# Patient Record
Sex: Female | Born: 1955
Health system: Northeastern US, Community
[De-identification: ages and names within clinical notes are randomized; demographics above are authoritative.]

## PROBLEM LIST (undated history)

## (undated) VITALS — BP 172/88 | HR 72 | Temp 97.8°F | Resp 13

## (undated) DIAGNOSIS — R922 Inconclusive mammogram: Secondary | ICD-10-CM

## (undated) DIAGNOSIS — J984 Other disorders of lung: Secondary | ICD-10-CM

## (undated) DIAGNOSIS — N912 Amenorrhea, unspecified: Secondary | ICD-10-CM

## (undated) DIAGNOSIS — E559 Vitamin D deficiency, unspecified: Secondary | ICD-10-CM

## (undated) DIAGNOSIS — Z136 Encounter for screening for cardiovascular disorders: Secondary | ICD-10-CM

## (undated) DIAGNOSIS — G4700 Insomnia, unspecified: Secondary | ICD-10-CM

## (undated) DIAGNOSIS — E669 Obesity, unspecified: Secondary | ICD-10-CM

## (undated) DIAGNOSIS — R197 Diarrhea, unspecified: Secondary | ICD-10-CM

## (undated) DIAGNOSIS — R195 Other fecal abnormalities: Secondary | ICD-10-CM

## (undated) DIAGNOSIS — F330 Major depressive disorder, recurrent, mild: Secondary | ICD-10-CM

## (undated) DIAGNOSIS — R05 Cough: Secondary | ICD-10-CM

## (undated) DIAGNOSIS — Z87891 Personal history of nicotine dependence: Secondary | ICD-10-CM

## (undated) DIAGNOSIS — K2970 Gastritis, unspecified, without bleeding: Secondary | ICD-10-CM

## (undated) DIAGNOSIS — Z9119 Patient's noncompliance with other medical treatment and regimen: Secondary | ICD-10-CM

## (undated) DIAGNOSIS — M545 Low back pain: Secondary | ICD-10-CM

## (undated) DIAGNOSIS — F341 Dysthymic disorder: Secondary | ICD-10-CM

## (undated) HISTORY — DX: Personal history of nicotine dependence: Z87.891

## (undated) HISTORY — DX: Major depressive disorder, recurrent, mild: F33.0

## (undated) HISTORY — DX: Low back pain: M54.5

## (undated) HISTORY — PX: TONSILLECTOMY ONE-HALF <AGE 12: ENT170

## (undated) HISTORY — DX: Cough: R05

## (undated) HISTORY — DX: Inconclusive mammogram: R92.2

## (undated) HISTORY — DX: Other fecal abnormalities: R19.5

## (undated) HISTORY — DX: Diarrhea, unspecified: R19.7

## (undated) HISTORY — DX: Dysthymic disorder: F34.1

## (undated) HISTORY — DX: Insomnia, unspecified: G47.00

## (undated) HISTORY — DX: Amenorrhea, unspecified: N91.2

## (undated) HISTORY — DX: Encounter for screening for cardiovascular disorders: Z13.6

## (undated) HISTORY — DX: Gastritis, unspecified, without bleeding: K29.70

## (undated) HISTORY — DX: Other disorders of lung: J98.4

## (undated) HISTORY — DX: Vitamin D deficiency, unspecified: E55.9

## (undated) HISTORY — DX: Patient's noncompliance with other medical treatment and regimen: Z91.19

## (undated) HISTORY — DX: Obesity, unspecified: E66.9

---

## 1999-04-18 HISTORY — PX: SALPINGO-OOPHORECTOMY COMPL/PRTL UNI/BI SPX: REP215

## 1999-04-18 HISTORY — PX: TOTAL ABDOMINAL HYSTERECT W/WO RMVL TUBE OVARY: REP152

## 2001-07-31 ENCOUNTER — Ambulatory Visit: Payer: Self-pay | Admitting: Internal Medicine

## 2001-08-01 LAB — ~~LOC~~-PROGRESS/CLIN NOTE

## 2001-08-06 ENCOUNTER — Other Ambulatory Visit: Payer: Self-pay | Admitting: Internal Medicine

## 2002-02-10 ENCOUNTER — Emergency Department (HOSPITAL_BASED_OUTPATIENT_CLINIC_OR_DEPARTMENT_OTHER): Payer: Self-pay | Admitting: Emergency Medicine

## 2002-02-10 DIAGNOSIS — M545 Low back pain, unspecified: Secondary | ICD-10-CM

## 2002-04-29 ENCOUNTER — Ambulatory Visit: Payer: Self-pay | Admitting: Internal Medicine

## 2002-04-30 ENCOUNTER — Ambulatory Visit: Payer: Self-pay | Admitting: Internal Medicine

## 2002-04-30 ENCOUNTER — Other Ambulatory Visit: Payer: Self-pay | Admitting: Internal Medicine

## 2002-04-30 LAB — ~~LOC~~-PROGRESS/CLIN NOTE

## 2002-05-26 ENCOUNTER — Ambulatory Visit (HOSPITAL_BASED_OUTPATIENT_CLINIC_OR_DEPARTMENT_OTHER): Payer: Self-pay | Admitting: Ophthalmology

## 2002-06-09 ENCOUNTER — Ambulatory Visit: Payer: Self-pay | Admitting: Internal Medicine

## 2002-06-09 LAB — CYTOPATH, C/V, THIN LAYER

## 2002-07-23 ENCOUNTER — Encounter: Payer: Self-pay | Admitting: Internal Medicine

## 2002-09-22 ENCOUNTER — Other Ambulatory Visit: Payer: Self-pay | Admitting: Internal Medicine

## 2002-09-22 DIAGNOSIS — Z1231 Encounter for screening mammogram for malignant neoplasm of breast: Secondary | ICD-10-CM

## 2002-09-22 DIAGNOSIS — R928 Other abnormal and inconclusive findings on diagnostic imaging of breast: Secondary | ICD-10-CM

## 2002-09-24 LAB — MA SCREENING MAMMO BILATERAL WITH CAD

## 2002-10-17 ENCOUNTER — Other Ambulatory Visit: Payer: Self-pay | Admitting: Internal Medicine

## 2002-10-17 DIAGNOSIS — Z09 Encounter for follow-up examination after completed treatment for conditions other than malignant neoplasm: Secondary | ICD-10-CM

## 2002-10-17 DIAGNOSIS — R928 Other abnormal and inconclusive findings on diagnostic imaging of breast: Secondary | ICD-10-CM

## 2002-10-20 LAB — MA DIAGNOSTIC MAMMO UNILATERAL RIGHT WITH CAD

## 2002-12-22 ENCOUNTER — Ambulatory Visit: Payer: Self-pay | Admitting: Internal Medicine

## 2003-01-09 ENCOUNTER — Other Ambulatory Visit: Payer: Self-pay | Admitting: Internal Medicine

## 2003-01-09 LAB — POTASSIUM: POTASSIUM: 3.5 mEQ/L (ref 3.5–5.0)

## 2003-01-09 LAB — CHG CREATININE BLOOD: CREATININE: 0.6 mg/dl — ABNORMAL LOW (ref 0.8–1.2)

## 2003-01-12 ENCOUNTER — Ambulatory Visit: Payer: Self-pay | Admitting: Internal Medicine

## 2003-01-22 ENCOUNTER — Emergency Department (HOSPITAL_BASED_OUTPATIENT_CLINIC_OR_DEPARTMENT_OTHER): Payer: Self-pay | Admitting: Emergency Medicine

## 2003-01-24 LAB — BASIC METABOLIC PANEL FASTING
BUN (UREA NITROGEN): 11 mg/dl (ref 10–20)
CALCIUM: 9.7 mg/dl (ref 8.5–10.5)
CARBON DIOXIDE: 31 mEQ/L (ref 22–32)
CHLORIDE: 106 mEQ/L (ref 98–110)
CREATININE: 0.7 mg/dl — ABNORMAL LOW (ref 0.8–1.2)
GLUCOSE FASTING: 88 mg/dl (ref 65–110)
POTASSIUM: 4.8 mEQ/L (ref 3.5–5.0)
SODIUM: 142 mEQ/L (ref 135–145)

## 2003-01-24 LAB — URINALYSIS
BILIRUBIN, URINE: NEGATIVE
GLUCOSE, URINE: NEGATIVE MG/DL
KETONE, URINE: NEGATIVE MG/DL
LEUKOCYTE ESTERASE: NEGATIVE
NITRITE, URINE: NEGATIVE
OCCULT BLOOD, URINE: NEGATIVE
PH URINE: 6 (ref 5.0–8.0)
PROTEIN, URINE: NEGATIVE MG/DL
SPECIFIC GRAVITY URINE: 1.025 (ref 1.003–1.035)

## 2003-01-24 LAB — COMPLETE BLOOD COUNT
HEMATOCRIT: 36.3 % — ABNORMAL LOW (ref 37.0–47.0)
HEMOGLOBIN: 11.9 g/dL — ABNORMAL LOW (ref 12.0–16.0)
MEAN CORP HGB CONC: 32.9 g/dL (ref 32.0–36.0)
MEAN CORPUSCULAR HGB: 27.2 pg (ref 27.0–31.0)
MEAN CORPUSCULAR VOL: 82.9 fL (ref 81.0–99.0)
RBC DISTRIBUTION WIDTH: 13.5 % (ref 11.5–14.3)
RED BLOOD CELL COUNT: 4.38 MIL/uL (ref 4.20–5.40)
WHITE BLOOD CELL COUNT: 5.1 10*3/uL (ref 4.8–10.8)

## 2003-01-24 LAB — THYROID SCREEN TSH REFLEX FT4: THYROID SCREEN TSH REFLEX FT4: 1.33 u[IU]/mL (ref 0.34–5.60)

## 2003-01-24 LAB — HEMATOCRIT: HEMATOCRIT: 37.5 % (ref 37.0–47.0)

## 2003-01-24 LAB — CHG LIPID PANEL
Cholesterol: 189 mg/dl (ref ?–200)
HIGH DENSITY LIPOPROTEIN: 43 mg/dl (ref 35–95)
LOW DENSITY LIPOPROTEIN DIRECT: 121 mg/dl (ref ?–130)
RISK FACTOR: 4.4 (ref ?–4.4)
TRIGLYCERIDES: 87 mg/dl (ref 30–200)

## 2003-01-24 LAB — CHG ASSAY OF FERRITIN: FERRITIN: 69 ng/ml (ref 11–307)

## 2003-01-27 LAB — CHG CREATININE BLOOD: CREATININE: 0.6 mg/dl — ABNORMAL LOW (ref 0.8–1.2)

## 2003-01-27 LAB — POTASSIUM: POTASSIUM: 3.5 mEQ/L (ref 3.5–5.0)

## 2003-02-09 ENCOUNTER — Ambulatory Visit: Payer: Self-pay | Admitting: Internal Medicine

## 2003-02-09 LAB — CHG CREATININE BLOOD: CREATININE: 0.7 mg/dl — ABNORMAL LOW (ref 0.8–1.2)

## 2003-02-09 LAB — POTASSIUM: POTASSIUM: 4.6 mEQ/L (ref 3.5–5.0)

## 2003-04-15 ENCOUNTER — Ambulatory Visit (HOSPITAL_BASED_OUTPATIENT_CLINIC_OR_DEPARTMENT_OTHER): Payer: Self-pay | Admitting: Ophthalmology

## 2004-02-01 ENCOUNTER — Ambulatory Visit: Payer: Self-pay | Admitting: Internal Medicine

## 2004-02-05 LAB — CHG CREATININE BLOOD: CREATININE: 0.5 mg/dl — ABNORMAL LOW (ref 0.8–1.2)

## 2004-02-05 LAB — POTASSIUM: POTASSIUM: 4.6 mEQ/L (ref 3.5–5.0)

## 2004-02-17 ENCOUNTER — Ambulatory Visit (HOSPITAL_BASED_OUTPATIENT_CLINIC_OR_DEPARTMENT_OTHER): Payer: Self-pay | Admitting: Internal Medicine

## 2004-03-04 ENCOUNTER — Encounter: Payer: Self-pay | Admitting: Ophthalmology

## 2004-08-03 ENCOUNTER — Ambulatory Visit: Payer: Self-pay | Admitting: Internal Medicine

## 2004-08-03 ENCOUNTER — Encounter: Payer: Self-pay | Admitting: Internal Medicine

## 2004-08-05 ENCOUNTER — Encounter (HOSPITAL_BASED_OUTPATIENT_CLINIC_OR_DEPARTMENT_OTHER): Payer: Self-pay | Admitting: Internal Medicine

## 2004-08-05 DIAGNOSIS — F330 Major depressive disorder, recurrent, mild: Secondary | ICD-10-CM | POA: Insufficient documentation

## 2004-08-05 DIAGNOSIS — F17200 Nicotine dependence, unspecified, uncomplicated: Secondary | ICD-10-CM | POA: Insufficient documentation

## 2004-08-05 DIAGNOSIS — Z136 Encounter for screening for cardiovascular disorders: Secondary | ICD-10-CM

## 2004-08-05 DIAGNOSIS — M545 Low back pain, unspecified: Secondary | ICD-10-CM | POA: Insufficient documentation

## 2004-08-05 DIAGNOSIS — Z87891 Personal history of nicotine dependence: Secondary | ICD-10-CM

## 2004-08-05 DIAGNOSIS — E669 Obesity, unspecified: Secondary | ICD-10-CM

## 2004-08-05 DIAGNOSIS — Z91199 Patient's noncompliance with other medical treatment and regimen due to unspecified reason: Secondary | ICD-10-CM

## 2004-08-05 DIAGNOSIS — Z9119 Patient's noncompliance with other medical treatment and regimen: Secondary | ICD-10-CM

## 2004-08-05 DIAGNOSIS — Z9884 Bariatric surgery status: Secondary | ICD-10-CM | POA: Insufficient documentation

## 2004-08-05 DIAGNOSIS — F341 Dysthymic disorder: Secondary | ICD-10-CM

## 2004-08-05 HISTORY — DX: Patient's noncompliance with other medical treatment and regimen due to unspecified reason: Z91.199

## 2004-08-05 HISTORY — DX: Dysthymic disorder: F34.1

## 2004-08-05 HISTORY — DX: Low back pain, unspecified: M54.50

## 2004-08-05 HISTORY — DX: Encounter for screening for cardiovascular disorders: Z13.6

## 2004-08-05 HISTORY — DX: Obesity, unspecified: E66.9

## 2004-08-05 HISTORY — DX: Personal history of nicotine dependence: Z87.891

## 2004-08-05 HISTORY — DX: Major depressive disorder, recurrent, mild: F33.0

## 2004-08-08 ENCOUNTER — Ambulatory Visit: Payer: Self-pay | Admitting: Internal Medicine

## 2004-08-09 ENCOUNTER — Ambulatory Visit (HOSPITAL_BASED_OUTPATIENT_CLINIC_OR_DEPARTMENT_OTHER): Payer: Self-pay | Admitting: Internal Medicine

## 2004-08-09 ENCOUNTER — Other Ambulatory Visit: Payer: Self-pay | Admitting: Internal Medicine

## 2004-08-09 DIAGNOSIS — Z13220 Encounter for screening for lipoid disorders: Secondary | ICD-10-CM

## 2004-08-09 LAB — HEMATOCRIT: HEMATOCRIT: 39.1 % (ref 37.0–47.0)

## 2004-08-09 LAB — GLUCOSE FASTING: GLUCOSE FASTING: 74 mg/dl (ref 74–118)

## 2004-08-09 LAB — CHG LIPID PANEL
Cholesterol: 200 mg/dl (ref 0–200)
HIGH DENSITY LIPOPROTEIN: 50 mg/dl (ref 35–85)
LOW DENSITY LIPOPROTEIN DIRECT: 138 mg/dl — ABNORMAL HIGH (ref 0–100)
RISK FACTOR: 4 (ref ?–4.4)
TRIGLYCERIDES: 70 mg/dl (ref 0–150)

## 2004-09-13 ENCOUNTER — Encounter: Payer: Self-pay | Admitting: Internal Medicine

## 2004-09-30 ENCOUNTER — Encounter (HOSPITAL_BASED_OUTPATIENT_CLINIC_OR_DEPARTMENT_OTHER): Payer: PRIVATE HEALTH INSURANCE | Admitting: Dermatology

## 2004-12-18 HISTORY — PX: PARATHYROIDECTOMY: SHX19

## 2004-12-20 ENCOUNTER — Emergency Department (HOSPITAL_BASED_OUTPATIENT_CLINIC_OR_DEPARTMENT_OTHER): Payer: Self-pay | Admitting: Emergency Medicine

## 2004-12-20 LAB — BLOOD COUNT COMPLETE AUTO&AUTO DIFRNTL WBC
BASOPHIL %: 0.9 % (ref 0–2)
EOSINOPHIL %: 2.3 % (ref 0–7)
HEMATOCRIT: 40.3 % (ref 37.0–47.0)
HEMOGLOBIN: 13.3 g/dL (ref 12.0–16.0)
LYMPHOCYTE %: 38 % (ref 12–39)
MEAN CORP HGB CONC: 33 g/dL (ref 32.0–36.0)
MEAN CORPUSCULAR HGB: 27.2 pg (ref 27.0–31.0)
MEAN CORPUSCULAR VOL: 82.5 fL (ref 81.0–99.0)
MEAN PLATELET VOLUME: 7.6 fL (ref 6.4–10.8)
MONOCYTE %: 7 % (ref 1–12)
NEUTROPHIL %: 51.8 % (ref 46–79)
PLATELET COUNT: 315 10*3/uL (ref 150–400)
RBC DISTRIBUTION WIDTH: 12.7 % (ref 11.5–14.3)
RED BLOOD CELL COUNT: 4.89 MIL/uL (ref 4.20–5.40)
WHITE BLOOD CELL COUNT: 5.6 10*3/uL (ref 4.8–10.8)

## 2004-12-20 LAB — BASIC METABOLIC PANEL
BUN (UREA NITROGEN): 13 mg/dl (ref 6–20)
CALCIUM: 9.8 mg/dl (ref 8.6–10.0)
CARBON DIOXIDE: 27 mmol/L (ref 22–32)
CHLORIDE: 103 mmol/L (ref 101–111)
CREATININE: 0.7 mg/dl (ref 0.4–1.2)
Glucose Random: 93 mg/dl (ref 74–160)
POTASSIUM: 4.2 mmol/L (ref 3.5–5.1)
SODIUM: 138 mmol/L (ref 135–144)

## 2004-12-26 ENCOUNTER — Ambulatory Visit (HOSPITAL_BASED_OUTPATIENT_CLINIC_OR_DEPARTMENT_OTHER): Payer: Self-pay

## 2004-12-26 ENCOUNTER — Encounter (HOSPITAL_BASED_OUTPATIENT_CLINIC_OR_DEPARTMENT_OTHER): Payer: Self-pay | Admitting: Urology

## 2005-01-09 ENCOUNTER — Ambulatory Visit (HOSPITAL_BASED_OUTPATIENT_CLINIC_OR_DEPARTMENT_OTHER): Payer: Self-pay

## 2005-01-10 LAB — XR ABDOMEN (KUB) 1 VIEW

## 2005-01-30 ENCOUNTER — Encounter (HOSPITAL_BASED_OUTPATIENT_CLINIC_OR_DEPARTMENT_OTHER): Payer: Self-pay

## 2005-02-21 ENCOUNTER — Encounter (HOSPITAL_BASED_OUTPATIENT_CLINIC_OR_DEPARTMENT_OTHER): Payer: Self-pay | Admitting: Internal Medicine

## 2005-02-21 DIAGNOSIS — N200 Calculus of kidney: Secondary | ICD-10-CM | POA: Insufficient documentation

## 2005-03-08 ENCOUNTER — Encounter (HOSPITAL_BASED_OUTPATIENT_CLINIC_OR_DEPARTMENT_OTHER): Payer: Self-pay | Admitting: Internal Medicine

## 2005-03-08 ENCOUNTER — Ambulatory Visit (HOSPITAL_BASED_OUTPATIENT_CLINIC_OR_DEPARTMENT_OTHER): Payer: Charity | Admitting: Internal Medicine

## 2005-03-08 VITALS — BP 130/82 | HR 62 | Temp 98.7°F | Ht 63.5 in | Wt 197.0 lb

## 2005-03-08 DIAGNOSIS — F1510 Other stimulant abuse, uncomplicated: Secondary | ICD-10-CM

## 2005-03-08 DIAGNOSIS — F17200 Nicotine dependence, unspecified, uncomplicated: Secondary | ICD-10-CM

## 2005-03-08 DIAGNOSIS — Z0000 Encounter for general adult medical examination without abnormal findings: Secondary | ICD-10-CM

## 2005-03-08 DIAGNOSIS — E669 Obesity, unspecified: Secondary | ICD-10-CM

## 2005-03-08 DIAGNOSIS — G4700 Insomnia, unspecified: Secondary | ICD-10-CM

## 2005-03-08 LAB — SERUM DRUG SCREEN 6 DRUGS
ACETAMINOPHEN: <10 ug/ml — ABNORMAL LOW (ref 10–30)
BARBITURATE: NEGATIVE
BENZODIAZEPINE: POSITIVE — AB
ETHANOL: <10 mg/dl (ref 0–?)
SALICYLATE: <4 mg/dl — ABNORMAL LOW (ref 4–29)
TRICYCLIC ANTIDEPRESSANTS: NEGATIVE

## 2005-03-08 LAB — CHG LIPOPROTEIN DIRECT MEASUREMENT LDL CHOLESTEROL: LOW DENSITY LIPOPROTEIN DIRECT: 115 mg/dl — ABNORMAL HIGH (ref 0–100)

## 2005-03-08 LAB — THYROID SCREEN TSH REFLEX FT4: THYROID SCREEN TSH REFLEX FT4: 1.75 u[IU]/mL (ref 0.34–5.60)

## 2005-03-08 LAB — CHG LIPOPROTEIN DIR MEAS HIGH DENSITY CHOLESTEROL: HIGH DENSITY LIPOPROTEIN: 49 mg/dl (ref 35–85)

## 2005-03-08 LAB — CHOLESTEROL SERUM/WHOLE BLOOD TOTAL: Cholesterol: 176 mg/dl (ref 0–200)

## 2005-03-08 MED ORDER — CHLORHEXIDINE GLUCONATE 0.12 % MT SOLN
OROMUCOSAL | Status: DC
Start: 2005-03-08 — End: 2006-03-12

## 2005-03-08 NOTE — Progress Notes (Signed)
Cc: discuss diet pill from [place] & complete PE    Diet pill from [place]  Began 2 weeks ago taking a combination diet pill b/c she's frustrated w/ her inability to lose wt. Takes one tab qday  She has noted difficutly sleeping since starting the pill  She heard about it from a Br friend & buys the pills ($75/month) from [employer]  She is happy about this b/c she thinking she has already started to lose weight  In the past she has tried to diet, never seen the nutritionist. Has not tried to exercise to lose wt.      Patient Active Problem List:   CALCULUS OF KIDNEY [592.0]   Date Noted: 02/21/2005   Comment: Seen in ED 1/06 & had L ureteral stone   SCREENING FOR HYPERTENSION [V81.1]   Date Noted: 08/05/2004   Comment: occ elevated BP started on anti-HTN in    [place]; chem 7, EKG, HCT u/a wnl 4/03;    d/c'ed HCTZ 8/05 when we learned from    daughter she has not been taking it   ANXIETY DEPRESSION [300.4]   Date Noted: 08/05/2004   Comment: TSH wnl 8/02; trial of fluoxetine 8/05   PERS HX TOBACCO USE [V15.82]   Date Noted: 08/05/2004   Comment: quit 10/02; 1/2 PPD since 49 YO   OBESITY NOS [278.00]   Date Noted: 08/05/2004   Comment: has tried meds from [place]   PERS HX OF PAST NONCOMPLIANCE [V15.81]   Date Noted: 08/05/2004   Comment: poor compliance to med regimen    Meds: combination diet pill from [place], altho' she could not provide hers today these generally incl. Amphetamines, diuretics, benzo's +/- levothyroxine     Review of Patient's Allergies indicates:   Nkda (no known drug*   Past Medical History:   SCREENING FOR HYPERTENSION 08/05/2004   Comment: occ elevated BP started on anti-HTN in [place];    chem 7, EKG, HCT u/a wnl 4/03; d/c'ed HCTZ 8/05   when we learned from daughter she has not been    taking it   ANXIETY DEPRESSION 08/05/2004   Comment: TSH wnl 8/02; trial of fluoxetine 8/05   PERS HX TOBACCO USE 08/05/2004   Comment: quit 10/02; 1/2 PPD since 49 YO   BACK PAIN LOW 08/05/2004   Comment:  LBP musculoskeletal most likely 1/04   OBESITY NOS 08/05/2004   Comment: has tried meds from [place]   PERS HX OF PAST NONCOMPLIANCE 08/05/2004   Comment: poor compliance to med regimen  Past Surgical History:   TOTAL HYSTERECTOMY 5/00    Comment: Hysterectomy, Total Abdominal secondary to    fibroids   REMOVAL OF OVARY/TUBE(S) 5/00    Comment: Salpingo-Oophorectomy, bilateral  Social History   Marital Status: Legally Separated Spouse Name:    Years of Education: Number of children:     Social History Main Topics   Tobacco Use: Yes Packs/Day: .5 Years: 40    Alcohol Use: No    Drug Use: No   Social History Narrative   work: Land; owned Brewing technologist in [place]   Lives w/ daughter 24 YO (Grazielle Scipio) & son-in-law Kern [place])   Divorced; currently not in a relationship      Review of patient's family history indicates:   Cancer - Lung Father    Comment: died from lung CA   Hypertension Mother    Lipids Mother    Comment: hyperlipidemia   Heart Maternal Uncle    Comment: died 19  from MI   Diabetes Maternal Aunt      Review of symptoms:     No fevers or unexplained weight loss. No visual changes. No sore throat or ear ache. No prolonged cough, but has intermittent dry cough which is not present now, not assoc w/ any other sym. No dyspnea or chest pain on exertion. No abdominal pain or change in bowel habits. No vaginal discharge or dysuria. No new or unusual musculoskeletal symptoms. No new rashes or skin changes. No pain or masses in breasts. No paresthesias or unusual headaches. No sadness or anxiety that interferes with day-to-day activities. No hot flashes. No enlarged nodes. No new itching, sneezing or wheezing. +bad breath.     BP 130/82   Pulse 62   Temp 98.7   Ht 5' 3.5" (1.30m)   Wt 197 lbs (89.4kg)    General:Patient appears well, alert and oriented x 3, pleasant, cooperative.   Eyes: anicteric conjunctiva, PERLA bilaterally.   Ears: tm's wnl bilaterally without erythema or exudate.   Oropharynx: no  erethyma or exudate.   Neck supple and free of adenopathy, or masses. No thyromegaly.  Lymph: no enlargement of anterior cervical, posterior cervical or supraclavicular lymph nodes   Breasts are symmetric. No dominant, discrete, fixed or suspicious masses are noted. minimal symmetric fibrocystic densities are noted bilaterally. No skin or nipple changes or axillary nodes.   Cardiovascular: regular rate & rhythm, no murmurs, gallops or rubs appreciated.   Chest: clear to auscultation bilaterally, no crackles, rhonchi or wheezes.   Abdomen is obese, soft, no tenderness, masses or organomegaly. Extremities: no clubbing, cyanosis or edema. Peripheral pulses, DP & TP are normal bilaterally.   Screening neurological exam is normal without focal findings. Skin is normal without suspicious lesions noted.  Mental status exam: Normal thought content, speech, affect, mood and dress are noted.  Musculoskeletal exam: within normal limits without joint swelling or deformities     ASSESSMENT & PLAN:  V70.0 ROUTINE MEDICAL EXAM (primary encounter diagnosis)  Note: check mammo; no need for Pap given s/p hyst; will give new mouthwash for halitosis  Plan: CHLORHEXIDINE GLUCONATE 0.12 % MT SOLN, ASSAY,    BLD/SERUM CHOLESTEROL, HIGH DENSITY    LIPOPROTEIN, LOW DENISITY LIPOPROTEIN, DIRECT       278.00 OBESITY NOS  Note: discussed in detail a dieting approach the focus on keeping a food diary, measuring all foods, looking up the amount of fats and keeping total daily fat low-- gave patient instructions regarding this diet to her in [place]  strongly discouraged her from taking the combination diet pill from [place]   I have also offered the use of orlistat after she sees RD and works to minimize her daily fat intake  Plan: REFERRAL TO NUTRITION (INT)   Maintain a food diary & bring to RD apt    305.1 TOBACCO USE DISORDER  Note: strongly encouraged her to quit  Pt currently precontemplative    305.71 AMPHETAMINE ABUSE-CONTIN  Note:  likely a component of her diet pill  Plan: COMPLETE TOXIC ANAL SERUM (SENDOUT)   I've asked her to bring in extra pills  She seems willing to stop pills today    780.52 INSOMNIA NOS  Note: may be the amphetamine in the pills and/or levothyroxine will check:THYROID SCREEN TSH

## 2005-03-08 NOTE — Progress Notes (Addendum)
Quick Note:    patient called & informed of results by Renold Don : "I received her lab results, there are some abnormalities that are directly related to the pills she was taking. It is very important that she stop these immediately."  ______

## 2005-03-09 NOTE — Progress Notes (Addendum)
Addended by: Gertie Baron on: 03/09/2005 11:44:02 AM     Modules accepted: Orders

## 2005-03-10 ENCOUNTER — Ambulatory Visit (HOSPITAL_BASED_OUTPATIENT_CLINIC_OR_DEPARTMENT_OTHER): Payer: Self-pay | Admitting: Internal Medicine

## 2005-03-27 ENCOUNTER — Ambulatory Visit (HOSPITAL_BASED_OUTPATIENT_CLINIC_OR_DEPARTMENT_OTHER): Payer: Charity | Admitting: Registered"

## 2005-03-27 VITALS — BP 150/100 | Ht 65.35 in | Wt 197.0 lb

## 2005-03-27 DIAGNOSIS — E669 Obesity, unspecified: Secondary | ICD-10-CM

## 2005-03-27 NOTE — Progress Notes (Signed)
INITIAL NUTRITION ASSESSMENT / INTERVENTION     Total Minutes: 45 minutes      Mariah [name] is a 49 year old female who presents with HTN and obesity. Visit via [place], [place]. The patient is from [place]. Patient's language is [place]; has [place] ethnic background. She has been in the [place] for 4 years. 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 Patient lives with adult children.     Recent dietary changes reported by the patient: stopped taking diet pills from [place].    Review of patient's histories:   Nutrition Referral  Problem List  Most Recent Encounter  Medical History      Review of patient's family history indicates:   Cancer - Lung Father    Comment: died from lung CA   Hypertension Mother    Lipids Mother    Comment: hyperlipidemia   Heart Maternal Uncle    Comment: died 1 from MI   Diabetes Maternal Aunt    Non-contributory    Comment: no first deg rel. w/ breast ca        Patient has had HTN for >10 years and has not had Medical Nutrition Therapy.    Current outpatient prescriptions:  CHLORHEXIDINE GLUCONATE 0.12 % MT SOLN, GARGLE BID PRN, Disp: 1 BOTTLE, Rfl: 2      Vitamins/Minerals: no    Herbal Supplements: no     Tobacco Use: Yes Packs/Day: .5 Years: 40    Alcohol Use: No       Review of Patient's Allergies indicates:  Nkda (no known drug*     Labs:  HGB 13.3 12/20/2004  HCT 40.3 12/20/2004  FERRITIN 69 04/30/2002  CHOLESTEROL 176 03/08/2005  LDL 115 03/08/2005  HDL 49 03/08/2005  TG 70 08/09/2004  No results found for this basename: insulin:1  No results found for this basename: TESTOSTOTAL:1  GLUCOSEF 74 08/09/2004  GLUCOSER 93 12/20/2004  No results found for this basename: FINGERSTICKR:1  No results found for this basename: FINGERSTICKf:1  SPEGRAVURINE 1.025 04/30/2002    Vitals:  Most Recent BP Reading(s)   Date: BP:   03/27/2005 150/100     Most Recent Pulse Reading(s)   Date: Pulse:   03/08/2005 62     Most Recent Temp Reading(s)   Date: Temp:  Temp Src:   03/08/2005 98.7       Most Recent Height Reading(s)   Date: Ht:   03/27/2005 5' 5.35"     Most Recent Weight Reading(s)   Date: Wt:   03/27/2005 197 lbs (89.359 kg)    Body mass index is 32.43 kg/(m^2).   BMI Category: 30-34.9 obesity class I    Highest weight: 203 lbs   Lowest weight: 180 lbs   Desired weight: 180 lbs     Physical Activity:  Type: housework     Barriers to physical activity include: physical    Activities of Daily Living: independent     Chewing ability: NL Swallowing ability: NL Appetite: hungry at night    Food Allergies: NKFA  Food Intolerances: none   Food Aversions: none  Religious restrictions / Special diet: none  Food purchased by: pt and family   Food prepared by: pt  Meal sources: home cooked, take out / restaurant, 1 times per week and convenience foods (fruit, gatorade)  Economic factors and food assist: none  Psychosocial factors / Mental Status: good  Stress: a little bit  Readiness to learn: good    Dietary history /  usual intake and Food frequency reveals patient follows no restrictions in diet. She eats 2-3 meals and 0 snacks per day.    Food intake:   deficient foods: deficient intake of dairy, fruits, whole grains, vegetables and monosaturated fats  excessive intake of fruit juice, junk food, starch / carbohydrates and sweetened beverages      Portion size is excessive.  Eating frequency is deficient  Eating habits are culturally based [place]    Estimated intake shows:   deficient nutrients: monosaturated fats, calcium, vitamin C, folate and vitamin B6  excessive nutrients: calories, fat, saturated fat, trans fats, cholesterol, added sugar, fluid calories and low nutrient density foods (junk foods)      Discussed with patient the diet pills and why they were unhealthy. Explained the role of diet and exercise in weight loss and why this is a healthier method than pills or surgery. Currently eating excessive amounts of chips, rice and sweetened beverages as well as  canned soups for lunch. Reviewed importance of limiting salt and canned foods.     Client's goals: 1) breakfast of bread or cereal 2) plate method 3) walk    Material provided: tips for weight loss    Future educational needs: as needed    Referred to: none    Follow-up: 1 months.    Lajean Manes, MS, RD, LDN

## 2005-03-28 ENCOUNTER — Ambulatory Visit (HOSPITAL_BASED_OUTPATIENT_CLINIC_OR_DEPARTMENT_OTHER): Payer: Charity | Admitting: Registered"

## 2005-03-28 VITALS — BP 130/80 | Temp 98.2°F | Wt 148.0 lb

## 2005-03-28 DIAGNOSIS — E669 Obesity, unspecified: Secondary | ICD-10-CM

## 2005-03-30 NOTE — Progress Notes (Signed)
Due to scheduling error, pt needed to come back today for recall to be completed but full note done in initial assessment 03/27/05.    Lajean Manes, MS, RD, LDN

## 2005-04-13 ENCOUNTER — Other Ambulatory Visit: Payer: Self-pay | Admitting: Internal Medicine

## 2005-04-13 DIAGNOSIS — Z1231 Encounter for screening mammogram for malignant neoplasm of breast: Secondary | ICD-10-CM

## 2005-04-21 LAB — MA SCREENING MAMMO BILATERAL WITH CAD

## 2005-04-24 ENCOUNTER — Encounter (HOSPITAL_BASED_OUTPATIENT_CLINIC_OR_DEPARTMENT_OTHER): Payer: Self-pay | Admitting: Registered"

## 2005-05-31 ENCOUNTER — Ambulatory Visit (HOSPITAL_BASED_OUTPATIENT_CLINIC_OR_DEPARTMENT_OTHER): Payer: Charity | Admitting: Internal Medicine

## 2005-05-31 VITALS — Temp 98.4°F | Ht 65.0 in | Wt 200.0 lb

## 2005-05-31 DIAGNOSIS — M19079 Primary osteoarthritis, unspecified ankle and foot: Secondary | ICD-10-CM

## 2005-05-31 DIAGNOSIS — F330 Major depressive disorder, recurrent, mild: Secondary | ICD-10-CM

## 2005-05-31 MED ORDER — CITALOPRAM HYDROBROMIDE 20 MG PO TABS
ORAL_TABLET | ORAL | Status: DC
Start: 2005-05-31 — End: 2006-03-12

## 2005-05-31 NOTE — Progress Notes (Signed)
ZO:XWRU of motivation & ankle pain    LACK OF MOTIVATION  She reports 5 months of loss of interest as well as the following symptoms: persistent change in appetite-->increased, insomnia and diminished concentration and decisiveness  She denies thoughts of death, suicidal thoughts or suicide attempts   She reports the following current stressors: none  She denies the following symptoms lasting throughout at least four days: being unusually happy, decreased need for sleep, pressure to keep talking, racing thoughts, buying sprees or unusual sexual behavior.  She reports 1 previous episodes of anxiety, took fluoxetine x several weeks & does not recall perceptable improvement       ANKLE PAIN  Pain in right ankle especially at the end of a long day  Does not recall any trauma to ankle  No giving out  Has noted a 'bump' on lateral aspect of right ankle    ROS: No fevers or unexplained weight loss. No new headaches. No shortness of breath or chest pain. No abdominal pain.     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 ? Soft-tissue mass on lateral malleolus  MMSE: well groomed, affect normal, no tangential thoughts, no pressured speech, no agitation or psychomotor slowing, normal response-time to questions     ASSESSMENT & PLAN:  716.97 ARTHROPATHY NOS-ANKLE (primary encounter diagnosis)  Note: unclear the etiology of her ankle pain  Plan: REFERRAL TO PODIATRY (INT)   recommended weight loss    296.31 RECURR DEPR PSYCHOS-MILD  Note: Given that she reports more than two weeks of loss of interest as well as three other symptoms (noted above) that are sufficient to cause clinically important psychologic  or physical distress or functional impairment, she does not quite meet diagnostic criteria for major depressive disorder or GAD; however, given that this have been present for almost 2 years she might have dysthymia. Although organic causes of these symptoms such as cancer, stroke, demyelinating disease and epilepsy were considered, these potential diagnoses are so remote given the clinical setting that I will not pursue them further. Given the diagnosis of dysthymia she is a candidate for both medication and therapy as treatment options. These two approaches have been discussed in detail. She has decided to start citalopram.  see EPIC orders for prescription details  counseled on slow onset of action -- likely 2-3 weeks until start of effect and likely 4-6 weeks until full effect  reviewed side profile including both somatic (HA's, abd pains, sexual dysfunction) & psychologic (insomnia, increase in anxiety and/or depression)   additional details of how to take the medicine were given in written patient education handout today  f/u scheduled in 6 weeks, but patient aware she should call if she has any questions prior to this appointment  she also understands to stop medicine if she has suicidal ideation and to call clinic immediately      Plan: CITALOPRAM HYDROBROMIDE 20 MG OR TABS   face-to-face 25 minutes with >13 minutes counseling

## 2005-07-03 ENCOUNTER — Other Ambulatory Visit (HOSPITAL_BASED_OUTPATIENT_CLINIC_OR_DEPARTMENT_OTHER): Payer: Charity

## 2005-07-03 DIAGNOSIS — G5750 Tarsal tunnel syndrome, unspecified lower limb: Secondary | ICD-10-CM

## 2005-07-03 DIAGNOSIS — M79609 Pain in unspecified limb: Secondary | ICD-10-CM

## 2005-07-03 LAB — XR FOOT LEFT MINIMUM 3 VIEWS

## 2005-07-03 LAB — XR ANKLE LEFT MINIMUM 3 VIEWS

## 2005-07-28 ENCOUNTER — Ambulatory Visit (HOSPITAL_BASED_OUTPATIENT_CLINIC_OR_DEPARTMENT_OTHER): Payer: Charity | Admitting: Internal Medicine

## 2005-07-28 VITALS — Temp 98.2°F | Wt 200.0 lb

## 2005-07-28 DIAGNOSIS — F17200 Nicotine dependence, unspecified, uncomplicated: Secondary | ICD-10-CM

## 2005-07-28 DIAGNOSIS — R05 Cough: Principal | ICD-10-CM

## 2005-07-28 DIAGNOSIS — R059 Cough, unspecified: Secondary | ICD-10-CM

## 2005-07-28 MED ORDER — FLONASE 50 MCG/DOSE NA INHA
NASAL | Status: DC
Start: 2005-07-28 — End: 2006-03-12

## 2005-07-28 NOTE — Progress Notes (Signed)
Cc: cough    Pt notes non-productive cough x >34yr  She notes that occasionally paroxsyms make her nausea and rarely to vomit.    She notes that she has an irritating feeling in the back of her throat.  The patient denies chest pain, dyspnea, wheezing or hemoptysis.   No chest burning or sour taste in mouth  She denies constitutional symptoms of fatigue, weakness, weight loss or gain, fevers, night sweats.     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 No significant cerumenosis noted.   Nasal exam; septum midline, no deformities, nares patent, normal mucosa without swelling, no polyps, no bleeding.   Throat: Oral cavity, tongue, pharynx and palate have no inflammation, exudates or ulcers.   Heart: S1 and S2 normal, no murmurs, clicks, gallops or rubs. Regular rate and rhythm.   Lungs: clear; no wheezes, rhonchi or rales.     ASSESSMENT & PLAN:  786.2 COUGH (primary encounter diagnosis)  Note: chronic cough; no suggestion of cancer but will check a CXR to rule out, especially since she's a smoker  More likey post-nasal drip; no evidence of asthma or GERD.    305.1 TOBACCO USE DISORDER  Note: counseled on importance of quitting; she is precontemplative

## 2005-08-07 ENCOUNTER — Ambulatory Visit (HOSPITAL_BASED_OUTPATIENT_CLINIC_OR_DEPARTMENT_OTHER): Payer: Charity

## 2005-08-07 DIAGNOSIS — M7750 Other enthesopathy of unspecified foot: Secondary | ICD-10-CM

## 2005-08-30 ENCOUNTER — Encounter (HOSPITAL_BASED_OUTPATIENT_CLINIC_OR_DEPARTMENT_OTHER): Payer: Self-pay | Admitting: Internal Medicine

## 2005-08-30 DIAGNOSIS — S93609A Unspecified sprain of unspecified foot, initial encounter: Secondary | ICD-10-CM | POA: Insufficient documentation

## 2005-09-29 ENCOUNTER — Ambulatory Visit (HOSPITAL_BASED_OUTPATIENT_CLINIC_OR_DEPARTMENT_OTHER): Payer: Charity | Admitting: Internal Medicine

## 2005-11-17 ENCOUNTER — Emergency Department: Payer: Self-pay

## 2005-11-17 LAB — XR NECK SOFT TISSUE

## 2005-11-17 LAB — XR CHEST 2 VIEWS

## 2005-11-19 LAB — THROAT CULTURE BETA STREP

## 2005-11-28 LAB — SURG SPEC CLINIC NOTE

## 2006-01-12 LAB — CT PELVIS W CONTRAST

## 2006-01-12 LAB — CT ABDOMEN W & WO IV CONTRAST

## 2006-02-06 ENCOUNTER — Emergency Department (HOSPITAL_BASED_OUTPATIENT_CLINIC_OR_DEPARTMENT_OTHER): Payer: Self-pay | Admitting: Emergency Medicine

## 2006-02-07 ENCOUNTER — Telehealth (HOSPITAL_BASED_OUTPATIENT_CLINIC_OR_DEPARTMENT_OTHER): Payer: Self-pay | Admitting: Ambulatory Care

## 2006-02-07 LAB — XR CHEST 2 VIEWS

## 2006-02-07 NOTE — Telephone Encounter (Signed)
Message left on vm to call triage line

## 2006-02-07 NOTE — Telephone Encounter (Signed)
Staff Message copied by Windell Moulding on 02/07/2006 at 12:55 PM  ------   Message from: Merrilee Jansky, RITA   Created: 02/07/2006 at 12:46 PM   Regarding: er fu high blood pressure    Contact: [phone]    Mariah [name] [id number], 50 year old, female, [phone] (home)     CALL BACK NUMBER:   Cell phone:   Other phone:    Available times:    Patient's language of care: [place]    Patient needs a portuguese interpreter.    Patient's PCP: Greer Ee, MD, MD    Person calling on behalf of patient: daughter    Calls today with a sick call.  Went to er and was told to fu with her pcp asap

## 2006-02-07 NOTE — Telephone Encounter (Signed)
Spoke with pt's daughter, seen in ER and told BP elevated but doesn't know reading. Was told to see pcp asap. Scheduled tomorrow with a covering provider.

## 2006-02-08 ENCOUNTER — Ambulatory Visit (HOSPITAL_BASED_OUTPATIENT_CLINIC_OR_DEPARTMENT_OTHER): Payer: Self-pay | Admitting: Internal Medicine

## 2006-02-08 ENCOUNTER — Ambulatory Visit (HOSPITAL_BASED_OUTPATIENT_CLINIC_OR_DEPARTMENT_OTHER): Payer: Charity | Admitting: Internal Medicine

## 2006-02-08 VITALS — BP 150/90 | HR 80 | Temp 97.9°F | Ht 64.0 in | Wt 199.0 lb

## 2006-02-08 DIAGNOSIS — R059 Cough, unspecified: Secondary | ICD-10-CM

## 2006-02-08 DIAGNOSIS — I119 Hypertensive heart disease without heart failure: Secondary | ICD-10-CM

## 2006-02-08 DIAGNOSIS — R05 Cough: Secondary | ICD-10-CM

## 2006-02-08 LAB — COMPREHENSIVE METABOLIC PANEL
ALANINE AMINOTRANSFERASE: 31 IU/L (ref 7–35)
ALBUMIN: 4 g/dl (ref 3.4–4.8)
ALKALINE PHOSPHATASE: 146 IU/L — ABNORMAL HIGH (ref 25–106)
ASPARTATE AMINOTRANSFERASE: 29 IU/L (ref 8–34)
BILIRUBIN TOTAL: 0.4 mg/dl (ref 0.2–1.1)
BUN (UREA NITROGEN): 11 mg/dl (ref 6–20)
CALCIUM: 9.9 mg/dl (ref 8.6–10.0)
CARBON DIOXIDE: 29 mmol/L (ref 22–32)
CHLORIDE: 102 mmol/L (ref 98–107)
CREATININE: 0.8 mg/dl (ref 0.4–1.2)
Glucose Random: 91 mg/dl (ref 74–160)
POTASSIUM: 4.4 mmol/L (ref 3.5–5.1)
SODIUM: 140 mmol/L (ref 135–144)
TOTAL PROTEIN: 7.2 g/dl (ref 5.9–7.5)

## 2006-02-08 LAB — BLOOD COUNT COMPLETE AUTO&AUTO DIFRNTL WBC
BASOPHIL %: 0.6 % (ref 0–2)
EOSINOPHIL %: 5.3 % (ref 0–7)
HEMATOCRIT: 41 % (ref 37.0–47.0)
HEMOGLOBIN: 13.7 g/dL (ref 12.0–16.0)
LYMPHOCYTE %: 48.5 % — ABNORMAL HIGH (ref 12–39)
MEAN CORP HGB CONC: 33.4 g/dL (ref 32.0–36.0)
MEAN CORPUSCULAR HGB: 27.6 pg (ref 27.0–31.0)
MEAN CORPUSCULAR VOL: 82.9 fL (ref 81.0–99.0)
MEAN PLATELET VOLUME: 7.8 fL (ref 6.4–10.8)
MONOCYTE %: 12.2 % — ABNORMAL HIGH (ref 1–12)
NEUTROPHIL %: 33.4 % — ABNORMAL LOW (ref 46–79)
PLATELET COUNT: 252 10*3/uL (ref 150–400)
RBC DISTRIBUTION WIDTH: 13.4 % (ref 11.5–14.3)
RED BLOOD CELL COUNT: 4.95 MIL/uL (ref 4.20–5.40)
WHITE BLOOD CELL COUNT: 3.5 10*3/uL — ABNORMAL LOW (ref 4.8–10.8)

## 2006-02-08 LAB — XR CHEST 2 VIEWS

## 2006-02-08 MED ORDER — HYDROCHLOROTHIAZIDE 25 MG PO TABS
ORAL_TABLET | ORAL | Status: DC
Start: 2006-02-08 — End: 2006-03-19

## 2006-02-08 NOTE — Progress Notes (Signed)
Entire visit with interpreter.      Reason for visit: BP recheck    HPI: Discharged from ER with viral syndrome dx and given cough syrup and tylenol for cough - somewhat improving. Was told to see MD for HTN soon after so appt. scheduled.     No meds in over a year and had HCTZ before.    No chest pains except with coughing. No dyspnea per se.     Had shaking chills 2 days ago and fevers - didn't check temp.    ROS: no diarrhea/nausea/vomiting      Patient Active Problem List:   TOBACCO USE DISORDER [305.1]   Priority: High [1]   Date Noted: 08/05/2004   Comment: 1/2 PPD since 50 YO   LATERAL FOOT PAIN [845.10]   Date Noted: 08/30/2005   Comment: Saw Dr. Forrest Moron, no clear dx 7/06   SCREENING FOR HYPERTENSION [V81.1]   Date Noted: 08/05/2004   Comment: occ elevated BP started on anti-HTN in    [place]; chem 7, EKG, HCT u/a wnl 4/03;    d/c'ed HCTZ 8/05 when we learned from    daughter she has not been taking it   RECURR DEPR PSYCHOS-MILD [296.31]   Date Noted: 08/05/2004   Comment: TSH wnl 8/02; trial of fluoxetine x 6 wks    didn't really help 8/05   Feels return of depression 2/06 to present,    trial of citalopram   OBESITY NOS [278.00]   Date Noted: 08/05/2004   Comment: has tried meds from [place]   PERS HX OF PAST NONCOMPLIANCE [V15.81]   Date Noted: 08/05/2004   Comment: poor compliance to med regimen    Meds: cough syrup and tylenol    Review of Patient's Allergies indicates:   Nkda (no known drug*       SH: accompanied by daughter   Tobacco Use: Yes Packs/Day: .5 Years: 40    Alcohol Use: No     PE: BP 150/90   Pulse 80   Temp (Src) 97.9 (Oral)   Ht 5\' 4"  (1.1m)   Wt 199 lbs (90.3kg)   LMP Postmenopausal  Repeat BP was the same.  Gen - obese but otherwise healthy appearing woman in no apparent distress, alert and oriented - coughing intermittently  CV - RRR no MRG  Pulm - CTAb - no wheeze and no crackles   -- note pt. became very light headed with ventilations done for the exam - spontaneously resolved but  seemed marked on observation of her actions    CXR of ER visit showed ? increase lung markings vs. technique    A/P: 50 y/o smoker who presetns with cough illness to ER and was told likely viral and asked to follow-up with MD due to BP    786.2 COUGH  Note: Likely viral illness. Doubt CHF or bacterial pneumonia.   Plan: COMPLETE CBC W/AUTO DIFF WBC   Chest xray repeated today since the one that was done did show some increased lung markings.  Emphasized need to stop tobacco use.    402.10 BENIGN HYP HRT DIS W/O HRT FAIL  Note: Patient has had high blood pressure both on home BP monitor (per her report) and in office.  Plan: ROUTINE VENIPUNCTURE, COMPREHENSIVE METABOLIC    PANEL, HYDROCHLOROTHIAZIDE 25 MG OR TABS restart    Patient requests follow-up with female provider. We discussed need for continuity with single provider. She will make follow-up appt. with me or Dr. Noel Gerold when she  gets to the receptionist.     check BP in 2 weeks.    Next visit: check urine for albumin, rediscuss tobacco cessaton, discuss weight, discuss bone density, mammogram due 4/07.

## 2006-02-17 ENCOUNTER — Encounter (HOSPITAL_BASED_OUTPATIENT_CLINIC_OR_DEPARTMENT_OTHER): Payer: Self-pay | Admitting: Internal Medicine

## 2006-02-17 DIAGNOSIS — I119 Hypertensive heart disease without heart failure: Secondary | ICD-10-CM | POA: Insufficient documentation

## 2006-02-17 DIAGNOSIS — R945 Abnormal results of liver function studies: Secondary | ICD-10-CM

## 2006-02-17 DIAGNOSIS — D7289 Other specified disorders of white blood cells: Secondary | ICD-10-CM

## 2006-02-27 ENCOUNTER — Ambulatory Visit (HOSPITAL_BASED_OUTPATIENT_CLINIC_OR_DEPARTMENT_OTHER): Payer: Charity | Admitting: Internal Medicine

## 2006-03-12 ENCOUNTER — Ambulatory Visit (HOSPITAL_BASED_OUTPATIENT_CLINIC_OR_DEPARTMENT_OTHER): Payer: Charity | Admitting: Internal Medicine

## 2006-03-12 VITALS — BP 148/98 | Temp 98.3°F | Wt 204.0 lb

## 2006-03-12 DIAGNOSIS — F329 Major depressive disorder, single episode, unspecified: Principal | ICD-10-CM

## 2006-03-12 DIAGNOSIS — E669 Obesity, unspecified: Secondary | ICD-10-CM

## 2006-03-12 DIAGNOSIS — D7289 Other specified disorders of white blood cells: Secondary | ICD-10-CM

## 2006-03-12 DIAGNOSIS — G4700 Insomnia, unspecified: Secondary | ICD-10-CM

## 2006-03-12 DIAGNOSIS — I119 Hypertensive heart disease without heart failure: Secondary | ICD-10-CM

## 2006-03-12 DIAGNOSIS — F3289 Other specified depressive episodes: Secondary | ICD-10-CM

## 2006-03-12 DIAGNOSIS — N200 Calculus of kidney: Secondary | ICD-10-CM

## 2006-03-12 DIAGNOSIS — R945 Abnormal results of liver function studies: Secondary | ICD-10-CM

## 2006-03-12 LAB — BLOOD COUNT COMPLETE AUTO&AUTO DIFRNTL WBC
BASOPHIL %: 0.8 % (ref 0–2)
EOSINOPHIL %: 1.7 % (ref 0–7)
HEMATOCRIT: 37.5 % (ref 37.0–47.0)
HEMOGLOBIN: 12.5 g/dL (ref 12.0–16.0)
LYMPHOCYTE %: 34.1 % (ref 12–39)
MEAN CORP HGB CONC: 33.4 g/dL (ref 32.0–36.0)
MEAN CORPUSCULAR HGB: 27.9 pg (ref 27.0–31.0)
MEAN CORPUSCULAR VOL: 83.4 fL (ref 81.0–99.0)
MEAN PLATELET VOLUME: 8 fL (ref 6.4–10.8)
MONOCYTE %: 7.2 % (ref 1–12)
NEUTROPHIL %: 56.2 % (ref 46–79)
PLATELET COUNT: 312 10*3/uL (ref 150–400)
RBC DISTRIBUTION WIDTH: 13.9 % (ref 11.5–14.3)
RED BLOOD CELL COUNT: 4.49 MIL/uL (ref 4.20–5.40)
WHITE BLOOD CELL COUNT: 7.9 10*3/uL (ref 4.8–10.8)

## 2006-03-12 LAB — URINE DIP (POINT OF CARE)
BILIRUBIN, URINE: NEGATIVE
GLUCOSE, URINE: NEGATIVE (ref 0–0)
KETONE, URINE: NEGATIVE
LEUKOCYTE ESTERASE: NEGATIVE
NITRITE, URINE: NEGATIVE
PH URINE: 5 (ref 5.0–8.0)
SPECIFIC GRAVITY URINE: 1.015 (ref 1.003–1.030)
UROBILINOGEN URINE: 0.2 (ref 0.2–1.0)

## 2006-03-12 LAB — URINALYSIS
BACTERIA: <10 PER HPF — AB (ref 0–?)
BILIRUBIN, URINE: NEGATIVE
CASTS: NONE SEEN PER LPF
GLUCOSE, URINE: NEGATIVE MG/DL
KETONE, URINE: NEGATIVE MG/DL
LEUKOCYTE ESTERASE: NEGATIVE
NITRITE, URINE: NEGATIVE
PH URINE: 5 (ref 5.0–8.0)
PROTEIN, URINE: NEGATIVE MG/DL
RED BLOOD CELLS URINE: NONE SEEN PER HPF (ref 0–2)
SPECIFIC GRAVITY URINE: 1.02 (ref 1.003–1.035)

## 2006-03-12 LAB — MICROALBUMIN RANDOM URINE
ALB/CREAT RATIO URINE RAN: 15 ug/mg (ref 0–30)
ALBUMIN URINE RANDOM: 1.7 mg/dl (ref 0.0–1.9)
CREATININE RANDOM URINE: 112 mg/dl

## 2006-03-12 LAB — BASIC METABOLIC PANEL
BUN (UREA NITROGEN): 11 mg/dl (ref 6–20)
CALCIUM: 10.2 mg/dl — ABNORMAL HIGH (ref 8.6–10.0)
CARBON DIOXIDE: 29 mmol/L (ref 22–32)
CHLORIDE: 104 mmol/L (ref 101–111)
CREATININE: 0.6 mg/dl (ref 0.4–1.2)
Glucose Random: 79 mg/dl (ref 74–160)
POTASSIUM: 4.3 mmol/L (ref 3.5–5.1)
SODIUM: 140 mmol/L (ref 135–144)

## 2006-03-12 LAB — THYROID SCREEN TSH REFLEX FT4: THYROID SCREEN TSH REFLEX FT4: 2.05 u[IU]/mL (ref 0.34–5.60)

## 2006-03-12 LAB — ASSAY OF PHOSPHATASE ALKALINE: ALKALINE PHOSPHATASE: 173 IU/L — ABNORMAL HIGH (ref 25–106)

## 2006-03-12 LAB — CHG ASSAY OF PHOSPHORUS INORGANIC: PHOSPHORUS: 4.2 mg/dl (ref 2.7–4.7)

## 2006-03-12 MED ORDER — CELEXA 20 MG PO TABS
ORAL_TABLET | ORAL | Status: DC
Start: 2006-03-12 — End: 2006-06-04

## 2006-03-12 MED ORDER — AMBIEN 5 MG PO TABS
ORAL_TABLET | ORAL | Status: DC
Start: 2006-03-12 — End: 2006-10-22

## 2006-03-12 NOTE — Progress Notes (Signed)
Entire visit conducted with assistance of [place] medical interpreter,Marcos.    Presents with daughter to clinic today.    Issues:   # HTN   - taking HCTZ  - thinks her BP at home is lower than it was before  (reports that it had been as high as 160/100 on occasion before but now is 130/80 to 150/90 at home)    # "Not motivated"  - doesn't conider herself to be a happy person   - denies feeling that there was something that triggered this  - anhedonia -- more than one year - 2 years  - goes through the motions of working  - about to live in house with daughter  - was prescribed SSRIs last year but never took them at all  - no prior h/o depressio treatment  - house is dirty and she doesn't have energy  - 4 years of feeling sadder  - sisters with depression    PHQ-9 questionnaire:  1. Little interest or pleasure in doing things  all the time    2. Feeling down, depressed, or hopeless  things will get better    3. Trouble falling asleep, staying asleep, or sleeping too much  a lot of insomnia  everyday  takes tylenol PM up to 2 per night  lies down and starts thinking --- preoccupied    4. Feeling tired or having low energy  everyday    5. Poor appetite or overeating  overeating    6. Feelign bad about yourself- or that you are a failure or have let yoursel fo your family down.  feels like she is ugly    7. Trouble conenctrating on things, such as reading the newspaper or watching television.  has trouble concentrating -- terrible memory     8. Moving or speaking so slowly that other people could have noticed. Or the opposite - being so fidgety or restless that you have been moving around a lot more than usual.  feels as if she rushes through things and doesn't do a good job    9. Thoughts that you would be better off dead or of hurting yourself in some way.  never      # Kidney pain  - notes h/o calculus and thinks she is having trouble again  - denies seeing frank blood in urine  - feels okay at this  moment    ROS: no chest pains or dyspnea       Patient Active Problem List:   TOBACCO USE DISORDER [305.1]   Priority: High [1]   Date Noted: 08/05/2004   Comment: 1/2 PPD since 50 YO   BENIGN HYP HRT DIS W/O HRT FAIL [402.10]   Date Noted: 02/17/2006   LATERAL FOOT PAIN [845.10]   Date Noted: 08/30/2005   Comment: Saw Dr. Forrest Moron, no clear dx 7/06   RECURR DEPR PSYCHOS-MILD [296.31]   Date Noted: 08/05/2004   Comment: TSH wnl 8/02; trial of fluoxetine x 6 wks    didn't really help 8/05   Feels return of depression 2/06 to present,    trial of citalopram   (patient now tells me that she never actually took these SSRIs)   OBESITY NOS [278.00]   Date Noted: 08/05/2004   Comment: has tried meds from [place]    Past Surgical History:   TOTAL HYSTERECTOMY 5/00    Comment: Hysterectomy, Total Abdominal secondary to    fibroids   REMOVAL OF OVARY/TUBE(S) 5/00  Comment: Salpingo-Oophorectomy, bilateral    Current outpatient prescriptions prior to 03/12/06:  HYDROCHLOROTHIAZIDE 25 MG OR TABS, 1 TABLET DAILY, Disp: 30, Rfl: 5    Review of Patient's Allergies indicates:   Nkda (no known drug*     SH: still smoking some  about to move in with daughter      PE: BP 148/98   Temp (Src) 98.3 (Oral)   Wt 204 lbs (92.5kg)   LMP Postmenopausal  Gen - overall healthy appearing except for being overweight  no apparent distress alert and oriented  Neck - no thyromegaly or nodule appreciated, no carotid bruit and nl upstrokes, nl lymphadenopathy   CV - RRR no MRG nl S1S2  Psych - overall blunted affect but made good eye contact -- see above PHQ9 results    A/P: 50 y/o woman with HTN and depression     311 DEPRESSIVE DISORDER NEC (primary encounter diagnosis)  Note: pt. exhibits signs/sxs of depression and remarks that sxs have been ongoing for at least the last year  Plan: THYROID SCREEN TSH, CELEXA 20 MG OR TABS   reviewed with her the potential side effects of citalopram and gave her the [place] Lexi-Comp handout on the medication    asked her to call me immediately if she feels agitated and thinks of hurting herself somehow    278.00 OBESITY NOS  Note: patient aware of weight gain   Plan: THYROID SCREEN TSH   will review weight loss techniques more at future meetings    592.0 CALCULUS OF KIDNEY  Note: reports h/o this and recent pains  Plan: MICRO URINE SEDIMENT (POINT OF CARE),    URINALYSIS   will consider imaging prn    780.52 INSOMNIA NOS  Note: reviewed with patient type of insomnia she has of difficulty falling asleep, reviewed that she needs to pay attention to the circumstances of her sleeping habits  Plan: AMBIEN 5 MG OR TABS   suggested meditation for 20 mins QHS and sleep hygiene measures - suggested yoga/stretching exercises too before sleeping   reviewed side effect profile of ambien, including sonambulim, and gave Lexi-Comp handout to pt. in [place] on the med    288.8 WBC DISEASE NEC  Note: low WBC on prior labs  Plan: ROUTINE VENIPUNCTURE, COMPLETE CBC W/AUTO DIFF    WBC   recheck    402.10 BENIGN HYP HRT DIS W/O HRT FAIL  Note: pt's BP not terribly well-controlled today   Plan: ROUTINE VENIPUNCTURE, ALBUMIN URINE RANDOM,    BASIC METABOLIC PANEL   handout on lisinopril given and told her we may have to add this medication   ask her to check at home   gave nutriteach handout regarding salt intake and encouraged her to review this and try to cut back on the salt so that will hopefully avoid extra medications   reviewed importance of BP reduction in reduction of CVA risk    794.8 ABNORMAL LIVER FUNCTION STUDY  Note: noted elevated alk phos  Plan: ROUTINE VENIPUNCTURE, ASSAY ALKALINE    PHOSPHATASE, LAB ADD ON TEST, ALKALINE    PHOSPHATASE ISOENZYME       275.42 HYPERCALCEMIA  Note: pt. with high calcium  Plan: LAB ADD ON TEST, ASSAY OF PHOSPHORUS, ALKALINE    PHOSPHATASE ISOENZYME, PTH INTACT WITH CALCIUM   ? if due to HCTZ      Consider 24 hour renal calcium excretion -- pt. is on HCTZ which lower calciuria actually     RTC IN  ONE MONTH MAX FOR REVIEW OF HER SXS.

## 2006-03-13 DIAGNOSIS — D7289 Other specified disorders of white blood cells: Secondary | ICD-10-CM | POA: Insufficient documentation

## 2006-03-13 DIAGNOSIS — G4700 Insomnia, unspecified: Secondary | ICD-10-CM

## 2006-03-13 DIAGNOSIS — N200 Calculus of kidney: Secondary | ICD-10-CM | POA: Insufficient documentation

## 2006-03-13 DIAGNOSIS — R748 Abnormal levels of other serum enzymes: Secondary | ICD-10-CM | POA: Insufficient documentation

## 2006-03-13 HISTORY — DX: Insomnia, unspecified: G47.00

## 2006-03-15 ENCOUNTER — Telehealth (HOSPITAL_BASED_OUTPATIENT_CLINIC_OR_DEPARTMENT_OTHER): Payer: Self-pay | Admitting: Internal Medicine

## 2006-03-15 DIAGNOSIS — N200 Calculus of kidney: Secondary | ICD-10-CM

## 2006-03-15 LAB — ALKALINE PHOSPHATASE ISOENZYME
ALKALINE PHOS ISOENZYME, BONE: 40 % (ref 11–68)
ALKALINE PHOS ISOENZYME, INTES: 1 % (ref 0–16)
ALKALINE PHOS ISOENZYME, LIVER: 59 % (ref 26–86)
ALKALINE PHOS ISOENZYME, TOTAL: 193 IU/L — AB (ref 25–150)

## 2006-03-15 LAB — PTH INTACT WITH CALCIUM
CALCIUM: 10.4 mg/dL (ref 8.5–10.6)
PTH INTACT: 70 pg/mL — AB (ref 12–65)

## 2006-03-15 NOTE — Telephone Encounter (Signed)
Tc to patient with assistance of [place] interpreter, Okey Dupre. Patient informed of normal CBC and kidney function but abnl calcium and PTH. Informed of need for Endocrinology follow-up.    Denies kidney pain this week but had pain from stones last week. Has had prior nephrolithiasis. Discussed need to see Endocrinologist now and to drink plenty of fluids. Advised to ER if flank pain, fevers, chills.

## 2006-03-19 ENCOUNTER — Telehealth (HOSPITAL_BASED_OUTPATIENT_CLINIC_OR_DEPARTMENT_OTHER): Payer: Self-pay | Admitting: Internal Medicine

## 2006-03-19 ENCOUNTER — Encounter (HOSPITAL_BASED_OUTPATIENT_CLINIC_OR_DEPARTMENT_OTHER): Payer: Self-pay | Admitting: Internal Medicine

## 2006-03-19 DIAGNOSIS — R197 Diarrhea, unspecified: Secondary | ICD-10-CM

## 2006-03-19 NOTE — Telephone Encounter (Signed)
Tc to patient with [employer] interpreter. I was thinking about the use of HCTZ in this patient and, given the question of hyperparathyroidism and high serum calcium, I am not sure it is wise to continue this medication. I advised her to stop the medication now so we can better evaluate her calcium/ PTH.    Interestingly noted her phosphorus was normal. Her alk phos has been elevated, however, since before the HCTZ was restarted this year.     Await findings by Dr. Jeannine Kitten before using/ changing to new anti-HTN but pt. stopping medication now.    Also note - c/o bad "kidney" pain today and over last few days. No fevers or chills. Told pt. I want to order CT now to r/o obstruction and to go to ER if severe pain or fevers or chills.      Also notes - separate concern- that she has diarrhea 4 to 5 times per day and this began actually before she started celexa - began actually 15 days ago. Occurs after each meal. No fevers or chills. Watery diarrhea with a foul smell. Has foamy appearance. No blood in stool. Sometimes has abdominal pain when needing to go to the bathroom. Nausea but no vomiting. +Gas/ Bloating. Suspect that she has giardia.   Patient coming in to get stool testing done.

## 2006-03-20 ENCOUNTER — Other Ambulatory Visit (HOSPITAL_BASED_OUTPATIENT_CLINIC_OR_DEPARTMENT_OTHER): Payer: Charity | Admitting: Lab

## 2006-03-21 ENCOUNTER — Other Ambulatory Visit: Payer: Self-pay | Admitting: Internal Medicine

## 2006-03-21 DIAGNOSIS — R1084 Generalized abdominal pain: Secondary | ICD-10-CM

## 2006-03-21 DIAGNOSIS — N949 Unspecified condition associated with female genital organs and menstrual cycle: Secondary | ICD-10-CM

## 2006-03-21 LAB — CT PELVIS WO CONTRAST

## 2006-03-21 LAB — CT ABDOMEN WO IV CONTRAST

## 2006-03-23 ENCOUNTER — Other Ambulatory Visit (HOSPITAL_BASED_OUTPATIENT_CLINIC_OR_DEPARTMENT_OTHER): Payer: Charity | Admitting: Lab

## 2006-03-23 DIAGNOSIS — R197 Diarrhea, unspecified: Secondary | ICD-10-CM

## 2006-03-23 LAB — FECAL OCCULT (POINT OF CARE) HOME TEST 3 CARDS
LOT #: 45
OCCULT BLOOD: NEGATIVE
OCCULT BLOOD: NEGATIVE
OCCULT BLOOD: POSITIVE

## 2006-03-23 NOTE — Nursing Note (Signed)
>>   Evelene Croon, JASBIR     03/23/2006   2:52 pm  Only stool specimen    jk

## 2006-03-26 ENCOUNTER — Ambulatory Visit (HOSPITAL_BASED_OUTPATIENT_CLINIC_OR_DEPARTMENT_OTHER): Payer: Charity | Admitting: "Endocrinology

## 2006-03-26 DIAGNOSIS — I10 Essential (primary) hypertension: Secondary | ICD-10-CM

## 2006-03-26 DIAGNOSIS — E669 Obesity, unspecified: Secondary | ICD-10-CM

## 2006-03-26 LAB — OCCULT BLOOD: OCCULT BLOOD: NEGATIVE

## 2006-03-26 LAB — MEDICAL SPECIALTIES LETTER

## 2006-03-27 ENCOUNTER — Telehealth (HOSPITAL_BASED_OUTPATIENT_CLINIC_OR_DEPARTMENT_OTHER): Payer: Self-pay | Admitting: Internal Medicine

## 2006-03-27 DIAGNOSIS — R197 Diarrhea, unspecified: Secondary | ICD-10-CM

## 2006-03-27 DIAGNOSIS — J984 Other disorders of lung: Secondary | ICD-10-CM

## 2006-03-27 DIAGNOSIS — R195 Other fecal abnormalities: Secondary | ICD-10-CM

## 2006-03-27 DIAGNOSIS — F17200 Nicotine dependence, unspecified, uncomplicated: Secondary | ICD-10-CM

## 2006-03-27 HISTORY — DX: Diarrhea, unspecified: R19.7

## 2006-03-27 HISTORY — DX: Other fecal abnormalities: R19.5

## 2006-03-27 HISTORY — DX: Other disorders of lung: J98.4

## 2006-03-27 LAB — OVA AND PARASITES: OVA AND PARASITES: NONE SEEN

## 2006-03-27 LAB — GIARDIA ANTIGEN: GIARDIA ANTIGEN: NEGATIVE

## 2006-03-27 NOTE — Telephone Encounter (Signed)
Tc to patient w/ assistance of [place], interpreter.    Reviewed the CT scan results with the patient.     Plan is the following:  1. Chest CT to better characterize if other nodules present in lung fields  2. Pulmonary consultation    Reviewed diarrheal sxs with pt. and heme + stool.  1. Recheck Giardia as this still could be the cause given the characteristics of the stool.  2. Consider if it is related to Celexa but pt. initially said it began pre-Celexa  3. GI referral placed    Other items need to discuss with pt.:  1. BMP to do at follow-up with me in early May and recheck BP now that she is on lisinopril (prescribed by Dr. Jeannine Kitten)  2. 24 hour urine for 5HIAA needs to be done as carcinoid syndrome needs ruling out - could lead to all of her sxs plus nodules (rare disorder but needs to be diagnosed if present)

## 2006-04-04 ENCOUNTER — Other Ambulatory Visit (HOSPITAL_BASED_OUTPATIENT_CLINIC_OR_DEPARTMENT_OTHER): Payer: Charity | Admitting: Lab

## 2006-04-04 ENCOUNTER — Other Ambulatory Visit (HOSPITAL_BASED_OUTPATIENT_CLINIC_OR_DEPARTMENT_OTHER): Payer: Self-pay | Admitting: Internal Medicine

## 2006-04-04 DIAGNOSIS — Z09 Encounter for follow-up examination after completed treatment for conditions other than malignant neoplasm: Secondary | ICD-10-CM

## 2006-04-04 LAB — BASIC METABOLIC PANEL
BUN (UREA NITROGEN): 14 mg/dl (ref 6–20)
CALCIUM: 9.6 mg/dl (ref 8.6–10.0)
CARBON DIOXIDE: 26 mmol/L (ref 22–32)
CHLORIDE: 105 mmol/L (ref 101–111)
CREATININE: 0.5 mg/dl (ref 0.4–1.2)
Glucose Random: 85 mg/dl (ref 74–160)
POTASSIUM: 4.1 mmol/L (ref 3.5–5.1)
SODIUM: 139 mmol/L (ref 135–144)

## 2006-04-04 NOTE — Nursing Note (Signed)
>>   Mariah [name]     04/04/2006   9:26 am  Labs drawn.  By Melvenia Needles

## 2006-04-05 ENCOUNTER — Other Ambulatory Visit: Payer: Self-pay | Admitting: Internal Medicine

## 2006-04-05 ENCOUNTER — Encounter (HOSPITAL_BASED_OUTPATIENT_CLINIC_OR_DEPARTMENT_OTHER): Payer: Charity

## 2006-04-05 DIAGNOSIS — R059 Cough, unspecified: Secondary | ICD-10-CM

## 2006-04-05 DIAGNOSIS — J984 Other disorders of lung: Secondary | ICD-10-CM

## 2006-04-05 DIAGNOSIS — R05 Cough: Principal | ICD-10-CM

## 2006-04-05 LAB — CT CHEST W CONTRAST

## 2006-04-05 NOTE — Progress Notes (Signed)
Labs after anti-HTN

## 2006-04-10 ENCOUNTER — Telehealth (HOSPITAL_BASED_OUTPATIENT_CLINIC_OR_DEPARTMENT_OTHER): Payer: Self-pay | Admitting: Infectious Diseases

## 2006-04-10 NOTE — Telephone Encounter (Addendum)
Call via interp.    C/o dry cough x 2 wks day and night. Does not recall h/o Seasonal allergies. Occ. runny nose. Denies SOB, + night sweats, denies fever. Feels ok except for this. Wants to see PCP only and requests being squeezed in today as can't come Thursday in triage.    Consult PCP  Pcp paged.

## 2006-04-10 NOTE — Telephone Encounter (Signed)
Called pt with [place] interpreter , gave her dr Preston Fleeting message and results . Pt has an appoint with dr Lisette Grinder 04/23/06 that she will keep . Also i told the pt she should be seen sooner or go to e.r if her symptoms exacerbate.she will stop the lisinopril today .

## 2006-04-10 NOTE — Telephone Encounter (Signed)
Please let the patient know I am sorry to hear that she has a cough for the last 2 weeks. I suspect that this may be a side effect of her medication, lisinopril, unfortunately which she was started on by Dr. Jeannine Kitten. (Please advise if she is not taking the lisinopril as prescribed by Dr. Jeannine Kitten.)      Lisinopril is a great medication (please let her know) that saves lives and I was going to start it for her hypertension but he went ahead and did so. Unfortunately, some people have a bad, dry cough with it. She should stop the lisinopril and see if her cough gets better over the next few days. I suspect it will.     Of course, if she has any dyspnea, fevers, hemoptysis or other symptoms that are worrisome she needs to be seen urgently - here or in the ER.     Can she see me on Friday or next week? If she is not better with stopping the ACE-I, then I definitely want to see her by Friday. I apologize but I can't see her today now. I think she will feel better with stopping the lisinopril and, then when I see her, we can review alternative BP meds. Her BP was high enough that it should be treated with something.    Please also let her know that her CT of her chest was without any obvious cancer. She will be relieved. She still must stop smoking. There were a few nodules seen still and these need monitoring to make sure they don't turn into something. I still want her to see a pulmonologist too.     Thank you, Misty Stanley

## 2006-04-10 NOTE — Telephone Encounter (Signed)
LM via interp. To All back to clinic.

## 2006-04-10 NOTE — Telephone Encounter (Signed)
Staff Message copied by Bevelyn Buckles on 04/10/2006 at 10:34 AM  ------   Message from: Tanna Savoy   Created: 04/10/2006 at 10:18 AM   Regarding: SICK    Mariah [name] [id number], 50 year old, female, [phone] (home) [phone] (work)    [name] NUMBER: [phone]  Cell phone:   Other phone:    Available times:    Patient's language of care: [place]    Patient needs a [place] interpreter.    Patient's PCP: Ebbie Ridge. Lisette Grinder, MD, MD    Person calling on behalf of patient: daughter    Calls today with a sick call.SHE HAS BAD COUGH

## 2006-04-16 ENCOUNTER — Other Ambulatory Visit: Payer: Self-pay | Admitting: Internal Medicine

## 2006-04-23 ENCOUNTER — Ambulatory Visit (HOSPITAL_BASED_OUTPATIENT_CLINIC_OR_DEPARTMENT_OTHER): Payer: Charity | Admitting: Internal Medicine

## 2006-04-23 VITALS — BP 140/86 | HR 74 | Temp 98.0°F | Ht 63.75 in | Wt 207.0 lb

## 2006-04-23 DIAGNOSIS — R05 Cough: Secondary | ICD-10-CM

## 2006-04-23 DIAGNOSIS — F329 Major depressive disorder, single episode, unspecified: Secondary | ICD-10-CM

## 2006-04-23 DIAGNOSIS — R197 Diarrhea, unspecified: Secondary | ICD-10-CM

## 2006-04-23 DIAGNOSIS — R059 Cough, unspecified: Secondary | ICD-10-CM

## 2006-04-23 DIAGNOSIS — K2970 Gastritis, unspecified, without bleeding: Secondary | ICD-10-CM | POA: Insufficient documentation

## 2006-04-23 DIAGNOSIS — R12 Heartburn: Secondary | ICD-10-CM

## 2006-04-23 DIAGNOSIS — J3089 Other allergic rhinitis: Secondary | ICD-10-CM | POA: Insufficient documentation

## 2006-04-23 DIAGNOSIS — D7289 Other specified disorders of white blood cells: Secondary | ICD-10-CM

## 2006-04-23 DIAGNOSIS — I119 Hypertensive heart disease without heart failure: Secondary | ICD-10-CM

## 2006-04-23 DIAGNOSIS — F3289 Other specified depressive episodes: Secondary | ICD-10-CM

## 2006-04-23 HISTORY — DX: Cough, unspecified: R05.9

## 2006-04-23 LAB — MA SCREENING MAMMO BILATERAL WITH CAD

## 2006-04-23 MED ORDER — PROTONIX 40 MG PO TBEC
DELAYED_RELEASE_TABLET | ORAL | Status: DC
Start: 2006-04-23 — End: 2006-06-04

## 2006-04-23 MED ORDER — CLARITIN 10 MG PO TABS
ORAL_TABLET | ORAL | Status: DC
Start: 2006-04-23 — End: 2006-06-04

## 2006-04-23 NOTE — Progress Notes (Signed)
Entire visit conducted with assistance of [place] medical interpreter,Wellard.       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 post-nasal drip  - no hemoptysis  - no breathing trouble  - no tobacco for 3 weeks   - wakes up coughing at night      # Heartburn  - a lot of pain in the "pit of hte stomach"   - no weight loss per our records  - takes alka seltzer and helps a little for a short time  - worse after eating  - takes ibuprofen "a lot" - for headaches or for arm pain -not using more recnetly though  - had this pain for years   - reports prior EGD that she was told there were bacteria and that she needed Rx but she didn't get the Rx - came to [place] instead (5 years ago)    # Diarrhea  - watery once a day at least   - no change from before    # BP  - no meds now as stopped lisinopril about 3 weeks ago  due to my question about its association with the cough - however pt. notes that cough began before the lisinopril    # Depressive sxs  - denies today and reports no meds   - daughter who was present sort of indicated that she still thinks this is a problem  - PHQ9 repeated    Patient Active Problem List:   TOBACCO USE DISORDER [305.1]   Priority: High [1]   Date Noted: 08/05/2004   Comment: 1/2 PPD since 50 YO - quit now for a few weeks   HEARTBURN [787.1]   Date Noted: 04/23/2006   DIARRHEA NOS [787.91]   Date Noted: 03/27/2006   ABN FIND-STOOL CONTENTS-OCC BLOOD [792.1]    Date Noted: 03/27/2006   nodules on chest CT 1/06 and 3/07 [518.89]   Date Noted: 03/27/2006   HYPERCALCEMIA [275.42]   Date Noted: 03/13/2006   ABNORMAL LIVER FUNCTION STUDY [794.8]   Date Noted: 03/13/2006   WBC DISEASE NEC [288.8]   Date Noted: 03/13/2006   INSOMNIA NOS [780.52]   Date Noted: 03/13/2006   CALCULUS OF KIDNEY [592.0]   Date Noted: 03/13/2006   DEPRESSIVE DISORDER NEC [311]   Date Noted: 03/13/2006   BENIGN HYP HRT DIS W/O HRT FAIL [402.10]   Date Noted: 02/17/2006   LATERAL FOOT PAIN [845.10]   Date Noted: 08/30/2005   Comment: Saw Dr. Forrest Moron, no clear dx 7/06   RECURR DEPR PSYCHOS-MILD [296.31]   Date Noted: 08/05/2004   Comment: TSH wnl 8/02; trial of fluoxetine x 6 wks    didn't really help 8/05   Feels return of depression 2/06 to present,    trial of citalopram   OBESITY NOS [  278.00]   Date Noted: 08/05/2004   Comment: has tried meds from [place]   PERS HX OF PAST NONCOMPLIANCE [V15.81]   Date Noted: 08/05/2004   Comment: poor compliance to med regimen    Meds: none   Review of Patient's Allergies indicates:   Nkda (no known drug*     SH: stopped smoking once I called her to tell her that her CT showed nodules    PE: BP 140/86   Pulse 74   Temp (Src) 98 (Oral)   Ht 5' 3.75" (1.12m)   Wt 207 lbs (93.9kg)   LMP Postmenopausal   Repeat BP 150/90    Gen - no apparent distress alert and oriented healthy appearing except obese  Oropharynx - pearlized membranes c/w drip  Nares - some mucous and enlarged turbinates  CV - RRR no RG slight systolic murmur heard in RUSB, nl S1S2  Pulm - CTA bialterally - no wheezes    Psych - affect unchanged - overall seems normal to slighlyt flat     PHQ-9 questionnaire:  1. Little interest or pleasure in doing things  more than half the days    2. Feeling down, depressed, or hopeless  never    3. Trouble falling asleep, staying asleep, or sleeping too much  never    4. Feeling tired or having low energy  always    5. Poor appetite or overeating  always  overeating    6. Feelign bad about yourself- or that you are a failure or have let yoursel fo your family down.  never    7. Trouble conenctrating on things, such as reading the newspaper or watching television.  always    8. Moving or speaking so slowly that other people could have noticed. Or the opposite - being so fidgety or restless that you have been moving around a lot more than usual.  always - axious    9. Thoughts that you would be better off dead or of hurting yourself in some way.  never           A/P: 50 y/o woman w/ following current issues    786.2 COUGH (primary encounter diagnosis)  Note: likely due to post-nasal drip and/or GERD - doubt associated with the very tiny nodules in her lower lung fields  Plan: Rx allergies and GERD and reassess in one month  will consider PFTs  will write Pulm MD about her nodules as there are no appointments in Pulmonary Med for one year now.    787.1 HEARTBURN  Note: patient describes prior +EGD and ongoing intermittent pain since then. Never treated.  Plan: PROTONIX 40 MG OR TBEC, HELICOBACTER PYLORI,    IGG   recheck Hpylori status    abx if positive   pt. already scheduled to see GI MD in June and aware    288.8 WBC DISEASE NEC  Note: recheck  Plan: COMPLETE CBC W/AUTO DIFF WBC       477.8 ALLERGIC RHINITIS NEC  Note: probable  Plan: CLARITIN 10 MG OR TABS       402.10 BENIGN HYP HRT DIS W/O HRT FAIL  Note: pt. with HTN   Plan: LISINOPRIL 10 MG OR TABS, COMPREHENSIVE    METABOLIC PANEL   restart ACE-I as unrelated to cough   recheck in 2 weeks time    787.91 DIARRHEA NOS  Note: daily  Plan: VASOACTIVE INTEST POLYPEPTIDE   also urine for 5HIAA as carcinoid could explain nodules and diarrhea -  pt. aware of test need and intends to collect specimen soon    DEPRESSIVE DISORDER  Patient's PQH9 along with her clinical presentation and denial of depression/sadness doesn't support med use to me at this time and she hasn't taken any meds recently - continue to monitor for  now.       RTC IN ONE MONTH TO SEE ME AND 2 WEEKS FOR BMP AND BP RECHECK.

## 2006-04-30 ENCOUNTER — Other Ambulatory Visit (HOSPITAL_BASED_OUTPATIENT_CLINIC_OR_DEPARTMENT_OTHER): Payer: Self-pay | Admitting: "Endocrinology

## 2006-05-02 ENCOUNTER — Telehealth (HOSPITAL_BASED_OUTPATIENT_CLINIC_OR_DEPARTMENT_OTHER): Payer: Self-pay | Admitting: Internal Medicine

## 2006-05-03 ENCOUNTER — Encounter (HOSPITAL_BASED_OUTPATIENT_CLINIC_OR_DEPARTMENT_OTHER): Payer: Charity

## 2006-05-03 LAB — CREATININE 24 HOUR URINE
CREATININE 24HR URINE MG%: 97 mg/dl
CREATININE 24HR URINE: 1.2 g/(24.h) (ref 0.6–1.8)
TOTAL VOLUME 24 HOUR URINE: 1200 ml/24HR (ref 600–1600)

## 2006-05-03 LAB — CALCIUM 24 HOUR URINE
CALCIUM 24HR URINE mg/dl: 25.3 mg/dl
CALCIUM 24HR URINE: 304 mg/24HR — ABNORMAL HIGH (ref 100–300)

## 2006-05-03 NOTE — Telephone Encounter (Signed)
Tc to pt. with [place] interpreter.     Patient doing okay. Heartburn much better with Protonix. Still with a lot of coughing. No hemoptysis. No change in cough.     Plan: check 5-HIAA, serum VIP, CMP (recheck as labs were abnormal last check - did not d/w pt. at this time), CBC, Hpylori IgG.    Recheck CT scan in 3 months per recs of Dr. Pershing Proud and then follow-up with Dr. Pershing Proud.     Will leave PFT request and CT request with letter for pick-up by pt. tomorrow morning.

## 2006-05-04 ENCOUNTER — Other Ambulatory Visit (HOSPITAL_BASED_OUTPATIENT_CLINIC_OR_DEPARTMENT_OTHER): Payer: Charity | Admitting: Lab

## 2006-05-04 DIAGNOSIS — I119 Hypertensive heart disease without heart failure: Secondary | ICD-10-CM

## 2006-05-04 DIAGNOSIS — D7289 Other specified disorders of white blood cells: Secondary | ICD-10-CM

## 2006-05-04 DIAGNOSIS — R197 Diarrhea, unspecified: Secondary | ICD-10-CM

## 2006-05-04 DIAGNOSIS — R12 Heartburn: Secondary | ICD-10-CM

## 2006-05-04 DIAGNOSIS — R945 Abnormal results of liver function studies: Secondary | ICD-10-CM

## 2006-05-04 LAB — COMPREHENSIVE METABOLIC PANEL
ALANINE AMINOTRANSFERASE: 73 IU/L — ABNORMAL HIGH (ref 7–35)
ALBUMIN: 4.2 g/dl (ref 3.4–4.8)
ALKALINE PHOSPHATASE: 147 IU/L — ABNORMAL HIGH (ref 25–106)
ASPARTATE AMINOTRANSFERASE: 58 IU/L — ABNORMAL HIGH (ref 8–34)
BILIRUBIN TOTAL: 0.6 mg/dl (ref 0.2–1.1)
BUN (UREA NITROGEN): 15 mg/dl (ref 6–20)
CALCIUM: 10 mg/dl (ref 8.6–10.0)
CARBON DIOXIDE: 27 mmol/L (ref 22–32)
CHLORIDE: 104 mmol/L (ref 101–111)
CREATININE: 0.5 mg/dl (ref 0.4–1.2)
Glucose Random: 81 mg/dl (ref 74–160)
POTASSIUM: 4 mmol/L (ref 3.5–5.1)
SODIUM: 139 mmol/L (ref 135–144)
TOTAL PROTEIN: 7.3 g/dl (ref 5.9–7.5)

## 2006-05-04 LAB — CHG ASSAY OF MAGNESIUM: MAGNESIUM: 2 mg/dl (ref 1.8–2.6)

## 2006-05-04 LAB — VITAMIN D,25 HYDROXY: VITAMIN D,25 HYDROXY: 13.1 ng/mL — AB (ref 32.0–100.0)

## 2006-05-04 LAB — CHG ASSAY OF PHOSPHORUS INORGANIC: PHOSPHORUS: 4.1 mg/dl (ref 2.7–4.7)

## 2006-05-04 LAB — PTH INTACT WITH CALCIUM
CALCIUM: 10.1 mg/dL (ref 8.5–10.6)
PTH INTACT: 54 pg/mL (ref 12–65)

## 2006-05-04 LAB — VITAMIN D 1,25-DIHYDROXY: VITAMIN D 1,25-DIHYDROXY: 69.8 pg/mL — AB (ref 15.9–55.6)

## 2006-05-04 NOTE — Nursing Note (Signed)
>>   Mariah [name]     05/04/2006   10:24 am  Labs drawn.  By Melvenia Needles

## 2006-05-05 LAB — BLOOD COUNT COMPLETE AUTO&AUTO DIFRNTL WBC
BASOPHIL %: 0.8 % (ref 0–2)
EOSINOPHIL %: 1.2 % (ref 0–7)
HEMATOCRIT: 38.4 % (ref 37.0–47.0)
HEMOGLOBIN: 12.9 g/dL (ref 12.0–16.0)
LYMPHOCYTE %: 37.1 % (ref 12–39)
MEAN CORP HGB CONC: 33.5 g/dL (ref 32.0–36.0)
MEAN CORPUSCULAR HGB: 27.8 pg (ref 27.0–31.0)
MEAN CORPUSCULAR VOL: 82.8 fL (ref 81.0–99.0)
MEAN PLATELET VOLUME: 7.8 fL (ref 6.4–10.8)
MONOCYTE %: 8.4 % (ref 1–12)
NEUTROPHIL %: 52.5 % (ref 46–79)
PLATELET COUNT: 326 10*3/uL (ref 150–400)
RBC DISTRIBUTION WIDTH: 13.9 % (ref 11.5–14.3)
RED BLOOD CELL COUNT: 4.64 MIL/uL (ref 4.20–5.40)
WHITE BLOOD CELL COUNT: 5.2 10*3/uL (ref 4.8–10.8)

## 2006-05-05 NOTE — Progress Notes (Addendum)
Addended by: Blane Ohara on: 05/05/2006 2:29:24 PM     Modules accepted: Orders

## 2006-05-07 ENCOUNTER — Other Ambulatory Visit (HOSPITAL_BASED_OUTPATIENT_CLINIC_OR_DEPARTMENT_OTHER): Payer: Self-pay | Admitting: Internal Medicine

## 2006-05-07 ENCOUNTER — Encounter (HOSPITAL_BASED_OUTPATIENT_CLINIC_OR_DEPARTMENT_OTHER): Payer: Charity | Admitting: "Endocrinology

## 2006-05-07 ENCOUNTER — Encounter (HOSPITAL_BASED_OUTPATIENT_CLINIC_OR_DEPARTMENT_OTHER): Payer: Charity | Admitting: Ambulatory Care

## 2006-05-07 LAB — LACTATE DEHYDROGENASE: LACTATE DEHYDROGENASE: 171 IU/L (ref 100–242)

## 2006-05-07 NOTE — Progress Notes (Addendum)
Addended by: Gertie Baron on: 05/07/2006 8:54:29 AM     Modules accepted: Orders

## 2006-05-08 LAB — HEPATITIS C ANTIBODY: HEPATITIS C ANTIBODY: NEGATIVE

## 2006-05-08 LAB — COMPREHENSIVE METABOLIC PANEL
ALANINE AMINOTRANSFERASE: 57 IU/L — ABNORMAL HIGH (ref 7–35)
ALBUMIN: 4.1 g/dl (ref 3.4–4.8)
ALKALINE PHOSPHATASE: 144 IU/L — ABNORMAL HIGH (ref 25–106)
ASPARTATE AMINOTRANSFERASE: 40 IU/L — ABNORMAL HIGH (ref 8–34)
BILIRUBIN TOTAL: 0.7 mg/dl (ref 0.2–1.1)
BUN (UREA NITROGEN): 11 mg/dl (ref 6–20)
CALCIUM: 9.8 mg/dl (ref 8.6–10.0)
CARBON DIOXIDE: 29 mmol/L (ref 22–32)
CHLORIDE: 102 mmol/L (ref 101–111)
CREATININE: 0.6 mg/dl (ref 0.4–1.2)
Glucose Random: 81 mg/dl (ref 74–160)
POTASSIUM: 4.4 mmol/L (ref 3.5–5.1)
SODIUM: 138 mmol/L (ref 135–144)
TOTAL PROTEIN: 7.2 g/dl (ref 5.9–7.5)

## 2006-05-08 LAB — ALPHA-FETOPROTEIN TUMOR MARKER: ALPHA-FETOPROTEIN TUMOR MARKER: 10.5 ng/mL — ABNORMAL HIGH (ref 0.0–9.0)

## 2006-05-08 LAB — MEDICAL SPECIALTIES LETTER

## 2006-05-08 LAB — HEPATITIS B SURFACE ANTIBODY: HEPATITIS B SURFACE ANTIBODY: NEGATIVE

## 2006-05-08 LAB — HELICOBACTER PYLORI IGG: HELICOBACTER PYLORI IGG: POSITIVE — AB

## 2006-05-12 ENCOUNTER — Telehealth (HOSPITAL_BASED_OUTPATIENT_CLINIC_OR_DEPARTMENT_OTHER): Payer: Self-pay | Admitting: Internal Medicine

## 2006-05-12 NOTE — Telephone Encounter (Addendum)
Need to schedule appointment with her.    Left message on voice mail.

## 2006-05-15 LAB — VASOACTIVE INTEST POLYPEPTIDE: VASOACTIVE INTEST POLYPEPTIDE: <10 pg/mL — AB (ref 23.0–63.0)

## 2006-05-15 NOTE — Telephone Encounter (Signed)
Tc to patient with [employer] interpreter to ask her to come in tomorrow at 4pm to see me. She will need the PPD placed then.   She has no h/o TB, +TB skin test, or TB exposure to her knowledge.

## 2006-05-16 ENCOUNTER — Encounter (HOSPITAL_BASED_OUTPATIENT_CLINIC_OR_DEPARTMENT_OTHER): Payer: Self-pay | Admitting: Internal Medicine

## 2006-05-16 ENCOUNTER — Ambulatory Visit (HOSPITAL_BASED_OUTPATIENT_CLINIC_OR_DEPARTMENT_OTHER): Payer: Charity | Admitting: Internal Medicine

## 2006-05-16 VITALS — BP 140/80 | Temp 98.3°F | Wt 207.0 lb

## 2006-05-16 DIAGNOSIS — R059 Cough, unspecified: Secondary | ICD-10-CM

## 2006-05-16 DIAGNOSIS — I119 Hypertensive heart disease without heart failure: Secondary | ICD-10-CM

## 2006-05-16 DIAGNOSIS — R05 Cough: Principal | ICD-10-CM

## 2006-05-16 DIAGNOSIS — R945 Abnormal results of liver function studies: Secondary | ICD-10-CM

## 2006-05-16 DIAGNOSIS — R233 Spontaneous ecchymoses: Secondary | ICD-10-CM

## 2006-05-16 DIAGNOSIS — R197 Diarrhea, unspecified: Secondary | ICD-10-CM

## 2006-05-16 LAB — BLOOD COUNT COMPLETE AUTO&AUTO DIFRNTL WBC
BASOPHIL %: 0.8 % (ref 0–2)
EOSINOPHIL %: 1.3 % (ref 0–7)
HEMATOCRIT: 36.8 % — ABNORMAL LOW (ref 37.0–47.0)
HEMOGLOBIN: 12.5 g/dL (ref 12.0–16.0)
LYMPHOCYTE %: 31.9 % (ref 12–39)
MEAN CORP HGB CONC: 33.9 g/dL (ref 32.0–36.0)
MEAN CORPUSCULAR HGB: 28 pg (ref 27.0–31.0)
MEAN CORPUSCULAR VOL: 82.6 fL (ref 81.0–99.0)
MEAN PLATELET VOLUME: 7.9 fL (ref 6.4–10.8)
MONOCYTE %: 7.6 % (ref 1–12)
NEUTROPHIL %: 58.4 % (ref 46–79)
PLATELET COUNT: 313 10*3/uL (ref 150–400)
RBC DISTRIBUTION WIDTH: 13.9 % (ref 11.5–14.3)
RED BLOOD CELL COUNT: 4.46 MIL/uL (ref 4.20–5.40)
WHITE BLOOD CELL COUNT: 7.9 10*3/uL (ref 4.8–10.8)

## 2006-05-16 MED ORDER — IRBESARTAN 150 MG PO TABS
ORAL_TABLET | ORAL | Status: DC
Start: 2006-05-16 — End: 2006-06-04

## 2006-05-16 NOTE — Nursing Note (Signed)
>>   [name], Mariah     05/18/2006   2:18 pm  Ppd planted 05/16/06 .placed ppd left forearm . Will return Friday for read . 05/18/06.

## 2006-05-16 NOTE — Progress Notes (Signed)
Entire visit conducted with assistance of [place] medical interpreter,Marcos Pienasola.    Reason for visit: Recheck     # Cough is almost gone now - no longer having cough to point of nausea though at times still a little   - now seems to think that cough had improved some when I had asked her to hold lisinopril temporarily too  - no breathing problems      # Diarrhea is gone now too - stools seem thin but no diarrhea now      # Depressive sxs - improved - sleeping better - patient restarted the Celexa medication at 20mg  daily now  sleeping well     # Tobacco use - stopped smoking still     Patient Active Problem List:   TOBACCO USE DISORDER [305.1]   Priority: High [1]   Date Noted: 08/05/2004   Comment: 1/2 PPD since 50 YO - quit 3/07   HEARTBURN [787.1]   Date Noted: 04/23/2006   ALLERGIC RHINITIS NEC [477.8]   Date Noted: 04/23/2006   COUGH [786.2]   Date Noted: 04/23/2006   DIARRHEA NOS [787.91]   Date Noted: 03/27/2006   ABN FIND-STOOL CONTENTS-OCC BLOOD [792.1]   Date Noted: 03/27/2006   nodules on chest CT 1/06 and 3/07 [518.89]   Date Noted: 03/27/2006   HYPERCALCEMIA [275.42]   Date Noted: 03/13/2006   ABNORMAL LIVER FUNCTION STUDY [794.8]   Date Noted: 03/13/2006   WBC DISEASE NEC [288.8]   Date Noted: 03/13/2006   INSOMNIA NOS [780.52]   Date Noted: 03/13/2006   CALCULUS OF KIDNEY [592.0]   Date Noted: 03/13/2006   DEPRESSIVE DISORDER NEC [311]   Date Noted: 03/13/2006   BENIGN HYP HRT DIS W/O HRT FAIL [402.10]   Date Noted: 02/17/2006   LATERAL FOOT PAIN [845.10]   Date Noted: 08/30/2005   Comment: Saw Dr. Forrest Moron, no clear dx 7/06   RECURR DEPR PSYCHOS-MILD [296.31]   Date Noted: 08/05/2004   Comment: TSH wnl 8/02; trial of fluoxetine x 6 wks    didn't really help 8/05   Feels return of depression 2/06 to present,    trial of citalopram   OBESITY NOS [278.00]   Date Noted: 08/05/2004   Comment: has tried meds from [place]     Current outpatient prescriptions: IRBESARTAN 75 MG OR TABS, 1 TABLET  DAILY, Disp: 60, Rfl: 0 --- dose needs clarification ; PROTONIX 40 MG OR TBEC, 1 TABLET BY MOUTH DAILY FOR 8 WEEKS, Disp: 56, Rfl: 3; CLARITIN 10 MG OR TABS, 1 TABLET BY MOUTH DAILY AS NEEDED;NOT MORE THAN ONE TABLET DAILY, Disp: 30, Rfl: 5; AMBIEN 5 MG OR TABS, 1 TABLET AT BEDTIME AS NEEDED, Disp: 15, Rfl: 1; CELEXA 20 MG OR TABS, Begin with half a tablet daily for one week, then increase to 2 tablets every morning if no adverse effects., Disp: 30, Rfl: 3     Review of Patient's Allergies indicates:   Lisinopril Cough     PE: BP 140/80   Temp 98.3   Wt 207 lbs (93.9kg)   LMP Postmenopausal   Gen - overall healthy appearing except for overweight, no apparent distress alert and oriented  HEENT - sclerae anicteric, no lymphadenopathy no thyromegaly/nodules, no carotid bruits/normal carotids  CV - RRR no MRG nl S1S2  Pulm - CTA bilaterally  Abd - obese, soft and non-tender throughout, no hepatosplenomegaly appreciated and no palpable masses  Exts - no edema, c,c- normal d.p. bilaterally  Nodes - checked  for axillary, cervical, supraclavicular, inguinal lymph nodes and none palpable    A/P: 50 y/o woman who has had recently a cough, pulmonary nodules, high calcium, high vitamin D, and diarrhea with one hemoccult positive stool - feeling overall improved today    786.2 COUGH (primary encounter diagnosis)  Note: Cough is much better now. Difficult to say which if any intervention made the difference, loratadine, prilosec, stopping lisinopril. No signs of active pulmonary infection.   Pulmonary nodules under evaluation. Corresponded with Dr. Dorette Grate who had reviewed the CT scan results from this year and the last year and the nodules are small and without change, too small to biopsy. Dr. Dorette Grate will see her after a 3 month f/u CT scan. Of note, quit tobacco now.   Plan: As long as cough goes away, no need for further w/u for this alone(i.e, no PFTs to be done at this time). CT of chest in July and f/u w/ Dr. Dorette Grate thereafter.  Will call Dr. Zachery Dakins office since even though I had requested an appointment be scheduled through our receptionists w/ authorization of Dr. Dorette Grate it appears that none has been made.     402.10 BENIGN HYP HRT DIS W/O HRT FAIL  Note: meds changed recently and pt. unsure of dose of new med  Plan: IRBESARTAN 75 MG OR TABS, ROUTINE VENIPUNCTURE   will need to confirm dose and increase it as patient with hypertension still    794.8 ABNORMAL LIVER FUNCTION STUDY  Note: Unclear etiology but concerning. No new sxs and in fact feeling better.   Plan: COMPREHENSIVE METABOLIC PANEL, ROUTINE    VENIPUNCTURE, ALPHA-FETOPROTEIN repeat, SERUM, HEP B    SURFACE ANTIGEN, HEP B CORE ANTIBODY   will try to get appt. with GI moved up   [place] requested of liver/ RUQ - would prefer not to use another CT scan right away if possible due to radiation dose    will review with GI MD    275.42 HYPERCALCEMIA  Note: patient with high PTH and high vitamin D 1,25OH - no h/o TB and seems unlikely - most worrisome ddx would be lymphoma   Plan: ROUTINE VENIPUNCTURE, TB INTRADERMAL TEST,    VITAMIN D 1,25-DIHYDROXY   recheck and will correspond with GI re: ? earlier appt.     787.91 DIARRHEA NOS  Note: improved spontaneously per patient report  Plan: still needs to see GI MD      RTC AFTER THE [place] AND FRIDAY FOR PPD READING.

## 2006-05-18 ENCOUNTER — Telehealth (HOSPITAL_BASED_OUTPATIENT_CLINIC_OR_DEPARTMENT_OTHER): Payer: Self-pay | Admitting: Internal Medicine

## 2006-05-18 ENCOUNTER — Ambulatory Visit (HOSPITAL_BASED_OUTPATIENT_CLINIC_OR_DEPARTMENT_OTHER): Payer: Charity | Admitting: Ambulatory Care

## 2006-05-18 VITALS — BP 150/96 | HR 84

## 2006-05-18 DIAGNOSIS — I119 Hypertensive heart disease without heart failure: Secondary | ICD-10-CM

## 2006-05-18 LAB — SKIN TEST TUBERCULOSIS INTRADERMAL

## 2006-05-18 NOTE — Telephone Encounter (Signed)
Dr Lisette Grinder has requisition faxed to radiology and front desk to call pt to get xray asap ,  Induced sputum . Will have to call resp on Monday .

## 2006-05-18 NOTE — Telephone Encounter (Addendum)
Tc to TB clinic. They are going to arrange appt. earlier. (They declined assisting with arranging the studies, however, at this time.) Patient has had cough recently. No fever or chills and cough has been improving. Cough possibly due to ACE-I but cannot rule out TB without studies.     Needs eval now though with repeat CXR and TB sputum smears. Will need induced sputums through RT.       PLEASE HELP ARRANGE. NEEDS CXR WITHIN NEXT COUPLE OF DAYS. WILL BRING BY CXR REQUEST TO RN STATION.

## 2006-05-18 NOTE — Nursing Note (Signed)
>>   [name], Mariah     05/18/2006   2:54 pm  Pt here for ppd read and bp check . Bp 150/96 pulse 84 . Pt saw dr Lisette Grinder Wednesday 5/30 irbesartan increased to 150 mg bid . Started yesterday . Paged dr hert-miller . Pt to stay on same regime and f/u in 2 weeks with rn. Wil check k then as well . K done 5/30 3.4 . Encouraged to eat high k foods . Bananas strawberries etc to increase k . Lab for k placed .     Ppd positive , 25 mm . Made an appoint with t.b clinic 06/12/06 at 4:30 pm .  Dph notification faxed . Referral faxed to t b clinic .   Will forward to pcp for review .

## 2006-05-18 NOTE — Telephone Encounter (Signed)
I spoke with the Respiratory Therapists at [facility] and the procedure for obtaining induced sputums as an outpatient is to just have the patient register through admitting for 3 days in a row of appointments with Respiratory Therapy. The RTs need to know when the patient is coming to the hospital and they do the induced sputums. I have arranged everything, including obtaining the hypertonic saline from the pharmacy.     The patient is going to the [facility] for induced sputums and the designated finance numbers for the visits are listed:    1. Saturday June 2 at 9am    (Acct. # [id number])  2. Sunday June 3 at 9am   (Acct. # [id number])  3. Tuesday June 5 at 4pm   (Acct. # [id number])    I am faxing this information to Respiratory Therapy (FAX # [phone] and phone # [phone])    The pharmacy FAX # is [phone] and I am faxing a handwritten order for the 3 salines needed for obtaining the sputum.     I have also faxed a chest xray requisition to the [facility] and confirmed it is there via phone [phone] and that the patient can go anytime to get a new chest xray done.     I spoke again with the patient tonight too about her sxs. Her cough is definitely much, much better after stopping the ACE-I about a week ago. She has no fevers, no chills, no night sweats, and no weight loss. She feels well.     She will get these tests done and will attend the TB clinic at 5:30 pm on Tuesday June 5.

## 2006-05-19 ENCOUNTER — Other Ambulatory Visit: Payer: Self-pay | Admitting: Internal Medicine

## 2006-05-20 ENCOUNTER — Other Ambulatory Visit: Payer: Self-pay | Admitting: Internal Medicine

## 2006-05-21 LAB — VITAMIN D 1,25-DIHYDROXY: VITAMIN D 1,25-DIHYDROXY: 50.9 pg/mL (ref 15.9–55.6)

## 2006-05-22 ENCOUNTER — Other Ambulatory Visit: Payer: Self-pay | Admitting: Internal Medicine

## 2006-05-22 ENCOUNTER — Encounter (HOSPITAL_BASED_OUTPATIENT_CLINIC_OR_DEPARTMENT_OTHER): Payer: Self-pay | Admitting: Internal Medicine

## 2006-05-22 ENCOUNTER — Ambulatory Visit (HOSPITAL_BASED_OUTPATIENT_CLINIC_OR_DEPARTMENT_OTHER): Payer: Self-pay | Admitting: Internal Medicine

## 2006-05-22 LAB — XR CHEST 1 VIEW

## 2006-05-23 LAB — COMPREHENSIVE METABOLIC PANEL
ALANINE AMINOTRANSFERASE: 54 IU/L — ABNORMAL HIGH (ref 7–35)
ALBUMIN: 4 g/dl (ref 3.4–4.8)
ALKALINE PHOSPHATASE: 138 IU/L — ABNORMAL HIGH (ref 25–106)
ASPARTATE AMINOTRANSFERASE: 49 IU/L — ABNORMAL HIGH (ref 8–34)
BILIRUBIN TOTAL: 0.6 mg/dl (ref 0.2–1.1)
BUN (UREA NITROGEN): 14 mg/dl (ref 6–20)
CALCIUM: 9.6 mg/dl (ref 8.6–10.0)
CARBON DIOXIDE: 27 mmol/L (ref 22–32)
CHLORIDE: 99 mmol/L — ABNORMAL LOW (ref 101–111)
CREATININE: 0.8 mg/dl (ref 0.4–1.2)
Glucose Random: 97 mg/dl (ref 74–160)
POTASSIUM: 3.4 mmol/L — ABNORMAL LOW (ref 3.5–5.1)
SODIUM: 137 mmol/L (ref 135–144)
TOTAL PROTEIN: 7.1 g/dl (ref 5.9–7.5)

## 2006-05-23 LAB — HEPATITIS B SURFACE ANTIGEN: HEPATITIS B SURFACE ANTIGEN: NONREACTIVE

## 2006-05-23 LAB — ALPHA-FETOPROTEIN TUMOR MARKER: ALPHA-FETOPROTEIN TUMOR MARKER: 7.8 ng/mL (ref 0.0–9.0)

## 2006-05-23 LAB — HEPATITIS B CORE ANTIBODY: HEPATITIS B CORE ANTIBODY: NEGATIVE

## 2006-05-28 ENCOUNTER — Encounter (HOSPITAL_BASED_OUTPATIENT_CLINIC_OR_DEPARTMENT_OTHER): Payer: Self-pay | Admitting: Internal Medicine

## 2006-05-28 ENCOUNTER — Other Ambulatory Visit (HOSPITAL_BASED_OUTPATIENT_CLINIC_OR_DEPARTMENT_OTHER): Payer: Self-pay | Admitting: Rheumatology

## 2006-05-28 ENCOUNTER — Other Ambulatory Visit: Payer: Self-pay | Admitting: Internal Medicine

## 2006-05-29 ENCOUNTER — Ambulatory Visit (HOSPITAL_BASED_OUTPATIENT_CLINIC_OR_DEPARTMENT_OTHER): Payer: Self-pay | Admitting: Infectious Disease

## 2006-05-29 LAB — CHG HEPATIC FUNCTION PANEL
ALANINE AMINOTRANSFERASE: 32 IU/L (ref 7–35)
ALBUMIN: 4.1 g/dl (ref 3.4–4.8)
ALKALINE PHOSPHATASE: 127 IU/L — ABNORMAL HIGH (ref 25–106)
ASPARTATE AMINOTRANSFERASE: 26 IU/L (ref 8–34)
BILIRUBIN DIRECT: 0.1 mg/dl (ref 0.0–0.2)
BILIRUBIN TOTAL: 0.5 mg/dl (ref 0.2–1.1)
TOTAL PROTEIN: 7 g/dl (ref 5.9–7.5)

## 2006-05-30 ENCOUNTER — Ambulatory Visit (HOSPITAL_BASED_OUTPATIENT_CLINIC_OR_DEPARTMENT_OTHER): Payer: Self-pay | Admitting: Infectious Disease

## 2006-05-30 DIAGNOSIS — Z111 Encounter for screening for respiratory tuberculosis: Secondary | ICD-10-CM

## 2006-05-30 LAB — BLOOD COUNT COMPLETE AUTO&AUTO DIFRNTL WBC
BASOPHIL %: 0.6 % (ref 0–2)
EOSINOPHIL %: 1.7 % (ref 0–7)
HEMATOCRIT: 37 % (ref 37.0–47.0)
HEMOGLOBIN: 12.4 g/dL (ref 12.0–16.0)
LYMPHOCYTE %: 38.2 % (ref 12–39)
MEAN CORP HGB CONC: 33.5 g/dL (ref 32.0–36.0)
MEAN CORPUSCULAR HGB: 27.7 pg (ref 27.0–31.0)
MEAN CORPUSCULAR VOL: 82.8 fL (ref 81.0–99.0)
MEAN PLATELET VOLUME: 8.1 fL (ref 6.4–10.8)
MONOCYTE %: 5.9 % (ref 1–12)
NEUTROPHIL %: 53.6 % (ref 46–79)
PLATELET COUNT: 332 10*3/uL (ref 150–400)
RBC DISTRIBUTION WIDTH: 13.8 % (ref 11.5–14.3)
RED BLOOD CELL COUNT: 4.47 MIL/uL (ref 4.20–5.40)
WHITE BLOOD CELL COUNT: 5.6 10*3/uL (ref 4.8–10.8)

## 2006-05-30 LAB — CHG SEDIMENTATION RATE RBC NON-AUTOMATED: RBC SEDIMENTATION RATE: 39 MM/HR — ABNORMAL HIGH (ref 0–15)

## 2006-06-01 ENCOUNTER — Encounter (HOSPITAL_BASED_OUTPATIENT_CLINIC_OR_DEPARTMENT_OTHER): Payer: Charity | Admitting: Ambulatory Care

## 2006-06-04 ENCOUNTER — Ambulatory Visit (HOSPITAL_BASED_OUTPATIENT_CLINIC_OR_DEPARTMENT_OTHER): Payer: Charity | Admitting: Internal Medicine

## 2006-06-04 ENCOUNTER — Encounter (HOSPITAL_BASED_OUTPATIENT_CLINIC_OR_DEPARTMENT_OTHER): Payer: Self-pay | Admitting: Internal Medicine

## 2006-06-04 VITALS — BP 140/100 | Temp 98.3°F | Wt 208.0 lb

## 2006-06-04 DIAGNOSIS — R12 Heartburn: Secondary | ICD-10-CM

## 2006-06-04 DIAGNOSIS — R229 Localized swelling, mass and lump, unspecified: Secondary | ICD-10-CM

## 2006-06-04 DIAGNOSIS — N912 Amenorrhea, unspecified: Secondary | ICD-10-CM

## 2006-06-04 DIAGNOSIS — R1013 Epigastric pain: Secondary | ICD-10-CM

## 2006-06-04 DIAGNOSIS — Z09 Encounter for follow-up examination after completed treatment for conditions other than malignant neoplasm: Secondary | ICD-10-CM

## 2006-06-04 DIAGNOSIS — J3089 Other allergic rhinitis: Secondary | ICD-10-CM

## 2006-06-04 DIAGNOSIS — R7611 Nonspecific reaction to tuberculin skin test without active tuberculosis: Secondary | ICD-10-CM | POA: Insufficient documentation

## 2006-06-04 DIAGNOSIS — I119 Hypertensive heart disease without heart failure: Secondary | ICD-10-CM

## 2006-06-04 HISTORY — DX: Amenorrhea, unspecified: N91.2

## 2006-06-04 LAB — CHG HEPATIC FUNCTION PANEL
ALANINE AMINOTRANSFERASE: 34 IU/L (ref 7–35)
ALBUMIN: 4 g/dl (ref 3.4–4.8)
ALKALINE PHOSPHATASE: 115 IU/L — ABNORMAL HIGH (ref 25–106)
ASPARTATE AMINOTRANSFERASE: 36 IU/L — ABNORMAL HIGH (ref 8–34)
BILIRUBIN DIRECT: 0.1 mg/dl (ref 0.0–0.2)
BILIRUBIN TOTAL: 0.5 mg/dl (ref 0.2–1.1)
TOTAL PROTEIN: 7 g/dl (ref 5.9–7.5)

## 2006-06-04 LAB — US ABDOMEN PELVIS SCROTUM & OR RETROPERITONEUM DOPPLER LIMITED

## 2006-06-04 LAB — BASIC METABOLIC PANEL
BUN (UREA NITROGEN): 15 mg/dl (ref 6–20)
CALCIUM: 10.1 mg/dl — ABNORMAL HIGH (ref 8.6–10.0)
CARBON DIOXIDE: 26 mmol/L (ref 22–32)
CHLORIDE: 104 mmol/L (ref 101–111)
CREATININE: 0.6 mg/dl (ref 0.4–1.2)
Glucose Random: 124 mg/dl (ref 74–160)
POTASSIUM: 4 mmol/L (ref 3.5–5.1)
SODIUM: 138 mmol/L (ref 135–144)

## 2006-06-04 LAB — LIPASE: LIPASE: 36 U/L (ref 10–50)

## 2006-06-04 LAB — US ABDOMEN COMPLETE

## 2006-06-04 LAB — AMYLASE: AMYLASE: 108 U/L (ref 15–133)

## 2006-06-04 MED ORDER — PROTONIX 40 MG PO TBEC
DELAYED_RELEASE_TABLET | ORAL | Status: DC
Start: 2006-06-04 — End: 2007-05-20

## 2006-06-04 MED ORDER — CLARITIN 10 MG PO TABS
ORAL_TABLET | ORAL | Status: DC
Start: 2006-06-04 — End: 2007-12-24

## 2006-06-04 MED ORDER — FLONASE 50 MCG/DOSE NA INHA
NASAL | Status: DC
Start: 2006-06-04 — End: 2007-06-24

## 2006-06-04 MED ORDER — IRBESARTAN 150 MG PO TABS
ORAL_TABLET | ORAL | Status: DC
Start: 2006-06-04 — End: 2006-10-22

## 2006-06-04 MED ORDER — HCA VITAMIN B6 100 MG OR TABS
ORAL_TABLET | ORAL | Status: DC
Start: 2006-06-04 — End: 2006-10-23

## 2006-06-04 NOTE — Progress Notes (Signed)
Entire visit conducted with assistance of [place] medical interpreter.    HPI: Woke up with severe pain in epigastric region on Saturday night. Not burning just "bad pain". Had to get up to bend over to help pain. Slept on stomach subsequently and felt some better but tenderness. Notes she often has a lot of heartburn but this was different. The heartburn is better than it was however, so she has been taking the protonix only prn. No nausea or vomiting. Never had anything like this pain before. Denies any change in bowel habits, hematochezia, or melena.    Other issues:   Cough - returned 4 days agothough this is different - congestion now - did stop completely in between   rhinorrhea and pt. notes sxs increase around animal dander  post-nasal drip. No breathing problems. No chest pains.    Other issues ran out of BP med several days ago and pharmacy refused to refill.    Patient Active Problem List:   TOBACCO USE DISORDER [305.1]   Priority: High [1]   Date Noted: 08/05/2004   Comment: 1/2 PPD since 50 YO - quit 3/07   ABSENCE OF MENSTRUATION - s/p TAH BSO [626.0]   Date Noted: 06/04/2006   HEARTBURN [787.1]   Date Noted: 04/23/2006   ALLERGIC RHINITIS NEC [477.8]   Date Noted: 04/23/2006   COUGH [786.2]   Date Noted: 04/23/2006   DIARRHEA NOS [787.91]   Date Noted: 03/27/2006   ABN FIND-STOOL CONTENTS-OCC BLOOD [792.1]   Date Noted: 03/27/2006   nodules on chest CT 1/06 and 3/07 [518.89]   Date Noted: 03/27/2006   HYPERCALCEMIA [275.42]   Date Noted: 03/13/2006   ABNORMAL LIVER FUNCTION STUDY [794.8]   Date Noted: 03/13/2006   INSOMNIA NOS [780.52]   Date Noted: 03/13/2006   CALCULUS OF KIDNEY [592.0]   Date Noted: 03/13/2006   DEPRESSIVE DISORDER NEC [311]   Date Noted: 03/13/2006   BENIGN HYP HRT DIS W/O HRT FAIL [402.10]   Date Noted: 02/17/2006   LATERAL FOOT PAIN [845.10]   Date Noted: 08/30/2005   Comment: Saw Dr. Forrest Moron, no clear dx 7/06   RECURR DEPR PSYCHOS-MILD [296.31]   Date Noted: 08/05/2004    Comment: TSH wnl 8/02; trial of fluoxetine x 6 wks    didn't really help 8/05   Feels return of depression 2/06 to present,    trial of citalopram   OBESITY NOS [278.00]   Date Noted: 08/05/2004   Comment: has tried meds from [place]     Current outpatient prescriptions prior to 06/04/06:   IRBESARTAN 150 MG OR TABS, 1 tablet twice a day, Disp: 60, Rfl: 5   PROTONIX 40 MG OR TBEC, 1 TABLET BY MOUTH DAILY FOR 8 WEEKS, Disp: 56, Rfl: 3  CLARITIN 10 MG OR TABS, 1 TABLET BY MOUTH DAILY AS NEEDED;NOT MORE THAN ONE TABLET DAILY, Disp: 30, Rfl: 5  AMBIEN 5 MG OR TABS, 1 TABLET AT BEDTIME AS NEEDED, Disp: 15, Rfl: 1    Review of Patient's Allergies indicates:   Lisinopril Cough     Social History   Marital Status: Legally Separated Spouse Name:    Years of Education: Number of children:     Social History Main Topics   Tobacco Use: Quit - 20 pack year hx overall Packs/Day: .5 Years: 40    Alcohol Use: No    Drug Use: No   Social History Narrative   work: Land; owned Brewing technologist in [place]   Lives w/ daughter  24 YO (Grazielle [place]) & son-in-law Kern [place])   Divorced; currently not in a relationship      PE: BP 140/100   Temp 98.3   Wt 208 lbs (94.3kg)   LMP Postmenopausal   Gen - overall healthy appearing except for overweight, no apparent distress alert and oriented  Nodes - no cervical, supraclavicular, epitrochlear, axillary, or inguinal lymphadenopathy - small submandibular nodes vs. glands  Nares - swollen turbinates on right  Oropharynx - nl  CV - RRR no MRG nlS1S2  Pulm - CTA b  Abd - soft and nontender except in epigastric area - there marked tenderness present - no guarding, negative Murphy's sign, no hepatosplenomegaly appreciated   Exts - no c,c,e     Reviewed [place] of liver done today showing fatty liver but nl gallbladder. [place] of liver was done to recheck liver re: question of intermittently abnl LFTs of uncertain etiology    A/P: 50 y/o woman who has had small pulmonary nodules (stable since 2006), long tob hx,  mildly abnl LFTs, hypercalcemia with high PTH and high 1,25OH2 vit D levels plus heme + stool and +PPD - overall doing relatively well but now has had epigastric pain and tenderness for 2-3 days    789.06 ABDOMINAL PAIN EPIGASTRIC (primary encounter diagnosis)  Note: Unclear etiology as pain was main sx - pt. is on rifamate as a new med but also has h/o heartburn sxs - seems to be better now - monitor and check labs - suggested restart daily protonix for now  Plan: HEPATIC FUNCTION PANEL, ROUTINE VENIPUNCTURE,    AMYLASE, LIPASE     - reviewed fatty liver findign on [place] - likely related to obesity and the cause of the patient's abnl LFTs in the past    782.2 LOCAL SUPRFICIAL SWELLNG  Note: right medial calf since 2005 not growing   Plan: monitor only - small scar tissue on exam today    402.10 BENIGN HYP HRT DIS W/O HRT FAIL  Note: pt. has HTN that is currently untreated as she ran out of meds 3 days ago  Plan: ROUTINE VENIPUNCTURE, BASIC METABOLIC PANEL,    IRBESARTAN 478 MG OR TABS   recheck BMP and BP in 2 weeks    Note: reviewed the advice previously given by RN to increase K intake. Advised the opposite to the pt. For this medication it is not advised to eat extra potassium. I explained this to the pt. and she agreed not to purposefully eat extra potassium.    V67.51 EXAM AFTR OTHR HI-RISK COMPL RX NEC  Note: Rifamate  Plan: HEPATIC FUNCTION PANEL       795.5 TUBERCULIN TEST REACTION NO TBC  Note: + PPD  Plan: RIFAMATE 150-300 MG OR CAPS, HCA VITAMIN B6 100   MG OR TABS   Historical entry only     477.8 ALLERGIC RHINITIS NEC  Note: pt. has cough and sxs of allergy to animal dander  Plan: FLONASE 50 MCG/DOSE NA INHA, CLARITIN 10 MG OR    TABS   cough today likely due to post-nasal drip/ allergies    787.1 HEARTBURN  Note: restart daily protonix for now   Plan: PROTONIX 40 MG OR TBEC       RTC IN OCTOBER - REVIEWED UPCOMING APPTS. W/ PATIENT - SHE IS GOING TO HAVE A COLONOSCOPY AND POSSIBLE EGD BEFORE AND A CHEST  CT REPEAT AND F/U WITH DR. Karleen Dolphin.

## 2006-06-11 LAB — CT CHEST WO CONTRAST

## 2006-06-12 ENCOUNTER — Encounter (HOSPITAL_BASED_OUTPATIENT_CLINIC_OR_DEPARTMENT_OTHER): Payer: Self-pay | Admitting: Infectious Disease

## 2006-06-14 ENCOUNTER — Ambulatory Visit (HOSPITAL_BASED_OUTPATIENT_CLINIC_OR_DEPARTMENT_OTHER): Payer: Charity | Admitting: Gastroenterology

## 2006-06-14 DIAGNOSIS — R197 Diarrhea, unspecified: Secondary | ICD-10-CM

## 2006-06-14 DIAGNOSIS — K219 Gastro-esophageal reflux disease without esophagitis: Secondary | ICD-10-CM

## 2006-06-18 ENCOUNTER — Other Ambulatory Visit (HOSPITAL_BASED_OUTPATIENT_CLINIC_OR_DEPARTMENT_OTHER): Payer: Charity | Admitting: Lab

## 2006-06-18 DIAGNOSIS — I119 Hypertensive heart disease without heart failure: Secondary | ICD-10-CM

## 2006-06-18 LAB — BASIC METABOLIC PANEL
ANION GAP: 4 mmol/L (ref 2–25)
BUN (UREA NITROGEN): 17 mg/dl (ref 6–20)
CALCIUM: 9.4 mg/dl (ref 8.6–10.0)
CARBON DIOXIDE: 28 mmol/L (ref 22–32)
CHLORIDE: 108 mmol/L (ref 101–111)
CREATININE: 0.7 mg/dl (ref 0.4–1.2)
Glucose Random: 119 mg/dl (ref 74–160)
POTASSIUM: 3.7 mmol/L (ref 3.5–5.1)
SODIUM: 140 mmol/L (ref 135–144)

## 2006-06-18 LAB — GI CENTER CLINIC NOTE

## 2006-06-18 NOTE — Nursing Note (Signed)
>>   Mariah [name], Mariah [name]     06/18/2006   4:57 pm  Labs drawn.  Dl

## 2006-06-26 ENCOUNTER — Ambulatory Visit (HOSPITAL_BASED_OUTPATIENT_CLINIC_OR_DEPARTMENT_OTHER): Payer: Self-pay | Admitting: Infectious Disease

## 2006-06-26 ENCOUNTER — Encounter (HOSPITAL_BASED_OUTPATIENT_CLINIC_OR_DEPARTMENT_OTHER): Payer: Self-pay | Admitting: Infectious Disease

## 2006-06-28 ENCOUNTER — Encounter (HOSPITAL_BASED_OUTPATIENT_CLINIC_OR_DEPARTMENT_OTHER): Payer: Self-pay | Admitting: Gastroenterology

## 2006-06-28 LAB — OPERATIVE REPORT

## 2006-07-02 LAB — SURGICAL PATH SPECIMEN

## 2006-07-10 ENCOUNTER — Encounter (HOSPITAL_BASED_OUTPATIENT_CLINIC_OR_DEPARTMENT_OTHER): Payer: Charity | Admitting: Pulmonary Disease

## 2006-07-16 ENCOUNTER — Other Ambulatory Visit: Payer: Self-pay | Admitting: Internal Medicine

## 2006-07-24 ENCOUNTER — Encounter (HOSPITAL_BASED_OUTPATIENT_CLINIC_OR_DEPARTMENT_OTHER): Payer: Charity | Admitting: Pulmonary Disease

## 2006-07-25 LAB — TB CULT AND SMEAR (STATE LAB)

## 2006-07-25 LAB — AFB SMEAR
PROCEDURE PROMPT: NEGATIVE
SMEAR, FLUORESCENT/ACID STAI: NEGATIVE

## 2006-07-30 ENCOUNTER — Ambulatory Visit (HOSPITAL_BASED_OUTPATIENT_CLINIC_OR_DEPARTMENT_OTHER): Payer: Charity | Admitting: Gastroenterology

## 2006-07-30 ENCOUNTER — Other Ambulatory Visit (HOSPITAL_BASED_OUTPATIENT_CLINIC_OR_DEPARTMENT_OTHER): Payer: Self-pay | Admitting: Pulmonary Disease

## 2006-07-30 DIAGNOSIS — K2990 Gastroduodenitis, unspecified, without bleeding: Secondary | ICD-10-CM

## 2006-07-30 DIAGNOSIS — K2100 Gastro-esophageal reflux disease with esophagitis, without bleeding: Secondary | ICD-10-CM

## 2006-07-30 DIAGNOSIS — K2970 Gastritis, unspecified, without bleeding: Secondary | ICD-10-CM

## 2006-07-30 DIAGNOSIS — K2980 Duodenitis without bleeding: Secondary | ICD-10-CM

## 2006-07-30 LAB — COMPREHENSIVE METABOLIC PANEL
ALANINE AMINOTRANSFERASE: 22 IU/L (ref 7–35)
ALBUMIN: 4.2 g/dl (ref 3.4–4.8)
ALKALINE PHOSPHATASE: 97 IU/L (ref 25–106)
ANION GAP: 7 mmol/L (ref 2–25)
ASPARTATE AMINOTRANSFERASE: 25 IU/L (ref 8–34)
BILIRUBIN TOTAL: 0.6 mg/dl (ref 0.2–1.1)
BUN (UREA NITROGEN): 12 mg/dl (ref 6–20)
CALCIUM: 9.2 mg/dl (ref 8.6–10.0)
CARBON DIOXIDE: 25 mmol/L (ref 22–32)
CHLORIDE: 103 mmol/L (ref 101–111)
CREATININE: 0.7 mg/dl (ref 0.4–1.2)
Glucose Random: 93 mg/dl (ref 74–160)
POTASSIUM: 3.7 mmol/L (ref 3.5–5.1)
SODIUM: 135 mmol/L (ref 135–144)
TOTAL PROTEIN: 7 g/dl (ref 5.9–7.5)

## 2006-07-30 LAB — TB CULT AND SMEAR (STATE LAB): TB CULTURE STATE LAB: NEGATIVE

## 2006-07-30 LAB — CT CHEST WO CONTRAST

## 2006-07-30 LAB — AFB SMEAR: SMEAR, FLUORESCENT/ACID STAI: NEGATIVE

## 2006-07-30 LAB — CHG ASSAY OF PHOSPHORUS INORGANIC: PHOSPHORUS: 3.3 mg/dl (ref 2.7–4.7)

## 2006-07-31 ENCOUNTER — Encounter (HOSPITAL_BASED_OUTPATIENT_CLINIC_OR_DEPARTMENT_OTHER): Payer: Self-pay | Admitting: Infectious Disease

## 2006-08-02 LAB — PTH INTACT WITH CALCIUM
CALCIUM: 9.5 mg/dL (ref 8.5–10.6)
PTH INTACT: 73 pg/mL — AB (ref 12–65)

## 2006-08-02 LAB — VITAMIN D 1,25-DIHYDROXY: VITAMIN D 1,25-DIHYDROXY: 81.8 pg/mL — AB (ref 15.9–55.6)

## 2006-08-02 LAB — VITAMIN D,25 HYDROXY: VITAMIN D,25 HYDROXY: 45.8 ng/mL (ref 32.0–100.0)

## 2006-08-04 ENCOUNTER — Emergency Department (HOSPITAL_BASED_OUTPATIENT_CLINIC_OR_DEPARTMENT_OTHER): Payer: Self-pay | Admitting: Emergency Medicine

## 2006-08-05 LAB — XR CHEST 2 VIEWS

## 2006-08-06 ENCOUNTER — Emergency Department (HOSPITAL_BASED_OUTPATIENT_CLINIC_OR_DEPARTMENT_OTHER): Payer: Self-pay | Admitting: Emergency Medicine

## 2006-08-06 ENCOUNTER — Encounter (HOSPITAL_BASED_OUTPATIENT_CLINIC_OR_DEPARTMENT_OTHER): Payer: Charity | Admitting: "Endocrinology

## 2006-08-08 LAB — EMERGENCY ROOM NOTE

## 2006-08-09 LAB — EMERGENCY ROOM NOTE

## 2006-08-14 ENCOUNTER — Ambulatory Visit (HOSPITAL_BASED_OUTPATIENT_CLINIC_OR_DEPARTMENT_OTHER): Payer: Self-pay | Admitting: Infectious Disease

## 2006-08-16 LAB — MED SPEC CLINIC NOTE

## 2006-08-21 LAB — GI CENTER CLINIC NOTE

## 2006-08-23 ENCOUNTER — Other Ambulatory Visit (HOSPITAL_BASED_OUTPATIENT_CLINIC_OR_DEPARTMENT_OTHER): Payer: Self-pay | Admitting: Pulmonary Disease

## 2006-08-23 DIAGNOSIS — R05 Cough: Principal | ICD-10-CM

## 2006-08-23 DIAGNOSIS — R059 Cough, unspecified: Secondary | ICD-10-CM

## 2006-08-27 ENCOUNTER — Ambulatory Visit (HOSPITAL_BASED_OUTPATIENT_CLINIC_OR_DEPARTMENT_OTHER): Payer: Self-pay | Admitting: Gastroenterology

## 2006-09-14 LAB — PULMONARY FUNCTION TEST

## 2006-09-18 ENCOUNTER — Ambulatory Visit (HOSPITAL_BASED_OUTPATIENT_CLINIC_OR_DEPARTMENT_OTHER): Payer: Self-pay | Admitting: Infectious Disease

## 2006-09-18 DIAGNOSIS — Z111 Encounter for screening for respiratory tuberculosis: Secondary | ICD-10-CM

## 2006-09-18 LAB — TRANSFERASE ALANINE AMINO ALT SGPT: ALANINE AMINOTRANSFERASE: 40 IU/L — ABNORMAL HIGH (ref 7–35)

## 2006-10-22 ENCOUNTER — Encounter (HOSPITAL_BASED_OUTPATIENT_CLINIC_OR_DEPARTMENT_OTHER): Payer: Self-pay | Admitting: Internal Medicine

## 2006-10-22 ENCOUNTER — Ambulatory Visit (HOSPITAL_BASED_OUTPATIENT_CLINIC_OR_DEPARTMENT_OTHER): Payer: Charity | Admitting: Internal Medicine

## 2006-10-22 VITALS — BP 146/98 | HR 71 | Temp 97.7°F | Ht 63.5 in | Wt 211.0 lb

## 2006-10-22 DIAGNOSIS — R197 Diarrhea, unspecified: Secondary | ICD-10-CM

## 2006-10-22 DIAGNOSIS — F329 Major depressive disorder, single episode, unspecified: Secondary | ICD-10-CM

## 2006-10-22 DIAGNOSIS — I119 Hypertensive heart disease without heart failure: Secondary | ICD-10-CM

## 2006-10-22 DIAGNOSIS — F3289 Other specified depressive episodes: Secondary | ICD-10-CM

## 2006-10-22 DIAGNOSIS — R12 Heartburn: Secondary | ICD-10-CM

## 2006-10-22 DIAGNOSIS — Z23 Encounter for immunization: Secondary | ICD-10-CM

## 2006-10-22 LAB — COMPREHENSIVE METABOLIC PANEL
ALANINE AMINOTRANSFERASE: 46 IU/L — ABNORMAL HIGH (ref 7–35)
ALBUMIN: 4.5 g/dl (ref 3.4–4.8)
ALKALINE PHOSPHATASE: 155 IU/L — ABNORMAL HIGH (ref 25–106)
ANION GAP: 9 mmol/L (ref 2–25)
ASPARTATE AMINOTRANSFERASE: 40 IU/L — ABNORMAL HIGH (ref 8–34)
BILIRUBIN TOTAL: 0.6 mg/dl (ref 0.2–1.1)
BUN (UREA NITROGEN): 13 mg/dl (ref 6–20)
CALCIUM: 9.8 mg/dl (ref 8.6–10.0)
CARBON DIOXIDE: 28 mmol/L (ref 22–32)
CHLORIDE: 104 mmol/L (ref 101–111)
CREATININE: 0.7 mg/dl (ref 0.4–1.2)
Glucose Random: 84 mg/dl (ref 74–160)
POTASSIUM: 3.7 mmol/L (ref 3.5–5.1)
SODIUM: 141 mmol/L (ref 135–144)
TOTAL PROTEIN: 7.6 g/dl — ABNORMAL HIGH (ref 5.9–7.5)

## 2006-10-22 LAB — THYROID SCREEN TSH REFLEX FT4: THYROID SCREEN TSH REFLEX FT4: 1.45 u[IU]/mL (ref 0.34–5.60)

## 2006-10-22 MED ORDER — METAMUCIL 1.7 G PO WAFR
WAFER | ORAL | Status: AC
Start: 2006-10-22 — End: 2007-04-22

## 2006-10-22 MED ORDER — IRBESARTAN 150 MG PO TABS
ORAL_TABLET | ORAL | Status: DC
Start: 2006-10-22 — End: 2006-10-23

## 2006-10-22 MED ORDER — FLUOXETINE HCL 20 MG PO TABS
ORAL_TABLET | ORAL | Status: DC
Start: 2006-10-22 — End: 2007-02-15

## 2006-10-22 NOTE — Progress Notes (Signed)
Given flu and tdap vaccines as ordered after vis sheet reviewed via [place] interpreter.dt,rn

## 2006-10-22 NOTE — Progress Notes (Signed)
Entire visit conducted with assistance of [place] medical interpreter,Marcos.    Issues:    # Diarrhea - 6 times per day - regular stools except for loose. no cramps. gets better when eats cereal and milk b/c adds more bulk.    no weight loss but problems with weight gain.   denies acid/ heartburn now. had not understood that the protonix was to be BID for 6 months and then once daily.    # Lack of energy/motivation - ? if related to depression  pt reported she has trouble with energy/ motivation. once I asked about depression, she began crying in clinic and reports that she knows this is a problem. she reviewed that one thing that preoccupies her is thinking about how she needs to go back to [place] to see her mom who is 66 years old but that this means potentially not seeing her daughters and grandchild again b/c they are in [place]   - reviewed that she is not suicidal and still considers life "beautiful" and would not consider suicide - also is religious  - pt does not want to talk with anyone about her feelings - feels embarassed and expressed that she thinks she must deal with things by herself  - she notes that she is not able to enjoy things often due to this   - trouble sleeping and overeating also    # High calcium - pt missed appt with Dr. Jeannine Kitten b/c of feeling poorly that day and reports she tried to call the office to reschedule but did not    - I see that her vit D1,25 level was higher in August - but vit D 25 OH level.    # Cough - pt reports that her cough has totally resolved     # BP - pt reports that her BP is high at home - she has home monitor - 140/100 often - she notes that it goes up when she is stressed    ROS: weight gain - eating a lot  no fevers, no chills, no night sweats   no cough  no chest pains  no breathign problems  diarrhea but no abd pain   no blood in stools        Patient Active Problem List:   TOBACCO USE DISORDER [305.1]   Priority: High [1]   Date Noted: 08/05/2004   Comment:  1/2 PPD since 50 YO - quit 3/07   HYPERPARATHYROIDISM [252.0]   Date Noted: 10/22/2006   ABSENCE OF MENSTRUATION - s/p TAH BSO [626.0]   Date Noted: 06/04/2006   TUBERCULIN TEST REACTION NO TBC [795.5]   Date Noted: 06/04/2006   Comment: + PPD - S/P 4 MONTHS OF RIFAMATE IN 2007   HEARTBURN [787.1]   Date Noted: 04/23/2006   Comment: The upper endoscopy 7/07 showed evidence    of a hiatus    hernia, esophagitis, gastritis, and    duodenitis   A biopsy of the antrum to rule out H.    pylori was also negative.      ALLERGIC RHINITIS NEC [477.8]   Date Noted: 04/23/2006   COUGH [786.2]   Date Noted: 04/23/2006   Comment: s/p PFTs September 2007 -- some    obstruction at mid to low lung volumes not    correctable by bronchodilator   CT scan showed stable pulmonary nodules    over several years   11/07 - cough gone  DIARRHEA NOS [787.91]   Date Noted: 03/27/2006   Comment: 7/07 The colonoscopy was visually normal    and biopsies taken to rule out microscopic    colitis were negative. A biopsy of the    descending    duodenum to rule out celiac sprue because    of the diarrhea was negative.    ABN FIND-STOOL CONTENTS-OCC BLOOD [792.1]   Date Noted: 03/27/2006   Comment: s/p colonoscopy and EGD 7/07   nodules on chest CT 1/06 and 3/07 [518.89]   Date Noted: 03/27/2006   HYPERCALCEMIA [275.42]   Date Noted: 03/13/2006   ABNORMAL LIVER FUNCTION STUDY [794.8]   Date Noted: 03/13/2006   Comment: LIKELY DUE TO FATTY LIVER - SEE [place] OF    06/04/06. - HEP B AND C   INSOMNIA NOS [780.52]   Date Noted: 03/13/2006   CALCULUS OF KIDNEY [592.0]   Date Noted: 03/13/2006   DEPRESSIVE DISORDER NEC [311]   Date Noted: 03/13/2006   BENIGN HYP HRT DIS W/O HRT FAIL [402.10]   Date Noted: 02/17/2006   LATERAL FOOT PAIN [845.10]   Date Noted: 08/30/2005   Comment: Saw Dr. 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 Little interest or pleasure in doing things  everyday    2. Feeling down, depressed, or hopeless  some days    3. Trouble falling asleep, staying asleep, or sleeping too much  none    4. Feeling tired or having low energy  every day    5. Poor appetite or overeating  every day (overeating)    6. Feelign bad about yourself- or that you are a failure or have let yoursel fo your family down.  never    7. Trouble conenctrating on things, such as reading the newspaper or watching television.  everyday    8. Moving or speaking so slowly that other people could have noticed. Or the opposite - being so fidgety or restless that you have been moving around a lot more than usual.  everyday    9. Thoughts that you would be better off dead or of hurting yourself in some way.  never     TOTAL = 16    patient reports Psych sxs not interfering with work but other aspects of life.     HEENT -  no thyroid nodules/ neck masses appreciated   CV - RRR no mRG nlS1S2  Pulm - CTAb   Exts - no edema  Abd - soft and non-tender    A/P: 50 y/o woman overall doing well except for depression  and  diarrhea    787.91 DIARRHEA NOS (primary encounter diagnosis)  Note: intermittent/ chronic. pt reports improves with cereal so not lactose intolerance. no weigh tloss so seems unlikely to be related to malabsorption. asked pt if taking diet pills and not. note normal biopsies on endoscopy and colonoscopy. noted normal VIP level. could be anxiety related.  Plan: ROUTINE VENIPUNCTURE, COMPREHENSIVE METABOLIC    PANEL, OVA AND PARASITES, THYROID SCREEN TSH,    SPECIMEN FAT STAIN, METAMUCIL 1.7 GM OR WAFR   advised pt use of fiber bulking agent for now.    787.1 HEARTBURN  Note: reviewed need for her to increase dose due to hiatal hernia and esophagitis.  Plan: PROTONIX 40 MG OR TBEC   bid until approx March    252.0 HYPERPARATHYROIDISM  Note: reviewed labs - suspect pt's high vit D 1,25OH related to this and that pt needs follow-up with Endocrinologist - reviewed with pt need for follow-up - noted to her that depressive sxs can be related to hyperparathyroidism.  Plan: ROUTINE VENIPUNCTURE, PTH INTACT WITH CALCIUM,    REFERRAL TO ENDOCRINOLOGY (INT)       311 DEPRESSIVE DISORDER NEC  Note: reviewed sxs and reviewed need for urgent/ emergent f/u if any thoughts of suicide occur - esp after starting prozac. reviewed option of psychotherapy and offered to help pt obtain this if she changes her mind. encouraged her to exercise (has home treadmill)  Plan: ROUTINE VENIPUNCTURE, THYROID SCREEN TSH,    FLUOXETINE HCL 20 MG OR TABS   recheck in 4 to 6 weeks.    402.10 BENIGN HYP HRT DIS W/O HRT FAIL  Note: reviewed w/ pt that her home BP is also high - reviewed need to reduce dietary salt - gave her nutrition handout in [place] - will try this and exercise first.  Plan: IRBESARTAN 150 MG OR TABS   continue at max dose of this med for now - in future consider adding norvasc.    V04.81 VACCINE FOR INFLUENZA  Note: reviewed w/ pt  Plan: FLU VACCINE, 3 YRS & >, IM, IMMUNIZATION ADMIN    SINGLE, RN       V06.1 VACCINE FOR  DIPTH/TET/PERTUSS  Note: reviewed w/ pt - once in lifetime then Td every 10 years  Plan: TDAP VACCINE IM, IMMUNIZATION ADMIN EACH ADD,    RN     RTC IN 4 TO 6 WEEKS OR PRN EARLIER.

## 2006-10-23 ENCOUNTER — Other Ambulatory Visit (HOSPITAL_BASED_OUTPATIENT_CLINIC_OR_DEPARTMENT_OTHER): Payer: Self-pay | Admitting: Ambulatory Care

## 2006-10-23 DIAGNOSIS — I119 Hypertensive heart disease without heart failure: Secondary | ICD-10-CM

## 2006-10-23 NOTE — Telephone Encounter (Signed)
patient had cough with lisinopril. requires an ARB now. On [facility] Formulary listing I think there is no preference regarding ARBs. If so, the PA for [facility] is pretty straightforward.

## 2006-10-23 NOTE — Telephone Encounter (Signed)
Fax that [facility] requires PA. Will forward to pcp to review and advise.

## 2006-10-23 NOTE — Telephone Encounter (Addendum)
Comment: PA done    PA done - will give to [facility] today for faxing.

## 2006-10-23 NOTE — Telephone Encounter (Signed)
Forward to sec. To assist w/initiating PA and leave for PCP to complete clinical portions.

## 2006-10-23 NOTE — Telephone Encounter (Addendum)
Addended by: Blane Ohara on: 10/23/2006 2:11:02 PM     Modules accepted: Orders, Medications

## 2006-10-23 NOTE — Telephone Encounter (Signed)
In [employer] mailbox waiting to be signed then can fax [facility]  ----- Message -----      Per sec. PA initiated and fowarded to PCP to complete clinical sections, sign and leave for faxing.

## 2006-10-24 ENCOUNTER — Telehealth (HOSPITAL_BASED_OUTPATIENT_CLINIC_OR_DEPARTMENT_OTHER): Payer: Self-pay | Admitting: Ambulatory Care

## 2006-10-24 ENCOUNTER — Encounter (HOSPITAL_BASED_OUTPATIENT_CLINIC_OR_DEPARTMENT_OTHER): Payer: Self-pay | Admitting: Internal Medicine

## 2006-10-24 LAB — PTH INTACT WITH CALCIUM
CALCIUM: 10.1 mg/dL (ref 8.5–10.6)
PTH INTACT: 75 pg/mL — AB (ref 12–65)

## 2006-10-24 NOTE — Telephone Encounter (Addendum)
Comment: PA approved,will scan to record

## 2006-10-29 ENCOUNTER — Encounter (HOSPITAL_BASED_OUTPATIENT_CLINIC_OR_DEPARTMENT_OTHER): Payer: Self-pay | Admitting: "Endocrinology

## 2006-10-29 DIAGNOSIS — M810 Age-related osteoporosis without current pathological fracture: Secondary | ICD-10-CM

## 2006-10-29 DIAGNOSIS — E213 Hyperparathyroidism, unspecified: Secondary | ICD-10-CM

## 2006-10-30 LAB — MEDICAL SPECIALTIES LETTER

## 2006-11-12 ENCOUNTER — Other Ambulatory Visit (HOSPITAL_BASED_OUTPATIENT_CLINIC_OR_DEPARTMENT_OTHER): Payer: Self-pay | Admitting: "Endocrinology

## 2006-11-12 LAB — NM PARATHYROID IMAGING

## 2006-11-19 ENCOUNTER — Encounter (HOSPITAL_BASED_OUTPATIENT_CLINIC_OR_DEPARTMENT_OTHER): Payer: PRIVATE HEALTH INSURANCE | Admitting: "Endocrinology

## 2006-11-19 ENCOUNTER — Encounter (HOSPITAL_BASED_OUTPATIENT_CLINIC_OR_DEPARTMENT_OTHER): Payer: Self-pay | Admitting: Internal Medicine

## 2006-11-19 LAB — MEDICAL SPECIALTIES LETTER

## 2006-11-26 ENCOUNTER — Ambulatory Visit (HOSPITAL_BASED_OUTPATIENT_CLINIC_OR_DEPARTMENT_OTHER): Payer: PRIVATE HEALTH INSURANCE | Admitting: Internal Medicine

## 2006-11-26 VITALS — BP 144/100 | HR 90 | Temp 98.3°F | Wt 209.0 lb

## 2006-11-26 DIAGNOSIS — F329 Major depressive disorder, single episode, unspecified: Principal | ICD-10-CM

## 2006-11-26 DIAGNOSIS — I119 Hypertensive heart disease without heart failure: Secondary | ICD-10-CM

## 2006-11-26 DIAGNOSIS — S93609A Unspecified sprain of unspecified foot, initial encounter: Secondary | ICD-10-CM

## 2006-11-26 DIAGNOSIS — F3289 Other specified depressive episodes: Secondary | ICD-10-CM

## 2006-11-26 MED ORDER — IRBESARTAN 150 MG PO TABS
ORAL_TABLET | ORAL | Status: DC
Start: 2006-11-26 — End: 2007-11-13

## 2006-11-26 NOTE — Progress Notes (Signed)
Entire visit conducted with assistance of [place] medical interpreter,Mariah [name].    Issues reviewed:    # BP - pt ran out of meds 6 days ago - pharmacy told her she would have to pay for it   - she and I reviewed that there must have been some confusion b/c the prior auth was completed - handed pt copy of the approval letter to give to the pharmacy  - denies chest pains/ pressure, dyspnea, sudden vision changes, headaches    # Depression - pt reports feeling much much better now - no trouble sleeping and no sadness - the medication has really helped     # Left ankle pain - intermittent - we reviewed - she was seen by Dr. Forrest Moron in the past and was advised to wear orthotics - she has not done this consistently      Patient Active Problem List:   TOBACCO USE DISORDER [305.1]   Priority: High [1]   Date Noted: 08/05/2004   Comment: 1/2 PPD since 50 YO - quit 3/07   HYPERPARATHYROIDISM [252.0]   Date Noted: 10/22/2006   Comment: osteoporosis found - pt referred to surgery    for the hyperparathyroidism   ABSENCE OF MENSTRUATION - s/p TAH BSO [626.0]   Date Noted: 06/04/2006   TUBERCULIN TEST REACTION NO TBC [795.5]   Date Noted: 06/04/2006   Comment: + PPD - S/P 4 MONTHS OF RIFAMATE IN 2007   HEARTBURN [787.1]   Date Noted: 04/23/2006   Comment: The upper endoscopy 7/07 showed evidence    of a hiatus    hernia, esophagitis, gastritis, and    duodenitis   A biopsy of the antrum to rule out H.    pylori was also negative.      ALLERGIC RHINITIS NEC [477.8]   Date Noted: 04/23/2006   COUGH [786.2]   Date Noted: 04/23/2006   Comment: s/p PFTs September 2007 -- some    obstruction at mid to low lung volumes not    correctable by bronchodilator   CT scan showed stable pulmonary nodules    over several years   11/07 - cough gone         DIARRHEA NOS [787.91]   Date Noted: 03/27/2006   Comment: 7/07 The colonoscopy was visually normal    and biopsies taken to rule out microscopic    colitis were negative. A biopsy of  the    descending    duodenum to rule out celiac sprue because    of the diarrhea was negative.    ABN FIND-STOOL CONTENTS-OCC BLOOD [792.1]   Date Noted: 03/27/2006   Comment: s/p colonoscopy and EGD 7/07   nodules on chest CT 1/06 and 3/07 [518.89]   Date Noted: 03/27/2006   HYPERCALCEMIA [275.42]   Date Noted: 03/13/2006   ABNORMAL LIVER FUNCTION STUDY [794.8]   Date Noted: 03/13/2006   Comment: LIKELY DUE TO FATTY LIVER - SEE [place] OF    06/04/06. - HEP B AND C   INSOMNIA NOS [780.52]   Date Noted: 03/13/2006   CALCULUS OF KIDNEY [592.0]   Date Noted: 03/13/2006   DEPRESSIVE DISORDER NEC [311]   Date Noted: 03/13/2006   BENIGN HYP HRT DIS W/O HRT FAIL [402.10]   Date Noted: 02/17/2006   LATERAL FOOT PAIN [845.10]   Date Noted: 08/30/2005   Comment: Saw Dr. Forrest Moron, no clear dx 7/06   RECURR DEPR PSYCHOS-MILD [296.31]   Date Noted: 08/05/2004   Comment: TSH wnl  8/02; trial of fluoxetine x 6 wks    didn't really help 8/05   Feels return of depression 2/06 to present,    trial of citalopram   OBESITY NOS [278.00]   Date Noted: 08/05/2004   Comment: has tried meds from [place]    Current outpatient prescriptions: IRBESARTAN 150 MG OR TABS, 1 tablet twice a day - THIS WAS APPROVED BY [facility] Disp: 60, Rfl: 5; PROTONIX 40 MG OR TBEC, 1 TABLET BY MOUTH TWICE A DAY UNTIL FEBRUARY 2008 AND THEN EVERY DAY ONCE A DAY, Disp: 56, Rfl: 0; METAMUCIL 1.7 GM OR WAFR, 1 to 2 wafers daily with 12 ounces of fluids, Disp: 60, Rfl: 5; FLUOXETINE HCL 20 MG OR TABS, 1 TABLET DAILY, Disp: 30, Rfl: 5  FLONASE 50 MCG/DOSE NA INHA, 1 spray to each nostril daily, Disp: 1, Rfl: 3; CLARITIN 10 MG OR TABS, 1 TABLET BY MOUTH DAILY AS NEEDED;NOT MORE THAN ONE TABLET DAILY, Disp: 30, Rfl: 5    Review of Patient's Allergies indicates:   Lisinopril Cough   Hydrochlorothiazide    Comment: high calcium    Social History Narrative   work: Land; owned Brewing technologist in [place]   Lives w/ daughter 24 YO (Grazielle Greenfield) & son-in-law Kern [place])    Divorced; currently not in a relationship        PE: BP 144/100   Pulse 90   Temp 98.3   Wt 209 lbs (94.8kg)   LMP Postmenopausal   BP high but pt on no meds for 6 days  Gen - overall healthy appearing except weight, no apparent distress, alert and oriented  HEENT - normal carotids bilaterally without bruits  CV - RRR no MRG [id number]  Psych - doing well by PHQ9 with score of 0 now - cheerful appearing   Exts - no abnl left ankle - normal ROM, non-tender, no erythema    A/P: 50 y/o woman with   311 DEPRESSIVE DISORDER NEC (primary encounter diagnosis)  Note: pt doing much better - advised her not to stop meds at this point as she is doing well and we usually would continue them for at least 6 months during the first depressive episode  Plan: cont Prozac and recheck in 4 to 5 months.    402.10 BENIGN HYP HRT DIS W/O HRT FAIL  Note: pt was with better BP control on irbesartan but ran out- advised pt not to let it run out in future - to call us for assistance if needed  Plan: IRBESARTAN 150 MG OR TABS   to continue     Lateral Foot Pain:  Note: LEFT ANKLE - no abnls today on exam  Plan: advised she use orthotics as recommended by Dr. Forrest Moron    RTC in 4 months

## 2006-12-22 ENCOUNTER — Emergency Department: Payer: Self-pay

## 2006-12-22 LAB — CHG CREATININE BLOOD: CREATININE: 0.7 mg/dl (ref 0.4–1.2)

## 2006-12-22 LAB — ASSAY OF UREA NITROGEN QUANTITATIVE: BUN (UREA NITROGEN): 17 mg/dl (ref 6–20)

## 2006-12-23 LAB — CT PELVIS W & WO IV CONTRAST

## 2006-12-23 LAB — CT ABDOMEN W & WO IV CONTRAST

## 2006-12-24 ENCOUNTER — Telehealth (HOSPITAL_BASED_OUTPATIENT_CLINIC_OR_DEPARTMENT_OTHER): Payer: Self-pay | Admitting: Infectious Diseases

## 2006-12-24 NOTE — Telephone Encounter (Signed)
tc via interpreter    Went to ED 12/22/06 Had CT scan. continues w/constant pain on L side of low back.rates Pain 4/10  Motrin prescribed but can't take due to stomach prob. Taking Tylenol w/minimal effect.    Call cut off as pt entered a tunnel. Await return call.    Appt. Offered for today but pt declines due to work sched.  Appt given for tomorrow aft. But advised to go back to ED should sx worsen before then.

## 2006-12-24 NOTE — Telephone Encounter (Signed)
Staff Message copied by Bevelyn Buckles on 12/24/2006 at 2:01 PM  ------   Message from: Merrilee Jansky, RITA   Created: 12/24/2006 at 1:56 PM   Regarding: triage er fu kidney pain   Contact: [phone]    DESEREA BORDLEY [id number], 51 year old, female, Telephone Information:  Home Phone [phone]  Work Phone [phone]  Mobile [phone]      CALL BACK NUMBER:   Cell phone:   Other phone:    Available times:    Patient's language of care: [place]    Patient does not need an interpreter.    Patient's PCP: Ebbie Ridge. Lisette Grinder, MD    Person calling on behalf of patient: patient (self)    Calls today   Pt went to er and needs to be seen,pain is strong ,doesn't go away    Patient's Preferred Pharmacy:   No Pharmacies Listed

## 2006-12-25 ENCOUNTER — Ambulatory Visit (HOSPITAL_BASED_OUTPATIENT_CLINIC_OR_DEPARTMENT_OTHER): Payer: PRIVATE HEALTH INSURANCE | Admitting: Internal Medicine

## 2006-12-25 VITALS — BP 150/100 | Temp 98.0°F

## 2006-12-25 DIAGNOSIS — M549 Dorsalgia, unspecified: Secondary | ICD-10-CM

## 2006-12-25 DIAGNOSIS — N200 Calculus of kidney: Secondary | ICD-10-CM

## 2006-12-25 LAB — URINALYSIS
BACTERIA: <10 PER HPF — AB (ref 0–?)
BILIRUBIN, URINE: NEGATIVE
CASTS: NONE SEEN PER LPF
CRYSTALS: NONE SEEN
GLUCOSE, URINE: NEGATIVE MG/DL
KETONE, URINE: NEGATIVE MG/DL
LEUKOCYTE ESTERASE: NEGATIVE
NITRITE, URINE: NEGATIVE
PH URINE: 5.5 (ref 5.0–8.0)
PROTEIN, URINE: NEGATIVE MG/DL
SPECIFIC GRAVITY URINE: 1.025 (ref 1.003–1.035)

## 2006-12-25 LAB — URINE DIP (POINT OF CARE)
BILIRUBIN, URINE: NEGATIVE mg/dl (ref 0–0)
GLUCOSE, URINE: NORMAL mg/dl (ref 0–0)
KETONE, URINE: NEGATIVE mg/dl (ref 0–0)
LEUKOCYTE ESTERASE: NEGATIVE Leu/mcl (ref 0–0)
NITRITE, URINE: NEGATIVE
OCCULT BLOOD, URINE: 0.06 mg/dl — AB (ref 0–0)
PH URINE: 5.5 (ref 5.0–8.0)
PROTEIN, URINE: NEGATIVE mg/dl (ref 0–15)
SPECIFIC GRAVITY URINE: 0.015 — AB (ref 1.003–1.030)
UROBILINOGEN URINE: NORMAL mg/dl (ref 0.2–1.0)

## 2006-12-25 MED ORDER — FLEXERIL 5 MG PO TABS
ORAL_TABLET | ORAL | Status: AC
Start: 2006-12-25 — End: 2007-01-04

## 2006-12-25 MED ORDER — OXYCODONE HCL 5 MG PO TABS
ORAL_TABLET | ORAL | Status: AC
Start: 2006-12-25 — End: 2007-01-08

## 2006-12-26 LAB — URINE CULTURE/COLONY COUNT

## 2006-12-26 NOTE — Progress Notes (Signed)
Entire visit conducted with assistance of [place] medical interpreter,Marcos Pienasola.    Reason for visit: pain present after being seen in ER over the last weekend    HPI: Patient presents to clinic with c/o persistent left sided back pain that is crampy and very painful/ severe. No improvement since going to ER in which she was told it was likely kidney stones. Seems to hurt when she feels urge to urinate and she is urinating more frequently. Also notes that there is a lot of pain when she twists her low back. Yesterday she had some pain seem to extend down her leg but there is none now and there are no paresthesias, leg weakness, or other sxs suggestive of sciatica. Denies fevers or chills.    No change in bowel habits.     ROS: no chest pains    Patient Active Problem List:   TOBACCO USE DISORDER [305.1]   Priority: High [1]   Date Noted: 08/05/2004   Comment: 1/2 PPD since 51 YO - quit 3/07   HYPERPARATHYROIDISM [252.0]   Date Noted: 10/22/2006   Comment: osteoporosis found - pt referred to surgery    for the hyperparathyroidism   ABSENCE OF MENSTRUATION - s/p TAH BSO [626.0]   Date Noted: 06/04/2006   TUBERCULIN TEST REACTION NO TBC [795.5]   Date Noted: 06/04/2006   Comment: + PPD - S/P 4 MONTHS OF RIFAMATE IN 2007   HEARTBURN [787.1]   Date Noted: 04/23/2006   Comment: The upper endoscopy 7/07 showed evidence    of a hiatus    hernia, esophagitis, gastritis, and    duodenitis   A biopsy of the antrum to rule out H.    pylori was also negative.      ALLERGIC RHINITIS NEC [477.8]   Date Noted: 04/23/2006   COUGH [786.2]   Date Noted: 04/23/2006   Comment: s/p PFTs September 2007 -- some    obstruction at mid to low lung volumes not    correctable by bronchodilator   CT scan showed stable pulmonary nodules    over several years   11/07 - cough gone         DIARRHEA NOS [787.91]   Date Noted: 03/27/2006   Comment: 7/07 The colonoscopy was visually normal    and biopsies taken to rule out microscopic     colitis were negative. A biopsy of the    descending    duodenum to rule out celiac sprue because    of the diarrhea was negative.    ABN FIND-STOOL CONTENTS-OCC BLOOD [792.1]   Date Noted: 03/27/2006   Comment: s/p colonoscopy and EGD 7/07   nodules on chest CT 1/06 and 3/07 [518.89]   Date Noted: 03/27/2006   HYPERCALCEMIA [275.42]   Date Noted: 03/13/2006   ABNORMAL LIVER FUNCTION STUDY [794.8]   Date Noted: 03/13/2006   Comment: LIKELY DUE TO FATTY LIVER - SEE [place] OF    06/04/06. - HEP B AND C   INSOMNIA NOS [780.52]   Date Noted: 03/13/2006   CALCULUS OF KIDNEY [592.0]   Date Noted: 03/13/2006   DEPRESSIVE DISORDER NEC [311]   Date Noted: 03/13/2006   BENIGN HYP HRT DIS W/O HRT FAIL [402.10]   Date Noted: 02/17/2006   LATERAL FOOT PAIN [845.10]   Date Noted: 08/30/2005   Comment: Saw Dr. Forrest Moron, no clear dx 7/06   RECURR DEPR PSYCHOS-MILD [296.31]   Date Noted: 08/05/2004   Comment: TSH wnl 8/02; trial of fluoxetine  x 6 wks    didn't really help 8/05   Feels return of depression 2/06 to present,    trial of citalopram   OBESITY NOS [278.00]   Date Noted: 08/05/2004   Comment: has tried meds from [place]    Current outpatient prescriptions prior to 12/25/06:  IRBESARTAN 150 MG OR TABS, 1 tablet twice a day - THIS WAS APPROVED BY [facility] Disp: 60, Rfl: 5  PROTONIX 40 MG OR TBEC, 1 TABLET BY MOUTH TWICE A DAY UNTIL FEBRUARY 2008 AND THEN EVERY DAY ONCE A DAY, Disp: 56, Rfl: 0  METAMUCIL 1.7 GM OR WAFR, 1 to 2 wafers daily with 12 ounces of fluids, Disp: 60, Rfl: 5  FLUOXETINE HCL 20 MG OR TABS, 1 TABLET DAILY, Disp: 30, Rfl: 5  FLONASE 50 MCG/DOSE NA INHA, 1 spray to each nostril daily, Disp: 1, Rfl: 3  CLARITIN 10 MG OR TABS, 1 TABLET BY MOUTH DAILY AS NEEDED;NOT MORE THAN ONE TABLET DAILY, Disp: 30, Rfl: 5    Review of Patient's Allergies indicates:   Lisinopril Cough   Hydrochlorothiazide    Comment: high calcium      PE:BP 150/100   Temp 98   LMP Postmenopausal   (BP high but patient in pain at present  time)  Gen - overall healthy appearing, no apparent distress except when tried to move up and down on table and then seemed to have pain exacerbation left lower back  CV - RRR no MRG   Pulm - CTAb  Abd - soft and nontender  CVA - slightly tender over left CVA area   Back - no vertebral tenderness      Urine dip + blood    Reviewed CT of Saturday (while ER) and the official interpretation was actually with no kidney stones    A/P: 51 y/o woman with   592.0 CALCULUS OF KIDNEY (primary encounter diagnosis)  Note: Despite the normal CT scan I think she likely had a kidney stone which passed and is still suffering the pain that follows from inflammation of the ureter. She definitely has risk factors for stones and she has a known definite h/o stones. no overt signs or sxs of infection which is reassuring. advised plenty of fluids and will try to ensure no UTI present  Plan: URINE DIP (POINT OF CARE), URINALYSIS, URINE    CULTURE/COLONY COUNT   monitor for now otherwise and treat pain    724.5 BACKACHE NOS  Note: likely renal colic though the pain with movement of the low back (twisting especially) seemed a little characteristic - there really was no tenderness though over the paraspinal muscles and the patient gave an otherwise characteristic description of the pain for renal colic  Plan: FLEXERIL 5 MG OR TABS, OXYCODONE HCL 5 MG OR    TABS   advised re: caution not to drive under influence of meds and not to overuse or take together due to sedation/ addictive properties (of note patient never filled percocet scrip from ER b/c she did not realize she was given a scrip -she will go home and throw it away now)

## 2006-12-28 ENCOUNTER — Telehealth (HOSPITAL_BASED_OUTPATIENT_CLINIC_OR_DEPARTMENT_OTHER): Payer: Self-pay | Admitting: Internal Medicine

## 2006-12-28 NOTE — Telephone Encounter (Signed)
Called pt first with [employer] interpreter who could not use the phones correctly so we spent almost 4 minutes w/o reaching the pt. So, called pt back w/o interpreter and spoke via son-in-law with pt present. Patient doing much better. Reviewed meds and use of meds. Pt not needing either. Rev'd that she could use Tylenol if pain almost gone. Seems that she likely passed stone before going to ER and then the pain later was residual irritation of the ureter that is now gone. Labs were not revealing especially this time. No further w/u needed.

## 2006-12-29 LAB — EMERGENCY ROOM NOTE

## 2007-01-16 ENCOUNTER — Other Ambulatory Visit (HOSPITAL_BASED_OUTPATIENT_CLINIC_OR_DEPARTMENT_OTHER): Payer: PRIVATE HEALTH INSURANCE | Admitting: Surgery

## 2007-01-18 LAB — BASIC METABOLIC PANEL
ANION GAP: 8 mmol/L (ref 2–25)
BUN (UREA NITROGEN): 18 mg/dl (ref 6–20)
CALCIUM: 9.6 mg/dl (ref 8.6–10.0)
CARBON DIOXIDE: 26 mmol/L (ref 22–32)
CHLORIDE: 107 mmol/L (ref 101–111)
CREATININE: 0.7 mg/dl (ref 0.4–1.2)
Glucose Random: 80 mg/dl (ref 74–160)
POTASSIUM: 4.3 mmol/L (ref 3.5–5.1)
SODIUM: 141 mmol/L (ref 135–144)

## 2007-01-18 LAB — BLOOD COUNT COMPLETE AUTOMATED
HEMATOCRIT: 39.8 % (ref 36.0–48.0)
HEMOGLOBIN: 13.1 g/dl (ref 12.0–16.0)
MEAN CORP HGB CONC: 32.9 g/dl (ref 32.0–36.0)
MEAN CORPUSCULAR HGB: 26.9 pg — ABNORMAL LOW (ref 27.0–33.0)
MEAN CORPUSCULAR VOL: 81.6 fl (ref 80.0–100.0)
MEAN PLATELET VOLUME: 7.6 fl (ref 6.4–10.8)
PLATELET COUNT: 296 10*3/uL (ref 150–400)
RBC DISTRIBUTION WIDTH: 13.8 % (ref 11.5–14.3)
RED BLOOD CELL COUNT: 4.88 M/uL (ref 4.50–5.10)
WHITE BLOOD CELL COUNT: 4.7 10*3/uL (ref 4.0–10.8)

## 2007-01-18 LAB — APTT: APTT: 32 SECONDS (ref 23.7–33.3)

## 2007-01-18 LAB — TYPE AND SCREEN

## 2007-01-18 LAB — PROTHROMBIN TIME
INR: 1 — ABNORMAL LOW (ref 2.0–3.5)
PROTHROMBIN TIME: 10.1 SECONDS (ref 9.5–11.5)

## 2007-01-18 LAB — PATIENT ABO/RH CONFIRM

## 2007-01-18 LAB — EKG

## 2007-01-21 ENCOUNTER — Inpatient Hospital Stay (HOSPITAL_BASED_OUTPATIENT_CLINIC_OR_DEPARTMENT_OTHER): Payer: Self-pay | Admitting: Surgery

## 2007-01-21 LAB — INTRAOPERATIVE INTACT PTH
INTRAOPERATIVE INTACT PTH: 33.647 pg/mL (ref 10–65)
INTRAOPERATIVE INTACT PTH: 27.066 pg/mL (ref 10–65)
INTRAOPERATIVE INTACT PTH: 34.465 pg/mL (ref 10–65)
INTRAOPERATIVE INTACT PTH: 10.986 pg/mL (ref 10–65)

## 2007-01-21 LAB — BLOOD SUGAR FINGERSTICK (POINT OF CARE): FINGERSTICK GLUCOSE: 90 mg/dl (ref 74–160)

## 2007-01-21 LAB — CALCIUM TOTAL: CALCIUM: 8.7 mg/dl (ref 8.6–10.0)

## 2007-01-22 LAB — CALCIUM TOTAL
CALCIUM: 8.4 mg/dl — ABNORMAL LOW (ref 8.6–10.0)
CALCIUM: 8.7 mg/dl (ref 8.6–10.0)

## 2007-01-23 LAB — SURGICAL PATH SPECIMEN

## 2007-01-23 LAB — CALCIUM TOTAL: CALCIUM: 8.8 mg/dl (ref 8.6–10.0)

## 2007-01-24 ENCOUNTER — Encounter (HOSPITAL_BASED_OUTPATIENT_CLINIC_OR_DEPARTMENT_OTHER): Payer: Self-pay | Admitting: Internal Medicine

## 2007-01-24 DIAGNOSIS — E209 Hypoparathyroidism, unspecified: Secondary | ICD-10-CM

## 2007-01-24 NOTE — Progress Notes (Signed)
Note from [facility] hospitalization: Calcium carbonate 500 qid and Rocaltrol 0.20mcg daily, along w/ oxycodone prn pain - s/p parathyroidectomy. f/u in place with Drs. Schwaitzberg and Jeannine Kitten.

## 2007-01-28 LAB — SURGICAL SPECIALTIES LETTER

## 2007-01-28 LAB — OPERATIVE REPORT

## 2007-01-30 ENCOUNTER — Ambulatory Visit (HOSPITAL_BASED_OUTPATIENT_CLINIC_OR_DEPARTMENT_OTHER): Payer: PRIVATE HEALTH INSURANCE | Admitting: Surgery

## 2007-01-31 LAB — LS&HIP BONE DENSITOMETRY SCAN

## 2007-02-01 LAB — CALCIUM TOTAL: CALCIUM: 9.6 mg/dl (ref 8.6–10.0)

## 2007-02-01 LAB — CHG ASSAY OF PHOSPHORUS INORGANIC: PHOSPHORUS: 4.2 mg/dl (ref 2.7–4.7)

## 2007-02-01 LAB — PTH INTACT WITH CALCIUM
CALCIUM: 9.6 mg/dl (ref 8.6–10.0)
PTH INTACT: 28 pg/mL (ref 12–88)

## 2007-02-06 ENCOUNTER — Emergency Department: Payer: Self-pay | Admitting: Emergency Medicine

## 2007-02-08 LAB — EMERGENCY ROOM NOTE

## 2007-02-11 ENCOUNTER — Telehealth (HOSPITAL_BASED_OUTPATIENT_CLINIC_OR_DEPARTMENT_OTHER): Payer: Self-pay | Admitting: Infectious Diseases

## 2007-02-11 NOTE — Telephone Encounter (Signed)
Will be happy to see her Friday or earlier prn. If she has muscular pain related to strain or spasm, then warm compresses may help along with gentle stretching of the muscle a couple of times per day. Icy Hot balm is another potential alternative. (Would not use that under a hot compress though.)

## 2007-02-11 NOTE — Telephone Encounter (Signed)
tc via interpreter    Pt c/o one wk of low back pain. Seen in ED on 2/20 for same.   Taking med w/o effect but does not know the name of it. States told pain is "muscular".     Only wants to see PCP and can only come on day off which is Friday. Does not want to see other provider in evening triage slot as offered. Feels can wait.  Also wants to discuss recent surgery issues.

## 2007-02-11 NOTE — Telephone Encounter (Signed)
Staff Message copied by Bevelyn Buckles on 02/11/2007 at 11:56 AM  ------   Message from: Angus Palms   Created: 02/11/2007 at 11:46 AM   Regarding: TRIAGE SICK CALL/ PAIN   Contact: [phone]    Mariah [name] [id number], 51 year old, female, Telephone Information:  Home Phone [phone]  Work Phone [phone]  Mobile [phone]      [name] NUMBER: [phone]  Cell phone:   Other phone:    Available times:    Patient's language of care: [place]    Patient needs a [place] interpreter.    Patient's PCP: Ebbie Ridge. Lisette Grinder, MD    Person calling on behalf of patient: daughter[GRAZIELE]    Calls today with a sick call.DAUGHTER CALLING STATING THAT HER MOTHER HAS BEEN HAVING A LOT OF LOWER BACK PAIN X 1WEEK.  NEEDS TO SEE THE DOCTOR.    Patient's Preferred Pharmacy:   No Pharmacies Listed

## 2007-02-12 NOTE — Telephone Encounter (Signed)
Called pt . Reminded her she has an appoint with l carlson on Friday . Also gave her lisa's recommendations for pain relief . Will buy icy hot today .

## 2007-02-13 ENCOUNTER — Encounter (HOSPITAL_BASED_OUTPATIENT_CLINIC_OR_DEPARTMENT_OTHER): Payer: PRIVATE HEALTH INSURANCE | Admitting: "Endocrinology

## 2007-02-13 DIAGNOSIS — E213 Hyperparathyroidism, unspecified: Secondary | ICD-10-CM

## 2007-02-13 DIAGNOSIS — M810 Age-related osteoporosis without current pathological fracture: Secondary | ICD-10-CM

## 2007-02-14 LAB — MEDICAL SPECIALTIES LETTER

## 2007-02-15 ENCOUNTER — Ambulatory Visit (HOSPITAL_BASED_OUTPATIENT_CLINIC_OR_DEPARTMENT_OTHER): Payer: PRIVATE HEALTH INSURANCE | Admitting: Internal Medicine

## 2007-02-15 VITALS — BP 132/94 | HR 70 | Temp 98.1°F | Wt 209.0 lb

## 2007-02-15 DIAGNOSIS — M549 Dorsalgia, unspecified: Secondary | ICD-10-CM

## 2007-02-15 DIAGNOSIS — M5430 Sciatica, unspecified side: Secondary | ICD-10-CM

## 2007-02-15 MED ORDER — PAMELOR 10 MG PO CAPS
ORAL_CAPSULE | ORAL | Status: AC
Start: 2007-02-15 — End: 2007-05-16

## 2007-02-15 NOTE — Progress Notes (Signed)
Entire visit/phone conversation conducted with assistance of medical interpreter,Mariah [name].    Issues discussed: Low back pain radiating down left leg    HPI: Patient reports that she was gently leaning over to clean a coffee table about 10 days ago when she experienced sudden onset of left low back pain. It radiates to the left lateral leg down to the knee. Worsened by movement. Burning and severe at first. Now has improved but still 4/10. Used Tylenol and Oxycodone for relief but nothing took the pain away when it was severe. Last use of any oxycodone was 2 days ago. She tries not to use that unless absolutely required. No paresthesias or weakness. no bowel/bladder problems. No fevers.    Pt thinks this has happened intermittently for about 5 years and usually lasts a few days to 2 weeks. It may be the same as what she had in early January though she had not described it quite the same then. She does think she has to urinate more frequently like she did then but does not think they are related now.    Overall she does report an improved sense of well-being since having her parathyroidectomy.    Patient Active Problem List:   TOBACCO USE DISORDER [305.1]   Priority: High [1]   Date Noted: 08/05/2004   Comment: 1/2 PPD since 51 YO - quit 3/07   HYPERPARATHYROIDISM [252.0]   Date Noted: 02/16/2007   Comment: pateint had parathyroid adenoma removed    Februrary 2008 and PTH normalized so cured    in all likelihood.   ABSENCE OF MENSTRUATION - s/p TAH BSO [626.0]   Date Noted: 06/04/2006   TUBERCULIN TEST REACTION NO TBC [795.5]   Date Noted: 06/04/2006   Comment: + PPD - S/P 4 MONTHS OF RIFAMATE IN 2007   HEARTBURN [787.1]   Date Noted: 04/23/2006   Comment: The upper endoscopy 7/07 showed evidence    of a hiatus    hernia, esophagitis, gastritis, and    duodenitis   A biopsy of the antrum to rule out H.    pylori was also negative.      ALLERGIC RHINITIS NEC [477.8]   Date Noted: 04/23/2006   COUGH [786.2]    Date Noted: 04/23/2006   Comment: s/p PFTs September 2007 -- some    obstruction at mid to low lung volumes not    correctable by bronchodilator   CT scan showed stable pulmonary nodules    over several years   11/07 - cough gone         DIARRHEA NOS [787.91]   Date Noted: 03/27/2006   Comment: 7/07 The colonoscopy was visually normal    and biopsies taken to rule out microscopic    colitis were negative. A biopsy of the    descending    duodenum to rule out celiac sprue because    of the diarrhea was negative.    ABN FIND-STOOL CONTENTS-OCC BLOOD [792.1]   Date Noted: 03/27/2006   Comment: s/p colonoscopy and EGD 7/07   nodules on chest CT 1/06 and 3/07 [518.89]   Date Noted: 03/27/2006   ABNORMAL LIVER FUNCTION STUDY [794.8]   Date Noted: 03/13/2006   Comment: LIKELY DUE TO FATTY LIVER - SEE [place] OF    06/04/06. - HEP B AND C   INSOMNIA NOS [780.52]   Date Noted: 03/13/2006   CALCULUS OF KIDNEY [592.0]   Date Noted: 03/13/2006   Comment: related to hyperparathyroidism   DEPRESSIVE DISORDER  NEC [311]   Date Noted: 03/13/2006   BENIGN HYP HRT DIS W/O HRT FAIL [402.10]   Date Noted: 02/17/2006   LATERAL FOOT PAIN [845.10]   Date Noted: 08/30/2005   Comment: Saw Dr. Forrest Moron, no clear dx 7/06   RECURR DEPR PSYCHOS-MILD [296.31]   Date Noted: 08/05/2004   Comment: TSH wnl 8/02; trial of fluoxetine x 6 wks    didn't really help 8/05   Feels return of depression 2/06 to present,    trial of citalopram   OBESITY NOS [278.00]   Date Noted: 08/05/2004   Comment: has tried meds from [place]    Current outpatient prescriptions: IRBESARTAN 150 MG OR TABS, 1 tablet twice a day - THIS WAS APPROVED BY [facility] Disp: 60, Rfl: 5; METAMUCIL 1.7 GM OR WAFR, 1 to 2 wafers daily with 12 ounces of fluids, Disp: 60, Rfl: 5; CALCIUM 500 MG OR TABS, 500mg  every 6 hours, Disp: 0, Rfl: 0; FLONASE 50 MCG/DOSE NA INHA, 1 spray to each nostril daily, Disp: 1, Rfl: 3; CLARITIN 10 MG OR TABS, 1 TABLET BY MOUTH DAILY AS NEEDED;NOT MORE THAN ONE  TABLET DAILY, Disp: 30, Rfl: 5    Review of Patient's Allergies indicates:   Lisinopril Cough   Hydrochlorothiazide    Comment: high calcium     Heparin    Comment: local rash seen during hospitalization Feb 2008      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 also no CVA tenderness.  Hip on left - no tenderness over greater trochanter. no pain or limitation of internal or external rotation  Legs - negative straight leg raise contralateral + ipsilateral but only at about 75 degrees  Neuro - 5/5 strength hip flexors/ extensors, knee flexors/ extensors, foot dorsi/plantar flexors bilaterally   DTRs normal 2+ and symmetric patellar, ankle bilaterally, toes downgoing, no ankle clonus bilaterally    A/P: 51 y/o woman with   724.3 SCIATICA (primary encounter diagnosis)  Note: left side - seems most likely diagnosis given her description of the pain and the exam. rev'd with her the cause by showing diagrams of LS spine on computer and advised weight loss and back/abdominal muscle strengthening may help her prevent it. advised not to lift heavy objects while leaning forward/ to use knees instead. d/w her ok to use meds as she has been. did not recommend NSAIDS due to recent neck surgery at beginning of month. no imaging ordered as pt already spontaneously improving. reviewed warning sxs that would indicate need for urgent re-evaluation such as numbness, weakness in legs.    Plan: PAMELOR 10 MG OR CAPS to start at night due to potential causing of drowsiness- pt can stop if pain gone, cont prn Tylenol and oxycodone for now, REFERRAL TO PHYSICAL THERAPY ([facility])  - re-eval if pain returns/exacerbates.    724.5 BACKACHE NOS  Note: doubt renal etiology now but had h/o stones  Plan:  URINALYSIS   if pt can give sample

## 2007-02-16 DIAGNOSIS — E213 Hyperparathyroidism, unspecified: Secondary | ICD-10-CM | POA: Insufficient documentation

## 2007-02-18 NOTE — Progress Notes (Addendum)
Addended by: Blane Ohara on: 02/18/2007 6:31:20 PM     Modules accepted: Level of Service

## 2007-02-19 ENCOUNTER — Encounter (HOSPITAL_BASED_OUTPATIENT_CLINIC_OR_DEPARTMENT_OTHER): Payer: Self-pay | Admitting: Internal Medicine

## 2007-02-20 ENCOUNTER — Ambulatory Visit (HOSPITAL_BASED_OUTPATIENT_CLINIC_OR_DEPARTMENT_OTHER): Payer: PRIVATE HEALTH INSURANCE | Admitting: Surgery

## 2007-02-20 LAB — SURGICAL SPECIALTIES LETTER

## 2007-04-01 ENCOUNTER — Ambulatory Visit (HOSPITAL_BASED_OUTPATIENT_CLINIC_OR_DEPARTMENT_OTHER): Payer: PRIVATE HEALTH INSURANCE | Admitting: Ophthalmology

## 2007-04-01 DIAGNOSIS — H524 Presbyopia: Secondary | ICD-10-CM

## 2007-04-01 DIAGNOSIS — H04129 Dry eye syndrome of unspecified lacrimal gland: Secondary | ICD-10-CM

## 2007-04-22 ENCOUNTER — Other Ambulatory Visit (HOSPITAL_BASED_OUTPATIENT_CLINIC_OR_DEPARTMENT_OTHER): Payer: Self-pay | Admitting: Ambulatory Care

## 2007-04-22 DIAGNOSIS — I119 Hypertensive heart disease without heart failure: Secondary | ICD-10-CM

## 2007-04-22 NOTE — Telephone Encounter (Addendum)
Left msg for patient on cell phone.

## 2007-04-22 NOTE — Telephone Encounter (Signed)
Staff Message copied by Windell Moulding on Mon Apr 22, 2007 2:53 PM  ------   Message from: Mariah [name]   Created: Mon Apr 22, 2007 2:42 PM   Regarding: Mariah [name]   Contact: [phone]    IKEA DEMICCO [id number], 51 year old, female, Telephone Information:  Home Phone [phone]  Work Phone [phone]  Mobile [phone]      [name] NUMBER: [phone]  Cell phone:   Other phone:    Available times:    Patient's language of care: [place]    Patient needs a [place] interpreter.    Patient's PCP: Ebbie Ridge. Lisette Grinder, MD    Person calling on behalf of patient: patient (self)    Calls today for med refill(s). AVAPRO 150 MG    Patient's Preferred Pharmacy:   [facility]  Phone: [phone] Fax: [phone]

## 2007-04-22 NOTE — Telephone Encounter (Signed)
Verified with pharmacy pt has refills for next 6 mos. They will fill.Please call pt and notify. Thanks-

## 2007-05-01 ENCOUNTER — Ambulatory Visit (HOSPITAL_BASED_OUTPATIENT_CLINIC_OR_DEPARTMENT_OTHER): Payer: Self-pay | Admitting: "Endocrinology

## 2007-05-01 LAB — PTH INTACT WITH CALCIUM
CALCIUM: 9.7 mg/dl (ref 8.6–10.0)
PTH INTACT: 36 pg/mL (ref 12–88)

## 2007-05-06 ENCOUNTER — Ambulatory Visit (HOSPITAL_BASED_OUTPATIENT_CLINIC_OR_DEPARTMENT_OTHER): Payer: PRIVATE HEALTH INSURANCE | Admitting: "Endocrinology

## 2007-05-06 DIAGNOSIS — E213 Hyperparathyroidism, unspecified: Secondary | ICD-10-CM

## 2007-05-06 LAB — MEDICAL SPECIALTIES LETTER

## 2007-05-12 ENCOUNTER — Encounter (HOSPITAL_BASED_OUTPATIENT_CLINIC_OR_DEPARTMENT_OTHER): Payer: Self-pay | Admitting: Internal Medicine

## 2007-05-12 DIAGNOSIS — M810 Age-related osteoporosis without current pathological fracture: Secondary | ICD-10-CM

## 2007-05-12 NOTE — Progress Notes (Signed)
updated med/problem list per Dr. Ronnie Doss note 5/08

## 2007-05-20 ENCOUNTER — Other Ambulatory Visit (HOSPITAL_BASED_OUTPATIENT_CLINIC_OR_DEPARTMENT_OTHER): Payer: Self-pay

## 2007-05-20 DIAGNOSIS — R12 Heartburn: Secondary | ICD-10-CM

## 2007-05-20 MED ORDER — PROTONIX 40 MG PO TBEC
DELAYED_RELEASE_TABLET | ORAL | Status: DC
Start: 2007-05-20 — End: 2007-11-13

## 2007-05-20 NOTE — Telephone Encounter (Signed)
Will ask front end to notify that script sent to [facility].

## 2007-05-20 NOTE — Telephone Encounter (Signed)
Staff Message copied by Sharlyne Pacas on Mon May 20, 2007 2:40 PM  ------   Message from: Mariah [name]   Created: Mon May 20, 2007 2:29 PM   Contact: [phone]    Mariah [name] [id number], 51 year old, female, Telephone Information:  Home Phone [phone]  Work Phone [phone]  Mobile [phone]      [name] NUMBER: [phone]  Cell phone:   Other phone:    Available times:    Patient's language of care: [place]    Patient needs a [place] interpreter.    Patient's PCP: Ebbie Ridge. Lisette Grinder, MD    Person calling on behalf of patient: patient (self)    Calls today for med refill(s). PROTONIX 40 MG     Patient's Preferred Pharmacy:   [facility] ([facility]  Phone: [phone] Fax: [phone]

## 2007-05-21 NOTE — Telephone Encounter (Signed)
Patient has been notify

## 2007-06-24 ENCOUNTER — Encounter (HOSPITAL_BASED_OUTPATIENT_CLINIC_OR_DEPARTMENT_OTHER): Payer: Self-pay | Admitting: Internal Medicine

## 2007-06-24 ENCOUNTER — Ambulatory Visit (HOSPITAL_BASED_OUTPATIENT_CLINIC_OR_DEPARTMENT_OTHER): Payer: PRIVATE HEALTH INSURANCE | Admitting: Internal Medicine

## 2007-06-24 VITALS — BP 130/82 | HR 80 | Temp 98.5°F | Wt 208.0 lb

## 2007-06-24 DIAGNOSIS — Z131 Encounter for screening for diabetes mellitus: Secondary | ICD-10-CM

## 2007-06-24 DIAGNOSIS — E669 Obesity, unspecified: Secondary | ICD-10-CM

## 2007-06-24 DIAGNOSIS — I119 Hypertensive heart disease without heart failure: Secondary | ICD-10-CM

## 2007-06-24 DIAGNOSIS — R011 Cardiac murmur, unspecified: Secondary | ICD-10-CM | POA: Insufficient documentation

## 2007-06-24 NOTE — Progress Notes (Signed)
Entire visit/phone conversation conducted with assistance of medical interpreter,Anita.      Reason for visit:  #HTN - recheck - takign irbesartan - no problems     # GERD - notes that if she misses a single day of the protonix the sxs return. No blood in the stool. Did have EGD showing gastritis.  no early satiety, n/v. and no weight loss.       Other:  No chest pressure or chest pain.   Due for mammogram - pt has no concerns  Sciatica pain resolved    Patient Active Problem List   Osteoporosis [733.00C]   Date Noted: 05/12/2007   2007 BMD: FINDINGS: The bone mineral density of the    lumbar spine measures 0.753    g/cm2, -2.7 SD below peak bone mass (T-score) and    -2.0 SD below age-matched    controls (Z-score). Bone mineral density of the    left forearm (1/3 radius)    measures 0.597 g/cm2, T-score -1.6, at the femoral    neck measures 0.735    g/cm2, T-score -1.0, and at the total left hip    measures 0.870 g/cm2, T-score   -0.6.   IMPRESSION: Osteoporosis is present   HYPERPARATHYROIDISM [252.0]   Date Noted: 02/16/2007   pateint had parathyroid adenoma removed Februrary    2008 and PTH normalized so cured in all likelihood.   ABSENCE OF MENSTRUATION - s/p TAH BSO [626.0]   Date Noted: 06/04/2006   TUBERCULIN TEST REACTION NO TBC [795.5]   Date Noted: 06/04/2006   + PPD - S/P 4 MONTHS OF RIFAMATE IN 2007   HEARTBURN [787.1]   Date Noted: 04/23/2006   The upper endoscopy 7/07 showed evidence of a    hiatus    hernia, esophagitis, gastritis, and duodenitis   A biopsy of the antrum to rule out H. pylori was    also negative.      ALLERGIC RHINITIS NEC [477.8]   Date Noted: 04/23/2006   COUGH [786.2]   Date Noted: 04/23/2006   s/p PFTs September 2007 -- some obstruction at mid    to low lung volumes not correctable by bronchodilator   CT scan showed stable pulmonary nodules over several    years   11/07 - cough gone         DIARRHEA NOS [787.91]   Date Noted: 03/27/2006   7/07 The colonoscopy was visually normal  and    biopsies taken to rule out microscopic    colitis were negative. A biopsy of the descending    duodenum to rule out celiac sprue because of the    diarrhea was negative.    ABN FIND-STOOL CONTENTS-OCC BLOOD [792.1]   Date Noted: 03/27/2006   s/p colonoscopy and EGD 7/07   nodules on chest CT 1/06 and 3/07 [518.89]   Date Noted: 03/27/2006   ABNORMAL LIVER FUNCTION STUDY [794.8]   Date Noted: 03/13/2006   LIKELY DUE TO FATTY LIVER - SEE [place] OF 06/04/06. -    HEP B AND C   INSOMNIA NOS [780.52]   Date Noted: 03/13/2006   CALCULUS OF KIDNEY [592.0]   Date Noted: 03/13/2006   related to hyperparathyroidism   DEPRESSIVE DISORDER NEC [311]   Date Noted: 03/13/2006   BENIGN HYP HRT DIS W/O HRT FAIL [402.10]   Date Noted: 02/17/2006   LATERAL FOOT PAIN [845.10]   Date Noted: 08/30/2005   Saw Dr. Forrest Moron, no clear dx 7/06   RECURR DEPR  PSYCHOS-MILD [296.31]   Date Noted: 08/05/2004   TSH wnl 8/02; trial of fluoxetine x 6 wks didn't    really help 8/05   trial of citalopram   TOBACCO USE DISORDER [305.1]   Date Noted: 08/05/2004   1/2 PPD since 51 YO - quit 3/07   OBESITY NOS [278.00]   Date Noted: 08/05/2004   has tried meds from [place]     Current outpatient prescriptions : PROTONIX 40 MG OR TBEC, 1 tablet by mouth daily, Disp: 30, Rfl: 5; VITAMIN D 400 UNIT OR CAPS, 2 capsules daily, Disp: 30, Rfl: 0; IRBESARTAN 150 MG OR TABS, 1 tablet twice a day - THIS WAS APPROVED BY [facility] Disp: 60, Rfl: 5; CLARITIN 10 MG OR TABS, 1 TABLET BY MOUTH DAILY AS NEEDED;NOT MORE THAN ONE TABLET DAILY, Disp: 30, Rfl: 5; CALCIUM 500 MG OR TABS, 500mg  every 6 hours, Disp: 0, Rfl: 0     All current meds were reviewed at time of pt encounter in detail with patient and record updated as in med list and noted here.     Review of Patient's Allergies indicates:   Lisinopril Cough   Hydrochlorothiazide    Comment: high calcium     Heparin    Comment: local rash seen during hospitalization Feb 2008     Social History Narrative   work:  Land; owned bakery in [place]   Lives w/ daughter 24 YO (Grazielle Lusk) & son-in-law Kern Alberta)   Divorced; currently not in a relationship       PE:BP 130/82   Pulse 80   Temp 98.5 F (36.9 C)   Wt 208 lb (94.348 kg)   LMP Postmenopausal   Gen - overall healthy appearing except for weight, no apparent distress, alert and oriented   Psych - pt became tearful during visit but denied depression - reported that sometimes people do thing sthat make her sad and she becomes tearful. she did not want to discuss thing smore.  Neck - no carotid bruits/ nl carotids  CV- RRR 1-2/6 soft sytolic murmur heard in LUSB adn RUSB, no RG, nlS1S2   Pulm - CTAb  Breast - breast symmetric, normal breast tissue, no axillary lymphadenopathy, reviewed self-exam with patient   Exts - no c,c,e     A/P: 51 y/o woman with   402.10 BENIGN HYP HRT DIS W/O HRT FAIL (primary encounter diagnosis)  Comment: overall doing well on current med  Plan: BASIC METABOLIC PANEL FASTING   when pt able to return fasting    recheck BP in December in lcinic    278.00J Obesity  Comment: patient needs repeat fasting lipids as last ones done in 2006  Plan: LIPID PANEL       V77.1 Screening for Diabetes Mellitus  Comment:   Plan: BASIC METABOLIC PANEL FASTING   when able to return     785.2G Murmur Heart  Comment: likely benign flow murmur - nl S1S2 - follow clinically as no sxs  Plan: monitor clinically for now as no complaints and murmur very soft in character and not obscurring S1S2    mammogram request given to pt to do walk in mammogram at [facility]     also gave pt # for Dr. Zachery Dakins office to recheck with her re: prior PFTs and CT scan. pt will call Dr. Dorette Grate.

## 2007-08-20 ENCOUNTER — Telehealth (HOSPITAL_BASED_OUTPATIENT_CLINIC_OR_DEPARTMENT_OTHER): Payer: Self-pay | Admitting: Ambulatory Care

## 2007-08-20 NOTE — Telephone Encounter (Signed)
Via port interpreter .   Tc lm to call [phone] .

## 2007-08-20 NOTE — Telephone Encounter (Signed)
Staff Message copied by Audrie Gallus on Tue Aug 20, 2007 4:40 PM  ------   Message from: Joretta Bachelor   Created: Tue Aug 20, 2007 4:08 PM   Regarding: Tamsen Roers PAIN   Contact: [phone]    SHAWONDA KERCE is a 51 year old female  Patient's PCP: Ebbie Ridge. Lisette Grinder, MD    Telephone Information:  Home Phone [phone]  Work Phone [phone]  Mobile [phone]      Patient's language of care: Portuguese  Patient needs a [place] interpreter.  Person calling on behalf of patient: daughter    Calls today with a   PT WAS GETTING SOMETHING YESTERDAY AND PULLED HER BACK.  CALL BACK NUMBER:   Will patient remain at this number for the next 60 minutes: Yes  If no, please leave a message, or if you need to speak to a nurse, please call back when you can remain at your phone for 60 minutes.      Is this the patient's Preferred Pharmacy: Yes    [facility] ([facility])  Phone: [phone] Fax: [phone]

## 2007-09-04 NOTE — Telephone Encounter (Signed)
Via port interpreter .   Tc lm to call [phone] and speak to nurses .

## 2007-09-06 NOTE — Telephone Encounter (Signed)
Several messages left for pt but never returned our call.FYI to pcp.

## 2007-11-04 ENCOUNTER — Ambulatory Visit (HOSPITAL_BASED_OUTPATIENT_CLINIC_OR_DEPARTMENT_OTHER): Payer: PRIVATE HEALTH INSURANCE | Admitting: "Endocrinology

## 2007-11-04 DIAGNOSIS — I10 Essential (primary) hypertension: Secondary | ICD-10-CM

## 2007-11-04 DIAGNOSIS — M810 Age-related osteoporosis without current pathological fracture: Secondary | ICD-10-CM

## 2007-11-04 LAB — MEDICAL SPECIALTIES LETTER

## 2007-11-13 ENCOUNTER — Other Ambulatory Visit (HOSPITAL_BASED_OUTPATIENT_CLINIC_OR_DEPARTMENT_OTHER): Payer: Self-pay | Admitting: Ambulatory Care

## 2007-11-13 DIAGNOSIS — E209 Hypoparathyroidism, unspecified: Secondary | ICD-10-CM

## 2007-11-13 DIAGNOSIS — R12 Heartburn: Secondary | ICD-10-CM

## 2007-11-13 DIAGNOSIS — I119 Hypertensive heart disease without heart failure: Secondary | ICD-10-CM

## 2007-11-13 MED ORDER — CALCIUM 500 MG PO TABS
ORAL_TABLET | ORAL | Status: AC
Start: 2007-11-13 — End: 2008-11-12

## 2007-11-13 MED ORDER — PROTONIX 40 MG PO TBEC
DELAYED_RELEASE_TABLET | ORAL | Status: DC
Start: 2007-11-13 — End: 2008-08-03

## 2007-11-13 MED ORDER — IRBESARTAN 150 MG PO TABS
ORAL_TABLET | ORAL | Status: DC
Start: 2007-11-13 — End: 2008-12-02

## 2007-11-13 NOTE — Telephone Encounter (Signed)
Mariah [name] is a 51 year old female calling to request a refill.    HTN Med:    Most Recent BP Reading(s)     Date:        BP:     06/24/2007   130/82     02/15/2007   132/94     12/25/2006   150/100  Statin:  Lipids   Cholesterol (mg/dl)   Date  Value    1/61/09  176    ----------    LDL (mg/dl)   Date  Value    05/21/53  115*   ----------    HDL (mg/dl)   Date  Value    0/98/11  49    ----------    TRIGLYCERIDES (mg/dl)   Date  Value    08/31/77  70    ----------  LFTs     ALANINE AMINOTRANSFERASE (IU/L)   Date  Value    10/22/06  46*   ----------      ASPARTATE AMINOTRANSFERAS (IU/L)   Date  Value    10/22/06  40*   ----------      ALBUMIN (g/dl)   Date  Value    29/5/62  4.5    ----------      TOTAL PROTEIN (g/dl)   Date  Value    13/0/86  7.6*   ----------      BILIRUBIN DIRECT (mg/dl)   Date  Value    5/78/46  0.1    ----------      BILIRUBIN TOTAL (mg/dl)   Date  Value    96/2/95  0.6    ----------      ALKALINE PHOSPHATASE (IU/L)   Date  Value    10/22/06  155*   ----------        Documented patient preferred pharmacies:  [facility] ([facility])  Phone: [phone] Fax: [phone]

## 2007-11-13 NOTE — Telephone Encounter (Signed)
Staff Message copied by Windell Moulding on Wed Nov 13, 2007 3:04 PM  ------   Message from: Tanna Savoy   Created: Wed Nov 13, 2007 3:00 PM   Regarding: [name]    Mariah [name] [id number], 51 year old, female, Telephone Information:  Home Phone [phone]  Work Phone [phone]  Mobile [phone]      [name] NUMBER: [phone]  Cell phone:   Other phone:    Available times:    Patient's language of care: [place]    Patient needs a [place] interpreter.    Patient's PCP: Ebbie Ridge. Lisette Grinder, MD    Person calling on behalf of patient: patient (self)    Calls today for med refill(s).SHE NEEDS MEDECATION PROTONIX 40 MG CALCIUM 500MG  AVAPRO 150 MG    Patient's Preferred Pharmacy:   [facility] ([facility])  Phone: [phone] Fax: [phone]

## 2007-11-13 NOTE — Telephone Encounter (Signed)
Refill authorized at [facility] pharmacy.  Please let her know and close encounter.

## 2007-12-24 ENCOUNTER — Encounter (HOSPITAL_BASED_OUTPATIENT_CLINIC_OR_DEPARTMENT_OTHER): Payer: Self-pay | Admitting: Internal Medicine

## 2007-12-24 ENCOUNTER — Ambulatory Visit (HOSPITAL_BASED_OUTPATIENT_CLINIC_OR_DEPARTMENT_OTHER): Payer: PRIVATE HEALTH INSURANCE | Admitting: Internal Medicine

## 2007-12-24 VITALS — BP 160/80 | HR 77 | Temp 98.5°F | Wt 213.0 lb

## 2007-12-24 DIAGNOSIS — R059 Cough, unspecified: Secondary | ICD-10-CM

## 2007-12-24 DIAGNOSIS — R070 Pain in throat: Secondary | ICD-10-CM

## 2007-12-24 DIAGNOSIS — I119 Hypertensive heart disease without heart failure: Secondary | ICD-10-CM

## 2007-12-24 DIAGNOSIS — R05 Cough: Principal | ICD-10-CM

## 2007-12-24 DIAGNOSIS — J3089 Other allergic rhinitis: Secondary | ICD-10-CM

## 2007-12-24 DIAGNOSIS — Z800 Family history of malignant neoplasm of digestive organs: Secondary | ICD-10-CM

## 2007-12-24 MED ORDER — CLARITIN 10 MG PO TABS
ORAL_TABLET | ORAL | Status: AC
Start: 2007-12-24 — End: 2008-06-22

## 2007-12-24 MED ORDER — HYDROCHLOROTHIAZIDE 25 MG PO TABS
ORAL_TABLET | ORAL | Status: DC
Start: 2007-12-24 — End: 2008-08-03

## 2007-12-24 MED ORDER — PROAIR HFA 108 (90 BASE) MCG/ACT IN AERS
INHALATION_SPRAY | RESPIRATORY_TRACT | Status: AC
Start: 2007-12-24 — End: 2008-06-22

## 2007-12-24 MED ORDER — FLUTICASONE PROPIONATE HFA 220 MCG/ACT IN AERO
INHALATION_SPRAY | RESPIRATORY_TRACT | Status: AC
Start: 2007-12-24 — End: 2008-06-22

## 2007-12-24 MED ORDER — FLONASE 50 MCG/DOSE NA INHA
NASAL | Status: AC
Start: 2007-12-24 — End: 2008-06-22

## 2007-12-24 NOTE — Progress Notes (Signed)
Entire visit/phone conversation conducted with assistance of medical interpreter,[employer] on the phone.     Issues reviewed:    # Constant pain right throat  - for one month  - worse at night  - patient with fam h/o throat cancer and h/o tobacco use  - does not think it is burning pain  - taking pantoprazole QHS  - sometimes very painful with swallowing even saliva - can wake her up at night  - denies that she is exposed to a lot of hot air from heater at home  - daughter, Albin Felling, is present today and she thinks it is related to her coughing    # Cough  - for 2 months now   - dry cough only  - feels winded when goes up stairs  - no chest pains  - no f,c,ns    # BP   - taking med BID as prescribed  - at home has her own BP cufff and it routinely is 140's/90 to 100  - today she knows she is anxious in clinic    Patient Active Problem List    Murmur Heart [785.2G]         Date Noted: 06/24/2007         likely benign flow murmur - nl S1S2 - follow          clinically as no sxs    Osteoporosis [733.00C]         Date Noted: 05/12/2007         2007 BMD: FINDINGS:  The bone mineral density of the          lumbar spine measures 0.753          g/cm2, -2.7 SD below peak bone mass (T-score) and          -2.0 SD below age-matched          controls (Z-score).  Bone mineral density of the          left forearm (1/3 radius)          measures 0.597 g/cm2, T-score -1.6, at the femoral          neck measures 0.735          g/cm2, T-score -1.0, and at the total left hip          measures 0.870 g/cm2, T-score         -0.6.         IMPRESSION:  Osteoporosis is present    HYPERPARATHYROIDISM [252.0]         Date Noted: 02/16/2007         pateint had parathyroid adenoma removed Februrary          2008 and PTH normalized so cured in all likelihood.    ABSENCE OF MENSTRUATION - s/p TAH BSO [626.0]         Date Noted: 06/04/2006    TUBERCULIN TEST REACTION NO TBC [795.5]         Date Noted: 06/04/2006         + PPD - S/P 4 MONTHS  OF RIFAMATE IN 2007    HEARTBURN [787.1]         Date Noted: 04/23/2006          The upper endoscopy 7/07 showed evidence of a          hiatus          hernia, esophagitis, gastritis, and duodenitis  A biopsy of the antrum to rule out H. pylori was          also negative.             ALLERGIC RHINITIS NEC [477.8]         Date Noted: 04/23/2006    COUGH [786.2]         Date Noted: 04/23/2006         s/p PFTs September 2007 --  some obstruction at mid          to low lung volumes not correctable by bronchodilator         CT scan showed stable pulmonary nodules over several          years         11/07 - cough gone                      DIARRHEA NOS [787.91]         Date Noted: 03/27/2006         7/07 The colonoscopy was visually normal and          biopsies taken to rule out microscopic          colitis were negative.  A biopsy of the descending          duodenum to rule out celiac sprue because of the          diarrhea was negative.     ABN FIND-STOOL CONTENTS-OCC BLOOD [792.1]         Date Noted: 03/27/2006         s/p colonoscopy and EGD 7/07    nodules on chest CT 1/06 and 3/07 [518.89]         Date Noted: 03/27/2006    ABNORMAL LIVER FUNCTION STUDY [794.8]         Date Noted: 03/13/2006         LIKELY DUE TO FATTY LIVER - SEE [place] OF 06/04/06.  -          HEP B AND C    INSOMNIA NOS [780.52]         Date Noted: 03/13/2006    CALCULUS OF KIDNEY [592.0]         Date Noted: 03/13/2006         related to hyperparathyroidism      BENIGN HYP HRT DIS W/O HRT FAIL [402.10]         Date Noted: 02/17/2006    LATERAL FOOT PAIN [845.10]         Date Noted: 08/30/2005         Saw Dr. Forrest Moron, no clear dx 7/06    RECURR DEPR PSYCHOS-MILD [296.31]         Date Noted: 08/05/2004         TSH wnl 8/02; trial of fluoxetine x 6 wks didn't          really help 8/05          trial of citalopram    TOBACCO USE DISORDER [305.1]         Date Noted: 08/05/2004          1/2 PPD since 52 YO - quit 3/07    OBESITY NOS [278.00]          Date Noted: 08/05/2004         has tried meds from [place]    Meds: Protonix daily  Irbesartan 150mg  BID  Calcium TID  Vitamin D    All current meds were reviewed at time of pt encounter in detail with patient and record updated as in med list and noted here.     Review of Patient's Allergies indicates:   Lisinopril              Cough   Heparin                     Comment: local rash seen during hospitalization Feb 2008     FH:  father with throat cancer per patient    PE:BP 160/80   Pulse 77   Temp (Src) 98.5 F (36.9 C) (Oral)   Wt 213 lb (96.616 kg)   SpO2 99%   LMP Postmenopausal   BP recheck and 140/102 range  Gen- overweight, no apparent distress, alert and oriented   occasional coughing during interview  Mouth - noted broken tooth right mandible/molar - pt denies this is origin of the pain. otherwise tongue normal and no visible abnormality.   Ears - slightly red TM right - pt has no pain in ears.  Neck - non-tender. no lymphadenopathy. trachea midline.  thyroid normal.  CV - RRR no MRG nlS1S2  Pulm - bilaterally with wheeze on cough, no crackles, good air movement thorughout.  coughing with deep breathing.   Exts - no edema in lower exts    A/P:  52 y/o woman here for  786.2 Cough  (primary encounter diagnosis)  Comment: seems possibly related to allergies/ asthma given the time of 2 months of coughing and her exam today.  will also consider neoplasm/ infectious cause (given prior granulomas)  Plan: CLARITIN 10 MG OR TABS, PROAIR HFA 108 (90         BASE) MCG/ACT IN AERS, REFERRAL TO         CARDIO-PULMONARY LAB ([facility]) for PFTs, FLUTICASONE         PROPIONATE  HFA 220 MCG/ACT IN AERO        CXR requested now.    784.1 Throat Pain  Comment: unclear etiology.  pt noted during interview actually that it was worse with coughing.  so, perhpas coughing or post-nasal drip is cause.    Plan: REFERRAL TO ENT ([facility])        for likely endoscopic viewing of throat    V16.0AR Family History of Throat Cancer  Comment: as  above to ENT for review  Plan: REFERRAL TO ENT ([facility])            477.8 ALLERGIC RHINITIS NEC  Comment:   Plan: CLARITIN 10 MG OR TABS, FLONASE 50 MCG/DOSE NA         INHA            402.10 BENIGN HYP HRT DIS W/O HRT FAIL  Comment: needs additional bp control  Plan: HYDROCHLOROTHIAZIDE 25 MG OR TABS             mammogram need to order at next visit in 2 weeks.  rtc in 2 weeks for BP recheck.  to come for fasting labs the day before returning.

## 2008-01-07 ENCOUNTER — Ambulatory Visit (HOSPITAL_BASED_OUTPATIENT_CLINIC_OR_DEPARTMENT_OTHER): Payer: PRIVATE HEALTH INSURANCE | Admitting: Internal Medicine

## 2008-01-07 ENCOUNTER — Ambulatory Visit: Payer: Self-pay | Admitting: Internal Medicine

## 2008-01-07 VITALS — BP 130/84 | HR 90 | Temp 98.1°F | Ht 63.5 in | Wt 214.0 lb

## 2008-01-07 DIAGNOSIS — E669 Obesity, unspecified: Secondary | ICD-10-CM

## 2008-01-07 DIAGNOSIS — R945 Abnormal results of liver function studies: Secondary | ICD-10-CM

## 2008-01-07 DIAGNOSIS — I119 Hypertensive heart disease without heart failure: Secondary | ICD-10-CM

## 2008-01-07 DIAGNOSIS — M810 Age-related osteoporosis without current pathological fracture: Secondary | ICD-10-CM

## 2008-01-07 LAB — BASIC METABOLIC PANEL
ANION GAP: 9 mmol/L (ref 2–25)
BUN (UREA NITROGEN): 20 mg/dl (ref 6–20)
CALCIUM: 9.4 mg/dl (ref 8.6–10.0)
CARBON DIOXIDE: 26 mmol/L (ref 22–32)
CHLORIDE: 101 mmol/L (ref 101–111)
CREATININE: 0.7 mg/dl (ref 0.4–1.2)
ESTIMATED GLOMERULAR FILT RATE: >60 mL/min (ref 60–116)
Glucose Random: 101 mg/dl (ref 74–160)
POTASSIUM: 4.3 mmol/L (ref 3.5–5.1)
SODIUM: 136 mmol/L (ref 135–144)

## 2008-01-07 LAB — XR CHEST 2 VIEWS

## 2008-01-08 ENCOUNTER — Ambulatory Visit: Payer: Self-pay | Admitting: Internal Medicine

## 2008-01-08 ENCOUNTER — Ambulatory Visit (HOSPITAL_BASED_OUTPATIENT_CLINIC_OR_DEPARTMENT_OTHER): Payer: Self-pay | Admitting: Rheumatology

## 2008-01-08 NOTE — Progress Notes (Addendum)
mammogram ordered today.  °

## 2008-01-08 NOTE — Progress Notes (Signed)
Friend served as interpreter per pt wish.      Issues discussed:   # BP - lower now - no problems taking both meds - no side effects  #  Cough - gone now with the inhalers/ meds - feels much better. has upcoming PFTs to check for asthma. rev'd the normal CXR today.  # Throat pain - thinks this has also resolved completely since using meds for cough.  rev'd that she can cancel the appt with Dr. Leander Rams if she thinks she is completely better for a week/ consistently.   # Mammogram overdue - pt is aware - she checks her breasts regularly - has no lumps about which she is concerned but knows she needs the mammogram.     Patient Active Problem List    Family History of Throat Cancer [V16.0AR]         Date Noted: 12/24/2007    Murmur Heart [785.2G]         Date Noted: 06/24/2007         likely benign flow murmur - nl S1S2 - follow          clinically as no sxs    Osteoporosis [733.00C]         Date Noted: 05/12/2007         2007 BMD: FINDINGS:  The bone mineral density of the          lumbar spine measures 0.753          g/cm2, -2.7 SD below peak bone mass (T-score) and          -2.0 SD below age-matched          controls (Z-score).  Bone mineral density of the          left forearm (1/3 radius)          measures 0.597 g/cm2, T-score -1.6, at the femoral          neck measures 0.735          g/cm2, T-score -1.0, and at the total left hip          measures 0.870 g/cm2, T-score         -0.6.         IMPRESSION:  Osteoporosis is present    HYPERPARATHYROIDISM [252.0]         Date Noted: 02/16/2007         pateint had parathyroid adenoma removed Februrary          2008 and PTH normalized so cured in all likelihood.    ABSENCE OF MENSTRUATION - s/p TAH BSO [626.0]         Date Noted: 06/04/2006    TUBERCULIN TEST REACTION NO TBC [795.5]         Date Noted: 06/04/2006         + PPD - S/P 4 MONTHS OF RIFAMATE IN 2007    HEARTBURN [787.1]         Date Noted: 04/23/2006          The upper endoscopy 7/07 showed evidence of a           hiatus          hernia, esophagitis, gastritis, and duodenitis          A biopsy of the antrum to rule out H. pylori was          also negative.  ALLERGIC RHINITIS NEC [477.8]         Date Noted: 04/23/2006    COUGH [786.2]         Date Noted: 04/23/2006         s/p PFTs September 2007 --  some obstruction at mid          to low lung volumes not correctable by bronchodilator         CT scan showed stable pulmonary nodules over several          years         11/07 - cough gone                      DIARRHEA NOS [787.91]         Date Noted: 03/27/2006         7/07 The colonoscopy was visually normal and          biopsies taken to rule out microscopic          colitis were negative.  A biopsy of the descending          duodenum to rule out celiac sprue because of the          diarrhea was negative.     ABN FIND-STOOL CONTENTS-OCC BLOOD [792.1]         Date Noted: 03/27/2006         s/p colonoscopy and EGD 7/07    nodules on chest CT 1/06 and 3/07 [518.89]         Date Noted: 03/27/2006    ABNORMAL LIVER FUNCTION STUDY [794.8]         Date Noted: 03/13/2006         LIKELY DUE TO FATTY LIVER - SEE [place] OF 06/04/06.  -          HEP B AND C    INSOMNIA NOS [780.52]         Date Noted: 03/13/2006    CALCULUS OF KIDNEY [592.0]         Date Noted: 03/13/2006         related to hyperparathyroidism    BENIGN HYP HRT DIS W/O HRT FAIL [402.10]         Date Noted: 02/17/2006    LATERAL FOOT PAIN [845.10]         Date Noted: 08/30/2005         Saw Dr. Forrest Moron, no clear dx 7/06    depressive disorder [296.31]         Date Noted: 08/05/2004         TSH wnl 8/02; trial of fluoxetine x 6 wks didn't          really help 8/05          trial of citalopram per Dr. Noel Gerold. later resolved.    TOBACCO USE DISORDER [305.1]         Date Noted: 08/05/2004          1/2 PPD since 52 YO - quit 3/07    OBESITY NOS [278.00]         Date Noted: 08/05/2004         has tried meds from [place]       Current outpatient prescriptions prior to  encounter:  CLARITIN 10 MG OR TABS 1 TABLET BY MOUTH DAILY. Disp: 30 Rfl: 5   FLONASE 50 MCG/DOSE NA INHA 2 sprays to each nostril daily Disp: 1 Rfl: 5  PROAIR HFA 108 (90 BASE) MCG/ACT IN AERS 1 to 2 puffs every 4 to 6 hours as needed for cough. Disp: 1 Rfl: 5   FLUTICASONE PROPIONATE  HFA 220 MCG/ACT IN AERO 1 puff every 12 hours Disp: 1 Rfl: 5   HYDROCHLOROTHIAZIDE 25 MG OR TABS 1 TABLET DAILY Disp: 30 Rfl: 11   PROTONIX 40 MG OR TBEC 1 tablet by mouth daily Disp: 30 Rfl: 5   CALCIUM 500 MG OR TABS 500mg  every 6 hours Disp: 120 Rfl: 5   IRBESARTAN 150 MG OR TABS 1 tablet twice a day -  THIS WAS APPROVED BY [facility]. Disp: 60 Rfl: 5   VITAMIN D 400 UNIT OR CAPS 2 capsules daily Disp: 30 Rfl: 0          All current meds were reviewed at time of pt encounter in detail with patient and record updated as in med list and noted here.     Review of Patient's Allergies indicates:   Lisinopril              Cough   Heparin                     Comment: local rash seen during hospitalization Feb 2008       SH: no tobacco    PE:BP 130/84   Pulse 90   Temp (Src) 98.1 F (36.7 C) (Oral)   Ht 5' 3.5" (1.613 m)   Wt 214 lb (97.07 kg)   LMP Postmenopausal   Gen - overall healthy appearing except for weight, no apparent distress, alert and oriented   Breast - breast symmetric, normal breast tissue, no axillary lymphadenopathy, reviewed self-exam with patient     A/P:  52 y/o woman with   402.10 BENIGN HYP HRT DIS W/O HRT FAIL  (primary encounter diagnosis)  Comment: doing better with combination of HCTZ and ARB.  will check BMP today.  Plan: BASIC METABOLIC PANEL, ROUTINE VENIPUNCTURE,         GLUCOSE FASTING, LIPID PANEL        future orders for fasting    rtc in 6 months or prn earlier    733.00C Osteoporosis  Comment: needs recheck vit D with next labs.  BMD testing tomorrow.  Plan: VITAMIN D,25 HYDROXY            794.8 ABNORMAL LIVER FUNCTION STUDY  Comment: needs recheck  Plan: HEPATIC FUNCTION PANEL            278.00  OBESITY NOS  Comment:   Plan: GLUCOSE FASTING, LIPID PANEL        future orders    RTC in 6 months or prn earlier.

## 2008-01-09 LAB — MA SCREENING MAMMO BILATERAL WITH CAD

## 2008-01-13 ENCOUNTER — Ambulatory Visit (HOSPITAL_BASED_OUTPATIENT_CLINIC_OR_DEPARTMENT_OTHER): Payer: Self-pay | Admitting: "Endocrinology

## 2008-01-14 LAB — LS&HIP BONE DENSITOMETRY SCAN

## 2008-01-16 ENCOUNTER — Ambulatory Visit (HOSPITAL_BASED_OUTPATIENT_CLINIC_OR_DEPARTMENT_OTHER): Payer: PRIVATE HEALTH INSURANCE | Admitting: Ent-Otolaryngology

## 2008-02-10 ENCOUNTER — Ambulatory Visit (HOSPITAL_BASED_OUTPATIENT_CLINIC_OR_DEPARTMENT_OTHER): Payer: Self-pay | Admitting: Internal Medicine

## 2008-03-04 ENCOUNTER — Ambulatory Visit (HOSPITAL_BASED_OUTPATIENT_CLINIC_OR_DEPARTMENT_OTHER): Payer: PRIVATE HEALTH INSURANCE | Admitting: "Endocrinology

## 2008-03-04 ENCOUNTER — Ambulatory Visit (HOSPITAL_BASED_OUTPATIENT_CLINIC_OR_DEPARTMENT_OTHER): Payer: Self-pay | Admitting: Internal Medicine

## 2008-03-04 LAB — MEDICAL SPECIALTIES LETTER

## 2008-03-06 LAB — VITAMIN D,25 HYDROXY: VITAMIN D,25 HYDROXY: 16.7 ng/mL — AB (ref 32.0–100.0)

## 2008-03-10 ENCOUNTER — Other Ambulatory Visit (HOSPITAL_BASED_OUTPATIENT_CLINIC_OR_DEPARTMENT_OTHER): Payer: Self-pay | Admitting: Internal Medicine

## 2008-03-10 DIAGNOSIS — M810 Age-related osteoporosis without current pathological fracture: Secondary | ICD-10-CM

## 2008-03-11 NOTE — Progress Notes (Signed)
order updated per specialty note.

## 2008-05-18 ENCOUNTER — Ambulatory Visit (HOSPITAL_BASED_OUTPATIENT_CLINIC_OR_DEPARTMENT_OTHER): Payer: PRIVATE HEALTH INSURANCE | Admitting: "Endocrinology

## 2008-05-18 DIAGNOSIS — E559 Vitamin D deficiency, unspecified: Secondary | ICD-10-CM

## 2008-05-18 DIAGNOSIS — M810 Age-related osteoporosis without current pathological fracture: Secondary | ICD-10-CM

## 2008-05-19 ENCOUNTER — Encounter (HOSPITAL_BASED_OUTPATIENT_CLINIC_OR_DEPARTMENT_OTHER): Payer: Self-pay | Admitting: Nurse Practitioner

## 2008-05-19 DIAGNOSIS — E559 Vitamin D deficiency, unspecified: Secondary | ICD-10-CM

## 2008-05-19 HISTORY — DX: Vitamin D deficiency, unspecified: E55.9

## 2008-05-19 LAB — MEDICAL SPECIALTIES LETTER

## 2008-08-03 ENCOUNTER — Other Ambulatory Visit (HOSPITAL_BASED_OUTPATIENT_CLINIC_OR_DEPARTMENT_OTHER): Payer: Self-pay

## 2008-08-03 DIAGNOSIS — I119 Hypertensive heart disease without heart failure: Secondary | ICD-10-CM

## 2008-08-03 DIAGNOSIS — R12 Heartburn: Secondary | ICD-10-CM

## 2008-08-03 MED ORDER — HYDROCHLOROTHIAZIDE 25 MG PO TABS
ORAL_TABLET | ORAL | Status: AC
Start: 2008-08-03 — End: 2009-08-03

## 2008-08-03 MED ORDER — PROTONIX 40 MG PO TBEC
DELAYED_RELEASE_TABLET | ORAL | Status: DC
Start: 2008-08-03 — End: 2008-12-02

## 2008-08-03 NOTE — Telephone Encounter (Signed)
Mariah [name] is a 52 year old female calling to request a refill of  Protonix and hctz,last bmp 1/09 all values wnl, forward to provider  HTN Med:    Most Recent BP Reading(s)     Date:        BP:     01/07/2008   130/84     12/24/2007   160/80     06/24/2007   130/82    Documented patient preferred pharmacies:  [facility] ([facility]  Phone: [phone] Fax: [phone]

## 2008-08-03 NOTE — Telephone Encounter (Signed)
Staff Message copied by Lillia Dallas on Mon Aug 03, 2008 12:00 PM  ------   Message from: Riccardo Dubin   Created: Mon Aug 03, 2008 11:46 AM   Regarding: Rx Refill   Contact: [phone]    Mariah [name] is a 52 year old old female.    Patient's PCP: Ebbie Ridge. Lisette Grinder, MD    In case we get disconnected what is the best number to reach you at today:  Home Phone [phone] (home)    Person calling:  Patient (self)    How can I help you today:   Medication Refill(s):   [facility] ([facility]  Phone: [phone] Fax: [phone]    Is this your pharmacy? Yes  What medications do you want to refill?  PROTONIX  Medication for blood pressure. - She does not know the name of the medication      Patient's language of care: [place]    Would you like an interpreter when the nurse calls you back?  YES [place]

## 2008-09-21 ENCOUNTER — Ambulatory Visit (HOSPITAL_BASED_OUTPATIENT_CLINIC_OR_DEPARTMENT_OTHER): Payer: MEDICAID | Admitting: "Endocrinology

## 2008-09-21 VITALS — BP 137/88 | HR 91 | Resp 18 | Wt 216.0 lb

## 2008-09-21 DIAGNOSIS — M810 Age-related osteoporosis without current pathological fracture: Secondary | ICD-10-CM

## 2008-09-21 LAB — MEDICAL SPECIALTIES LETTER

## 2008-09-21 MED ORDER — ALENDRONATE SODIUM 70 MG PO TABS
ORAL_TABLET | ORAL | Status: AC
Start: 2008-09-21 — End: 2009-09-21

## 2008-09-21 NOTE — Progress Notes (Signed)
Dictated [phone]

## 2008-09-21 NOTE — Progress Notes (Signed)
Patient here for follow up visit Had thyroid surgery in the past .Has voiced no complaints since last visit.  Due to the language barrier, the phone call was conducted in [place] with an interpreter. The interpreter's name is Art. The interpreter was on the phone during the entire phone call.  Pt states she is safe at home.

## 2008-09-29 ENCOUNTER — Other Ambulatory Visit (HOSPITAL_BASED_OUTPATIENT_CLINIC_OR_DEPARTMENT_OTHER): Payer: Self-pay | Admitting: Ambulatory Care

## 2008-09-29 NOTE — Telephone Encounter (Signed)
Has 6 refills- daughter notified.

## 2008-09-29 NOTE — Telephone Encounter (Signed)
Staff Message copied by Windell Moulding on Tue Sep 29, 2008 10:30 AM  ------   Message from: Riccardo Dubin   Created: Tue Sep 29, 2008 10:24 AM   Regarding: Rx Refill   Contact: [phone]    Mariah [name] is a 52 year old old female.    Patient's PCP: Ebbie Ridge. Lisette Grinder, MD    In case we get disconnected what is the best number to reach you at today:  Home Phone [phone] (home)    Person calling:  Daughter: Mable Paris    How can I help you today:   Medication Refill(s):   [facility]  Phone: [phone] Fax: [phone]    Is this your pharmacy? Yes  What medications do you want to refill?  PROTONIX - Patient's daughter said that pt does not have anymore, needs it today      Patient's language of care: [place]    Would you like an interpreter when the nurse calls you back?  NO

## 2008-12-02 ENCOUNTER — Other Ambulatory Visit (HOSPITAL_BASED_OUTPATIENT_CLINIC_OR_DEPARTMENT_OTHER): Payer: Self-pay | Admitting: Ambulatory Care

## 2008-12-02 DIAGNOSIS — R12 Heartburn: Secondary | ICD-10-CM

## 2008-12-02 DIAGNOSIS — I119 Hypertensive heart disease without heart failure: Secondary | ICD-10-CM

## 2008-12-02 MED ORDER — IRBESARTAN 150 MG PO TABS
ORAL_TABLET | ORAL | Status: DC
Start: 2008-12-02 — End: 2012-03-21

## 2008-12-02 MED ORDER — PROTONIX 40 MG PO TBEC
DELAYED_RELEASE_TABLET | ORAL | Status: AC
Start: 2008-12-02 — End: 2009-03-02

## 2008-12-02 NOTE — Telephone Encounter (Signed)
Staff Message copied by Windell Moulding on Wed Dec 02, 2008 10:38 AM  ------   Message from: Riccardo Dubin   Created: Wed Dec 02, 2008 10:31 AM   Regarding: Rx Refill   Contact: [phone]    Mariah [name] is a 52 year old old female.    Patient's PCP: Ebbie Ridge. Lisette Grinder, MD    In case we get disconnected what is the best number to reach you at today:  Home Phone [phone] (home)    Person calling:  Patient (self)    How can I help you today:   Medication Refill(s):   [facility]  Phone: [phone] Fax: [phone]    Is this your pharmacy? Yes  What medications do you want to refill?  AVAPRO 150 MG  PROTONIX 40 MG      Patient's language of care: [place]    Would you like an interpreter when the nurse calls you back?  YES [place]

## 2008-12-02 NOTE — Telephone Encounter (Signed)
Patient last seen 1/09 by Dr.Carlson. Will need f/u with a new provider  Will ask front end to call pt and place on pending appt

## 2008-12-08 ENCOUNTER — Telehealth: Payer: Self-pay

## 2008-12-08 NOTE — Telephone Encounter (Signed)
Received p/a from Dr. Chilton Si, It was faxed to [facility].

## 2008-12-08 NOTE — Telephone Encounter (Signed)
Call to pharmacy - Avapro not covered under [facility]. If needed will require PA.  They cover ace inhibitors.  Please review and advise.

## 2008-12-08 NOTE — Telephone Encounter (Signed)
Staff Message copied by Ledell Peoples on Tue Dec 08, 2008 2:54 PM  ------   Message from: Lorenza Chick   Created: Tue Dec 08, 2008 2:49 PM   Regarding: Rx refill   Contact: [phone]    Mariah [name] is a 52 year old old female.    Patient's PCP: Ebbie Ridge. Lisette Grinder, MD    In case we get disconnected what is the best number to reach you at today:  Home Phone [phone] (home)    Person calling:  Daughter    How can I help you today:   Medication Refill(s):   [facility]  Phone: [phone] Fax: [phone]    Is this your pharmacy? Yes  What medications do you want to refill?  AVAPRO.  She is at the pharmacy now but she said they told her they can not fill it because of "insurance issues". She said her mother has no more BP medication and she has high BP.      Patient's language of care: [place]    Would you like an interpreter when the nurse calls you back?  Yes portuguese

## 2008-12-08 NOTE — Telephone Encounter (Signed)
Will try PA today

## 2008-12-09 NOTE — Telephone Encounter (Addendum)
Called pt with [facility] interpreter, notified her of her refill for Avapro that was approved, [facility] [id number], PA # [phone] will expire on 12/09/2010.

## 2008-12-15 ENCOUNTER — Encounter (HOSPITAL_BASED_OUTPATIENT_CLINIC_OR_DEPARTMENT_OTHER): Payer: Self-pay | Admitting: Internal Medicine

## 2009-01-07 ENCOUNTER — Ambulatory Visit (HOSPITAL_BASED_OUTPATIENT_CLINIC_OR_DEPARTMENT_OTHER): Payer: MEDICAID | Admitting: Internal Medicine

## 2010-11-28 ENCOUNTER — Encounter (HOSPITAL_BASED_OUTPATIENT_CLINIC_OR_DEPARTMENT_OTHER): Payer: Self-pay | Admitting: Registered Nurse

## 2010-11-28 ENCOUNTER — Emergency Department (HOSPITAL_BASED_OUTPATIENT_CLINIC_OR_DEPARTMENT_OTHER)
Admission: RE | Admit: 2010-11-28 | Disposition: A | Discharge status: Home / Self Care | Payer: Self-pay | Source: Emergency Department | Attending: Emergency Medicine | Admitting: Emergency Medicine

## 2010-11-28 LAB — CBC WITH PLATELET
HEMATOCRIT: 38.1 % (ref 36.0–48.0)
HEMOGLOBIN: 12.8 g/dl (ref 12.0–16.0)
MEAN CORP HGB CONC: 33.6 g/dl (ref 32.0–36.0)
MEAN CORPUSCULAR HGB: 27.7 pg (ref 27.0–33.0)
MEAN CORPUSCULAR VOL: 82.5 fl (ref 80.0–100.0)
MEAN PLATELET VOLUME: 7.6 fl (ref 6.4–10.8)
PLATELET COUNT: 193 10*3/uL (ref 150–400)
RBC DISTRIBUTION WIDTH: 13 % (ref 11.5–14.3)
RED BLOOD CELL COUNT: 4.62 M/uL (ref 4.50–5.10)
WHITE BLOOD CELL COUNT: 3.8 10*3/uL — ABNORMAL LOW (ref 4.0–10.8)

## 2010-11-28 LAB — URINE DIP (POINT OF CARE)
BILIRUBIN, URINE: NEGATIVE
GLUCOSE, URINE: NEGATIVE mg/dl
LEUKOCYTE ESTERASE: NEGATIVE
NITRITE, URINE: POSITIVE
PH URINE: 6 (ref 5.0–8.0)
SPECIFIC GRAVITY URINE: 1.015 (ref 1.003–1.030)
UROBILINOGEN URINE: 0.2 mg/dl (ref 0.2–1.0)

## 2010-11-28 LAB — HOLD GREEN TOP TUBE

## 2010-11-28 LAB — MANUAL WBC DIFFERENTIAL
ATYPICAL LYMPHOCYTES %: 6 % (ref 0–12)
BAND NEUTROPHILS %: 21 % (ref 0–8)
LYMPHOCYTES %: 8 % — ABNORMAL LOW (ref 13.0–39.0)
METAMYELOCYTES %: 2 % — ABNORMAL HIGH (ref 0–0)
MONOCYTES %: 13 % — ABNORMAL HIGH (ref 1–12)
PLATELET ESTIMATE: NORMAL
POLYMORPHONUCLEAR (SEGS) %: 50 % (ref 46.0–79.0)
RED BLOOD CELL MORPHOLOGY: NORMAL

## 2010-11-28 LAB — BASIC METABOLIC PANEL
ANION GAP: 10 mmol/L (ref 2–25)
BUN (UREA NITROGEN): 9 mg/dl (ref 6–20)
CALCIUM: 8.9 mg/dl (ref 8.6–10.3)
CARBON DIOXIDE: 26 mmol/L (ref 22–32)
CHLORIDE: 97 mmol/L — ABNORMAL LOW (ref 101–111)
CREATININE: 0.6 mg/dl (ref 0.4–1.2)
ESTIMATED GLOMERULAR FILT RATE: >60 mL/min (ref >60)
Glucose Random: 87 mg/dl (ref 74–160)
POTASSIUM: 3.5 mmol/L (ref 3.5–5.1)
SODIUM: 133 mmol/L — ABNORMAL LOW (ref 135–144)

## 2010-11-28 LAB — WBC DIFFERENTIAL SCAN

## 2010-11-28 MED ORDER — IBUPROFEN 800 MG PO TABS
ORAL_TABLET | ORAL | Status: AC
Start: 2010-11-28 — End: 2010-11-28
  Administered 2010-11-28: 800 mg via ORAL
  Filled 2010-11-28: qty 1

## 2010-11-28 MED ORDER — SODIUM CHLORIDE 0.9 % IV BOLUS
1000.00 mL | Freq: Once | INTRAVENOUS | Status: AC
Start: 2010-11-28 — End: 2010-11-28
  Administered 2010-11-28: 1000 mL via INTRAVENOUS

## 2010-11-28 MED ORDER — CIPROFLOXACIN IN D5W 400 MG/200ML IV SOLN
400.00 mg | Freq: Once | INTRAVENOUS | Status: AC
Start: 2010-11-28 — End: 2010-11-28
  Administered 2010-11-28: 400 mg via INTRAVENOUS
  Filled 2010-11-28: qty 200

## 2010-11-28 MED ORDER — KETOROLAC TROMETHAMINE 30 MG/ML IJ SOLN
30.00 mg | Freq: Once | INTRAMUSCULAR | Status: AC
Start: 2010-11-28 — End: 2010-11-28
  Administered 2010-11-28: 30 mg via INTRAVENOUS
  Filled 2010-11-28: qty 1

## 2010-11-28 MED ORDER — IBUPROFEN 800 MG PO TABS
800.0000 mg | ORAL_TABLET | Freq: Three times a day (TID) | ORAL | Status: AC | PRN
Start: 2010-11-28 — End: 2010-12-28

## 2010-11-28 MED ORDER — ONDANSETRON 4 MG PO TBDP
ORAL_TABLET | ORAL | Status: AC
Start: 2010-11-28 — End: 2010-11-28
  Administered 2010-11-28: 4 mg via ORAL
  Filled 2010-11-28: qty 1

## 2010-11-28 MED ORDER — ONDANSETRON 4 MG PO TBDP
4.0000 mg | ORAL_TABLET | Freq: Once | ORAL | Status: DC
Start: 2010-11-28 — End: 2010-11-28

## 2010-11-28 MED ORDER — CIPROFLOXACIN HCL 500 MG PO TABS
500.00 mg | ORAL_TABLET | Freq: Two times a day (BID) | ORAL | Status: AC
Start: 2010-11-28 — End: 2010-12-05

## 2010-11-28 MED ORDER — IBUPROFEN 800 MG PO TABS
800.0000 mg | ORAL_TABLET | Freq: Once | ORAL | Status: DC
Start: 2010-11-28 — End: 2010-11-28

## 2010-11-28 NOTE — Discharge Instructions (Signed)
Take antibiotics until done. Take motrin for fever. Drink plenty of fluids, follow up or return as needed

## 2010-11-28 NOTE — ED Notes (Signed)
Since Sat  Headache and fever

## 2010-11-30 LAB — URINE CULTURE/COLONY COUNT

## 2010-12-01 LAB — EMERGENCY ROOM NOTE

## 2012-02-12 ENCOUNTER — Ambulatory Visit (HOSPITAL_BASED_OUTPATIENT_CLINIC_OR_DEPARTMENT_OTHER): Payer: Self-pay | Admitting: Allergy

## 2012-03-20 ENCOUNTER — Encounter (HOSPITAL_BASED_OUTPATIENT_CLINIC_OR_DEPARTMENT_OTHER): Payer: Self-pay | Admitting: Registered Nurse

## 2012-03-20 LAB — URINALYSIS
BACTERIA: >50 PER HPF — AB (ref 0–5)
BILIRUBIN, URINE: NEGATIVE
CASTS: NONE SEEN PER LPF
CRYSTALS: NONE SEEN
GLUCOSE, URINE: NEGATIVE MG/DL
KETONE, URINE: NEGATIVE MG/DL
LEUKOCYTE ESTERASE: NEGATIVE
NITRITE, URINE: POSITIVE — AB
PH URINE: 5.5 (ref 5.0–8.0)
PROTEIN, URINE: NEGATIVE MG/DL
SPECIFIC GRAVITY URINE: 1.028 (ref 1.003–1.035)
SQUAMOUS EPITHELIAL CELLS: >10 PER LPF — AB (ref 0–4)

## 2012-03-20 LAB — CBC, PLATELET & DIFFERENTIAL
ABSOLUTE BASO COUNT: 0.1 10*3/uL (ref 0.0–0.1)
ABSOLUTE EOSINOPHIL COUNT: 0.1 10*3/uL (ref 0.0–0.8)
ABSOLUTE IMM GRAN COUNT: 0.01 10*3/uL (ref 0.00–0.03)
ABSOLUTE LYMPH COUNT: 2.3 10*3/uL (ref 0.6–5.9)
ABSOLUTE MONO COUNT: 0.5 10*3/uL (ref 0.2–1.4)
ABSOLUTE NEUTROPHIL COUNT: 3.4 10*3/uL (ref 1.6–8.3)
BASOPHIL %: 0.9 % (ref 0.0–1.2)
EOSINOPHIL %: 1.9 % (ref 0.0–7.0)
HEMATOCRIT: 38.6 % (ref 34.1–44.9)
HEMOGLOBIN: 12.2 g/dL (ref 11.2–15.7)
IMMATURE GRANULOCYTE %: 0.2 % (ref 0.0–0.4)
LYMPHOCYTE %: 36.5 % (ref 15.0–54.0)
MEAN CORP HGB CONC: 31.6 g/dL (ref 31.0–37.0)
MEAN CORPUSCULAR HGB: 26.6 pg (ref 26.0–34.0)
MEAN CORPUSCULAR VOL: 84.3 fL (ref 80.0–100.0)
MEAN PLATELET VOLUME: 9.5 fL (ref 8.7–12.5)
MONOCYTE %: 7.1 % (ref 4.0–13.0)
NEUTROPHIL %: 53.4 % (ref 40.0–75.0)
PLATELET COUNT: 317 10*3/uL (ref 150–400)
RBC DISTRIBUTION WIDTH STD DEV: 43.8 fL (ref 35.1–46.3)
RBC DISTRIBUTION WIDTH: 14.3 % (ref 11.5–14.3)
RED BLOOD CELL COUNT: 4.58 M/uL (ref 3.90–5.20)
WHITE BLOOD CELL COUNT: 6.4 10*3/uL (ref 4.0–11.0)

## 2012-03-20 LAB — D-DIMER PE/DVT, QUANTITATIVE: D-DIMER PE/DVT, QUANTITATIVE: 0.21 mg/L FEU (ref 0.00–0.49)

## 2012-03-20 LAB — POC URINALYSIS
BILIRUBIN, URINE: NEGATIVE
GLUCOSE,URINE: NEGATIVE
KETONE, URINE: NEGATIVE
LEUKOCYTE ESTERASE: NEGATIVE
NITRITE, URINE: POSITIVE — AB
PH URINE: 5.5 (ref 5.0–8.0)
SPECIFIC GRAVITY, URINE: >=1.03 (ref 1.003–1.030)
UROBILINOGEN URINE: 0.2 (ref 0.2–1.0)

## 2012-03-20 LAB — US ABDOMEN COMPLETE

## 2012-03-20 LAB — TROPONIN I: TROPONIN I: 0.02 ng/mL (ref 0.00–0.04)

## 2012-03-20 MED ORDER — ASPIRIN 81 MG PO CHEW
324.00 mg | CHEWABLE_TABLET | Freq: Once | ORAL | Status: AC
Start: 2012-03-20 — End: 2012-03-20
  Administered 2012-03-20: 324 mg via ORAL
  Filled 2012-03-20: qty 4

## 2012-03-20 MED ORDER — HYDROMORPHONE HCL PF 1 MG/ML IJ SOLN
0.25 mg | Freq: Once | INTRAMUSCULAR | Status: AC
Start: 2012-03-20 — End: 2012-03-20
  Administered 2012-03-20: 0.25 mg via INTRAVENOUS
  Filled 2012-03-20: qty 1

## 2012-03-20 MED ORDER — SODIUM CHLORIDE 0.9 % IV BOLUS
1000.00 mL | Freq: Once | INTRAVENOUS | Status: AC
Start: 2012-03-20 — End: 2012-03-20
  Administered 2012-03-20: 1000 mL via INTRAVENOUS

## 2012-03-20 MED ORDER — MORPHINE SULFATE (PF) 4 MG/ML IV SOLN
4.00 mg | Freq: Once | INTRAVENOUS | Status: AC
Start: 2012-03-20 — End: 2012-03-20
  Administered 2012-03-20: 4 mg via INTRAVENOUS

## 2012-03-20 MED ORDER — MORPHINE SULFATE (PF) 4 MG/ML IV SOLN
4.00 mg | Freq: Once | INTRAVENOUS | Status: AC
Start: 2012-03-20 — End: 2012-03-20
  Administered 2012-03-20: 4 mg via INTRAVENOUS
  Filled 2012-03-20: qty 1

## 2012-03-20 MED ORDER — LIDOCAINE VISCOUS 2 % MT SOLN
5.00 mL | Freq: Once | OROMUCOSAL | Status: AC
Start: 2012-03-20 — End: 2012-03-20
  Administered 2012-03-20: 5 mL via OROMUCOSAL
  Filled 2012-03-20: qty 15

## 2012-03-20 MED ORDER — ALUMINUM-MAGNESIUM-SIMETHICONE 200-200-20 MG/5ML PO SUSP
30.00 mL | Freq: Once | ORAL | Status: AC
Start: 2012-03-20 — End: 2012-03-20
  Administered 2012-03-20: 30 mL via ORAL
  Filled 2012-03-20: qty 30

## 2012-03-20 MED ORDER — LORAZEPAM 2 MG/ML IJ SOLN
1.00 mg | Freq: Once | INTRAMUSCULAR | Status: AC
Start: 2012-03-20 — End: 2012-03-20
  Administered 2012-03-20: 1 mg via INTRAVENOUS
  Filled 2012-03-20: qty 1

## 2012-03-20 MED ORDER — FAMOTIDINE 10 MG/ML IV SOLN
20.00 mg | Freq: Once | INTRAVENOUS | Status: AC
Start: 2012-03-20 — End: 2012-03-20
  Administered 2012-03-20: 20 mg via INTRAVENOUS
  Filled 2012-03-20: qty 2

## 2012-03-20 MED ORDER — MORPHINE SULFATE (PF) 4 MG/ML IV SOLN
INTRAVENOUS | Status: AC
Start: 2012-03-20 — End: 2012-03-20
  Administered 2012-03-20: 4 mg via INTRAVENOUS
  Filled 2012-03-20: qty 1

## 2012-03-20 NOTE — ED Notes (Signed)
Pt returned from CT, c/o 10/10 pain returned, RUQ.  Provider notified.

## 2012-03-20 NOTE — ED Triage Note (Signed)
Via [place] interpreter and daughter, pt report 4 day history of 9/10 non-radiating  RUQ pain.  Denies trauma.  Normal BM yesterday, had been feeling somewhat constipated prior to that.  Afebrile.  Denies N/V.    Aleve at midnight with +- effect. "nothing makes pain better or worse".

## 2012-03-20 NOTE — ED Notes (Addendum)
Pt to CT via w/c

## 2012-03-20 NOTE — ED Notes (Signed)
Pt c/o chest pain - increases with inspiration and with lying down.  12-lead EKG completed with Dr. Bethena Roys evaluating it.  Morphine and Ativan given - pt states that pain is decreasing once medication was given.  Interpreter used with all communication

## 2012-03-20 NOTE — ED Provider Notes (Signed)
Attestation    This 56 year old female patient was evaluated in the ED for chest pain.  Initial history and physical exam information was obtained by PA Rennis Chris, who also made a detailed record of this visit. I performed a history and physical examination of the patient and discussed management with PA.     I reviewed the patient's past medical history/problem list, past surgical history, medication list, social history and allergies.    The patient had come in with rt sided chest pain and shortness of breath.  She states that it has been getting worse over the last 4 days, especially worsening over the last two days.  She states it is associated with shortness of breath; it hurst especially with breathing.  No fever, cough, nausea, vomiting, dysuria, radiation of the pain.  The pain doesn't change with position.She has a history of smoking, htn, high cholesterol.  She had an uncle that had an MI in his 79's.      On my direct physical exam:The pt is obese, tearful, holding her right chest wall.  Heart regular. No murmurs, gallops, rubs, JVD.  No rales or rhonchi or labored breathing.  There is intermittently reproducible rt chest wall pain just below her rt breast.  Abd is soft and nontender.        Key lab findings: Urine with nitrite and bacteria, but also with numerous squamous epithelial cells. Culture pending.  CBC and CMP WNL except alk phos mild elevated at 142.  EKG normal sinus rhythm at 63 bpm, normal intervals, normal axis.  Chest xray viewed in conjunction with hospitalist, Dr. Selinda Orion, shows cardiomegaly and cephalization.  Chest CT showed no PE, but multiple incidental findings of cardiomegaly, trace rt pleural effusion, pulmonary nodules, small vessel dz vs pulm vasc congestion vs small airways.     Treatment:  Pt received aspirin and nitro without relief.  He experienced some relief after  Morphine and ativan.  Plan to admit for further evaluation of her chest pain and multiple incidental  findings worrisome for cardiac disease that is unknown to this pt who has no PCP.      The patient agrees with this plan and disposition.      Admission Impression: Chest pain, Cardiomegaly  Mory Herrman E. Fines Kimberlin,MD

## 2012-03-20 NOTE — ED Notes (Signed)
Pt much more calm, resting quietly.

## 2012-03-20 NOTE — ED Notes (Signed)
Pt had u/s without issue.  States no significant change in pain.  Resting quietly with daughter at bedside.

## 2012-03-20 NOTE — ED Notes (Signed)
Eval by Provider.  Labs with Line.  Medicated for pain.  Daughter at bedside.

## 2012-03-20 NOTE — ED Notes (Signed)
Return from [place].  Report to J. Bufford Buttner, RN

## 2012-03-20 NOTE — ED Notes (Signed)
Pt reports severe pain in RUQ, states worse when breathing, sitting or lying down.  MD back at bedside.  Pt tearful, slightly hyperventilating.  Repeat EKG done.  ST on monitor, O2 100% on r/a.

## 2012-03-20 NOTE — ED Notes (Signed)
Urine obtained, tested. Sent to lab for further analysis.  Eval by PA-C.

## 2012-03-20 NOTE — ED Provider Notes (Addendum)
eMERGENCY dEPARTMENT Physician Assistant NOTE    The ED nursing record was reviewed.   The prior medical records as available electronically through Epic were reviewed.  The mode of arrival was Relative on 03/20/2012  3:13 PM.    A [place] interpreter was used.    This patient was seen with Emergency Department attending physician Dr. Bethena Roys    CHIEF COMPLAINT    Patient presents with:    Abdominal Pain - RT RIB PAIN      HPI    Mariah [name] is a 56 year old female who presents with 4 days of right upper quadrant pain.  Pain is described as dull, constant without radiation.  States it increases with eating and coughing.  Took Aleve with minimal relief.  Reports a history kidney stones but this pain is not the same.  Reports a history of heartburn that she states feels the same but worse.  Last bowel movement was yesterday.  Had a total hysterectomy.  Denies headache, diaphoresis, fever, SOB, CP, vomiting, diarrhea, changes in urination, dysuria, increased urinary frequency with small amounts.      PAST MEDICAL HISTORY      Past Medical History    Screening for hypertension 08/05/2004    Comment: occ elevated BP started on anti-HTN in [place]; chem 7, EKG, HCT u/a wnl 4/03; d/c'ed HCTZ 8/05 when we learned from daughter she has not been taking it    ANXIETY DEPRESSION 08/05/2004    Comment: TSH wnl 8/02; trial of fluoxetine 8/05    PERS HX TOBACCO USE 08/05/2004    Comment: quit 10/02; 1/2 PPD since 56 YO    BACK PAIN LOW 08/05/2004    Comment: LBP musculoskeletal most likely 1/04    OBESITY NOS 08/05/2004    Comment: has tried meds from [place]    PERS HX OF PAST NONCOMPLIANCE 08/05/2004    Comment: poor compliance to med regimen    Hyperparathyroidism 10/22/2006    Comment: osteoporosis found - pt referred to surgery for the hyperparathyroidism- s/p parathyroidectomy feb 2008    Vitamin d deficiency 05/19/2008    Comment: Supplement per dr Jeannine Kitten.         PROBLEM LIST  Patient Active Problem List:     depressive  disorder [296.31]     TOBACCO USE DISORDER [305.1]     OBESITY NOS [278.00]     LATERAL FOOT PAIN [845.10]     BENIGN HYP HRT DIS W/O HRT FAIL [402.10]     ABNORMAL LIVER FUNCTION STUDY [794.8]     INSOMNIA NOS [780.52]     CALCULUS OF KIDNEY [592.0]     DIARRHEA NOS [787.91]     ABN FIND-STOOL CONTENTS-OCC BLOOD [792.1]     nodules on chest CT 1/06 and 3/07 [518.89]     HEARTBURN [787.1]     ALLERGIC RHINITIS NEC [477.8]     COUGH [786.2]     ABSENCE OF MENSTRUATION - s/p TAH BSO [626.0]     TUBERCULIN TEST REACTION NO TBC [795.5]     HYPERPARATHYROIDISM [252.0]     Osteoporosis [733.00C]     Murmur Heart [785.2G]     Family History of Throat Cancer [V16.0AR]     Vitamin D Deficiency [268.9G]      SURGICAL HISTORY      Past Surgical History    TOTAL ABDOMINAL HYSTERECT W/WO RMVL TUBE OVARY 5/00    Comment Hysterectomy, Total Abdominal secondary to fibroids    SALPINGO-OOPHORECTOMY COMPL/PRTL UNI/BI SPX  5/00    Comment Salpingo-Oophorectomy, bilateral         CURRENT MEDICATIONS    1. LOSARTAN POTASSIUM 50 MG PO TABS  Route: Oral GNF:AOZH 50 mg by mouth daily.  Dispense:  Refill:   2. IBUPROFEN 800 MG PO TABS  Route: Oral YQM:VHQI 1 tablet by mouth every 8 (eight) hours as needed for Pain. with food or milk  Dispense: 20 tablet Refill: 0  3. IRBESARTAN 150 MG PO TABS  Route: Oral Sig:1 tablet twice a day -  THIS WAS APPROVED BY [facility].  Dispense: 60 Refill: 2  Comment:THIS WAS APPROVED BY [facility] - SEE PA APPROVAL ...  4. PROTONIX 40 MG PO TBEC  Route: Oral Sig:1 tablet by mouth daily  Dispense: 30 Refill: 2  5. HYDROCHLOROTHIAZIDE 25 MG PO TABS  Route: Oral Sig:1 TABLET DAILY  Dispense: 30 Refill: 11  6. CLARITIN 10 MG PO TABS  Route: Oral Sig:1 TABLET BY MOUTH DAILY.  Dispense: 30 Refill: 5  7. PROAIR HFA 108 (90 BASE) MCG/ACT IN AERS  Route: Inhalation Sig:1 to 2 puffs every 4 to 6 hours as needed for cough.  Dispense: 1 Refill: 5  8. FLUTICASONE PROPIONATE  HFA 220 MCG/ACT IN AERO  Route: Inhalation  Sig:1 puff every 12 hours  Dispense: 1 Refill: 5  9. VITAMIN D 400 UNIT PO CAPS  Route: Oral Sig:2 capsules daily  Dispense: 30 Refill: 0    ALLERGIES    Review of Patient's Allergies indicates:   Lisinopril              Cough   Heparin                     Comment:local rash seen during hospitalization Feb 2008    FAMILY HISTORY      Family History    Cancer - Lung Father     Comment: died from lung CA- throat cancer per pt    Hypertension Mother     Lipids Mother     Comment: hyperlipidemia    Heart Maternal Uncle     Comment: died 63 from MI    Diabetes Maternal Aunt     Non-contributory      Comment: no first deg rel. w/ breast ca         SOCIAL HISTORY    Social History    Marital Status: Legally Separated   Spouse Name:                       Years of Education:                 Number of children:               Social History Main Topics    Smoking Status: Current Everyday Smoker         Packs/Day: .5    Years: 40        Types: Cigarettes    Comment: 2007    Alcohol Use: No              Drug Use: No            Social History Narrative    work: Land; owned Brewing technologist in [place]    Lives w/ daughter 24 YO (Grazielle Pena) & son-in-law Kern Alberta)    Divorced; currently not in a relationship        REVIEW OF SYSTEMS    The pertinent  positives are reviewed in the HPI above. All other systems were reviewed and are negative.    PHYSICAL EXAM      Vital Signs: BP 155/80   Pulse 79   Temp 98.1 F   Resp 20   Wt 92.987 kg   SpO2 100%     Constitutional: Well-developed, Well-nourished, No acute distress, Non-toxic appearance. Speaking full sentences.  Eyes: PERRL, EOMI, conjunctiva pink and moist, no scleral injection or discharge; No hyphema, no hypopyon; No scleral icterus.   HENT: Atraumatic; no battle signs, no racoon eyes. No drainage from nares or ears.  Neck:  Normal range of motion, non-tender, Supple with no meningismus. , no stridor.   Lymphatic: No lymphadenopathy noted.   Cardio.: RRR, No MRG's. Radial  pulses 2+ B/L  Lungs: Normal BS's bilat., No tachypnea, wheezes, rales or rhonchi;  Non-labored w/o retractions or accessory muscle use, or tripoding  Abd. Intermittent right sided abdominal pain in both RUQ and RLQ;  + BS throughout. Soft, ND, No masses, rebound or guarding. No Murphy's, McBurney's, or Rovsing's.  No striae, hernia, caput medusa or Turners;  GU: No CVA tenderness.  Musculoskeletal:  Moving all 4 extremities, No major deformities noted. Ambulatory with steady gait. No LE edema or Tenderness.  Skin: Warm &dry, no rashes, diaphoresis or cellulitic changes.  Neurological: CNII-XII grossly intact, AOx3, No focal deficits noted. No abnormal or stereotyped movements.   Psychiatric: Appropriate for age and situation.      RESULTS  Results for orders placed during the hospital encounter of 03/20/12 (from the past 24 hour(s))   POC URINALYSIS    Collection Time    03/20/12  3:49 PM   Component Value Comment    COLOR YELLOW      CLARITY CLEAR      GLUCOSE,URINE NEGATIVE      BILIRUBIN, URINE NEGATIVE      KETONE, URINE NEGATIVE      SPECIFIC GRAVITY, URINE >=1.030      UROBILINOGEN URINE 0.2      OCCULT BLOOD, URINE MODERATE (*)     PH URINE 5.5      PROTEIN, URINE TRACE      NITRITE, URINE POSITIVE (*)     LEUKOCYTE ESTERASE NEGATIVE     URINALYSIS    Collection Time    03/20/12  3:53 PM   Component Value Comment    COLOR YELLOW      CLARITY CLEAR      GLUCOSE, URINE NEGATIVE      BILIRUBIN, URINE NEGATIVE      KETONE, URINE NEGATIVE      SPECIFIC GRAVITY URINE 1.028      OCCULT BLOOD, URINE TRACE (*)     PH URINE 5.5      PROTEIN, URINE NEGATIVE      NITRITE, URINE POSITIVE (*)     LEUKOCYTE ESTERASE NEGATIVE      MICROSCOPIC SEE RESULTS (*)     WHITE BLOOD CELLS URINE RARE 0-2      RED BLOOD CELLS URINE RARE 0-2      BACTERIA >50 (*)     CRYSTALS NONE SEEN      CASTS NONE SEEN      SQUAMOUS EPITHELIAL CELLS >10 (*)    URINE CULTURE/COLONY COUNT    Collection Time    03/20/12  3:53  PM    Narrative:     Source Details->urine   CBC + PLT + AUTO DIFF    Collection Time  03/20/12  4:24 PM   Component Value Comment    WHITE BLOOD CELL 6.4      RED BLOOD CELL COUNT 4.58      HEMOGLOBIN 12.2      HEMATOCRIT 38.6      MEAN CORPUSCULAR VOL 84.3      MEAN CORPUSCULAR HGB 26.6      MEAN CORP HGB CONC 31.6      RBC DISTRIBUTION WIDTH STD 43.8      RBC DISTRIBUTION WIDTH 14.3      PLATELET COUNT 317      MEAN PLATELET VOLUME 9.5      NEUTROPHIL % 53.4      IMMATURE GRANULOCYTE % 0.2      LYMPHOCYTE % 36.5      MONOCYTE % 7.1      EOSINOPHIL % 1.9      BASOPHIL % 0.9      ABSOLUTE NEUT COUNT AUTO 3.4      ABSOLUTE IMM GRAN COUNT 0.01      ABSOLUTE LYMPH COUNT AUTO 2.3      ABSOLUTE MONO COUNT AUTO 0.5      ABSOLUTE EO COUNT AUTO 0.1      ABSOLUTE BASO COUNT AUTO 0.1     COMPREHENSIVE METABOLIC PANEL    Collection Time    03/20/12  4:24 PM   Component Value Comment    SODIUM 139      POTASSIUM 3.5      CHLORIDE 103      CARBON DIOXIDE 28      ANION GAP 8      CALCIUM 9.4      Glucose Random 84      BLOOD UREA NITROGEN 20      TOTAL PROTEIN 7.5      ALBUMIN 4.4      BILIRUBIN TOTAL 0.4      ALKALINE PHOSPHATASE 142 (*)     ASPARTATE AMINOTRANSFERAS 30      CREATININE 0.7      ESTIMATED GLOMERULAR FILT RATE > 60      ALANINE AMINOTRANSFERASE 30     LIPASE    Collection Time    03/20/12  4:24 PM   Component Value Comment    LIPASE 39     TROPONIN I    Collection Time    03/20/12  8:38 PM   Component Value Comment    TROPONIN I 0.02     D-DIMER PE/DVT, QUANTITATIVE    Collection Time    03/20/12  8:38 PM   Component Value Comment    D-DIMER PE/DVT, QUANTITATIVE 0.21      Narrative:     Current Anticoagulant (57)->None          RADIOLOGY  US abdominal: IMPRESSION:  Unremarkable abdominal ultrasound. The appendix is not visualized.    CXR: NAD - Dr. Bethena Roys    CT PE: A limited evaluation of the distal pulmonary arteries due to respiratory motion artifact and adjacent lung pathology-no  evidence of central pulmonary embolism.  2.  Mosaic attenuation bilaterally possibly representing small airways versus small vessel disease and/or pulmonary vascular congestion.  3.  Nonspecific subcentimeter pulmonary nodules.  Continue attenuation followup is recommended.  4.  Trace right pleural effusion.  5.  Cardiomegaly.  6.  Atherosclerotic aortic disease.  7.  Hepatic steatosis.      EKG:  NSR, Axis: Left axis deviation - minimal left ventricular hypertrophy; No evidence of ST-T wave changes, ischemia, QT prolongation or  heart block.         MEDICATIONS ADMINISTERED ON THIS VISIT    Medication Orders Placed This Encounter  sodium chloride 0.9 % IV bolus 1,000 mL   Sig:   famotidine (PEPCID) injection 20 mg   Sig:   lidocaine (XYLOCAINE) 2 % viscous solution 5 mL   Sig:   aluminum-magnesium hydroxide-simethicone (MYLANTA II) 200-200-20 MG/5ML suspension 30 mL   Sig:   HYDROmorphone (DILAUDID) injection 0.25 mg   Sig:   aspirin chewable tablet 324 mg   Sig:   morphine (PF) injection 4 mg   Sig:   lorazepam (ATIVAN) injection 1 mg   Sig:   morphine (PF) injection 4 mg   Sig:   morphine (PF) 4 MG/ML injection   Sig:    O'NEILL, JENNIFER: Cabinet Override  nitroGLYCERIN (NITROSTAT) SL tablet 0.4 mg   Sig:   nitroGLYCERIN (NITROSTAT) SL tablet 0.4 mg   Sig:     ED COURSE & MEDICAL DECISION MAKING      I reviewed the patient's past medical history/problem list, past surgical history, medication list, social history and allergies.    Arrival: Pt arrived in stable condition and required no immediate interventions.    ED Decision Making & Course: Patient is a 56 year old female who presents with 4 days of right upper quadrant pain.  On my exam she was intermittently tender in the right upper and right lower quandrants.  Raising concerns for biliary tract disease, hepatitis and appendicitis although pt stated it felt just like her heartburn only worse. CBC, CMP and lipase were remarkable only for Alk Phos of 142.  Reducing concern for acute hepatitis.  GI cocktail did not provide any relief. [place] of abdomen was did not show any gallbladder issues. Re-examination did not demonstrate any RLQ pain at this time and given lack of fever or WBC appendicitis was reclassed to bottom of differential. Urine demonstrated nitrites and large amounts of bacteria but pt denied any urinary symptoms making this possibly asymptomatic bacteria - without fever, CVAT or flank pain pyelo was thought to be unlikely. During Dr. Dionne Milo examination pt reported that it was more right sided chest pain than abdominal pain. ASA and SL NTG had minimal effect.  EKG and troponin's were normal reducing concern for cardiac ischemia or infarction though these could not be ruled out. CXR did not demonstrate PNA, effusion, PTX, or widened mediastinum. PE could not be ruled out by [facility] score due to age though d-dimer was negative. Given issues controlling pain (requiring morphine and ativan with ativan giving the best effect) and now continued complaints of right sided CP with breathing despite lack of respiratory distress or desaturation - a CT PE was obtained. This did not demonstrate any large central PE but did have a trace right pleural effusion, cardiomegaly and hepatic steatosis. Given inability to localize a cause of her pain, issues controlling her pain, and inability to rule out NSTEMI the pt was admitted for cardiac monitoring and for continued pain control.  Pt remained hemodynamically stable during their stay in the emergency department.     CONDITION AT ADMIT  Stable and Improved    ADMIT DIAGNOSIS  786.43F Chest pain  429.3 Cardiomegaly      Electronically signed by:   Vickey Sages, PAC, 03/20/2012 4:05 PM  Dept.of Emergency Medicine  [facility]

## 2012-03-20 NOTE — ED Notes (Signed)
Pt to US.

## 2012-03-20 NOTE — ED Notes (Signed)
Labs drawn, EKG done.  Pt states of returning pain 7/10.  Provider notified.

## 2012-03-20 NOTE — ED Notes (Signed)
Resting. pai 3-4/10. Awaiting [place].

## 2012-03-21 ENCOUNTER — Inpatient Hospital Stay (HOSPITAL_BASED_OUTPATIENT_CLINIC_OR_DEPARTMENT_OTHER)
Admission: RE | Admit: 2012-03-21 | Disposition: A | Discharge status: Home / Self Care | Payer: Self-pay | Source: Emergency Department | Attending: Internal Medicine | Admitting: Internal Medicine

## 2012-03-21 DIAGNOSIS — J90 Pleural effusion, not elsewhere classified: Secondary | ICD-10-CM | POA: Insufficient documentation

## 2012-03-21 LAB — COMPREHENSIVE METABOLIC PANEL
ALANINE AMINOTRANSFERASE: 30 IU/L (ref 7–35)
ALBUMIN: 4.4 g/dl (ref 3.4–4.8)
ALKALINE PHOSPHATASE: 142 IU/L — ABNORMAL HIGH (ref 25–106)
ANION GAP: 8 mmol/L (ref 3–11)
ASPARTATE AMINOTRANSFERASE: 30 IU/L (ref 8–34)
BILIRUBIN TOTAL: 0.4 mg/dl (ref 0.2–1.1)
BUN (UREA NITROGEN): 20 mg/dl (ref 6–20)
CALCIUM: 9.4 mg/dl (ref 8.6–10.3)
CARBON DIOXIDE: 28 mmol/L (ref 22–32)
CHLORIDE: 103 mmol/L (ref 101–111)
CREATININE: 0.7 mg/dl (ref 0.4–1.2)
ESTIMATED GLOMERULAR FILT RATE: >60 mL/min (ref >60)
Glucose Random: 84 mg/dl (ref 74–160)
POTASSIUM: 3.5 mmol/L (ref 3.5–5.1)
SODIUM: 139 mmol/L (ref 135–144)
TOTAL PROTEIN: 7.5 g/dl (ref 5.9–7.5)

## 2012-03-21 LAB — CBC WITH PLATELET
HEMATOCRIT: 33.9 % — ABNORMAL LOW (ref 34.1–44.9)
HEMOGLOBIN: 11 g/dL — ABNORMAL LOW (ref 11.2–15.7)
MEAN CORP HGB CONC: 32.4 g/dL (ref 31.0–37.0)
MEAN CORPUSCULAR HGB: 27.6 pg (ref 26.0–34.0)
MEAN CORPUSCULAR VOL: 85.2 fL (ref 80.0–100.0)
MEAN PLATELET VOLUME: 9.8 fL (ref 8.7–12.5)
PLATELET COUNT: 270 10*3/uL (ref 150–400)
RBC DISTRIBUTION WIDTH STD DEV: 45.3 fL (ref 35.1–46.3)
RBC DISTRIBUTION WIDTH: 14.4 % — ABNORMAL HIGH (ref 11.5–14.3)
RED BLOOD CELL COUNT: 3.98 M/uL (ref 3.90–5.20)
WHITE BLOOD CELL COUNT: 5.6 10*3/uL (ref 4.0–11.0)

## 2012-03-21 LAB — LIPASE: LIPASE: 39 U/L (ref 10–50)

## 2012-03-21 LAB — GAMMA GLUTAMYL TRANSFERASE: GAMMA GLUTAMYL TRANSFERASE: 190 IU/L — ABNORMAL HIGH (ref 5–71)

## 2012-03-21 LAB — TROPONIN I
TROPONIN I: 0.02 ng/mL (ref 0.00–0.04)
TROPONIN I: 0.01 ng/mL (ref 0.00–0.04)

## 2012-03-21 LAB — EKG

## 2012-03-21 LAB — CTA CHEST W CONTRAST PE PROTOCOL

## 2012-03-21 LAB — B-TYPE NATRIURETIC PEPTIDE: B-TYPE NATRIURETIC PEPTIDE: 22 pg/mL (ref 5–100)

## 2012-03-21 LAB — XR CHEST PORTABLE

## 2012-03-21 MED ORDER — IBUPROFEN 400 MG PO TABS
400.00 mg | ORAL_TABLET | Freq: Once | ORAL | Status: AC
Start: 2012-03-21 — End: 2012-03-21
  Administered 2012-03-21: 400 mg via ORAL
  Filled 2012-03-21: qty 1

## 2012-03-21 MED ORDER — MORPHINE SULFATE (PF) 4 MG/ML IV SOLN
4.0000 mg | INTRAVENOUS | Status: DC | PRN
Start: 2012-03-21 — End: 2012-03-21

## 2012-03-21 MED ORDER — BENZONATATE 100 MG PO CAPS
100.00 mg | ORAL_CAPSULE | Freq: Three times a day (TID) | ORAL | Status: AC | PRN
Start: 2012-03-21 — End: 2012-04-20

## 2012-03-21 MED ORDER — SENNOSIDES 8.6 MG PO TABS
1.0000 | ORAL_TABLET | Freq: Every evening | ORAL | Status: DC
Start: 2012-03-21 — End: 2012-03-21

## 2012-03-21 MED ORDER — IRBESARTAN 150 MG PO TABS
75.0000 mg | ORAL_TABLET | Freq: Every day | ORAL | Status: DC
Start: 2012-03-21 — End: 2012-03-21
  Administered 2012-03-21: 75 mg via ORAL
  Filled 2012-03-21: qty 1

## 2012-03-21 MED ORDER — NITROGLYCERIN 0.4 MG SL SUBL
0.40 mg | SUBLINGUAL_TABLET | Freq: Once | SUBLINGUAL | Status: AC
Start: 2012-03-21 — End: 2012-03-21
  Administered 2012-03-21: 0.4 mg via SUBLINGUAL
  Filled 2012-03-21: qty 1

## 2012-03-21 MED ORDER — NICOTINE 7 MG/24HR TD PT24
1.0000 | MEDICATED_PATCH | Freq: Every day | TRANSDERMAL | Status: DC
Start: 2012-03-21 — End: 2012-03-21
  Administered 2012-03-21: 1 via TRANSDERMAL
  Filled 2012-03-21: qty 1

## 2012-03-21 MED ORDER — DOCUSATE SODIUM 100 MG PO CAPS
100.0000 mg | ORAL_CAPSULE | Freq: Two times a day (BID) | ORAL | Status: DC
Start: 2012-03-21 — End: 2012-03-21
  Administered 2012-03-21: 100 mg via ORAL
  Filled 2012-03-21: qty 1

## 2012-03-21 MED ORDER — NICOTINE 14 MG/24HR TD PT24
1.00 | MEDICATED_PATCH | TRANSDERMAL | Status: AC
Start: 2012-03-21 — End: 2012-04-20

## 2012-03-21 MED ORDER — FAMOTIDINE 20 MG PO TABS
20.0000 mg | ORAL_TABLET | Freq: Two times a day (BID) | ORAL | Status: DC
Start: 2012-03-21 — End: 2012-03-21
  Administered 2012-03-21: 20 mg via ORAL
  Filled 2012-03-21: qty 1

## 2012-03-21 MED ORDER — HEPARIN SODIUM (PORCINE) 5000 UNIT/ML IJ SOLN
5000.0000 [IU] | Freq: Three times a day (TID) | INTRAMUSCULAR | Status: DC
Start: 2012-03-21 — End: 2012-03-21

## 2012-03-21 MED ORDER — IBUPROFEN 800 MG PO TABS
800.0000 mg | ORAL_TABLET | Freq: Three times a day (TID) | ORAL | Status: AC | PRN
Start: 2012-03-21 — End: 2012-04-20

## 2012-03-21 NOTE — H&P (Signed)
INTERN H&P    Patient: Mariah [name], 56 year old female  HPI: Mariah [name] 55 year old portuguese-speaking woman with PMH of hyperparathyroid, lung nodules, smoker, NAFLD, htn, who p/w with RUQ/R lower thoracic pain x4 days.  Gradual onset and progressive pain.  Now 7/10, woke her from sleep last night and she knew she had to come in.  Worse with inspiration and movement, she has taken tylenol without relief.  Denies leg swelling, hemoptysis, SOB.  Not associated with eating, no n/v/d, no pale colored stools.  Some constipation.  Denies L sided CP associated, although once in a while (last time was 2 weeks ago), she has L sided CP at night, accompanied by sour taste, always in bed at night, and resolves with tylenol.  As far as family hx, father had MI in 68s, maternal uncle died of MI in 12s, and sister died of stroke at 42.  No personal or fam hx of blood clots.  No orthopnea, PND, good exercise tolerance.  No hx of lung disease, does not have a problem with wheezing.  Does have a chronic cough, which she attributes to allergies - it is non-productive, only happens at night, and has been present for many years.  Denies joint pain, rash, dry eyes/mouth.  Denies fever, wt loss, has not been ill recently.  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  Had positive PPD in past.    ED Course: in ED, vitals were: 98.1 F (36.7 C) 79 20 155/80 mmHg 100 % . RUQ [place] was negative and CT-PE did not show obvious PE but showed ground glass and ?congestion with possible mild cardiomegaly. She received ASA, morphine, ativan, nitro, dilaudid.  ROS: Per HPI.    Problem List:  Patient Active Problem List:     depressive disorder [296.31]     TOBACCO USE DISORDER [305.1]     OBESITY NOS [278.00]     LATERAL FOOT PAIN [845.10]     BENIGN HYP HRT DIS W/O HRT FAIL [402.10]     ABNORMAL LIVER FUNCTION STUDY [794.8]     INSOMNIA NOS [780.52]     CALCULUS OF KIDNEY [592.0]      DIARRHEA NOS [787.91]     ABN FIND-STOOL CONTENTS-OCC BLOOD [792.1]     nodules on chest CT 1/06 and 3/07 [518.89]     HEARTBURN [787.1]     ALLERGIC RHINITIS NEC [477.8]     COUGH [786.2]     ABSENCE OF MENSTRUATION - s/p TAH BSO [626.0]     TUBERCULIN TEST REACTION NO TBC [795.5]     HYPERPARATHYROIDISM [252.0]     Osteoporosis [733.00C]     Murmur Heart [785.2G]     Family History of Throat Cancer [V16.0AR]     Vitamin D Deficiency [268.9G]        Past Surgical History    TOTAL ABDOMINAL HYSTERECT W/WO RMVL TUBE OVARY 5/00    Comment Hysterectomy, Total Abdominal secondary to fibroids    SALPINGO-OOPHORECTOMY COMPL/PRTL UNI/BI SPX 5/00    Comment Salpingo-Oophorectomy, bilateral         Medications Prior to Admission: Unclear - seems that her only current meds are losartan and tylenol    Prior to Admission Medications   Medication Last Dose Informant Patient Reported? Taking?   losartan (COZAAR) 50 MG tablet   Yes No   Take 50 mg by mouth daily.   ibuprofen (MOTRIN) 800 MG tablet  No No   Take 1 tablet by mouth every 8 (eight) hours as needed for Pain. with food or milk   IRBESARTAN 150 MG OR TABS   No No   1 tablet twice a day -  THIS WAS APPROVED BY [facility].   PROTONIX 40 MG OR TBEC   No No   1 tablet by mouth daily   HYDROCHLOROTHIAZIDE 25 MG OR TABS   No No   1 TABLET DAILY   CLARITIN 10 MG OR TABS   No No   1 TABLET BY MOUTH DAILY.   PROAIR HFA 108 (90 BASE) MCG/ACT IN AERS   No No   1 to 2 puffs every 4 to 6 hours as needed for cough.   FLUTICASONE PROPIONATE  HFA 220 MCG/ACT IN AERO   No No   1 puff every 12 hours   VITAMIN D 400 UNIT OR CAPS   Yes No   2 capsules daily          Review of Patient's Allergies indicates:   Lisinopril              Cough   Heparin                     Comment:local rash seen during hospitalization Feb 2008      Family History    Cancer - Lung Father     Comment: died from lung CA- throat cancer per pt    Hypertension Mother     Lipids Mother     Comment: hyperlipidemia     Heart Maternal Uncle and Father (father had MI in 70s)     Comment: died 41 from MI    Diabetes Maternal Aunt     Non-contributory      Comment: no first deg rel. w/ breast ca   Sister died of stroke in her early 44s        Social History   Marital Status: Legally Separated  Spouse Name: N/A    Years of Education: N/A  Number of Children: N/A     Occupational History  None on file     Social History Main Topics   Smoking status: Current Everyday Smoker  0.5 Packs/Day  For 40.00 Years     Types: Cigarettes    Smokeless tobacco:     Comment:     Alcohol Use: No    Drug Use: No    Sexually Active: Not on file     Other Topics Concern   None on file     Social History Narrative    work: Land; owned bakery in [place]    Lives w/ daughter 24 YO (Graz[place]) & son-in-law Kern [place])    Divorced; currently not in a relationship         PHYSICAL EXAM:   Vital Signs - Last 24 Hours:   BP 134/81   Pulse 56   Temp(Src) 98 F (36.7 C) (Oral)   Resp 18   Wt 201 lb 3.2 oz (91.264 kg)   SpO2 100%  Temp:  [98 F (36.7 C)-98.1 F (36.7 C)] 98 F (36.7 C)  Pulse:  [56-87] 56   Resp:  [12-20] 18   BP: (114-171)/(62-86) 134/81 mmHg    Exam:  General appearance: WD, WN in NAD  HEENT: NC/AT, PERRLA, EOMI, sclerae anicteric  Neck: Supple, no JVD, ?slight hepatojugular reflex  Lungs: CTAB, no w/r/r, normal WOB  Cardiac: RRR, normal S1,S2, no m/r/g  Abdomen: NABS, soft, mildly tender to deep palpation, negative Murphy's, no rebound tenderness/guarding, no HSM.  Extremities: WWP, no edema, 2+ pulses  LN: no cervical/supraclavicular/axillary/inguinal LAD  Skin: no rashes, lesions, scaling      T/L/D:   Peripheral IV 03/20/12 (Active)   Site Assessment Clean;Dry;Intact 03/21/12 03:00 AM   Line Status Saline locked 03/21/12 03:00 AM   Dressing Status Clean;Dry;Intact 03/21/12 03:00 AM   Dressing Intervention New dressing 03/21/12 03:00 AM   Number of days:0       LABS/MICRO/IMAGING/DIAG:   Recent Labs:     [facility] 03/20/12 1624   NA 139    K 3.5   CL 103   CO2 28   BUN 20   CREAT 0.7   GLUCOSER 84   CA 9.4   WBC 6.4   HGB 12.2   HCT 38.6   PLTA 317   AST 30   ALT 30   TBILI 0.4   ALKPHOS 142*   Lipase 22  Trop: 0.02  BNP: 22  Results for CHYLA, SCHLENDER (MRN [id number]) as of 03/21/2012 06:29   Ref. Range 03/20/2012 15:53   COLOR Latest Range: Low: YELLOW  YELLOW   CLARITY Latest Range: Low: CLEAR  CLEAR   GLUCOSE, URINE Latest Range: Low: NEGATIVE  NEGATIVE   BILIRUBIN, URINE Latest Range: Low: NEGATIVE  NEGATIVE   KETONE, URINE Latest Range: Low: NEGATIVE MG/DL NEGATIVE   OCCULT BLOOD, URINE Latest Range: Low: NEGATIVE  TRACE (A)   PH URINE Latest Range: 5.0-8.0  5.5   PROTEIN, URINE Latest Range: Low: NEGATIVE MG/DL NEGATIVE   NITRITE, URINE Latest Range: Low: NEGATIVE  POSITIVE (A)   LEUKOCYTE ESTERASE Latest Range: Low: NEGATIVE  NEGATIVE   MICROSCOPIC Latest Range: Low: NOT INDICAT  SEE RESULTS (A)   WHITE BLOOD CELLS URINE Latest Range: 0-4 PER HPF RARE 0-2   RED BLOOD CELLS URINE Latest Range: 0-2 PER HPF RARE 0-2   BACTERIA Latest Range: 0-5 PER HPF >50 (A)   CRYSTALS Latest Range: Low: NONE SEEN  NONE SEEN   CASTS Latest Range: Low: NONE SEEN PER LPF NONE SEEN   SQUAMOUS EPITHELIAL CELLS Latest Range: 0-4 PER LPF >10 (A)     EKG:  NSR, no STE, TWI    Imaging:   CT-PE: per verbal signout from ED, radiology cannot rule out small PE due to motion artifact.  Has ground glass and small R pleural effusion.  Also cardiomegaly    RUQ ultrasound: negative  2007 CT: Multiple small pulm nodules likely c/w granuloma  Additional Diagnostic Testing:  PFTs September 2007 -- some obstruction at mid to low lung volumes not correctable by bronchodilator    Prior Echo:   none    ASSESSMENT/PLAN:   Mariah [name] 56 year old woman with PMH of hyperparathyroid, lung nodules, smoker, NAFLD, htn, who p/w with RUQ/R lower thoracic pain x4 days, found to have CT with ground glass and small R pleural effusion  # Ground glass markings and cephalization on CT.  Pt had  pulmonary nodules in the past that were read as possible granulomas.  DDx includes pulm edema 2/2 to [facility], ACS precipitating pulm edema sarcoidosis, ILD such as rheum causes, interstitial pneumonias.  Also considering hypersensitivity pneumonitis, ABPA, bronchoalveolar carcinoma.  Does not appear to be CHF with no signs of overload on exam and very low BNP at 22, however she had engorged pulmonary vessels on CT scan.  The story is not typical for ACS (the duration,  absence of SOB, location of pain), but she should be ruled out, as ACS could be atypical.  Doesn't have known exposures to chemical irritant. Interstitial pna is possible given chronic cough and ground glass on CT.  The fact that she has a chronic cough at night could be suggestive of a hypersensitivity pneumonitis (but more common causes such as GERD, asthma, PN drip are more likely to be causing it).  ABPA is less likely without hx of asthma, she has allergies, and her chronic cough could be a cough variant asthma.   As far as TB, she has hx of positive PPD, s/p: 4 mo rifampin in 2007, and no evidence of active disease on imaging.  - cycle trops  - consider pulm consult  - consider echo given apparent pulm congestion on CT and possible cardiomegaly, perhaps can be done as outpatient  - HIV test (can cause interstitial pna)  # RUQ/lower right thoracic pain.  DDx includes pleural based pain possibly secondary to effusion, small PE, referred cardiac pain, radicular pain, pre-zoster, could be a bony lesion (elevated alk phos, but nothing seen on CT), also considered hepatitis (but LFTs are normal except alk phos), pancreatitis (but normal lipase), gastritis.  CT-PE could not rule out a small PE due to motion.    - morphine 4 Q3 PRN  - pepcid 20 BID  - consider rib film  - f/u GGT to determine if alk phos is due to bone or liver  - for effusion, see below.  # R pleural effusion.  Appears too small to be tapped.  - f/u official read of CT regarding size of  effusion - does not appear to be loculated, but elevated concern giving pain in that area  # Htn  - continue cozaar  # Meds  - clarify med list with pt and dtr in the AM (takes meds she brought from [place])   PPX:   - DVT prophylaxis: heparin  Code: full  Dispo: home with family when stable    Discussed with attending physician Dr. Wilson Singer PGY1  Pager: [phone]

## 2012-03-21 NOTE — ED Notes (Signed)
Report to [facility].  Ambulance called for transport.

## 2012-03-21 NOTE — Consults (Signed)
- PULMONOLOGY CONSULT NOTE -    Reason for Consult: Ground glass opacity and pleural effusion on CXR      Consult Requested by: Dr. Vernie Shanks, MD    HPI:  [phone] with hx of hyperparathyroid, lung nodules, tobacco abuse, Fatty liver, HTN, and positive PPD s/p 4 months of Rifamate in 2007, who p/w with pleuritic lower right sided chest pain which started acutely 4 days ago and has felt progressive. The pain is sharp, the patient makes a jabbing motion with an outstretched finger to illustrate the sensation she feels.  It is clearly worse with inspiration and movement, but other precipitants are hard to identify.  She has taken tylenol without relief. She's had no leg swelling, hemoptysis, night sweats, weight loss, and clearly states that she is not SOB but finds breathing difficult because inspiration exacerbates the pain.  She has a history of latent TB infection but has completed a 4 month course of Rifamate. She has had no worsening of her pain related to meals and does not feel that the pain is in her belly. Her pain has been entirely right sided.  Her pain does not change with exertion.  Perhaps notable is that she has had left sided chest pain on and off in the past including a very severe episode several months ago for which she did not see a doctor. She has no personal history of MI or CAD but has a strong family hx: father had MI in 58s, maternal uncle died of MI in 77s, and sister died of stroke at 71.  She has no personal history of DVT or PE. Denies orthopnea, PND, does state she gets short of breath going up stairs but says this has been the case for many years. No hx of lung disease that she knows of but has smoked for a long time. She states she's had a dry cough for many months.  She endorses heartburn, seasonal allergy symptoms, joint point somewhat diffusely but especially in the fingers, does not feel that she gets rashes in the sun all the time but believes she currently has some skin on her RUE  and chest that has a rash. She works as Financial trader.      When she was seen in the ED she was noted to have vitals: 98.1 F (36.7 C) 79 20 155/80 mmHg 100 % . RUQ [place] was negative and CT-PE did not show obvious PE but showed ground glass and ?congestion with possible mild cardiomegaly. She received ASA, morphine, ativan, nitro, dilaudid, and was admitted.  Since admission her symptoms have improved a little bit by her report, but she still is having paroxysms of the pain with unclear trigger.  Overall she feels well enough to go home but is nervous to do so as she does not know what is causing the pain and feels her pain could be better controlled.       Inpatient Problem List:   Patient Active Problem List:     depressive disorder [296.31]     TOBACCO USE DISORDER [305.1]     OBESITY NOS [278.00]     LATERAL FOOT PAIN [845.10]     BENIGN HYP HRT DIS W/O HRT FAIL [402.10]     ABNORMAL LIVER FUNCTION STUDY [794.8]     INSOMNIA NOS [780.52]     CALCULUS OF KIDNEY [592.0]     DIARRHEA NOS [787.91]     ABN FIND-STOOL CONTENTS-OCC BLOOD [792.1]     nodules  on chest CT 1/06 and 3/07 [518.89]     HEARTBURN [787.1]     ALLERGIC RHINITIS NEC [477.8]     COUGH [786.2]     ABSENCE OF MENSTRUATION - s/p TAH BSO [626.0]     TUBERCULIN TEST REACTION NO TBC [795.5]     HYPERPARATHYROIDISM [252.0]     Osteoporosis [733.00C]     Murmur Heart [785.2G]     Family History of Throat Cancer [V16.0AR]     Vitamin D Deficiency [268.9G]     Pleurisy with effusion [511.9L]      Past Medical History:     Past Medical History    Screening for hypertension 08/05/2004    Comment: occ elevated BP started on anti-HTN in [place]; chem 7, EKG, HCT u/a wnl 4/03; d/c'ed HCTZ 8/05 when we learned from daughter she has not been taking it    ANXIETY DEPRESSION 08/05/2004    Comment: TSH wnl 8/02; trial of fluoxetine 8/05    PERS HX TOBACCO USE 08/05/2004    Comment: quit 10/02; 1/2 PPD since 56 YO    BACK PAIN LOW 08/05/2004    Comment: LBP  musculoskeletal most likely 1/04    OBESITY NOS 08/05/2004    Comment: has tried meds from [place]    PERS HX OF PAST NONCOMPLIANCE 08/05/2004    Comment: poor compliance to med regimen    Hyperparathyroidism 10/22/2006    Comment: osteoporosis found - pt referred to surgery for the hyperparathyroidism- s/p parathyroidectomy feb 2008    Vitamin d deficiency 05/19/2008    Comment: Supplement per dr Jeannine Kitten.         Past Surgical History:     Past Surgical History    TOTAL ABDOMINAL HYSTERECT W/WO RMVL TUBE OVARY 5/00    Comment Hysterectomy, Total Abdominal secondary to fibroids    SALPINGO-OOPHORECTOMY COMPL/PRTL UNI/BI SPX 5/00    Comment Salpingo-Oophorectomy, bilateral         Medications prior to Admission:     No current facility-administered medications on file prior to encounter.  Current outpatient prescriptions ordered prior to encounter:  losartan (COZAAR) 50 MG tablet Take 50 mg by mouth daily. Disp:  Rfl:    ibuprofen (MOTRIN) 800 MG tablet Take 1 tablet by mouth every 8 (eight) hours as needed for Pain. with food or milk Disp: 20 tablet Rfl: 0   IRBESARTAN 150 MG OR TABS 1 tablet twice a day -  THIS WAS APPROVED BY [facility]. Disp: 60 Rfl: 2   PROTONIX 40 MG OR TBEC 1 tablet by mouth daily Disp: 30 Rfl: 2   HYDROCHLOROTHIAZIDE 25 MG OR TABS 1 TABLET DAILY Disp: 30 Rfl: 11   CLARITIN 10 MG OR TABS 1 TABLET BY MOUTH DAILY. Disp: 30 Rfl: 5   PROAIR HFA 108 (90 BASE) MCG/ACT IN AERS 1 to 2 puffs every 4 to 6 hours as needed for cough. Disp: 1 Rfl: 5   FLUTICASONE PROPIONATE  HFA 220 MCG/ACT IN AERO 1 puff every 12 hours Disp: 1 Rfl: 5   VITAMIN D 400 UNIT OR CAPS 2 capsules daily Disp: 30 Rfl: 0         Current Inpatient Medications:         nitroGLYCERIN  0.4 mg Sublingual Once    nitroGLYCERIN  0.4 mg Sublingual Once    irbesartan  75 mg Oral Daily    famotidine  20 mg Oral BID    nicotine  1 patch Transdermal Daily    docusate  sodium  100 mg Oral BID    sennosides  1 tablet Oral Nightly     ibuprofen  400 mg Oral Once    DISCONTD: heparin (porcine)  5,000 Units Subcutaneous Q8H [facility]    aspirin  324 mg Oral Once    morphine  4 mg Intravenous Once    lorazepam  1 mg Intravenous Once    morphine  4 mg Intravenous Once             morphine 4 mg Q3H PRN         Allergies:   Review of Patient's Allergies indicates:   Lisinopril              Cough   Heparin                     Comment:local rash seen during hospitalization Feb 2008    Tobacco Use:     Smoking status: Current Everyday Smoker 0.5 Packs/Day For 40.00 Years   Types: Cigarettes   Smokeless tobacco:    Comment:          Alcohol:     Alcohol Use: No         Family History:     Family History    Cancer - Lung Father     Comment: died from lung CA- throat cancer per pt    Hypertension Mother     Lipids Mother     Comment: hyperlipidemia    Heart Maternal Uncle     Comment: died 76 from MI    Diabetes Maternal Aunt     Non-contributory      Comment: no first deg rel. w/ breast ca            Review of Systems:   Endorses chronic cough, joint pain especially in fingers, mild rash on right upper arm and chest, fatigue with exertion, intermittent long standing chest pain, intermittent heart burn.   Denies fever, chills, hemoptysis, weight loss, night sweats    Physical Exam:   GEN: NAD, A/O x 3, breathing comfortably  CV: RRR, no m/r/g  Abd: Soft, Non-tender to palpation including RUQ, BS+  Pulm: CTAB, normal work of breathing, comfortable  Ext: No LE edema       Vital Signs - Last 24 Hours:   Temp:  [97.9 F (36.6 C)-98.3 F (36.8 C)] 98.3 F (36.8 C)  Pulse:  [56-87] 79   Resp:  [12-20] 18   BP: (114-171)/(62-86) 136/72 mmHg    Vital Signs - Last 8 Hours:   BP: (124-136)/(64-72)   Temp:  [97.9 F (36.6 C)-98.3 F (36.8 C)]   Pulse:  [57-79]   Resp:  [18]   SpO2:  [96 %-99 %]     Intake/Output last 24hours (7a-7a):   I/O 24 Hrs:  In: 840 [P.O.:840]  Out: -     Last 8 Hours:        DATA LAST 24 HOURS:     All data in last 24hours, labs  only:    Recent labs:    Basename 03/21/12 0700   WBC 5.6   HGB 11.0*   HCT 33.9*   PLTA 270   NEUT --   LYMPH --   MO --   EOS --   BASO --         Basename 03/21/12 0700   NA 139   K 3.7   CL 105   CO2 28   BUN 11  CREAT 0.7   GLUCOSER 78   CA 8.9   MG 2.1   PHOS 3.4   TBILI 0.9   DBILI 0.2   IBIL 0.7   AST 26   ALT 30   ALKPHOS 123*   ALBUMIN 3.5           Component Value Date   UACOL YELLOW 03/20/2012   UACOL YELLOW 03/20/2012   UACLA CLEAR 03/20/2012   UACLA CLEAR 03/20/2012   UAPRO NEGATIVE 03/20/2012   UAPRO TRACE 03/20/2012   UAGLU NEGATIVE 03/20/2012   UAGLU NEGATIVE 03/20/2012   UAKET NEGATIVE 03/20/2012   UAKET NEGATIVE 03/20/2012   UAOCC TRACE* 03/20/2012   UAOCC MODERATE* 03/20/2012   UANIT POSITIVE* 03/20/2012   UANIT POSITIVE* 03/20/2012   UABIL NEGATIVE 03/20/2012   UABIL NEGATIVE 03/20/2012   UAWBC RARE 0-2 03/20/2012   UARBC RARE 0-2 03/20/2012   UASQE >10* 03/20/2012   USTRA 3-10* 03/12/2006   UACRY NONE SEEN 03/20/2012   UABAC >50* 03/20/2012   UACAS NONE SEEN 03/20/2012         Imaging:   CXR 03/20/12:  A single AP portable view of the chest from March 20, 2012, 20: 49  shows a mildly enlarged heart size. The mediastinal contour is  grossly unremarkable. The lungs are clear. The costophrenic angles are  sharp.    IMPRESSION:    Mild cardiomegaly, some of which may be related to portable technique.    CT Chest with PE protocol 03/20/12:  Findings: Thyroid gland demonstrates slightly heterogeneous  enhancement. Calcifications are noted in the thyroid gland.  Benign-appearing axillary lymph nodes are present. Scattered  precarinal, pretracheal and subcarinal lymph nodes are noted however,  not enlarged by CT criteria. Aorta is normal in caliber. It is  measuring 3.5 cm in its AP dimension and 3.4 cm in its width. Vascular  calcifications and coronary artery calcifications are present. There  is cardiomegaly.    There is good contrast bolus in the pulmonary arteries. The study is  degraded due to respiratory motion and shallow inspiration. No  central  filling defects are noted suggestive of pulmonary emboli. Evaluation  of the distal branches are degraded due to respiratory motion. Subtle  filling defects cannot be excluded.    In the visualized upper abdomen, the spleen, adrenals, kidneys,  pancreas, gallbladder are grossly normal. There is mild fatty  infiltration of the liver . No adenopathy is seen. Small hiatal hernia  is present.    The tracheobronchial tree is normal. Small right-sided pleural  effusion is present. There is a moderately shallow inspiration. There  are a geographic groundglass opacities and mosaic attenuation in the  lower lobes which may be secondary to small airway disease or edema.  Clinical correlation is suggested. There is an nodule in the right  upper lobe measuring 6 mm, series 4 image 21. There is a small nodule  in the right upper lobe, series 4 image 15 measuring 3 mm . There is a  nodule in the left lower lobe, series 4 image 52 measuring 5 mm. A  small groundglass nodule in the left lower lobe measuring 4 mm, series  4 image 52. Some of the the nodules on the right are not fully  appreciated due to respiratory motion and shallow inspiration.  Remainder of the described nodules were in the prior study of  07/30/2006 and stable. No other intraparenchymal nodules or masses are  identified. No free fluid or abscess. Mild degenerative changes of  the  spine are present. No lytic or sclerotic bony lesions are identified.    Impression:  1. The study is degraded due to respiratory motion and shallow  inspiration. No filling defects are seen suggestive of central  pulmonary emboli. Subtle filling defect in the distal branches cannot  be excluded.  2. Multiple indeterminate pulmonary nodules as described above  however, stable compared to study of 07/30/2006. Follow-up as  clinically indicated.  3. Mosaic attenuation in the lungs which can be seen with small airway  disease or vascular congestion. Clinical correlation is  suggested.  4. Small right-sided pleural effusion.  5. Mild hepatic steatosis.  6. Other findings as described above.      Impression & Recommendations:   29F with hx of hyperparathyroid, lung nodules, tobacco abuse, Fatty liver, HTN, and positive PPD s/p 4 months of Rifamate in 2007, who p/w with pleuritic lower right sided chest pain which started acutely 4 days ago, found to have ground glass markings, pulmonary nodules, small right sided pleural effusion, and cardiomegaly on CT. DDx for a unifying diagnosis is difficult to obtain.  Pt had pulmonary nodules in the past that were read as possible granulomas and appear stable on current CT, which could be sign of granulomatous disease.  She also has cardiomegaly of indeterminate origin and a possible explanation for ground glass opacity and pleural effusion is pulmonar edema 2/2 to CHF, however there would be some expected symptoms like orthopnea, SOB, exercise intolerance etc. which are not present.  Other rheumatologic processes such as Lupus could explain pleuritic chest pain, diffuse pattern of lung disease, and some of the other symptoms found on general review such as intermittent rash on sun exposed areas and joint pain.  Sarcoidosis, ILD, and atypical interstitial pneumonias are lower on the differential. Hypersensitivity pneumonitis would not be expected to produce such a pattern of chest pain. Bronchoalveolar carcinoma and TB seem less likely without any constitutional symptoms or SOB.  Her chronic cough is consistent with a progressive lung process, but she does have some symptoms suggestive of other causes like GERD and seasonal allergies.  She overall appears well and does not seem to require hospitalization at the current time.  She has pain suggestive of inflammation and may respond well to NSAIDs in this acute period.  While a unifying diagnosis is hard to obtain, it is also possible her cardiomegaly and pulmonary findings represent two separate  processes.  She needs further workup for both her cardiomegaly and the lung findings however given her overall state of health this workup can proceed longitudinally.    Would recommend the following for now:  -TTE to characterize cardiomegaly further  -NSAIDs for acute pain  -ANA  -Pulmonary follow up in ~3 weeks with Dr. Karleen Dolphin  -Can be discharged home if feeling well/pain controlled        Jordi Kamm E. Dondrea Clendenin, DO, 03/21/2012, 4:51 PM       Pager [phone]

## 2012-03-21 NOTE — Progress Notes (Signed)
Family visiting most of shift.has denied c/o,s

## 2012-03-21 NOTE — Discharge Summary (Signed)
Physician Discharge Summary     Patient ID:  Mariah [name]  [id number]  56 year old  06-Apr-1956    Admit date: 03/21/2012  1:34 AM    Discharge date and time: 03/21/12     Admitting Physician: Augustina Mood     Discharge Physician: Tyson Babinski    Discharge Diagnoses:   Thoracic pain  Ground glass patter on pulmonary imaging with questionable congestion   Mild Cardiomegaly     Admission Condition: fair    Discharged Condition: stable    Indication for Admission: Ms. [name] 56 year old portuguese-speaking woman with PMH of hyperparathyroid, lung nodules, smoker, NAFLD, htn, who p/w with RUQ/R lower thoracic pain x4 days. Gradual onset and progressive pain. Now 7/10, woke her from sleep last night and she knew she had to come in. Worse with inspiration and movement, she has taken tylenol without relief. Denies leg swelling, hemoptysis, SOB. Not associated with eating, no n/v/d, no pale colored stools. Some constipation. Denies L sided CP associated, although once in a while (last time was 2 weeks ago), she has L sided CP at night, accompanied by sour taste, always in bed at night, and resolves with tylenol. As far as family hx, father had MI in 89s, maternal uncle died of MI in 33s, and sister died of stroke at 50. No personal or fam hx of blood clots. No orthopnea, PND, good exercise tolerance. No hx of lung disease, does not have a problem with wheezing. Does have a chronic cough, which she attributes to allergies - it is non-productive, only happens at night, and has been present for many years. Denies joint pain, rash, dry eyes/mouth. Denies fever, wt loss, has not been ill recently. 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 Had positive PPD in past.  in ED, vitals were: 98.1 F (36.7 C) 79 20 155/80 mmHg 100 % . RUQ [place] was negative and CT-PE did not show obvious PE but showed ground glass and ?congestion with possible mild cardiomegaly. She received  ASA, morphine, ativan, nitro, dilaudid    Hospital Course:   Ms. [name] medical problems were addressed as follows during her hospitalization:  # Ground glass markings and cephalization on CT. Pt had pulmonary nodules in the past that were read as possible granulomas. DDx considered included pulm edema 2/2 to [facility], ACS precipitating pulm edema sarcoidosis, ILD such as rheum causes, interstitial pneumonias. Also considering hypersensitivity pneumonitis, ABPA, bronchoalveolar carcinoma. Does not appear to be CHF with no signs of overload on exam and very low BNP at 22, however she had engorged pulmonary vessels on CT scan. The story is not typical for ACS (the duration, absence of SOB, location of pain), and she was successfully ruled out during her admission. Patient may have had exposures to chemical irritants as a result of her . Interstitial pna is possible given chronic cough and ground glass on CT. The fact that she has a chronic cough at night could be suggestive of a hypersensitivity pneumonitis (but more common causes such as GERD, asthma, PN drip are more likely to be causing it). ABPA is less likely without hx of asthma, she has allergies, and her chronic cough could be a cough variant asthma. Patient has a history of TB with positive positive PPD, s/p: 4 mo rifampin in 2007, and no evidence of active disease on imaging.  Pulmonology was consulted and were also unsure of a unifying diagnosis.  Other  rheumatologic processes such as Lupus was considered so appropriate tests were sent.  Sarcoidosis, ILD and atypical pneumonia was considered but lower on the differential considered.  Outpatient TTE and pulmonary follow up was recommended to further characterize patients condition.   # RUQ/lower right thoracic pain. DDx includes pleural based pain possibly secondary to effusion, small PE, referred cardiac pain, radicular pain, pre-zoster, could be a bony lesion (elevated alk phos, but nothing seen on CT), also  considered hepatitis (but LFTs are normal except alk phos), pancreatitis (but normal lipase), gastritis. Treated with morphine 4 Q3 PRN and pepcid 20 BID.  Effusion was too small to be  Tapped.  There was no evidence of loculation.    # HTN  Patient was continued on home cozaar    Consults: Pulmonary     Significant Diagnostic Studies:    Ref. Range 03/21/2012 07:00   WHITE BLOOD CELL Latest Range: 4.0-11.0 TH/uL 5.6   RED BLOOD CELL COUNT Latest Range: 3.90-5.20 M/uL 3.98   HEMOGLOBIN Latest Range: 11.2-15.7 g/dL 16.1 (L)   HEMATOCRIT Latest Range: 34.1-44.9 % 33.9 (L)   MEAN CORPUSCULAR HGB Latest Range: 26.0-34.0 pg 27.6   RBC DISTRIBUTION WIDTH STD Latest Range: 35.1-46.3 fL 45.3   MEAN CORP HGB CONC Latest Range: 31.0-37.0 g/dL 09.6   MEAN CORPUSCULAR VOL Latest Range: 80.0-100.0 fL 85.2   MEAN PLATELET VOLUME Latest Range: 8.7-12.5 fL 9.8   RBC DISTRIBUTION WIDTH Latest Range: 11.5-14.3 % 14.4 (H)   PLATELET COUNT Latest Range: 150-400 TH/uL 270   FERRITIN Latest Range: 11-307 ng/ml 92   FOLATE Latest Range: Low: 5.9 ng/mL 13.2   IRON Latest Range: 28-170 ug/dl 97   TOTAL IRON BIND CAPACITY CALC Latest Range: 282-427 ug/dL 045   SODIUM Latest Range: 135-144 mmol/L 139   POTASSIUM Latest Range: 3.5-5.1 mmol/L 3.7   CHLORIDE Latest Range: 101-111 mmol/L 105   CARBON DIOXIDE Latest Range: 22-32 mmol/L 28   ANION GAP Latest Range: 3-11 mmol/L 6   BLOOD UREA NITROGEN Latest Range: 6-20 mg/dl 11   CREATININE Latest Range: 0.4-1.2 mg/dl 0.7   ESTIMATED GLOMERULAR FILT RATE Latest Range: Low: > 60 ML/MIN > 60   Glucose Random Latest Range: 74-160 mg/dl 78   CALCIUM Latest Range: 8.6-10.3 mg/dl 8.9   MAGNESIUM Latest Range: 1.8-2.6 mg/dl 2.1   PHOSPHORUS Latest Range: 2.7-4.7 mg/dl 3.4   BILIRUBIN TOTAL Latest Range: 0.2-1.1 mg/dl 0.9   BILIRUBIN DIRECT Latest Range: 0.0-0.2 mg/dl 0.2   BILIRUBIN INDIRECT Latest Range: 0.2-0.9 mg/dl 0.7   ASPARTATE AMINOTRANSFERAS Latest Range: 8-34 IU/L 26   ALANINE AMINOTRANSFERASE  Latest Range: 7-35 IU/L 30   ALKALINE PHOSPHATASE Latest Range: 25-106 IU/L 123 (H)   TOTAL PROTEIN Latest Range: 5.9-7.5 g/dl 6.2   ALBUMIN Latest Range: 3.4-4.8 g/dl 3.5     CT-PE: per verbal signout from ED, radiology cannot rule out small PE due to motion artifact. Has ground glass and small R pleural effusion. Also cardiomegaly  RUQ ultrasound: negative  2007 CT: Multiple small pulm nodules likely c/w granuloma  Troponin: 0.02, 0.02, 0.01  Additional Diagnostic Testing:  PFTs September 2007 -- some obstruction at mid to low lung volumes not correctable by bronchodilator  Treatments:   Analgesia: Morphine  CV: Cozaar    Discharge Exam:  BP 136/72   Pulse 79   Temp(Src) 98.3 F (36.8 C) (Oral)   Resp 18   Ht 5' 3.39" (1.61 m)   Wt 201 lb 3.2 oz (91.264 kg)   BMI  35.21 kg/m2   SpO2 96%  General appearance: WD, WN in NAD  HEENT: NC/AT, PERRLA, EOMI, sclerae anicteric  Neck: Supple, no JVD, ?slight hepatojugular reflex  Lungs: CTAB, no w/r/r, normal WOB  Cardiac: RRR, normal S1,S2, no m/r/g  Abdomen: NABS, soft, mildly tender to deep palpation, negative Murphy's, no rebound tenderness/guarding, no HSM.  Extremities: WWP, no edema, 2+ pulses  LN: no cervical/supraclavicular/axillary/inguinal LAD  Skin: no rashes, lesions, scaling    Disposition: Home     Patient Instructions:   Discharge Medication List as of 03/21/12 07:05 PM    START taking these medications    benzonatate (TESSALON PERLES) 100 MG capsule  Take 1 capsule by mouth 3 (three) times daily as needed for Cough. Normal, 100 mg, Oral, 3 TIMES DAILY PRN, Disp-42 capsule, R-0    nicotine (NICODERM CQ) 14 MG/24HR  Place 1 patch onto the skin daily. Normal, 1 patch, Transdermal, EVERY 24 HOURS, Disp-30 patch, R-0      CONTINUE these medications which have CHANGED    ibuprofen (MOTRIN) 800 MG tablet  Take 1 tablet by mouth every 8 (eight) hours as needed for Pain. with food or milk Normal, 800 mg, Oral, EVERY 8 HOURS PRN, Disp-20 tablet, R-0      CONTINUE these  medications which have NOT CHANGED    losartan (COZAAR) 50 MG tablet  Take 50 mg by mouth daily. Historical, 50 mg, Oral, DAILY    PROTONIX 40 MG OR TBEC  1 tablet by mouth daily [facility], Oral, Disp-30, R-2    HYDROCHLOROTHIAZIDE 25 MG OR TABS  1 TABLET DAILY [facility], Oral, Disp-30, R-11    CLARITIN 10 MG OR TABS  1 TABLET BY MOUTH DAILY. [facility], Oral, Disp-30, R-5    PROAIR HFA 108 (90 BASE) MCG/ACT IN AERS  1 to 2 puffs every 4 to 6 hours as needed for cough. [facility], Inhalation, Disp-1, R-5    FLUTICASONE PROPIONATE  HFA 220 MCG/ACT IN AERO  1 puff every 12 hours  Pharmacy, Inhalation, Disp-1, R-5    VITAMIN D 400 UNIT OR CAPS  2 capsules daily Historical, Oral, Disp-30, R-0      STOP taking these medications    IRBESARTAN 150 MG OR TABS        Activity: activity as tolerated  Diet: cardiac diet  Wound Care: none needed     Future Appointments:  Current and Future Appointments at [facility] (90 Days)    Date Time Visit Type Department Provider Length    04/09/12 9:20 AM NEW 20 [facility] [[id number]] MORRISON, SARAH 20            Follow-up with:  Follow-up Information    None          Code Status during hospital stay: Full Code      Signed:  Vernie Shanks  03/21/2012  5:03 PM

## 2012-03-21 NOTE — Progress Notes (Signed)
Patient received awake, alert and oriented. Accompanied with son-in-law, patient deffered to have interpreter. Currently dozing but when she moves c/o's 7/10 pain right side ribs. Lungs decreased through out. HOB elevated to promote comfort.  Notified MD to evaluate patient. Tele applied, MP NSR to Sinus [place] Foley. Patient fast asleep once assessment completed. Son-in-law dozing in recliner in room, as well.

## 2012-03-21 NOTE — ED Notes (Signed)
No change in pt's pain, resting quietly with son awaiting admission.

## 2012-03-21 NOTE — ED Notes (Signed)
Pt reports pain the same after second NTG.

## 2012-03-21 NOTE — Discharge Instructions (Signed)
During my hospitalization, I was treated for the following conditions:   Chest Pain (due to chest wall muscle strain)    Description of Hospital Course:   Dear [name], you were admitted to the hospital because of your chest pain.  We were happy to see that the chemicals from your heart were not elevated and there was nothing from your hearts electrical activity that suggested you had a heart attack.  We did a scan of your lungs which showed some possible injury and a small amount of fluid which likely led to the discomfort that you initially experienced.  You were seen by the lung specialists and they will be seeing you within the next 1-2 weeks to follow up and do more tests on your lung function in the clinic.  It is very important that you stop smoking as this will improve your health incredibly.  We will also give you some medications to help your cough- which likely also contributed to your chest discomfort.     The goals my hospital doctors and treatment team have for my care are:   Eat a healthy diet   Stop smoking    Goals I have for getting healthier are:  Keep my appointment with my provider within 12 days of discharge.  I will stop smoking.    Please call your Primary Care Provider's (PCP) office [phone] if you have:   General: shaking chills and weakness  Cardiac: shortness of breath, fainting and palpitations  Pulmonary: difficulty breathing, cough, shortness of breath and wheezing  Any other concerns or questions or are having trouble getting medicines or appointments you need.    FOR EMERGENCIES CALL 911    Please discuss the following issues with your Primary Care Provider (PCP):  Your ANA test results  Your ECHO results     Diet: low fat and low sodium    Activity: As tolerated    Additional Instructions: No      Inpatient provider: Vernie Shanks, MD   Contact information: [phone]

## 2012-03-21 NOTE — ED Notes (Signed)
Pt reports pain "just a little bit different" after nitro.

## 2012-03-22 ENCOUNTER — Telehealth (HOSPITAL_BASED_OUTPATIENT_CLINIC_OR_DEPARTMENT_OTHER): Payer: Self-pay | Admitting: Internal Medicine

## 2012-03-22 LAB — URINE CULTURE/COLONY COUNT

## 2012-03-22 NOTE — Telephone Encounter (Signed)
Message copied by [place] Mon on Fri Mar 22, 2012  9:49 AM  ------       Message from: Lizabeth Leyden       Created: Fri Mar 22, 2012  9:38 AM       Regarding: This is a reminder for outreach discharge         This is a reminder for outreach discharge

## 2012-03-22 NOTE — Telephone Encounter (Signed)
TC to patient. LM requesting call back.

## 2012-03-25 ENCOUNTER — Encounter (HOSPITAL_BASED_OUTPATIENT_CLINIC_OR_DEPARTMENT_OTHER): Payer: Self-pay

## 2012-03-25 NOTE — Telephone Encounter (Signed)
Message copied by Jasmine Pang on Mon Mar 25, 2012  3:04 PM  ------       Message from: [place], AIDA       Created: Mon Mar 25, 2012  2:56 PM       Regarding: returning call        Contact: [phone]         [facility]              Mariah [name] [id number], 56 year old, female, Telephone Information:       Home Phone      [phone]       Work Phone      Not on file.       Mobile          [phone]                     Patient's Preferred Pharmacy:        [facility] ([facility]              Phone: [phone] Fax: [phone]                      CONFIRMED TODAY: Lovett Sox NUMBER: [phone]       Best time to call back:        Cell phone:        Other phone:              Available times:              Patient's language of care: [place]              Patient  need an portuguese  interpreter.              Patient's PCP: Cleora Fleet, MD              Person calling on behalf of patient: Patient (self)              Calls today Returning phonecall

## 2012-03-25 NOTE — Telephone Encounter (Signed)
Called pt with the port interpreter Dirce  Left message on voicemail and called mobile # and left message with pt's daughter to have pt call clinic and speak with nurse

## 2012-03-25 NOTE — Telephone Encounter (Signed)
Patient reporting cough still with some discomfort when coughing in the chest area. No SOB, wheezing.     Patient has completely stopped smoking "i want to live"    Patient has requisitions to get other tests. Encouraged her to get these done before apt with PCP. Reviewed signs and symptoms to go back to the ED for. Verbalized understanding. Gave reminders for upcoming apts.

## 2012-04-03 ENCOUNTER — Encounter (HOSPITAL_BASED_OUTPATIENT_CLINIC_OR_DEPARTMENT_OTHER): Payer: Self-pay | Admitting: Internal Medicine

## 2012-04-03 ENCOUNTER — Ambulatory Visit (HOSPITAL_BASED_OUTPATIENT_CLINIC_OR_DEPARTMENT_OTHER): Payer: PRIVATE HEALTH INSURANCE | Admitting: Internal Medicine

## 2012-04-03 VITALS — BP 138/88 | Ht 64.5 in | Wt 198.0 lb

## 2012-04-03 DIAGNOSIS — R5383 Other fatigue: Secondary | ICD-10-CM

## 2012-04-03 DIAGNOSIS — I119 Hypertensive heart disease without heart failure: Secondary | ICD-10-CM

## 2012-04-03 DIAGNOSIS — D649 Anemia, unspecified: Secondary | ICD-10-CM

## 2012-04-03 DIAGNOSIS — R12 Heartburn: Secondary | ICD-10-CM

## 2012-04-03 DIAGNOSIS — I517 Cardiomegaly: Secondary | ICD-10-CM

## 2012-04-03 DIAGNOSIS — E669 Obesity, unspecified: Secondary | ICD-10-CM

## 2012-04-03 LAB — ANTINUCLEAR ANTIBODY: ANTINUCLEAR ANTIBODY: NEGATIVE

## 2012-04-03 LAB — CHG LIPID PANEL
Cholesterol: 212 mg/dl — ABNORMAL HIGH (ref 0–200)
HIGH DENSITY LIPOPROTEIN: 61 mg/dl (ref 35–85)
LOW DENSITY LIPOPROTEIN DIRECT: 134 mg/dl — ABNORMAL HIGH (ref 0–100)
RISK FACTOR: 3.5 (ref ?–4.4)
TRIGLYCERIDES: 67 mg/dl (ref 0–150)

## 2012-04-03 LAB — CHG HEPATIC FUNCTION PANEL
ALANINE AMINOTRANSFERASE: 30 IU/L (ref 7–35)
ALBUMIN: 3.5 g/dl (ref 3.4–4.8)
ALKALINE PHOSPHATASE: 123 IU/L — ABNORMAL HIGH (ref 25–106)
ASPARTATE AMINOTRANSFERASE: 26 IU/L (ref 8–34)
BILIRUBIN DIRECT: 0.2 mg/dl (ref 0.0–0.2)
BILIRUBIN TOTAL: 0.9 mg/dl (ref 0.2–1.1)
INDIRECT BILIRUBIN: 0.7 mg/dl (ref 0.2–0.9)
TOTAL PROTEIN: 6.2 g/dl (ref 5.9–7.5)

## 2012-04-03 LAB — BASIC METABOLIC PANEL
ANION GAP: 6 mmol/L (ref 3–11)
BUN (UREA NITROGEN): 11 mg/dl (ref 6–20)
CALCIUM: 8.9 mg/dl (ref 8.6–10.3)
CARBON DIOXIDE: 28 mmol/L (ref 22–32)
CHLORIDE: 105 mmol/L (ref 101–111)
CREATININE: 0.7 mg/dl (ref 0.4–1.2)
ESTIMATED GLOMERULAR FILT RATE: >60 mL/min (ref >60)
Glucose Random: 78 mg/dl (ref 74–160)
POTASSIUM: 3.7 mmol/L (ref 3.5–5.1)
SODIUM: 139 mmol/L (ref 135–144)

## 2012-04-03 LAB — CHG ASSAY OF PHOSPHORUS INORGANIC: PHOSPHORUS: 3.4 mg/dl (ref 2.7–4.7)

## 2012-04-03 LAB — CHG ASSAY OF FERRITIN: FERRITIN: 92 ng/ml (ref 11–307)

## 2012-04-03 LAB — CHG ASSAY OF MAGNESIUM: MAGNESIUM: 2.1 mg/dl (ref 1.8–2.6)

## 2012-04-03 LAB — IRON: IRON: 97 ug/dl (ref 28–170)

## 2012-04-03 LAB — TSH (THYROID STIMULATING HORMONE): TSH (THYROID STIM HORMONE): 1.68 u[IU]/mL (ref 0.34–5.60)

## 2012-04-03 LAB — CHG ASSAY OF FOLIC ACID SERUM: FOLATE: 13.2 ng/mL (ref 5.9–?)

## 2012-04-03 LAB — VITAMIN B12: VITAMIN B12: 419 pg/mL (ref 180–914)

## 2012-04-03 LAB — CHG IRON BINDING CAPACITY: TOTAL IRON BIND CAPACITY CALC: 375 ug/dL (ref 282–427)

## 2012-04-03 MED ORDER — HYDROCHLOROTHIAZIDE 25 MG PO TABS
25.0000 mg | ORAL_TABLET | Freq: Every day | ORAL | Status: DC
Start: 2012-04-03 — End: 2012-07-12

## 2012-04-03 MED ORDER — OMEPRAZOLE 40 MG PO CPDR
40.00 mg | DELAYED_RELEASE_CAPSULE | Freq: Every day | ORAL | Status: AC
Start: 2012-04-03 — End: 2012-05-03

## 2012-04-03 MED ORDER — LOSARTAN POTASSIUM 50 MG PO TABS
100.0000 mg | ORAL_TABLET | Freq: Every day | ORAL | Status: DC
Start: 2012-04-03 — End: 2012-04-23

## 2012-04-03 NOTE — Progress Notes (Signed)
SUBJECTIVE  Mariah [name] is a 56 year old female new to this clinic    She was last seen at [facility]) about 4 years ago.  Spent 3 years in [place], recently moved back 4 months ago    This is a follow up after hospital admission for RLQ pain and episode of chest pain. She brings the discharge notes she was given at the hospital  She was admitted for observation and r/o cardiac problems.     -- She had a negative abdomen ultrasound    -- She had a negative Chest xray that showed            Mild cardiomegaly, some of which may be related to portable technique.   -- She had a chest CT to r/o PE that showed   2. Multiple indeterminate pulmonary nodules as described above. However, stable compared to study of 07/30/2006. Follow-up as   clinically indicated.   3. Mosaic attenuation in the lungs which can be seen with small airway disease or vascular congestion. Clinical correlation is suggested.   4. Small right-sided pleural effusion.   5. Mild hepatic steatosis.   -- She was evaluated by pulmonology and has an appt with them and has a FU appt on 5/13      Today she reports no more abdominal symptoms. She reports no chest pain, no SOB, no DOE, no dizziness. She reports a lingering cough that is improving after she started taking OTC Allegra. She remembers having seasonal allergies when she was living here. Pt reports to smoke 3-5 cig a day, but has not smoked since these past events. Not interested in starting to smoke again. Husband still smokes, but not inside the house. No other smokers    She reports one episode of chest pain while in [place], about 8 months ago. Reports it felt like a pressure, lasted a few minutes, associated with tingling of left arm. Did not go to the ED, went to sleep, next day the symptoms have resolved.  No follow up.    Reports GERD, taking omeprazole, 40 mg, at bed time, for many years    Brings list of HTN medications prescribed in [place] (Cozar,  hydrochlorothiazide). Wants to have them renewed.     Mood - with little interest in doing things, low energy, feeling this way even before coming to the [place]  Reports problems with weight, "all my life". Significant family history of obesity and diabetes (mother, aunts and uncles, daughter with obesity)  Over the years, has taken diet pills without lasting good results. Tends to lose weight, then gains it back. Poor body image.  Never been on an exercise program, but wants to sign up for the gym, "I need to lose weight"  Never seen a nutritionist    Easiness to put on weight, family history of obesity and diabetes  Sometimes takes medication to lose weight from [place], usually she manages weight this way  Signed up to go to the gym    OBJECTIVE:  BP 138/88   Ht 5' 4.5" (1.638 m)   Wt 198 lb (89.812 kg)   BMI 33.46 kg/m2  Pleasant, well nourished, obese, in NAD, [place] speaker     Sinus: non tender  Nose: congested, mucosa erythematous with some exudate.  Ears: TM pearly gray, mild erythema on right TM. No perforation, no lesions in the ear canal.    Oral cavity: oral mucosa moist and pink. Throat and pharynx are  erythematous. Tonsils 1+, uvula raises midline  The chest is clear, without wheezes or rales.  Heart: S1 and S2, no gallops, murmurs, regular rate and rhythm   Abdomen: soft, non distended, obese, bs in 4 quadrants, non tender, no guarding, no organomegaly  Psych: alert and oriented x 3, linear thoughts, she is not confused, She has many questions about her health and appears motivated to make changes    ASSESSMENT & PLAN:  402.10 BENIGN HYP HRT DIS W/O HRT FAIL  (primary encounter diagnosis)  Comment: Will refill medications. Will refer to stress test given HTN, episode of chest pain described above while in [place].   Plan: losartan (COZAAR) 50 MG tablet,         hydrochlorothiazide (HYDRODIURIL) 25 MG tablet,        REFERRAL TO CARDIO-PULMONARY LAB ( INT)            780.79BA Decreased energy  Comment:  Will r/o anemia, hypothyroidism. Recent CMP normal, no glucose intolerance. Chart review lists depression in 2009. Will reassess if blood tests are normal   Plan: THYROID STIM HORMONE            278.00 OBESITY NOS  Comment: Lengthy discussion on life style, dieting, exercise. Encouraged to pick one enjoyable activity, pursue it. Pt identifies dancing and playing with grandchildren. Encouraged to sign up for dance classes, start with short daily walks, build on endurance  Plan: COLLECTION VENOUS BLOOD VENIPUNCTURE, LIPID         PANEL            285.9 Anemia, unspecified  Comment: Will do iron studies to CBC drawn while in the hospital, low Hct  Plan: ORDER FOR MAMMOGRAM (SCREENING), LAB ADD         ON/WRITE IN TEST            429.3 Cardiomegaly  Comment: As identified in chest xray. Will do echocardiogram  Plan: REFERRAL TO CARDIO-PULMONARY LAB ( INT)            787.1 Heartburn  Comment: Pt requests refill of medication  Plan: omeprazole (PRILOSEC) 40 MG capsule          45 minutes were spent with patient face-to-face with greater than 50% of time spent counseling regarding the above discussed issues      follow-up will be scheduled in 3 weeks to the issues above   -- assess exercise motivation   -- referral to nutrition   -- osteoporosis assessment (in problem list)   -- abnormal liver tests (in problem list, 2007)    She has been advised to call or return with any worsening or new problems

## 2012-04-03 NOTE — Telephone Encounter (Signed)
Pt came in for appt at clinic today with Mariah [name].  Telephone encounter closed.

## 2012-04-09 ENCOUNTER — Ambulatory Visit (HOSPITAL_BASED_OUTPATIENT_CLINIC_OR_DEPARTMENT_OTHER): Payer: Self-pay

## 2012-04-15 NOTE — Discharge Summary (Signed)
Agree with excellent PGY-1 note above.   Greater than 30 minutes spent on this discharge process

## 2012-04-16 ENCOUNTER — Ambulatory Visit (HOSPITAL_BASED_OUTPATIENT_CLINIC_OR_DEPARTMENT_OTHER): Payer: Self-pay | Admitting: Internal Medicine

## 2012-04-16 ENCOUNTER — Telehealth (HOSPITAL_BASED_OUTPATIENT_CLINIC_OR_DEPARTMENT_OTHER): Payer: Self-pay | Admitting: Internal Medicine

## 2012-04-16 DIAGNOSIS — I517 Cardiomegaly: Secondary | ICD-10-CM

## 2012-04-16 DIAGNOSIS — R079 Chest pain, unspecified: Secondary | ICD-10-CM

## 2012-04-16 LAB — ECHOCARDIOGRAM W/ DOPPLER

## 2012-04-16 NOTE — Telephone Encounter (Signed)
Called pt to discuss normal Stress test. Spoke to pt, All questions answered.  She has FU appt with me next week

## 2012-04-16 NOTE — Consults (Signed)
Pt examined and interviewed together with Dr. Jacalyn Lefevre. Agree with assessment.

## 2012-04-17 LAB — EKG-12 ** TO BE DONE BY EKG **

## 2012-04-19 LAB — CARDIAC STRESS TEST: EXERCISE/TREADMILL

## 2012-04-23 ENCOUNTER — Ambulatory Visit (HOSPITAL_BASED_OUTPATIENT_CLINIC_OR_DEPARTMENT_OTHER): Payer: PRIVATE HEALTH INSURANCE | Admitting: Internal Medicine

## 2012-04-23 VITALS — BP 170/92 | Ht 64.5 in | Wt 205.0 lb

## 2012-04-23 DIAGNOSIS — Z1211 Encounter for screening for malignant neoplasm of colon: Secondary | ICD-10-CM

## 2012-04-23 DIAGNOSIS — I119 Hypertensive heart disease without heart failure: Secondary | ICD-10-CM

## 2012-04-23 DIAGNOSIS — E669 Obesity, unspecified: Secondary | ICD-10-CM

## 2012-04-23 MED ORDER — LOSARTAN POTASSIUM 50 MG PO TABS
100.0000 mg | ORAL_TABLET | Freq: Every day | ORAL | Status: DC
Start: 2012-04-23 — End: 2012-07-12

## 2012-04-23 NOTE — Progress Notes (Signed)
SUBJECTIVE  Mariah [name] is a 56 year old female here for follow up from visit on 4/17. She comes with her 98 yo grandson    1) HTN  Has not taken medication today and yesterday, but other than that, reports taking it every day  Denies chest pain, palpitations, head aches, dizziness      -- had echocardiogram and stress test, both normal.     2) Mammogram  Request placed on 4/17, pt reports she did not receive call with appointment    3) Obesity  Continues being a concern, feels she's gaining weight  Signed up to zumba classes, will start today, feels motivated to it  Interested in talking to the nutritionist, especially in light of slightly elevated LDL (134)    4) colonoscopy  Never had it done. No family history of colon cancer    OBJECTIVE:  BP 170/92   Ht 5' 4.5" (1.638 m)   Wt 205 lb (92.987 kg)   BMI 34.64 kg/m2  Pleasant, obese, [place] speaker, in NAD, with grandson who interrupts visit several times because he's hungry and wants to go    Heart: S1 and S2 normal, no murmurs, clicks, gallops or rubs. Regular rate and rhythm.   Lungs:  clear; no wheezes, rhonchi or rales.    ASSESSMENT & PLAN:  402.10 BENIGN HYP HRT DIS W/O HRT FAIL  (primary encounter diagnosis)  Comment: Mildly elevated today, but pt reports not taking meds x 2 days. Encouraged resuming. Pt requests refill for losartan, that she was started in [place].   Plan: losartan (COZAAR) 50 MG tablet            278.00 Obesity, unspecified  Comment: Encouragement and support for behavior change, exercise, portion control  Plan: REFERRAL TO NUTRITION ( INT) NON-DIABETES            V76.2F Screening for colon cancer  Comment: Pt has schedule conflict to have colonoscopy, prefers Saturday appointments if possible. I will find out if Saturdays are an option. In the meantime, agrees to do guaiac cards at home.  Plan: FECAL OCCULT (POINT OF CARE) HOME TEST 3 CARDS            follow-up scheduled for June  she has been advised to call or return  with any worsening or new problems    Next visit, address  -- osteoporosis assessment (in problem list)   -- abnormal liver tests (in problem list, 2007)  -- need for pap? Hx of hysterectomy -- review guidelines

## 2012-04-29 ENCOUNTER — Ambulatory Visit: Payer: PRIVATE HEALTH INSURANCE | Admitting: Pulmonary Disease

## 2012-04-29 VITALS — BP 176/88 | HR 56 | Resp 16 | Wt 202.0 lb

## 2012-04-29 DIAGNOSIS — J849 Interstitial pulmonary disease, unspecified: Secondary | ICD-10-CM

## 2012-04-29 NOTE — Progress Notes (Signed)
PCP NP Florestine Avers; [address] in [place]; pt interviewed with portuguese interpreter     CC: Here for hospital f/u.  Discharged 03/21/12  HPI: Had presented with pleuritic chest pain; r/o for PE but CT scan showed ground glass opacities/mosaic attenuation, a small right pleural effusion, and cardiomegaly. Differential felt to include CHF, viral illness, rheumatologic condition (esp with hx of joint pains and sun-exposure rash on chest).  Discharged on motrin 800 mg tid and with plans for outpt f/u.  Had ECHO on f/u which was essentially normal.    Now feeling well, denies sx's.  Left the hospital feeling well.  Took motrin 800 mg at hospital d/c for about a week which seemed to resolve the pleuritic chest pain. Also felt the tessalon perles were "very good".  Now denies any chest discomfort, pleuritic or otherwise.  Has minimal cough these days which she attributes to allergies. +Itchy throat, eyes, nose and nasal congestion; taking allegra daily, which is helping.      Takes ibuprofen OTC 2 tabs prn for jt pain, generally in am's.    ROS:  -dyspnea with exertion; able to do biking and treadmill at the gym;   Before taking antihistamines felt her throat would close, but no chest tightness.  No nocturnal symptoms of shortness of breath or cough. No orthopnea or PND.   no new rash; has a lot of joint pains of PIP's, feet, heel, "whole body"  All others negative    PE:  Wdwn overweight female in no acute distress  BP 176/88   Pulse 56   Resp 16   Wt 202 lb (91.627 kg)   BMI 34.15 kg/m2   SpO2 99%  HEENT anicteric sclerae; oropharynx without erythema or exudate; malampati 3  Neck: no adenopathy; no thyromegaly  Heart: regular S1S2, no S3S4 or murmurs  Lungs: no accessory muscle use; no rales or wheezes;good breath sounds; no dullness to percussion  Extremities: no clubbing, cyanosis or edema  Musculoskeletal: normal gait; no obvious joint deformities    Labs:  ANA negative   No RF recorded         ECHO  04/16/12  1. LV size and wall thicknesses are normal.  2. Left ventricular ejection fraction is calculated by biplane Simpson's   method at 57%. 3. There are no regional wall motion abnormalities.  4. Doppler indices do not show any significant evidence of diastolic   dysfunction. 5. Mild mitral regurgitation is present.  6. There is mild tricuspid regurgitation. Estimated right ventricular   systolic pressure is 25 mmHg , which is normal.    Imp:  S/p pleuritic chest pain, right-sided, associated with right-sided pleural effusion and ground glass opacities on chest CT. Sx's completely resolved, ? Sx's improved with NSAID's. Need to consider rheumatologic condition with pleurisy and interstitial lung disease.  Pt denies respiratory sx's now. Could also have been a viral illness with viral pneumonia.    Cardiomegaly but normal ECHO on f/u 3+ weeks later- suggests that even if prior sx's were due to cardiac dysfunction, there is no evidence of that now.    Cough, mild, intermittent; likely due to allergies (prior cough had resolved)    Plan:  Will repeat chest CT with high-resolution scans at the 2 month mark  If abnormal, would do PFT's and further rheum w/u, consider lung bx  Pt to call if any recurrence of sx's or new sx's  F/u with PCP re: arthralgias  Cont treating allergies/cough with antihistamine; f/u  for further evaluation if not improved with this

## 2012-04-30 ENCOUNTER — Encounter (HOSPITAL_BASED_OUTPATIENT_CLINIC_OR_DEPARTMENT_OTHER): Payer: Self-pay | Admitting: Internal Medicine

## 2012-05-09 ENCOUNTER — Ambulatory Visit: Payer: Self-pay | Admitting: Pulmonary Disease

## 2012-05-20 ENCOUNTER — Ambulatory Visit (HOSPITAL_BASED_OUTPATIENT_CLINIC_OR_DEPARTMENT_OTHER): Payer: Self-pay | Admitting: Pulmonary Disease

## 2012-05-20 LAB — CT CHEST WO CONTRAST

## 2012-05-21 ENCOUNTER — Encounter (HOSPITAL_BASED_OUTPATIENT_CLINIC_OR_DEPARTMENT_OTHER): Payer: Self-pay | Admitting: Internal Medicine

## 2012-05-21 ENCOUNTER — Ambulatory Visit (HOSPITAL_BASED_OUTPATIENT_CLINIC_OR_DEPARTMENT_OTHER): Payer: Self-pay | Admitting: Internal Medicine

## 2012-05-21 ENCOUNTER — Ambulatory Visit (HOSPITAL_BASED_OUTPATIENT_CLINIC_OR_DEPARTMENT_OTHER): Payer: PRIVATE HEALTH INSURANCE | Admitting: Internal Medicine

## 2012-05-28 LAB — MA SCREENING MAMMO BILATERAL WITH CAD

## 2012-06-10 ENCOUNTER — Ambulatory Visit (HOSPITAL_BASED_OUTPATIENT_CLINIC_OR_DEPARTMENT_OTHER): Payer: Self-pay | Admitting: Internal Medicine

## 2012-06-10 LAB — US BREAST-AXILLA LEFT

## 2012-06-10 LAB — MA DIAGNOSTIC MAMMO UNILATERAL LEFT WITH CAD

## 2012-06-18 ENCOUNTER — Encounter (HOSPITAL_BASED_OUTPATIENT_CLINIC_OR_DEPARTMENT_OTHER): Payer: Self-pay | Admitting: Internal Medicine

## 2012-06-18 DIAGNOSIS — R922 Inconclusive mammogram: Secondary | ICD-10-CM | POA: Insufficient documentation

## 2012-06-18 HISTORY — DX: Inconclusive mammogram: R92.2

## 2012-06-25 ENCOUNTER — Ambulatory Visit (HOSPITAL_BASED_OUTPATIENT_CLINIC_OR_DEPARTMENT_OTHER): Payer: PRIVATE HEALTH INSURANCE | Admitting: Internal Medicine

## 2012-06-25 ENCOUNTER — Encounter (HOSPITAL_BASED_OUTPATIENT_CLINIC_OR_DEPARTMENT_OTHER): Payer: Self-pay | Admitting: Internal Medicine

## 2012-06-25 VITALS — BP 140/88 | Temp 98.4°F | Ht 64.0 in | Wt 209.0 lb

## 2012-06-25 DIAGNOSIS — R109 Unspecified abdominal pain: Secondary | ICD-10-CM

## 2012-06-25 DIAGNOSIS — R5383 Other fatigue: Secondary | ICD-10-CM

## 2012-06-25 DIAGNOSIS — Z711 Person with feared health complaint in whom no diagnosis is made: Secondary | ICD-10-CM

## 2012-06-25 DIAGNOSIS — I119 Hypertensive heart disease without heart failure: Secondary | ICD-10-CM

## 2012-06-25 DIAGNOSIS — L659 Nonscarring hair loss, unspecified: Secondary | ICD-10-CM

## 2012-06-25 DIAGNOSIS — R5381 Other malaise: Secondary | ICD-10-CM

## 2012-06-25 NOTE — Progress Notes (Signed)
SUBJECTIVE  Mariah [name] is a 56 year old female here for FU on the following issues:    1) HTN  Taking Losartan 50mg  daily, twice a day. Reports that she did not fill Rx I gave her last time, because she had enough medication that she brought from [place] when she came in December last year. Will call to request refill when she runs out of the medication she brought.  Reports no head aches, no cp, no SOB, no DOE, no leg edema    2) Stomach pain   She reports a long hx of heart burn, evaluated by Dr Lajean Saver in 2007, Had "upper endoscopy showed evidence of a hiatus   hernia, esophagitis, gastritis, and duodenitis. A biopsy of the descending duodenum to rule out celiac sprue because of the diarrhea was negative. A biopsy of the antrum to rule out H. pylori was also negative"  She reports taking Omeparzole 40mg  daily, but lately has been taking it twice a day given increased sx of pain. This has improved her sx. She has been taking Omeprazole that she brought from [place], and did not feel the Rx I gave her in April. She denies use of Etoh, since this worsens her sx. Has one coffee a day. Takes NSAIDs very occasionally for head aches, does not feel it worsens the pain.    3) Generalized aches and pains  Reports that these are worse in the morning, mildly improved during the day. She's had this complaint for several months. CBC in April showed minimal decrease  Of Hb and Hct with normal iron studies, B12, folate levels. TSH was normal.   Pt reports no physical activity, difficulty controlling weight, chart review indicates gain of 7 lbs since her last visit in May. She reports that previously she took pills to lose weight, although she states that she has not been taking them for several months. She reports strong family hx of obesity.     4) Hair loss, and brittle nails, this has been going on over the past 5 years, on and off.   In [place] took a medication that was very helpful, but doesn't recall the  name.  Since returning to the [place], in December 2012, the problems returned.    5) Skin  Has noticed new moles and lesions in skin. Concerned about this, since there's family history of skin cancer.   Particularly concerned about lesion on left side of chest, under clavicle, that she noticed recently. Also one lesion on left leg that pt reports having for some months, but feels it's getting larger.     6) Mammogram  Results reviewed, pt to FU in 6 months    7) Lung nodule  Results from CT scan reviewed, note from Dr Dorette Grate reviewed, no further FU unless pt develops sx      OBJECTIVE:  BP 140/88   Temp(Src) 98.4 F (36.9 C) (Oral)   Ht 5\' 4"  (1.626 m)   Wt 209 lb (94.802 kg)   BMI 35.86 kg/m2  Pleasant, well developed, well nourished, [place] speaker, in NAD    Heart: S1 and S2 normal, no murmurs, clicks, gallops or rubs. Regular rate and rhythm.   Lungs:  clear; no wheezes, rhonchi or rales.  Abdomen: soft, bs in 4 quadrants, not distended, no guarding, no masses,no organomegaly, no suprapubic tenderness. There's epigastric tenderness  Skin: Approx 1 cm vesicle-appearing lump on upper left chest wall, no erythema, non tender, not indurated, soft, well demarcated borders.  Approx 1 cm in diameter, round, hyperpigmented, flat, well defined borders, non tender, indurated lesion on right calf. No erythema.  Hair: dyed, appears healthy, no infestations. There's clear thinning at the top of the head, with no growth of new hair  Phq - 9 = 7    ASSESSMENT & PLAN:  (402.10) Benign hypertensive heart disease without heart failure  (primary encounter diagnosis)  Comment: Borderline, but improved from previous two visits. Reviewed medication adherence, sodium intake, weight management, low sodium diet, target organ damage, importance of optimum control. Requested pt to return for BP checks, but she states that she cannot come more often d/t work schedule.   Plan: Continue with current regimen. Pt will call when she runs out of  the medications she brought from [place].    (536.8) Gastric pain  Comment: Reviewed Dr Lajean Saver note, and further discussed what a hiatal hernia is, and what it implies. Reviewed avoiding laying down after eating, avoid alcohol, caffeine, NSAID, and other irritants, raise head of bed.   Unclear about how long she should be taking Omeprazole, or if she would need another upper endoscopy to evaluate esophagitis. Will refer.  Plan: REFERRAL TO GASTROENTEROLOGY ( INT)           (780.79) Other malaise and fatigue  Comment: Reviewed recent labs. Discussed sleep hygiene, diet. Encouraged exercise, exercise, exercise, to keep active, this will improve her overall conditioning, help with weight, improve her mood  Plan:     (704.00) Patchy loss of hair  Comment: Unclear etiology. I'll find out more about possible causes, will then call her with better plan. She does have one aunt who went bald, ? Hereditary. Discussed avoiding harsh chemicals to hair, proper nutrition  Plan:     (V65.5) Concern about skin disease without diagnosis  Comment: Unclear etiology for pt's particular concerns, will refer to [facility] for second opinion  Plan:       follow-up will be scheduled for 1 month  she has been advised to call or return with any worsening or new problems

## 2012-07-12 ENCOUNTER — Other Ambulatory Visit (HOSPITAL_BASED_OUTPATIENT_CLINIC_OR_DEPARTMENT_OTHER): Payer: Self-pay | Admitting: Internal Medicine

## 2012-07-12 DIAGNOSIS — I119 Hypertensive heart disease without heart failure: Secondary | ICD-10-CM

## 2012-07-12 MED ORDER — LOSARTAN POTASSIUM 50 MG PO TABS
100.0000 mg | ORAL_TABLET | Freq: Every day | ORAL | Status: DC
Start: 2012-07-12 — End: 2012-08-07

## 2012-07-12 MED ORDER — HYDROCHLOROTHIAZIDE 25 MG PO TABS
25.0000 mg | ORAL_TABLET | Freq: Every day | ORAL | Status: DC
Start: 2012-07-12 — End: 2012-08-07

## 2012-07-12 NOTE — Progress Notes (Signed)
PER Patient (self),     Mariah [name], a 56 year old female has requested a refill of losartan 50mg  and hydrochlorothiazide 25mg .    Last Office Visit: 06/25/2012  Last Physical Exam: 03/08/2005    HTN Med:    Most Recent BP Reading(s)  06/25/12 : 140/88  04/29/12 : 176/88  04/23/12 : 170/92    Documented patient preferred pharmacies:    [facility] ([facility]  Phone: [phone] Fax: [phone]

## 2012-07-12 NOTE — Progress Notes (Addendum)
[facility]    Person calling on behalf of patient: Patient (self)    May list multiple medications in this section    Medicine Name: losartan (COZAAR)   :   hydrochlorothiazide (HYDRODIURIL)          Dosage: 50 MG tablet                         :  25 MG tablet    Frequency (how many pills, how many times a day): Take 2 tablets by mouth daily : Take 1 tablet by mouth daily         Number of pills left: N/A                    :               N/A    Documented patient preferred pharmacies:    [facility]  Phone: [phone] Fax: [phone]      Pharmacy Name: SAME AS ABOVE    Pharmacy Telephone Number:     Pharmacy  Fax Number:     [name] NUMBER: [phone]      Cell phone:      Other phone:     Available times:     Patient's language of care: [place]    Patient needs a [place] interpreter.

## 2012-07-18 ENCOUNTER — Encounter (HOSPITAL_BASED_OUTPATIENT_CLINIC_OR_DEPARTMENT_OTHER): Payer: Self-pay

## 2012-07-22 ENCOUNTER — Encounter: Payer: Self-pay | Admitting: Family

## 2012-08-01 ENCOUNTER — Other Ambulatory Visit (HOSPITAL_BASED_OUTPATIENT_CLINIC_OR_DEPARTMENT_OTHER): Payer: Self-pay | Admitting: Internal Medicine

## 2012-08-01 NOTE — Telephone Encounter (Signed)
Outreach phone call made. Interpreter used to call patient and appt has been scheduled on Thursday, September 19,2013 @ 1:20PM (PE and PAP)  Reminder letter mailed

## 2012-08-07 ENCOUNTER — Encounter (HOSPITAL_BASED_OUTPATIENT_CLINIC_OR_DEPARTMENT_OTHER): Payer: Self-pay | Admitting: Family Medicine

## 2012-08-07 ENCOUNTER — Ambulatory Visit (HOSPITAL_BASED_OUTPATIENT_CLINIC_OR_DEPARTMENT_OTHER): Payer: PRIVATE HEALTH INSURANCE | Admitting: Family Medicine

## 2012-08-07 VITALS — BP 158/82 | HR 67 | Temp 98.1°F | Ht 64.0 in

## 2012-08-07 DIAGNOSIS — X32XXXA Exposure to sunlight, initial encounter: Secondary | ICD-10-CM

## 2012-08-07 DIAGNOSIS — D2239 Melanocytic nevi of other parts of face: Secondary | ICD-10-CM

## 2012-08-07 DIAGNOSIS — I119 Hypertensive heart disease without heart failure: Secondary | ICD-10-CM

## 2012-08-07 MED ORDER — LOSARTAN POTASSIUM 50 MG PO TABS
100.0000 mg | ORAL_TABLET | Freq: Every day | ORAL | Status: DC
Start: 2012-08-07 — End: 2012-09-12

## 2012-08-07 MED ORDER — HYDROCHLOROTHIAZIDE 25 MG PO TABS
25.0000 mg | ORAL_TABLET | Freq: Every day | ORAL | Status: DC
Start: 2012-08-07 — End: 2013-03-18

## 2012-08-07 NOTE — Progress Notes (Signed)
Note: I reviewed patient's chart and this is patient's first visit with Family Medicine    Given language barrier, a telephone/live interpreter was used during this visit. All questions were answered and patient felt comfortable with discharge plan.      SUBJECTIVE  56 y/o F referred to me by PCP Florestine Avers  for several skin issues. Her sister has had skin cancer but this doesn't sound like melanoma.  And her brother had a lesion on his ear and needed half of his ear removed. She would like a mole on her chin removed as it bothers her. + sun exposure   1) lesion on her face and arm which are sticking out that she is concerned     She agrees to a full body skin check today     BP 158/82   Pulse 67   Temp(Src) 98.1 F (36.7 C) (Oral)   Ht 5\' 4"  (1.626 m)   SpO2 100%  Physical Exam  GENERAL:  well appearing, NAD  NEURO: Oriented to time, place and person  MOOD/AFFECT: appropriate  SKIN: Full body skin check done today. This includes: head (face and scalp), neck, chest, abdomen, back, right upper extremity, left upper extremity, right lower extremity and left lower extremity.   Pertinent positives including rashes, suspicious lesions, ulcers and photo damage:   1) + moderate sun damage  2) firm compound nevus (benign appearing) 3mm size on chin      Informed consent was obtained and documented including option of not performing surgery, technique of surgery, potential for scarring, risk of infection, and allergic reaction to anesthetic.      PATIENT/PROCEDURE VERIFICATION DOCUMENTATION    Correct patient: Yes  Correct procedure: Yes  Correct site, mark visible if applicable: Yes  Correct position: Yes  Special equipment/implant(s) present, if applicable: N/A    Time-out completed, documented by provider doing procedure or designated team member:  Vic Ripper, MD     08/07/2012     4:09 PM         ASSESSMENT/PLAN:  1) benign appearing compound nevus of chin  After site was prepped in sterile fashion,  using Betadine for cleansing and 0.5cc  1% Lidocaine with epinephrine for anesthetic, with sterile technique, shave excision was performed. (0.4cm size wound)    Wound was dressed with antibiotic ointment.  Wound care instructions were provided and the patient was instructed to be alert for any signs of cutaneous infection.  Follow up: the patient may return prn.    2) HTN: has been out of meds, requests refill    3) RHM:   --needs full PE and pap and colon screening. She will set this up with her PCP     POST PAIN ASSESSMENT:  Post pain assessment done. Patient rates pain as a 0 on a 0-10 pain scale.

## 2012-08-23 ENCOUNTER — Telehealth (HOSPITAL_BASED_OUTPATIENT_CLINIC_OR_DEPARTMENT_OTHER): Payer: Self-pay | Admitting: Internal Medicine

## 2012-08-23 NOTE — Progress Notes (Signed)
Spoke with Carmine at [facility]. There are active prescriptions with refills on file for Losartan 50mg , Hydrochlorothiazide 25mg  & Omeprazole 40mg . Pharmacy is refilling the medications and will be ready for the patient to pick up shortly.    No refill requests placed at this time.

## 2012-08-23 NOTE — Patient Instructions (Signed)
[facility]    Person calling on behalf of patient: Patient (self)    May list multiple medications in this section    Medicine Name: losartan (COZAAR) 50 MG tablet,hydrochlorothiazide (HYDRODIURIL) 25 MG tablet,omeprazole (PRILOSEC) 40 MG capsule      Dosage:   Frequency (how many pills, how many times a day):     Number of pills left:     Documented patient preferred pharmacies:   [facility] ([facility])  Phone: [phone] Fax: [phone]        CALL BACK NUMBER: [phone]    Patient's language of care: [place]    Patient needs a  interpreter.

## 2012-09-03 ENCOUNTER — Ambulatory Visit (HOSPITAL_BASED_OUTPATIENT_CLINIC_OR_DEPARTMENT_OTHER): Payer: PRIVATE HEALTH INSURANCE | Admitting: Internal Medicine

## 2012-09-03 ENCOUNTER — Encounter (HOSPITAL_BASED_OUTPATIENT_CLINIC_OR_DEPARTMENT_OTHER): Payer: Self-pay | Admitting: Internal Medicine

## 2012-09-03 VITALS — BP 128/80 | Ht 64.0 in | Wt 208.0 lb

## 2012-09-03 DIAGNOSIS — R196 Halitosis: Secondary | ICD-10-CM

## 2012-09-03 DIAGNOSIS — R12 Heartburn: Secondary | ICD-10-CM

## 2012-09-03 DIAGNOSIS — Z1211 Encounter for screening for malignant neoplasm of colon: Secondary | ICD-10-CM

## 2012-09-03 DIAGNOSIS — E669 Obesity, unspecified: Secondary | ICD-10-CM

## 2012-09-03 DIAGNOSIS — H02403 Unspecified ptosis of bilateral eyelids: Secondary | ICD-10-CM

## 2012-09-03 DIAGNOSIS — I119 Hypertensive heart disease without heart failure: Secondary | ICD-10-CM

## 2012-09-03 LAB — URINE DIP (POINT OF CARE)
BILIRUBIN, URINE: NEGATIVE
GLUCOSE, URINE: NEGATIVE mg/dl
KETONE, URINE: NEGATIVE mg/dl
LEUKOCYTE ESTERASE: NEGATIVE
NITRITE, URINE: POSITIVE — AB
PH URINE: 5 (ref 5.0–8.0)
PROTEIN, URINE: NEGATIVE mg/dl (ref 0–15)
SPECIFIC GRAVITY URINE: 1.025 — AB (ref 1.003–1.030)
UROBILINOGEN URINE: 0.2 mg/dl (ref 0.2–1.0)

## 2012-09-03 NOTE — Progress Notes (Signed)
SUBJECTIVE  Mariah [name] is a 56 year old female for BP follow up and pap smear    1) Pap smear  Pt had hysterectomy d/t fibroids > 20 years ago. Literature review indicates that "Women who have undergone hysterectomy -- All three organizations concur that women who have undergone total hysterectomy for benign disease should discontinue screening for cervical cancer" (from up to date)    2)  HTN  Brother died of liver cancer in [place], last week. Pt reports that BP was very elevated last week, but has since then normalized  She reports taking BP medication on a regular basis, but sometimes she forgets.   She denies head aches, chest pain, SOB, palpitations, dizziness, vision changes, neurological changes  She reports palpitations when she takes diet pills.(see #4)    3) Eye lids  Pt reports that eye lids are growing, and becoming very bothersome. Eyes feel heavy, and at times blurry. She denies vision changes, or difficulty seeing.      4) Weight management  Pt discouraged with weight, this has been a long and ongoing issue for her. She has a hx of taking diet pills, and recently started taking them again. She gets them from [place]. There are 3 different pills, that she does not know what they are for, or their names. She says that when she takes them, she has palpitations, sometimes. She says that she has not been taking them for the past month, because of the palpitations.   She is interested in bariatric surgery, wants more information about it.  She has been unable to keep a regular exercise schedule. She reports seeing the nutritionist in the past, but did not find it helpful.    Most Recent Weight Reading(s)  09/03/12 : 208 lb (94.348 kg)  06/25/12 : 209 lb (94.802 kg)  04/29/12 : 202 lb (91.627 kg)  04/23/12 : 205 lb (92.987 kg)  04/03/12 : 198 lb (89.812 kg)    5) Stomach pain,   Taking omeprazole 40 mg, daily, still with burning. Has appt with GI in November  Also with occasional nausea    5)  Halitosis  Uses alcohol based mouth washes    BP 128/80   Ht 5\' 4"  (1.626 m)   Wt 208 lb (94.348 kg)   BMI 35.69 kg/m2  BP recheck by me: 150/100  BP recheck with automatic machine   L - 148/99   R 198/101 in recheck 186/95  O2% 100  bpm 59    Eyes: symmetric, no lesions. The eye lids appear to be larger, as overgrown. Eyes do not appear irritated, there is no swelling, no erythema, EOM intact, PERRLA  Heart: S1 and S2 normal, no murmurs, clicks, gallops or rubs. Regular rate and rhythm.   Lungs:  clear; no wheezes, rhonchi or rales.  Abdomen: soft, bs in 4 quadrants, not distended, no guarding, no masses, no tenderness, no organomegaly, no suprapubic or epigastric pain        ASSESSMENT & PLAN:  (796.2) Elevated blood pressure reading without diagnosis of hypertension  (primary encounter diagnosis)  Comment: Elevated today, and of particular concern is the difference systolic reading from left to right (148 and 186-198 respectively). Discussed with Dr Gwyneth Sprout.  Increase Losartan to 150 mg daily. Return to clinic in one week to recheck BP. If there is still a significant difference between L and R arm BP, consider R/o thoracic aneurysm with chest CT.   Plan: EKG, CBC + PLT +  AUTO DIFF, BASIC METABOLIC         PANEL, COLLECTION VENOUS BLOOD VENIPUNCTURE,         URINE DIP (POINT OF CARE)          (374.30) Ptosis, both eyelids  Comment:   Plan: REFERRAL TO PLASTIC SURGERY ( INT)          (V76.51) Screen for colon cancer  Comment: Colonoscopy discussed, pt is having a hard time finding the time to prep, and to have someone to pick her up. Discussed iPOC, and pt agrees to do it. Test procedure discussed, pt verbalizes understanding.  Plan: POC IMMUNOASSAY FECAL OCCULT BLOOD TEST          (278.00) Obesity, unspecified  Comment: Pt is frustrated at her difficulties to lose weight. Pt not willing to see nutritionist, saw her in the past, didn't find it helpful. Was unable to establish an exercise routine.   Plan: I will  find out more information re: bariatric surgery and other modalities to improve pt's success in weight loss.    (787.1) Heartburn  Comment: Chart review indicates dx of hiatus hernia in 2007. We discussed what this means, and how it can predispose her to heartburn. We discussed avoiding irritants, waiting 1-2 hours after eating, before laying down, and eating small portions at a time. She is already on Omeprazole 40mg , that she takes OTC  Plan: Will defer to GI, as pt has appt scheduled in November    (784.99) Halitosis  Comment: Discussed avoiding alcohol-based mouth washes, as these can kill healthy bacteria that lives in mouth and keeps balanced flora.   Plan: Try instead non-alcohol based mouth wash    -- Screening for cervical cancer  Per new guidelines, pt does not need any further screening d/t hx of hysterectomy. HM section updated    follow-up will be scheduled in 1 week as above  she has been advised to call or return with any worsening or new problems    -------------------------------------  Pt's interest for bariatric surgery discussed with Dr Gwyneth Sprout in supervision.  She may not qualify, as in most programs, criteria is for BMI > 40  Alternative option: weight watchers

## 2012-09-03 NOTE — Patient Instructions (Addendum)
Para o mau halito, procure um enxague que nao tenha alcool ("alcohol free")    -- Para a pressao:   aumentar o losartan para 150 mg. Tomar dois comprimidos de manha, e um de noite.    Voltar na clinica na semana que vem, para verificarmos a pressao novamente.

## 2012-09-05 ENCOUNTER — Ambulatory Visit (HOSPITAL_BASED_OUTPATIENT_CLINIC_OR_DEPARTMENT_OTHER): Payer: PRIVATE HEALTH INSURANCE | Admitting: Internal Medicine

## 2012-09-05 ENCOUNTER — Encounter (HOSPITAL_BASED_OUTPATIENT_CLINIC_OR_DEPARTMENT_OTHER): Payer: Self-pay | Admitting: Internal Medicine

## 2012-09-05 LAB — EKG

## 2012-09-06 ENCOUNTER — Ambulatory Visit (HOSPITAL_BASED_OUTPATIENT_CLINIC_OR_DEPARTMENT_OTHER): Payer: PRIVATE HEALTH INSURANCE | Admitting: Lab

## 2012-09-06 DIAGNOSIS — I119 Hypertensive heart disease without heart failure: Secondary | ICD-10-CM

## 2012-09-06 LAB — COMPREHENSIVE METABOLIC PANEL
ALANINE AMINOTRANSFERASE: 35 IU/L (ref 7–35)
ALBUMIN: 4.1 g/dl (ref 3.4–4.8)
ALKALINE PHOSPHATASE: 124 IU/L — ABNORMAL HIGH (ref 25–106)
ANION GAP: 6 mmol/L (ref 3–11)
ASPARTATE AMINOTRANSFERASE: 28 IU/L (ref 8–34)
BILIRUBIN TOTAL: 0.5 mg/dl (ref 0.2–1.1)
BUN (UREA NITROGEN): 17 mg/dl (ref 6–20)
CALCIUM: 9.4 mg/dl (ref 8.6–10.3)
CARBON DIOXIDE: 29 mmol/L (ref 22–32)
CHLORIDE: 103 mmol/L (ref 101–111)
CREATININE: 0.8 mg/dl (ref 0.4–1.2)
ESTIMATED GLOMERULAR FILT RATE: >60 mL/min (ref >60)
Glucose Random: 87 mg/dl (ref 74–160)
POTASSIUM: 4 mmol/L (ref 3.5–5.1)
SODIUM: 138 mmol/L (ref 135–144)
TOTAL PROTEIN: 7.2 g/dl (ref 5.9–7.5)

## 2012-09-06 LAB — CBC, PLATELET & DIFFERENTIAL
ABSOLUTE BASO COUNT: 0.1 10*3/uL (ref 0.0–0.1)
ABSOLUTE EOSINOPHIL COUNT: 0.1 10*3/uL (ref 0.0–0.8)
ABSOLUTE IMM GRAN COUNT: 0.01 10*3/uL (ref 0.00–0.03)
ABSOLUTE LYMPH COUNT: 2.9 10*3/uL (ref 0.6–5.9)
ABSOLUTE MONO COUNT: 0.6 10*3/uL (ref 0.2–1.4)
ABSOLUTE NEUTROPHIL COUNT: 4.5 10*3/uL (ref 1.6–8.3)
BASOPHIL %: 0.6 % (ref 0.0–1.2)
EOSINOPHIL %: 1.6 % (ref 0.0–7.0)
HEMATOCRIT: 37.1 % (ref 34.1–44.9)
HEMOGLOBIN: 12.1 g/dL (ref 11.2–15.7)
IMMATURE GRANULOCYTE %: 0.1 % (ref 0.0–0.4)
LYMPHOCYTE %: 35.4 % (ref 15.0–54.0)
MEAN CORP HGB CONC: 32.6 g/dL (ref 31.0–37.0)
MEAN CORPUSCULAR HGB: 27.3 pg (ref 26.0–34.0)
MEAN CORPUSCULAR VOL: 83.7 fL (ref 80.0–100.0)
MEAN PLATELET VOLUME: 10.1 fL (ref 8.7–12.5)
MONOCYTE %: 7.7 % (ref 4.0–13.0)
NEUTROPHIL %: 54.6 % (ref 40.0–75.0)
PLATELET COUNT: 305 10*3/uL (ref 150–400)
RBC DISTRIBUTION WIDTH STD DEV: 42.6 fL (ref 35.1–46.3)
RBC DISTRIBUTION WIDTH: 13.9 % (ref 11.5–14.3)
RED BLOOD CELL COUNT: 4.43 M/uL (ref 3.90–5.20)
WHITE BLOOD CELL COUNT: 8.3 10*3/uL (ref 4.0–11.0)

## 2012-09-06 NOTE — Progress Notes (Signed)
.  labs done

## 2012-09-12 ENCOUNTER — Ambulatory Visit (HOSPITAL_BASED_OUTPATIENT_CLINIC_OR_DEPARTMENT_OTHER): Payer: PRIVATE HEALTH INSURANCE | Admitting: Internal Medicine

## 2012-09-12 VITALS — BP 138/83 | HR 96 | Ht 64.0 in | Wt 207.0 lb

## 2012-09-12 DIAGNOSIS — I119 Hypertensive heart disease without heart failure: Secondary | ICD-10-CM

## 2012-09-12 DIAGNOSIS — IMO0002 Reserved for concepts with insufficient information to code with codable children: Secondary | ICD-10-CM

## 2012-09-12 DIAGNOSIS — Z23 Encounter for immunization: Secondary | ICD-10-CM

## 2012-09-12 MED ORDER — LOSARTAN POTASSIUM 50 MG PO TABS
ORAL_TABLET | ORAL | Status: DC
Start: 2012-09-12 — End: 2013-03-18

## 2012-09-12 NOTE — Progress Notes (Signed)
VIS given prior to administration and reviewed with the patient and or legal guardian. Patient understands the disease and the vaccine. See immunization/Injection module or chart review for date of publication and additional information.

## 2012-09-12 NOTE — Patient Instructions (Signed)
Please make an appointment with Lamanda Terra in 1-2 weeks.

## 2012-09-12 NOTE — Progress Notes (Signed)
Mariah [name] is a 56 year old female here in followup  For losartan she is taking 2 in the am and 1 at night  Feels well  BP sometimes in 150s - 180s at home  Still with higher BP in her right arm    PE:  BP 138/83   Pulse 96   Ht 5\' 4"  (1.626 m)   Wt 207 lb (93.895 kg)   BMI 35.51 kg/m2   SpO2 100%  Right arm 155/80  Left arm 135/80    PLAN:  (402.10) Benign hypertensive heart disease without heart failure  (primary encounter diagnosis)  Comment: cozaar take 100 mg a day  Plan: losartan (COZAAR) 50 MG tablet            (V04.81) Need for prophylactic vaccination and inoculation against influenza  Comment: per RN  Plan: IMMUNIZATION ADMIN SINGLE, RN, INFLUENZA VIRUS         VACCINE SPLIT VIRUS 3/> YRS IM            (796.2) Abnormal Blood Pressure  Comment: due to blood pressure discrepancy, refer for chest CT to look for aneurism or subclavian steal  Plan: ORDER FOR CT SCAN OR CT ANGIOGRAPHY

## 2012-09-16 ENCOUNTER — Ambulatory Visit (HOSPITAL_BASED_OUTPATIENT_CLINIC_OR_DEPARTMENT_OTHER): Payer: Self-pay | Admitting: Internal Medicine

## 2012-09-17 LAB — CTA ABDOMEN W &/OR WO IV CONTRAST

## 2012-09-17 LAB — CTA CHEST W &/OR WO CONTRAST

## 2012-09-24 ENCOUNTER — Encounter (HOSPITAL_BASED_OUTPATIENT_CLINIC_OR_DEPARTMENT_OTHER): Payer: Self-pay | Admitting: Internal Medicine

## 2012-09-24 DIAGNOSIS — I771 Stricture of artery: Secondary | ICD-10-CM | POA: Insufficient documentation

## 2012-10-11 ENCOUNTER — Encounter (HOSPITAL_BASED_OUTPATIENT_CLINIC_OR_DEPARTMENT_OTHER): Payer: Self-pay | Admitting: Internal Medicine

## 2012-10-29 ENCOUNTER — Ambulatory Visit (HOSPITAL_BASED_OUTPATIENT_CLINIC_OR_DEPARTMENT_OTHER): Payer: PRIVATE HEALTH INSURANCE | Admitting: Gastroenterology

## 2012-10-29 VITALS — BP 150/88 | HR 83 | Temp 98.1°F | Resp 16 | Ht 65.0 in | Wt 208.0 lb

## 2012-10-29 DIAGNOSIS — R748 Abnormal levels of other serum enzymes: Secondary | ICD-10-CM

## 2012-10-29 DIAGNOSIS — K219 Gastro-esophageal reflux disease without esophagitis: Secondary | ICD-10-CM

## 2012-10-29 NOTE — Progress Notes (Signed)
Date of Service: 10/29/2012    [name] was seen in consultation at the request of Dr. Babs Sciara for heartburn.    HISTORY OF PRESENT ILLNESS:  The patient is a 56 year old woman from [place] who has been complaining of increasing heartburn and occasional dysphagia in spite of being on daily omeprazole.  The patient was seen by me in 2007 for heartburn and diarrhea.  She had an endoscopy that showed the presence of erosions at the GE junction.  She has been taking the Prilosec 1 tablet daily with good initial response, but recently she has not been responding to treatment well, and she has had breakthrough heartburn and sometimes dysphagia to solid foods.  She denies having any nausea or vomiting.  She has had a gradual weight gain since 2007.  She does not indulge in food items that worsen acid reflux.    PAST MEDICAL HISTORY:  Diarrhea, elevated alkaline phosphatase, depression, hypertension, renal stone, pulmonary nodules, GERD, allergic rhinitis, osteoporosis, subclavian artery stenosis.    PERSONAL HISTORY:  She does not smoke or drink.    FAMILY HISTORY:  Noncontributory.    ALLERGIES:  Heparin and lisinopril.    MEDICATIONS:  Cozaar, Prilosec, and HydroDIURIL    PHYSICAL EXAMINATION:  GENERAL:  She is healthy looking, in no distress.  VITAL SIGNS:  Blood pressure 150/88, pulse 83, temperature 98.1, oxygen saturation 100%, and weight 208 pounds.  HEAD AND ENT:  No icterus, pallor, or mouth lesions.  NECK:  No venous congestion or masses.  HEART:  Rhythmic and regular with no murmurs.  LUNGS:  Clear and resonant.  ABDOMEN:  The abdomen is soft with no organomegaly, masses, or tenderness.  EXTREMITIES:  No edema or cyanosis.  SKIN:  No lesions.    ASSESSMENT AND PLAN:  This patient has had worsening gastroesophageal reflux disease symptoms, and on a previous endoscopy 6 years ago she had erosions at the gastroesophageal junction, so a repeat endoscopy will be scheduled.    On review of her laboratory data I noticed  that her alkaline phosphatase has been slightly elevated.  The rest of the liver function tests are normal.  She denies having any itching, right upper quadrant pain, or dark urine.  It is not clear whether the alkaline phosphatase is of liver origin or bone origin.  I elected to repeat her liver function tests and to obtain a 5-prime nucleotidase to see if the origin of the alkaline phosphatase is from the liver.    I will see her in followup after the upper endoscopy.    ___________________________  Reviewed and Electronically Signed By: Marrianne Mood MD  Sig Date: 11/05/2012  Sig Time: 16:48:16  Dictated By: Marrianne Mood MD  Dict Date: 10/29/2012 Dict Time: 01 23 PM    Dictation Date and Time:10/29/2012 13:23:20  Transcription Date and Time:10/29/2012 15:40:44  eScription Dictation id: [id number] Confirmation # :[id number]      cc: Vennesa TERRA APRN    DICTATED BY: Marrianne Mood MD[facility] MDD:10/29/2012 13:23:20 T:10/29/2012 15:40:44 KN Job#: [id number]

## 2012-10-29 NOTE — Progress Notes (Signed)
Primary note dictated through E-Scription.  See "Notes" tab in Chart Review for transcribed note; final note also appears within encounter.    I have spent  minutes in face to face time with this patient/patient proxy of which > 50% was in counseling or coordination of care regarding above issues/Dx.      Patient understands and agrees with the plan  This office note has been dictated. Account number [id number]

## 2012-10-31 ENCOUNTER — Telehealth (HOSPITAL_BASED_OUTPATIENT_CLINIC_OR_DEPARTMENT_OTHER): Payer: Self-pay | Admitting: Internal Medicine

## 2012-10-31 DIAGNOSIS — I119 Hypertensive heart disease without heart failure: Secondary | ICD-10-CM

## 2012-10-31 NOTE — Progress Notes (Signed)
.  [facility]    Person calling on behalf of patient: Patient (self)    May list multiple medications in this section    Medicine Name: hydrochlorothiazide (HYDRODIURIL) 25 MG tablet /omeprazole (PRILOSEC) 20 MG capsule     Dosage:     Frequency (how many pills, how many times a day):     Number of pills left:     Documented patient preferred pharmacies:   [facility] ([facility])  Phone: [phone] Fax: [phone]      Pharmacy Name:     Pharmacy Telephone Number:     Pharmacy  Fax Number:     CALL BACK NUMBER:     Cell phone:      Other phone:     Available times:     Patient's language of care: [place]    Patient needs a  interpreter.

## 2012-10-31 NOTE — Progress Notes (Deleted)
Per patient :    Mariah [name] is a 56 year old female has requested a refill of Hctz 25 mg and omeprazole 20 mg.    Last Physical Exam 03/08/2005    Last Office Visit 09/12/2012    Other Med Adult:  Most Recent BP Reading(s)  10/29/12 : 150/88        Cholesterol (mg/dl)   Date  Value    0/98/1191  212*   ----------    LDL DIRECT (mg/dl)   Date  Value    4/78/2956  134*   ----------    HDL (mg/dl)   Date  Value    01/31/864  61    ----------    TRIGLYCERIDES (mg/dl)   Date  Value    7/84/6962  67    ----------        THYROID SCREEN TSH REFLEX FT4 (uIU/mL)   Date  Value    10/22/2006  1.45    ----------        TSH (THYROID STIM HORMONE) (uIU/mL)   Date  Value    04/03/2012  1.68    ----------      No results found for this basename: [id number]        INR (no units)   Date  Value    01/18/2007  1.0*   ----------       Documented patient preferred pharmacies:    [facility] ([facility])  Phone: [phone] Fax: [phone]

## 2012-11-01 ENCOUNTER — Ambulatory Visit (HOSPITAL_BASED_OUTPATIENT_CLINIC_OR_DEPARTMENT_OTHER)
Admit: 2012-11-01 | Disposition: A | Discharge status: Home / Self Care | Payer: Self-pay | Source: Ambulatory Visit | Attending: Gastroenterology | Admitting: Gastroenterology

## 2012-11-01 LAB — GI OPERATIVE NOTE

## 2012-11-01 LAB — 5' NUCLEOTIDASE: 5' NUCLEOTIDASE: 16 IU/L — AB (ref 0–10)

## 2012-11-01 MED ORDER — DIPHENHYDRAMINE HCL 50 MG/ML IJ SOLN
25.0000 mg | Freq: Once | INTRAMUSCULAR | Status: DC | PRN
Start: 2012-11-01 — End: 2012-11-01

## 2012-11-01 MED ORDER — WATER FOR IRRIGATION, STERILE IR SOLN
Freq: Once | Status: DC
Start: 2012-11-01 — End: 2012-11-01

## 2012-11-01 MED ORDER — FENTANYL CITRATE 0.05 MG/ML IJ SOLN
INTRAMUSCULAR | Status: AC
Start: 2012-11-01 — End: 2012-11-01
  Administered 2012-11-01: 100 ug via INTRAVENOUS
  Filled 2012-11-01: qty 4

## 2012-11-01 MED ORDER — LIDOCAINE 2% UROJET
10.0000 mL | Freq: Once | CUTANEOUS | Status: DC
Start: 2012-11-01 — End: 2012-11-01

## 2012-11-01 MED ORDER — MIDAZOLAM HCL 5 MG/5ML IJ SOLN
0.5000 mg | INTRAMUSCULAR | Status: DC
Start: 2012-11-01 — End: 2012-11-01
  Administered 2012-11-01: 5 mg via INTRAVENOUS

## 2012-11-01 MED ORDER — ONDANSETRON HCL 4 MG/2ML IJ SOLN
4.0000 mg | INTRAMUSCULAR | Status: DC | PRN
Start: 2012-11-01 — End: 2012-11-01

## 2012-11-01 MED ORDER — SODIUM CHLORIDE 0.9 % IV SOLN
INTRAVENOUS | Status: DC
Start: 2012-11-01 — End: 2012-11-01
  Administered 2012-11-01: 10:00:00 via INTRAVENOUS

## 2012-11-01 MED ORDER — MIDAZOLAM HCL 5 MG/5ML IJ SOLN
INTRAMUSCULAR | Status: AC
Start: 2012-11-01 — End: 2012-11-01
  Administered 2012-11-01: 5 mg via INTRAVENOUS
  Filled 2012-11-01: qty 10

## 2012-11-01 MED ORDER — FENTANYL CITRATE 0.05 MG/ML IJ SOLN
25.0000 ug | INTRAMUSCULAR | Status: DC
Start: 2012-11-01 — End: 2012-11-01
  Administered 2012-11-01: 100 ug via INTRAVENOUS

## 2012-11-01 NOTE — H&P (Signed)
GI Pre-procedure History and Physical Short Form  Mariah [name] is an 56 year old female.    Chief Complaint: She is being scheduled for Upper GI endoscopy    The history is provided by the patient. A language interpreter was used.           Active Problems:  Patient Active Problem List:     depressive disorder     Obesity, unspecified     LATERAL FOOT PAIN     Benign hypertensive heart disease without heart failure     Nonspecific abnormal results of liver function study     Insomnia, unspecified     Calculus of kidney     Diarrhea     ABN FIND-STOOL CONTENTS-OCC BLOOD     nodules on chest CT 1/06 and 3/07     Heartburn     ALLERGIC RHINITIS NEC     Cough     ABSENCE OF MENSTRUATION - s/p TAH BSO     TUBERCULIN TEST REACTION NO TBC     Osteoporosis     Murmur heart     Family History of Throat Cancer     Vitamin D Deficiency     Inconclusive mammogram     Subclavian artery stenosis, left     Alkaline phosphatase elevation     Esophageal reflux      History (Medical, Surgical, Social, Family):    Past Medical History    Screening for hypertension 08/05/2004    Comment: occ elevated BP started on anti-HTN in [place]; chem 7, EKG, HCT u/a wnl 4/03; d/c'ed HCTZ 8/05 when we learned from daughter she has not been taking it    ANXIETY DEPRESSION 08/05/2004    Comment: TSH wnl 8/02; trial of fluoxetine 8/05    PERS HX TOBACCO USE 08/05/2004    Comment: quit 10/02; 1/2 PPD since 56 YO    BACK PAIN LOW 08/05/2004    Comment: LBP musculoskeletal most likely 1/04    OBESITY NOS 08/05/2004    Comment: has tried meds from [place]    PERS HX OF PAST NONCOMPLIANCE 08/05/2004    Comment: poor compliance to med regimen    Hyperparathyroidism 10/22/2006    Comment: osteoporosis found - pt referred to surgery for the hyperparathyroidism- s/p parathyroidectomy feb 2008    Vitamin d deficiency 05/19/2008    Comment: Supplement per dr Jeannine Kitten.    Diarrhea 03/27/2006    Comment: 7/07 The colonoscopy was visually normal and biopsies taken to  rule out microscopic colitis were negative.  A biopsy of the descending duodenum to rule out celiac sprue because of the diarrhea was negative.      ABN FIND-STOOL CONTENTS-OCC BLOOD 03/27/2006    Comment: s/p colonoscopy and EGD 7/07    Cough 04/23/2006    Comment: s/p PFTs September 2007 --  some obstruction at mid to low lung volumes not correctable by bronchodilator CT scan showed stable pulmonary nodules over several years 11/07 - cough gone ** note combined with dx of "nodules on CT scan". Will file this note to hx.            Past Surgical History    TOTAL ABDOMINAL HYSTERECT W/WO RMVL TUBE OVARY  5/00    Comment Hysterectomy, Total Abdominal secondary to fibroids    SALPINGO-OOPHORECTOMY COMPL/PRTL UNI/BI SPX  5/00    Comment Salpingo-Oophorectomy, bilateral    PARATHYROIDECTOMY  2006    Comment partial,     TONSILLECTOMY ONE-HALF  Social History   Marital Status: Legally Separated  Spouse Name: N/A    Years of Education: N/A  Number of Children: N/A     Occupational History  None on file     Social History Main Topics   Smoking status: Former Smoker  0.50 Packs/Day  For 40.00 Years     Types: Cigarettes    Quit date: 03/31/2012    Smokeless tobacco:     Comment:     Alcohol Use: Yes    Comment: very occasional    Drug Use: No    Sexually Active: Yes  Partner(s): Female    Comment: histerectomy     Other Topics Concern   None on file     Social History Narrative    work: Land; owned bakery in [place]    Lives w/ daughter 24 YO (Grazielle [place]) & son-in-law Kern [place])    Divorced; currently not in a relationship       Family History    Cancer - Lung Father     Comment: died from lung CA- throat cancer per pt. Also on dyalisis for 10 years    Hypertension Mother     Lipids Mother     Comment: hyperlipidemia    Heart Maternal Uncle     Comment: died 69 from MI    Diabetes Maternal Aunt     Non-contributory      Comment: no first deg rel. w/ breast ca    Diabetes Mother     Comment: also morbidly  weight    Cancer - Other Brother     Comment: liver cancer, Sept 2013       Allergies:   Review of Patient's Allergies indicates:   Heparin                     Comment:local rash seen during hospitalization Feb 2008   Lisinopril              Cough    Medications:     Current Outpatient Prescriptions on File Prior to Encounter:  losartan (COZAAR) 50 MG tablet Take 2 tablets po in the morning Disp: 60 tablet Rfl: 5   omeprazole (PRILOSEC) 20 MG capsule Take 20 mg by mouth daily. Disp:  Rfl:    hydrochlorothiazide (HYDRODIURIL) 25 MG tablet Take 1 tablet by mouth daily. Disp: 30 tablet Rfl: 2     No current facility-administered medications on file prior to encounter.    Vitals:  BP 139/83   Pulse 64   Temp(Src) 98.2 F (36.8 C)   Resp 17   Ht 5\' 5"  (1.651 m)   Wt 208 lb (94.348 kg)   BMI 34.61 kg/m2   SpO2 99%    Review of Systems   Constitutional: Negative for fever, appetite change and fatigue.   HENT: Negative.    Respiratory: Negative.    Cardiovascular: Negative.    Gastrointestinal: Negative.    Neurological: Negative.           Physical Exam   Vitals reviewed.  Constitutional: She is oriented to person, place, and time. She appears well-developed and well-nourished. No distress.   HENT:   Head: Normocephalic and atraumatic.   Mouth/Throat: Oropharynx is clear and moist.   Eyes: Conjunctivae are normal.   Neck: Normal range of motion. Neck supple.   Cardiovascular: Normal rate and normal heart sounds.    No murmur heard.  Pulmonary/Chest: Effort normal and breath sounds normal.   Abdominal: Soft.  Bowel sounds are normal. She exhibits no mass (No organomegaly).   Musculoskeletal: She exhibits no edema.   Neurological: She is alert and oriented to person, place, and time.   Skin: Skin is warm and dry. She is not diaphoretic.          Airway Evaluation:  Gag reflex intact: Yes  Ability to open mouth wide:   Full  Dentures:  No  Loose teeth:  No  Neck range of motion  Full    Mallampati Airway Classification: Class  I     A soft palate, fauces, uvula, anterior and posterior tonsil pillars are seen.  Mallampati Airway Classification:     ASA Classification: ASA Class II (a patient with mild systemic disease)    Assessment:  Proceed with procedure

## 2012-11-01 NOTE — Discharge Instructions (Signed)
[facility] DISCHARGE INSTRUCTIONS     Quando voc for para casa  possvel que se sinta sonolento(a). Descanse bastante pelo resto do dia.   When you return home you may feel sleepy. Get plenty of rest for the remainder of the day.    Se voc tiver recebido algum sedative para o procedimento NO DIRIJA, NO  MANUSEIE MQUINAS NEM TOME NENHUMA DECISO IMPORTANTE durante o resto do dia.  If you received sedation for your procedure DO NOT DRIVE, OPERATE MACHINERY, OR MAKE IMPORTANT DECISIONS for the remainder of the day.     Aps ter feito uma ENDOSCOPIA DIGESTIVA ALTA (GASTROENTEROSCOPIA)  normal se ter uma dor de garganta branda que pode durar por alguns dias, mas se voc tiver  DORES NO PEITO, FALTA DE AR, DIFICULDADE PARA ENGOLIR, VMITOS DE SANGUE VERMELHO VIVO OU SE NOTAR AS FEZES NEGRAS OU MARROM ESCURAS OU SE VOC SE SENTIR FRACO(A) OU CANSADO(A), entre em contato com o seu mdico imediatamente.    It is normal after having an UPPER ENDOSCOPY to develop a mild sore throat that will last for a few days, but, if you develop CHEST PAIN, SHORTNESS OF BREATH, DIFFICULTY SWALLOWING, VOMIT BRIGHT RED BLOOD, NOTICE YOUR BOWEL MOVEMENTS ARE BLACK OR MAROON COLORED OR YOU FEEL WEAK AND TIRED, call your doctor immediately.     Se tiver feito uma bipsia ou Polipectomia voc ter que suspender a aspirina ou os medicamentos que contenham aspirina por {3} dias.                  If you had a biopsy or Polypectomy you will need to hold aspirin or medication containing aspirin for _3__days.                                               Se voc tiver feito uma bipsia ou Polipectomia voc ter que suspender qualquer medicamento tais como Advil, Motrin, Naproxin, Ibuprofen etc por {3} dias.  If you had a biopsy or Polypectomy you will need to hold any medication such as Advil, Motrin, Naproxin, Ibuprofen etc for_3__days.     Entre em contato com o seu mdico se houver  qualquer outro sintoma fora do normal.  Call you physician for any other unusual symptoms.     Se por qualquer razo voc no conseguir entrar em contato com o seu mdico v para a sala de Freight forwarder prxima.   If for any reason you are unable to reach your doctor go to the nearest Emergency Room.    Instrues Especficas:  Specific Instructions:   Preoperatively reviewed by Admitting RN

## 2012-11-07 LAB — SURGICAL PATH SPECIMEN

## 2012-11-26 ENCOUNTER — Ambulatory Visit (HOSPITAL_BASED_OUTPATIENT_CLINIC_OR_DEPARTMENT_OTHER): Payer: PRIVATE HEALTH INSURANCE | Admitting: Internal Medicine

## 2012-12-03 ENCOUNTER — Ambulatory Visit (HOSPITAL_BASED_OUTPATIENT_CLINIC_OR_DEPARTMENT_OTHER): Payer: PRIVATE HEALTH INSURANCE

## 2012-12-06 ENCOUNTER — Telehealth (HOSPITAL_BASED_OUTPATIENT_CLINIC_OR_DEPARTMENT_OTHER): Payer: Self-pay | Admitting: Internal Medicine

## 2012-12-06 NOTE — Progress Notes (Signed)
.  [facility]    Person calling on behalf of patient: Patient (self)    May list multiple medications in this section    Medicine Name: losartan (COZAAR) 50 MG tablet, omeprazole (PRILOSEC) 20 MG capsule    Dosage:     Frequency (how many pills, how many times a day):     Number of pills left:     Documented patient preferred pharmacies:   [facility] ([facility])  Phone: [phone] Fax: [phone]      Pharmacy Name:     Pharmacy Telephone Number:     Pharmacy  Fax Number:     CALL BACK NUMBER:     Cell phone:      Other phone:     Available times:     Patient's language of care: [place]    Patient needs a [place] interpreter.

## 2012-12-06 NOTE — Progress Notes (Signed)
Confirmed with [facility] on 12/06/2012 and there are refills on file for Omeprazole 20 MG & Losartan 100 MG and those medications will be filled for patient today. No new prescription necessary at this time.

## 2012-12-12 ENCOUNTER — Ambulatory Visit (HOSPITAL_BASED_OUTPATIENT_CLINIC_OR_DEPARTMENT_OTHER): Payer: Self-pay | Admitting: Gastroenterology

## 2012-12-16 ENCOUNTER — Ambulatory Visit (HOSPITAL_BASED_OUTPATIENT_CLINIC_OR_DEPARTMENT_OTHER): Payer: Self-pay | Admitting: Internal Medicine

## 2012-12-19 ENCOUNTER — Encounter (HOSPITAL_BASED_OUTPATIENT_CLINIC_OR_DEPARTMENT_OTHER): Payer: Self-pay | Admitting: Internal Medicine

## 2012-12-19 ENCOUNTER — Ambulatory Visit (HOSPITAL_BASED_OUTPATIENT_CLINIC_OR_DEPARTMENT_OTHER): Payer: PRIVATE HEALTH INSURANCE | Admitting: Gastroenterology

## 2012-12-19 ENCOUNTER — Ambulatory Visit (HOSPITAL_BASED_OUTPATIENT_CLINIC_OR_DEPARTMENT_OTHER): Payer: Self-pay | Admitting: Internal Medicine

## 2012-12-19 VITALS — BP 159/87 | HR 87 | Temp 97.6°F | Resp 15 | Wt 211.0 lb

## 2012-12-19 DIAGNOSIS — K269 Duodenal ulcer, unspecified as acute or chronic, without hemorrhage or perforation: Secondary | ICD-10-CM

## 2012-12-19 DIAGNOSIS — K219 Gastro-esophageal reflux disease without esophagitis: Secondary | ICD-10-CM

## 2012-12-19 DIAGNOSIS — Z8719 Personal history of other diseases of the digestive system: Secondary | ICD-10-CM | POA: Insufficient documentation

## 2012-12-19 DIAGNOSIS — K2970 Gastritis, unspecified, without bleeding: Secondary | ICD-10-CM

## 2012-12-19 DIAGNOSIS — R748 Abnormal levels of other serum enzymes: Secondary | ICD-10-CM

## 2012-12-19 DIAGNOSIS — K222 Esophageal obstruction: Secondary | ICD-10-CM

## 2012-12-19 HISTORY — DX: Gastritis, unspecified, without bleeding: K29.70

## 2012-12-19 LAB — MA DIAGNOSTIC MAMMO UNILATERAL LEFT WITH CAD

## 2012-12-19 LAB — HEPATIC FUNCTION PANEL
ALANINE AMINOTRANSFERASE: 71 IU/L — ABNORMAL HIGH (ref 7–35)
ALBUMIN: 4.3 g/dl (ref 3.4–4.8)
ALKALINE PHOSPHATASE: 142 IU/L — ABNORMAL HIGH (ref 25–106)
ASPARTATE AMINOTRANSFERASE: 40 IU/L — ABNORMAL HIGH (ref 8–34)
BILIRUBIN DIRECT: 0.1 mg/dl (ref 0.0–0.2)
BILIRUBIN TOTAL: 0.5 mg/dl (ref 0.2–1.1)
INDIRECT BILIRUBIN: 0.4 mg/dl (ref 0.2–0.9)
TOTAL PROTEIN: 7.6 g/dl — ABNORMAL HIGH (ref 5.9–7.5)

## 2012-12-19 NOTE — Progress Notes (Signed)
Primary note dictated through E-Scription.  See "Notes" tab in Chart Review for transcribed note; final note also appears within encounter.    I have spent  minutes in face to face time with this patient/patient proxy of which > 50% was in counseling or coordination of care regarding above issues/Dx.      Patient understands and agrees with the plan  This office note has been dictated. Account number [id number]

## 2012-12-20 ENCOUNTER — Encounter (HOSPITAL_BASED_OUTPATIENT_CLINIC_OR_DEPARTMENT_OTHER): Payer: Self-pay | Admitting: Internal Medicine

## 2012-12-20 LAB — MA SCREENING MAMMO BILATERAL WITH CAD

## 2012-12-20 NOTE — Progress Notes (Signed)
Date of Service: 12/19/2012    HISTORY OF PRESENT ILLNESS:  [name] was seen in followup after she had an upper endoscopy to investigate dysphagia and heartburn and for mild elevation of her alkaline phosphatase.    The upper endoscopy showed a hiatus hernia with a nonobstructing Schatzki ring.  Multiple erosions in the gastric antrum, and a duodenal ulcer in the duodenal bulb, as well as a few erosions in the bulb.      A biopsy of the distal esophagus showed a benign squamous epithelium with no abnormality.  A biopsy of the stomach showed chronic gastritis and no H. pylori-like organisms.    The patient relates that Omeprazole controls her symptoms. She also admits to using Ibuprofen on a regular basis for headache and joint pains.    As for her abnormal alkaline phosphatase, the patient had a 5 prime nucleotidase test to find out if the alkaline phosphatase was of liver origin or bone origin and the result showed an elevated 5 prime nucleotidase thus the alkaline phosphatase elevation is of liver origin.    The patient was asked about itching, and she relates that she has been having mild itching without any skin lesions associated with this complaint.    PHYSICAL EXAMINATION:  VITAL SIGNS:  Blood pressure 159/87, pulse 87, oxygen saturation 100%, and weight 211 pounds.  HEAD AND ENT:  She has no icterus or pallor.  She has no cutaneous manifestations of chronic liver disease.  She has no skin lesions.  ABDOMEN:  Shows no hepatosplenomegaly or ascites.  EXTREMITIES:  She has no leg edema.    ASSESSMENT AND PLAN:  I told the patient that she should continue taking Omeprazole, and I advised her to stop taking the Ibuprofen  replace this with Tylenol.    As far as the alkaline phosphatase abnormality and the fact that it is of liver origin, it is important to rule out early primary biliary cirrhosis, so repeat liver function tests were requested, as well as an antimitochondrial antibody.    I will see her again in  1 months' time.    ___________________________  Reviewed and Electronically Signed By: Marrianne Mood MD  Sig Date: 12/20/2012  Sig Time: 13:55:02  Dictated By: Marrianne Mood MD  Dict Date: 12/20/2012 Dict Time: 10 20 AM    Dictation Date and Time:12/20/2012 10:20:11  Transcription Date and Time:12/20/2012 11:12:52  eScription Dictation id: [id number] Confirmation # :[id number]      cc: Conleigh TERRA APRN    DICTATED BY: Marrianne Mood MDKarim Wade Sigala MDD:12/20/2012 10:20:11 T:12/20/2012 11:12:52 [place] Job#: [id number]

## 2012-12-23 LAB — ANTI-MITOCHONDRIAL ANTIBODY: ANTI-MITOCHONDRIAL ANTIBODY: 20.4 Units — AB (ref 0.0–20.0)

## 2012-12-31 ENCOUNTER — Ambulatory Visit (HOSPITAL_BASED_OUTPATIENT_CLINIC_OR_DEPARTMENT_OTHER): Payer: PRIVATE HEALTH INSURANCE | Admitting: Internal Medicine

## 2013-01-07 NOTE — Progress Notes (Signed)
The patient left the office without being seen.

## 2013-01-20 ENCOUNTER — Telehealth (HOSPITAL_BASED_OUTPATIENT_CLINIC_OR_DEPARTMENT_OTHER): Payer: Self-pay | Admitting: Internal Medicine

## 2013-01-20 NOTE — Progress Notes (Signed)
Confirmed with [facility] there are refills on file for both Omeprazole & Losartan  Both prescriptions will be filled today for the patient  No new prescriptions are necessary at this time.

## 2013-01-20 NOTE — Progress Notes (Signed)
[facility]    Person calling on behalf of patient: Patient (self)    May list multiple medications in this section    Medicine Name: OMEPRAZOLE; LOSARTAN     Dosage:     Frequency (how many pills, how many times a day):     Number of pills left:     Documented patient preferred pharmacies:   [facility] ([facility])  Phone: [phone] Fax: [phone]    CALL BACK NUMBER: [phone]    Cell phone:      Other phone:     Available times:     Patient's language of care: [place]    Patient needs a [place] interpreter.

## 2013-01-21 ENCOUNTER — Ambulatory Visit (HOSPITAL_BASED_OUTPATIENT_CLINIC_OR_DEPARTMENT_OTHER): Payer: PRIVATE HEALTH INSURANCE | Admitting: Internal Medicine

## 2013-01-21 ENCOUNTER — Encounter (HOSPITAL_BASED_OUTPATIENT_CLINIC_OR_DEPARTMENT_OTHER): Payer: Self-pay | Admitting: Internal Medicine

## 2013-01-21 VITALS — BP 180/84 | HR 67 | Ht 64.0 in | Wt 210.0 lb

## 2013-01-21 DIAGNOSIS — I119 Hypertensive heart disease without heart failure: Secondary | ICD-10-CM

## 2013-01-21 DIAGNOSIS — R748 Abnormal levels of other serum enzymes: Secondary | ICD-10-CM

## 2013-01-21 DIAGNOSIS — K269 Duodenal ulcer, unspecified as acute or chronic, without hemorrhage or perforation: Secondary | ICD-10-CM

## 2013-01-21 DIAGNOSIS — E669 Obesity, unspecified: Secondary | ICD-10-CM

## 2013-01-21 MED ORDER — OMEPRAZOLE 20 MG PO CPDR
20.0000 mg | DELAYED_RELEASE_CAPSULE | Freq: Two times a day (BID) | ORAL | Status: DC
Start: 2013-01-21 — End: 2014-03-19

## 2013-01-21 NOTE — Patient Instructions (Signed)
Contact Information   [facility]   [facility]   [facility], 3rd Floor   [address]   [place], [place] [address]   [phone]   [phone]   [email or web address]     Appointments  Call: [phone]  Fax: [phone]        [facility]  Weight Loss Surgery  [facility]  3rd Floor, [address]  [address]  [place], [place] [address]

## 2013-01-21 NOTE — Progress Notes (Signed)
SUBJECTIVE  Mariah [name] is a 57 year old female for follow up on the following issues:    1) Gastritis  Pt wants to know results from endoscopy. She continues having heartburn, especially after drinking wine (which she does in very rare occasions), or using NSAIDs (which she does on occasions)  Results reviewed and explained to pt in lay terms    2) Elevated alkaline phosphatase  She's been followed up by Dr Lajean Saver. We reviewed his notes. Pt had many questions about it.     3) Obesity  Pt requests info re: bariatric surgery.   Most Recent Weight Reading(s)  01/21/13 : 210 lb (95.255 kg)  11/01/12 : 208 lb (94.348 kg)  10/29/12 : 208 lb (94.348 kg)  09/12/12 : 207 lb (93.895 kg)  09/03/12 : 208 lb (94.348 kg)    4) HTN  Pt reports taking medication every day. She denies head aches, chest pain, palpitations, dizziness, diaphoresis, leg edema. She said that today she woke up late, and left the house without taking med. She says that this does not happen regularly.  Pt says that she uses salt to season her food, and occasionally she uses bouillons.   She does not exercise regularly    Most Recent BP Reading(s)  11/01/12 : 121/62  10/29/12 : 150/88  09/12/12 : 138/83  09/03/12 : 128/80    OBJECTIVE:  BP 180/84   Pulse 67   Ht 5\' 4"  (1.626 m)   Wt 210 lb (95.255 kg)   BMI 36.03 kg/m2   SpO2 100%  BP rechecked by me    Heart: S1 and S2 normal, no murmurs, clicks, gallops or rubs. Regular rate and rhythm.   Lungs:  clear; no wheezes, rhonchi or rales.  Abdomen: soft, bs in 4 quadrants, not distended, no guarding, no masses, no tenderness, no organomegaly, no suprapubic or epigastric pain    ASSESSMENT & PLAN:  (532.90) Duodenal ulcer, unspecified as acute or chronic, without hemorrhage, perforation, or obstruction  (primary encounter diagnosis)  Comment: Pt educated about findings from endoscopy  Discussed avoidance of irritating substances, like alcohol, spicy foods, fat/fried food, caffeine, tomato, chocolate.  Discussed avoiding large meals, especially at night. Rather, do small frequent meals. Discussed waiting 1-2 hours after eating to lay down. Discussed elevating head of bed  Plan: omeprazole (PRILOSEC) 20 MG capsule           (402.10) Benign hypertensive heart disease without heart failure  Comment: Elevated today, but pt did not take medication  Plan: Restart medication. Low sodium diet. Schedule RN visit in 1 and 2 weeks to recheck BP. Adjust med dose as needed    (790.5) Alkaline phosphatase elevation  Comment: Pt instructed to FU with GI,  Plan:     (278.00) Obesity, unspecified  Comment: Regular exercise, low calories discussed  Plan: Info re: programs that do bariatric surgery. Pt to find out information, let us know if she needs referrals in place.      follow-up will be scheduled prn, after BP checks  she has been advised to call or return with any worsening or new problems

## 2013-01-22 ENCOUNTER — Telehealth (HOSPITAL_BASED_OUTPATIENT_CLINIC_OR_DEPARTMENT_OTHER): Payer: Self-pay

## 2013-01-22 ENCOUNTER — Encounter (HOSPITAL_BASED_OUTPATIENT_CLINIC_OR_DEPARTMENT_OTHER): Payer: Self-pay | Admitting: Internal Medicine

## 2013-01-22 NOTE — Telephone Encounter (Signed)
Message copied by Marnette Burgess on Wed Jan 22, 2013  5:00 PM  ------       Message from: Florestine Avers       Created: Wed Jan 22, 2013  4:52 PM       Regarding: please outreach         Hi ladies,       I had asked pt to return in 1 and 2 weeks for BP recheck, but it was not scheduled. Could you please reach out to her for this?       FYI, she does have a different reading from L to R, usually L runs higher d/t stenosis. Please don't panic if you notice it!       [facility] speaker)       Byrd Hesselbach  ------

## 2013-01-22 NOTE — Progress Notes (Signed)
Line was busy. Will try again tomorrow.

## 2013-01-23 NOTE — Progress Notes (Signed)
Unable to reach pt, left voicemail requesting call back.

## 2013-01-24 ENCOUNTER — Telehealth (HOSPITAL_BASED_OUTPATIENT_CLINIC_OR_DEPARTMENT_OTHER): Payer: Self-pay

## 2013-01-24 NOTE — Progress Notes (Signed)
called w/inter., lft v/m for pt to contact eye doctor for referral to plastics.

## 2013-01-29 NOTE — Progress Notes (Signed)
Unable to reach pt, left voicemail requesting call back.

## 2013-01-30 ENCOUNTER — Ambulatory Visit (HOSPITAL_BASED_OUTPATIENT_CLINIC_OR_DEPARTMENT_OTHER): Payer: PRIVATE HEALTH INSURANCE | Admitting: Internal Medicine

## 2013-02-05 ENCOUNTER — Ambulatory Visit (HOSPITAL_BASED_OUTPATIENT_CLINIC_OR_DEPARTMENT_OTHER): Payer: PRIVATE HEALTH INSURANCE

## 2013-02-18 ENCOUNTER — Telehealth (HOSPITAL_BASED_OUTPATIENT_CLINIC_OR_DEPARTMENT_OTHER): Payer: Self-pay | Admitting: Internal Medicine

## 2013-02-18 NOTE — Progress Notes (Signed)
Confirmed with [facility] there are refills on file for all three medications  No new prescriptions necessary at this time

## 2013-02-18 NOTE — Patient Instructions (Signed)
[facility]    Person calling on behalf of patient: Patient (self)    May list multiple medications in this section    Medicine Name:losartan (COZAAR) 50 MG tablet, hydrochlorothiazide (HYDRODIURIL) 25 MG tablet,omeprazole (PRILOSEC) 20 MG capsule      Dosage:     Frequency (how many pills, how many times a day):     Number of pills left:     Documented patient preferred pharmacies:   [facility] ([facility])  Phone: [phone] Fax: [phone]        CALL BACK NUMBER: [phone]      Patient's language of care: [place]    Patient needs a [place] interpreter.

## 2013-03-11 ENCOUNTER — Ambulatory Visit (HOSPITAL_BASED_OUTPATIENT_CLINIC_OR_DEPARTMENT_OTHER): Payer: PRIVATE HEALTH INSURANCE

## 2013-03-14 ENCOUNTER — Other Ambulatory Visit (HOSPITAL_BASED_OUTPATIENT_CLINIC_OR_DEPARTMENT_OTHER): Payer: Self-pay | Admitting: Internal Medicine

## 2013-03-14 NOTE — Progress Notes (Signed)
error 

## 2013-03-18 ENCOUNTER — Encounter (HOSPITAL_BASED_OUTPATIENT_CLINIC_OR_DEPARTMENT_OTHER): Payer: Self-pay | Admitting: Internal Medicine

## 2013-03-18 ENCOUNTER — Ambulatory Visit (HOSPITAL_BASED_OUTPATIENT_CLINIC_OR_DEPARTMENT_OTHER): Payer: PRIVATE HEALTH INSURANCE | Admitting: Internal Medicine

## 2013-03-18 VITALS — BP 192/86 | HR 63 | Ht 64.0 in | Wt 213.0 lb

## 2013-03-18 DIAGNOSIS — I771 Stricture of artery: Secondary | ICD-10-CM

## 2013-03-18 DIAGNOSIS — H02403 Unspecified ptosis of bilateral eyelids: Secondary | ICD-10-CM

## 2013-03-18 DIAGNOSIS — R079 Chest pain, unspecified: Secondary | ICD-10-CM

## 2013-03-18 DIAGNOSIS — I119 Hypertensive heart disease without heart failure: Secondary | ICD-10-CM

## 2013-03-18 DIAGNOSIS — S335XXA Sprain of ligaments of lumbar spine, initial encounter: Secondary | ICD-10-CM

## 2013-03-18 MED ORDER — HYDROCHLOROTHIAZIDE 25 MG PO TABS
25.0000 mg | ORAL_TABLET | Freq: Every day | ORAL | Status: DC
Start: 2013-03-18 — End: 2013-09-09

## 2013-03-18 MED ORDER — LOSARTAN POTASSIUM 50 MG PO TABS
ORAL_TABLET | ORAL | Status: DC
Start: 2013-03-18 — End: 2014-01-06

## 2013-03-18 MED ORDER — CYCLOBENZAPRINE HCL 5 MG PO TABS
5.00 mg | ORAL_TABLET | Freq: Three times a day (TID) | ORAL | Status: AC | PRN
Start: 2013-03-18 — End: 2013-04-02

## 2013-03-18 MED ORDER — ATORVASTATIN CALCIUM 20 MG PO TABS
20.0000 mg | ORAL_TABLET | Freq: Every day | ORAL | Status: DC
Start: 2013-03-18 — End: 2013-09-09

## 2013-03-18 NOTE — Progress Notes (Signed)
SUBJECTIVE  Mariah [name] is a 57 year old female    1) Low back pain  Chronic, usually a discomfort. Exacerbated since yesterday. No falls, no injuries. Pain exacerbated during the day, gradual onset, really bad at night  Last night was at its worse, 11/10, couldn't find a position to be comfortable.  Took ibuprofen 800 mg q 4 h.  from her daughter, and was able to sleep. Last dose at 1 pm today.   No paresthesias  No pain with urination, no hematuria  No fever, chills,   Nausea yesterday d/t increased pain, and epigastric pain. Pt has hx of gastritis.  Pt works as Financial trader. Started working with Systems analyst last week, attempting to lose weight.    2) Intermittent chest pain, ? Tingling on left arm,   last episode 2 months ago. Pt says that she came to the clinic to request an appt, but was given today's appt. She denies any recent episodes. She denies associated diaphoresis, dizziness, SOB. Pt is unable to describe the pain, but denies it being a pressure, or severe pain. She's usually able to go on her daily activities with it.   Pt is non smoker. She has HTN, and has left subclavian stenosis.     3) HTN  Pt reports that he takes his medications daily. Denies chest pain, SOB, DOE, palpitations, dizziness, head aches, leg edema.   She has not taken her meds today, usually takes them at night. She is very upset right now, d/t traffic, "I almost run over this lady, and almost had an accident". She's teary.    OBJECTIVE:  BP 192/86   Pulse 63   Ht 5\' 4"  (1.626 m)   Wt 213 lb (96.616 kg)   BMI 36.54 kg/m2   SpO2 100%  BP recheck 190/85 L; 180/85 R.  Pleasant, teary, speaking in full sentences, in moderate distress    Heart: S1 and S2 normal, no murmurs, clicks, gallops or rubs. Regular rate and rhythm.   Lungs:  clear; no wheezes, rhonchi or rales.  LE: no edema  Back: no deformities. Tenderness to palpation on lumbar region. No pain on buttocks. Neg straight leg raise. No abnormal gait, although  pt gets up from chair with moderate difficulty. Restricted ROM.    ASSESSMENT & PLAN:  (847.2) Sprain of lumbar region  (primary encounter diagnosis)  Comment: Likely muscular. Discontinue NSAID d/t epigastric pain. Use acetaminophen, no more than 4g/day. Cyclobenzaprine at night. Frequent pelvic rolls, stretching exercises demonstrated.  Plan: cyclobenzaprine (FLEXERIL) 5 MG tablet            (440.8) Subclavian artery stenosis, left  Comment: Will refer to cardiology to establish care.  Plan: REFERRAL TO CARDIOLOGY ( INT), atorvastatin         (LIPITOR) 20 MG tablet          (786.50) Chest pain, unspecified  Intermittent episodes, last one > 2 months ago. Pt educated re: requesting to speak to a nurse if calling the clinic with chest pain, for proper triage. HTN and stenosis may put pt at increased risk for CVD. Go to the ED if pain does not resolve, is more frequent and/or severe, or if there are associated sx of diaphoresis, nausea, dizziness. Refer to cardiology as above.    (374.30) Ptosis, both eyelids  Comment: Pt received call that she needed new referral.   Plan: REFERRAL TO PLASTIC SURGERY ( INT)            (  402.10) Benign hypertensive heart disease without heart failure  Comment: BP elevated today, likely due to increased traffic stressor. Pt has not taken medication today. Take as soon as she gets home. RTC once a week, for the next 3 weeks, for RN visit  for BP rechecks.   Plan: losartan (COZAAR) 50 MG tablet,         hydrochlorothiazide (HYDRODIURIL) 25 MG tablet              follow-up will be scheduled per BP readings, or prn  she has been advised to call or return with any worsening or new problems

## 2013-03-19 NOTE — Addendum Note (Signed)
Addended by: Florestine Avers on: 03/19/2013 10:28 AM     Modules accepted: Orders

## 2013-03-26 ENCOUNTER — Ambulatory Visit (HOSPITAL_BASED_OUTPATIENT_CLINIC_OR_DEPARTMENT_OTHER): Payer: PRIVATE HEALTH INSURANCE

## 2013-04-02 ENCOUNTER — Ambulatory Visit (HOSPITAL_BASED_OUTPATIENT_CLINIC_OR_DEPARTMENT_OTHER): Payer: PRIVATE HEALTH INSURANCE

## 2013-04-09 ENCOUNTER — Ambulatory Visit (HOSPITAL_BASED_OUTPATIENT_CLINIC_OR_DEPARTMENT_OTHER): Payer: PRIVATE HEALTH INSURANCE

## 2013-04-10 ENCOUNTER — Ambulatory Visit (HOSPITAL_BASED_OUTPATIENT_CLINIC_OR_DEPARTMENT_OTHER): Payer: PRIVATE HEALTH INSURANCE | Admitting: Gastroenterology

## 2013-04-21 ENCOUNTER — Ambulatory Visit (HOSPITAL_BASED_OUTPATIENT_CLINIC_OR_DEPARTMENT_OTHER): Payer: PRIVATE HEALTH INSURANCE | Admitting: Internal Medicine

## 2013-04-21 ENCOUNTER — Encounter (HOSPITAL_BASED_OUTPATIENT_CLINIC_OR_DEPARTMENT_OTHER): Payer: Self-pay | Admitting: Internal Medicine

## 2013-04-21 VITALS — BP 170/96 | HR 70 | Resp 16 | Ht 64.0 in | Wt 210.0 lb

## 2013-04-21 DIAGNOSIS — I119 Hypertensive heart disease without heart failure: Secondary | ICD-10-CM

## 2013-04-21 DIAGNOSIS — R079 Chest pain, unspecified: Secondary | ICD-10-CM

## 2013-04-21 DIAGNOSIS — I739 Peripheral vascular disease, unspecified: Secondary | ICD-10-CM

## 2013-04-21 DIAGNOSIS — I771 Stricture of artery: Secondary | ICD-10-CM

## 2013-04-21 NOTE — Progress Notes (Signed)
This office note has been dictated. Account number [id number]

## 2013-04-21 NOTE — Progress Notes (Signed)
New pt      H/o hypertension  Pt denies chest pain or sob         Pt needs further evaluation   And possible treatment    Due to the language barrier, the office visit was conducted in [place] with an interpreter. The interpreter's name is . The interpreter was on the phone during the entire visit.lives with husband      Safe at home

## 2013-04-22 ENCOUNTER — Ambulatory Visit (HOSPITAL_BASED_OUTPATIENT_CLINIC_OR_DEPARTMENT_OTHER): Payer: PRIVATE HEALTH INSURANCE | Admitting: Internal Medicine

## 2013-04-24 ENCOUNTER — Encounter (HOSPITAL_BASED_OUTPATIENT_CLINIC_OR_DEPARTMENT_OTHER): Payer: Self-pay | Admitting: Ophthalmology

## 2013-04-24 ENCOUNTER — Ambulatory Visit (HOSPITAL_BASED_OUTPATIENT_CLINIC_OR_DEPARTMENT_OTHER): Payer: PRIVATE HEALTH INSURANCE | Admitting: Ophthalmology

## 2013-04-24 DIAGNOSIS — H524 Presbyopia: Secondary | ICD-10-CM

## 2013-04-24 DIAGNOSIS — H5200 Hypermetropia, unspecified eye: Secondary | ICD-10-CM

## 2013-04-24 DIAGNOSIS — H02839 Dermatochalasis of unspecified eye, unspecified eyelid: Secondary | ICD-10-CM

## 2013-04-24 NOTE — Progress Notes (Signed)
.  Mariah [name] was seen at the [facility] center.    She is c.o. Droopy eye lids.    On exam she has mild, fairly symmetrical mild ptosis.    She did not have worsening of the ptosis after prolonged up gaze.  The slight ptosis did not improve after prolonged lid closure.    She has significant dermatochalasis.    She can see an oculo plastic surgeon to discuss an elective blepharoplasty.    She was told to rtc if the amount of ptosis worsens at all.    She will rtc in 6-12 months to re evaluate the lids.

## 2013-04-24 NOTE — Nursing Note (Signed)
>>   Mariah [name]     Thu Apr 24, 2013  2:37 PM  C/o saggy/droopy eyelids blocking her vision. No eye pain, Va is good cc.

## 2013-04-30 ENCOUNTER — Ambulatory Visit (HOSPITAL_BASED_OUTPATIENT_CLINIC_OR_DEPARTMENT_OTHER): Payer: Self-pay | Admitting: Internal Medicine

## 2013-05-01 LAB — ECHOCARDIOGRAM W/ DOPPLER

## 2013-05-02 LAB — EKG

## 2013-05-05 ENCOUNTER — Ambulatory Visit (HOSPITAL_BASED_OUTPATIENT_CLINIC_OR_DEPARTMENT_OTHER): Payer: Self-pay | Admitting: Internal Medicine

## 2013-05-19 NOTE — Progress Notes (Signed)
Date of Service: 04/21/2013    Apr 21, 2013        Florestine Avers, APRN  [facility]  [address], [place]  [address]    RE:  Mariah [name]    Dear Dr. Babs Sciara:    I saw [name] in the [place] office on 05 May in consultation regarding hypertension and left subclavian artery stenosis.      You will recall, she is a 57 year old [place] mother of 8 whose problem list includes hypertension, nephrolithiasis, depression, DU with hemorrhage, GERD, heart murmur, pulmonary nodules, Schatzki's ring, and the left subclavian artery stenosis.    Meds today per EPIC include Lipitor 20, hydrochlorothiazide 25, losartan 100, and omeprazole 40.  There are allergies to HEPARIN with rash, LISINOPRIL with cough, and HYDROCHLOROTHIAZIDE with high calcium.    Family history is pertinent for heart disease in a maternal uncle who succumbed at age 48 to MI.  Socially, she takes rare alcohol with no other substances and lives with her daughter and son-in-law.  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    She had a CTA last October because of a blood pressure discrepancy of her right arm 180 and left arm 140.  She had a normal anatomic variant with a 2-vessel arch with the right and left common carotids originate in the brachiocephalic.  There was calcification at origin of the left subclavian vein at the arch, but no narrowing.  There was no thoracic aortic aneurysm or dissection.  This does not explain to me why there should be a blood pressure discrepancy.    The abdominal aorta showed some calcium but otherwise looked good.  No kidney stones were seen.  I am going to remove subclavian stenosis from her problem list.    She had an EKG on September 17 of last year, which revealed, to my personal review now, sinus at 60 with LVH by voltage but no strain changes and the atria look normal.  She had an echocardiogram 1 year ago, which showed a normal  size and wall thickness of the ventricle with good LVEF of 57% and no regional wall motion abnormalities.  There was mild MR with RVSP normal at 25, so it is a very good echo.    She has an EKG with [place] today, which shows pretty much what it showed before and that is sinus at 68 with LVH by voltage in the limb leads by 2 different criteria, but there is no strain and no LAE and no QRS widening or LAD.    She fills out our 10-system ROS form and indicates the issues cited above, and all other systems are reviewed and are negative.    Today on exam, she stands 5 feet 4 inches and weighs 210 pounds, probably 50 pounds in excess.  The heart rate is 70 and regular, and the pressure 170/96, breathing 16, with 99% sat on room air.  Her pressure April 1 was 192/86 and on February 4 was 180/84.  It is not stated which arm was measured.    I measured her pressure at 184/120 in the right arm and 156/104 in the left arm.  I went ahead and did palpation though and it felt like 180 on both sides.  It is very difficult to measure her pressures because they are very soft and I think I am going to send her to  the vascular lab for careful Doppler recordings of her blood pressures to see if this is a reproducible finding because it is not for me at least by palpation, although it is by auscultation and I do not see a good reason for her to have a difference in pressures by that CT angio.  Ocular muscles are intact, and the tongue is midline with symmetric face and good dentition.  No palpable cervical nodes or thyroid and no JVD.  Heart is regular with a 1/6 systolic murmur.  The abdomen is soft and obese without palpable organ, mass, or tenderness.  There is no rash, clubbing, cyanosis, or edema.  She is fully oriented and has appropriate affect.    She tells me she walks 1 block before stopping for fatigue and dyspnea.  She does walk 1 floor of stairs.  She sleeps on 1 pillow and has nocturia x1.    A month ago, she had many episodes  of recurrent chest pain lasting 20 minutes each, which was fairly severe and did not seem to be pleuritic or postural.  There was no associated nausea, diaphoresis, and may be a little dyspnea.  It has been gone now for a month and was it unprecedented.    She associates this only with anxiety, and I think need not to be pursued.  There is no problem with dizziness, syncope, or falling.    ___________________________  Reviewed and Electronically Signed By: Allegra Grana MD  Sig Date: 05/19/2013  Sig Time: 15:20:36  Dictated By: Allegra Grana MD  Dict Date: 04/21/2013 Dict Time: 05 36 PM    Dictation Date and Time:04/21/2013 17:36:18  Transcription Date and Time:04/21/2013 20:48:29  eScription Dictation id: [id number] Confirmation # :[id number]      cc: Florestine Avers APRN

## 2013-05-20 ENCOUNTER — Ambulatory Visit (HOSPITAL_BASED_OUTPATIENT_CLINIC_OR_DEPARTMENT_OTHER): Payer: PRIVATE HEALTH INSURANCE | Admitting: Internal Medicine

## 2013-05-20 ENCOUNTER — Encounter (HOSPITAL_BASED_OUTPATIENT_CLINIC_OR_DEPARTMENT_OTHER): Payer: Self-pay | Admitting: Internal Medicine

## 2013-05-20 VITALS — BP 120/80 | Temp 98.0°F | Ht 64.0 in | Wt 210.0 lb

## 2013-05-20 DIAGNOSIS — R922 Inconclusive mammogram: Secondary | ICD-10-CM

## 2013-05-20 DIAGNOSIS — Z1211 Encounter for screening for malignant neoplasm of colon: Secondary | ICD-10-CM

## 2013-05-20 DIAGNOSIS — M25569 Pain in unspecified knee: Secondary | ICD-10-CM

## 2013-05-20 DIAGNOSIS — I119 Hypertensive heart disease without heart failure: Secondary | ICD-10-CM

## 2013-05-20 NOTE — Progress Notes (Signed)
SUBJECTIVE  Mariah [name] is a 57 year old female, [place] speaker, with the following concerns:    1) Right leg pain, behind the knee. No injuries. Cannot squat all the way. Sometimes moving the knee side to side causes pain. Pain is constant, alleviated by 800 mg NSAID. Pain is not stabbing or acute, but "annoying". There is a constant level of 4/10 pain, that is worsened by movement. Feels like the back of the thigh is very tight.    2) Constant low back pain, localized, "annoying type of pain". Sometimes it radiates to the buttock. No paresthesias.  Pt works in English as a second language teacher, about 7 houses a day. Drives long distances, automatic car. No regular exercise, obese    Most Recent Weight Reading(s)  05/20/13 : 210 lb (95.255 kg)  04/21/13 : 210 lb (95.255 kg)  03/18/13 : 213 lb (96.616 kg)  01/21/13 : 210 lb (95.255 kg)  11/01/12 : 208 lb (94.348 kg)    3) HTN  Pt reports that he takes his medications daily. Denies chest pain, SOB, DOE, palpitations, dizziness, head aches, leg edema.   Most Recent BP Reading(s)  05/20/13 : 120/80  04/21/13 : 170/96  03/18/13 : 192/86  01/21/13 : 180/84  11/01/12 : 121/62    OBJECTIVE:  BP 120/80   Temp(Src) 98 F (36.7 C) (Oral)   Ht 5\' 4"  (1.626 m)   Wt 210 lb (95.255 kg)   BMI 36.03 kg/m2  BP recheck: 170/90, Left  Pleasant, in NAD    Heart: S1 and S2 normal, no murmurs, clicks, gallops or rubs. Regular rate and rhythm.   Lungs:  clear; no wheezes, rhonchi or rales.  Back: On inspection, back is symmetric. There are no obvious deformities, erythema or edema. There is no tenderness to palpation over the cervical spine. There is no tenderness to palpation on paravertebral muscles. There is tenderness to palpation on lumbar region. Her back feels very tight. She has full range of motion on flexion, extension, and rotation. Straight leg raise is negative bilaterally, but she notices tightening of right hamstring  Knees: no deformities. Symmetric. No tenderness to  palpation, or extension. Pt reports pain with full flexion, and moving knee side to side.    ASSESSMENT & PLAN:  (719.46) Pain in joint, lower leg  (primary encounter diagnosis)  Comment: meniscal tear vs related to tight muscles. PT discussed, and encouraged, but pt says that she does not have time for it bc of working hours.  Plan: REFERRAL TO ORTHOPEDICS ( INT)            (793.82) Inconclusive mammogram  Comment: due for repeat  Plan: ORDER FOR MAMMOGRAM (DIAGNOSTIC)            (V76.51) Colon cancer screening  Comment: Colonoscopy discussed, and declined. Pt says she cannot accommodate her schedule to have it done. She agrees to do iFOB.  Plan: POC IMMUNOASSAY FECAL OCCULT BLOOD TEST            (402.10) Benign hypertensive heart disease without heart failure  Comment: elevated, pt is being evaluated by cardiology to understand BP discrepancy between right and left arm (see note 5/5,"I  am going to send her to the vascular lab for careful Doppler recordings of her blood pressures to see if this is a reproducible finding because it is not for me at least by palpation, although it is by auscultation and I do not see a good reason for her to have a difference in pressures  by that CT angio.). She has a FU aptp with Dr Risser in July.  Plan: Monitor    We discussed the patients current medications. The patient expressed understanding and no barriers to adherence were identified.    follow-up will be scheduled prn  she has been advised to call or return with any worsening or new problems

## 2013-06-04 ENCOUNTER — Ambulatory Visit (HOSPITAL_BASED_OUTPATIENT_CLINIC_OR_DEPARTMENT_OTHER): Payer: PRIVATE HEALTH INSURANCE | Admitting: Orthopaedic Surgery

## 2013-06-04 ENCOUNTER — Encounter (HOSPITAL_BASED_OUTPATIENT_CLINIC_OR_DEPARTMENT_OTHER): Payer: Self-pay | Admitting: Orthopaedic Surgery

## 2013-06-04 ENCOUNTER — Other Ambulatory Visit (HOSPITAL_BASED_OUTPATIENT_CLINIC_OR_DEPARTMENT_OTHER): Payer: Self-pay | Admitting: Licensed Practical Nurse

## 2013-06-04 DIAGNOSIS — M545 Low back pain, unspecified: Secondary | ICD-10-CM

## 2013-06-04 DIAGNOSIS — M25561 Pain in right knee: Secondary | ICD-10-CM

## 2013-06-04 DIAGNOSIS — M533 Sacrococcygeal disorders, not elsewhere classified: Secondary | ICD-10-CM

## 2013-06-04 DIAGNOSIS — M238X9 Other internal derangements of unspecified knee: Secondary | ICD-10-CM

## 2013-06-04 LAB — XR LUMBAR SPINE WITH OBLIQUES, FLEXION & EXTENSION

## 2013-06-04 LAB — XR PELVIS AP 1 OR 2 VIEWS

## 2013-06-04 LAB — XR KNEE RIGHT MINIMUM 4 VIEWS

## 2013-06-04 LAB — XR KNEES STANDING AP ONLY BILATERAL

## 2013-06-04 NOTE — Progress Notes (Signed)
The primary progress note for this visit has been dictated through E-Scription. It can be viewed as an attachment to this encounter or through Chart Review under the Notes Tab as an Orthopedic Office Note.      Review of Systems: Constitutional, Eyes, ENT/Mouth, Cardiovascular, Respiratory, GI, GU, Neuro, Psych, Heme/Lymph, Skin, Musculoskeletal was reviewed and is NEGATIVE except for what is dictated in the note.    MT ACCT #:  [id number]

## 2013-06-05 NOTE — Progress Notes (Signed)
Date of Service: 06/04/2013    SUBJECTIVE:  The patient comes in because of pain in her right knee.  She says she had no history of trauma and it started 6 months ago for unknown reasons; it seems to be getting somewhat worse.  It is a constant pain.  She points to the popliteal fossa area and just slightly above and slightly below is where she has the pain.  It is there all the time, it is worse when she flexes her knee.  She has more difficulty stair climbing, and also she cannot go to the gym and exercise on a treadmill because it makes it hurt more.  Normal walking does not make it hurt more, it just hurts constantly.  Denies previous problems with her knee.      PAST MEDICAL HISTORY:  She has history of hypertension, duodenal ulcer, depression, kidney stones, esophageal reflux, osteoporosis, vitamin D deficiency, left subclavian artery stenosis.    ALLERGIES:  She is allergic to heparin and lisinopril.      MEDICATIONS:  Include Motrin, biotin, Cozaar, HydroDIURIL, Lipitor, and Prilosec.      SOCIAL HISTORY:  She does not smoke anymore, quite a year ago and only occasionally drinks.  She is legally separated.  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    OBJECTIVE:  She is a slightly overweight female in moderate distress with pain in the posterior aspect of the right knee.  Inspection of her knee shows no swelling, no erythema, no rashes, no masses, no spasm, no deformity, no discoloration.  There is no effusion in her knee at all.  She has no pain with patellar motion and compression.  She gets pain with flexion of her knee, particularly beyond 90 degrees and it is all in the popliteal fossa.  Spring's test is also slightly positive.  She says when she walks, it does make her knee pain worse and she denies giving out or locking; however, she cannot squat down because of severe pain in the popliteal fossa area.  She has negative straight leg raising from a sitting and supine position, but she does  have some tenderness in the right sciatic notch, but it does not really radiate down her leg.  Occasionally says she feels a little pain in her thigh.  Most of the time it is just in the popliteal fossa of her knee.  She has good pulses in both feet.  She has full range of motion of the left knee without pain.  Range of motion of her hip does not seem to give her discomfort but Patrick's maneuver gives her a lot of pain in her knee, particularly if you try and flex it too much.  Flexion of the hip does not give her pain.  She denies paresthesias in the leg.  There are no sensory, motor, or reflex deficits noted in the right lower extremity or the left lower extremity as mentioned, there are good pulses in both feet and good strength in the extensor hallucis longus muscles of both feet.  There is some mild tenderness to palpation in the popliteal fossa but it is not well localized.  X-rays of her knees do not show any significant pathology except for a little minor osteoarthritic change.  The x-rays of her hips, and her pelvis, and her spine do not show any significant pathology.    ASSESSMENT:  Internal derangement of the right knee.    PLAN:  I offered to inject her  knee to see if that took away the pain, but she did not want to have an injection.  I am also going to get an MRI scan of her knee to see if we can get a diagnosis on what is causing this popliteal fossa pain.    ___________________________  Reviewed and Electronically Signed By: Edwyna Ready MD  Sig Date: 06/05/2013  Sig Time: 20:41:54  Dictated By: Edwyna Ready MD  Dict Date: 06/04/2013 Dict Time: 02 47 PM    Dictation Date and Time:06/04/2013 14:47:49  Transcription Date and Time:06/05/2013 09:26:14  eScription Dictation id: [id number] Confirmation # :[id number]

## 2013-06-10 ENCOUNTER — Ambulatory Visit (HOSPITAL_BASED_OUTPATIENT_CLINIC_OR_DEPARTMENT_OTHER): Payer: Self-pay | Admitting: Orthopaedic Surgery

## 2013-06-10 DIAGNOSIS — M25561 Pain in right knee: Secondary | ICD-10-CM

## 2013-06-11 LAB — MRI KNEE RIGHT WO CONTRAST

## 2013-06-17 ENCOUNTER — Encounter (HOSPITAL_BASED_OUTPATIENT_CLINIC_OR_DEPARTMENT_OTHER): Payer: Self-pay | Admitting: Internal Medicine

## 2013-06-17 ENCOUNTER — Ambulatory Visit (HOSPITAL_BASED_OUTPATIENT_CLINIC_OR_DEPARTMENT_OTHER): Payer: PRIVATE HEALTH INSURANCE | Admitting: Internal Medicine

## 2013-06-17 VITALS — BP 120/80 | Temp 98.1°F | Ht 64.0 in | Wt 209.0 lb

## 2013-06-17 DIAGNOSIS — R748 Abnormal levels of other serum enzymes: Secondary | ICD-10-CM

## 2013-06-17 DIAGNOSIS — K2970 Gastritis, unspecified, without bleeding: Secondary | ICD-10-CM

## 2013-06-17 DIAGNOSIS — Z0000 Encounter for general adult medical examination without abnormal findings: Secondary | ICD-10-CM

## 2013-06-17 DIAGNOSIS — I119 Hypertensive heart disease without heart failure: Secondary | ICD-10-CM

## 2013-06-17 NOTE — Progress Notes (Signed)
SUBJECTIVE  Mariah [name] is a 57 year old female, for routine physical exam    Review of Patient's Allergies indicates:   Heparin                     Comment:local rash seen during hospitalization Feb 2008   Lisinopril              Cough      Current Outpatient Prescriptions on File Prior to Visit:  Biotin 1000 MCG TABS Tablet Take 1,000 mcg by mouth daily. Disp:  Rfl:    losartan (COZAAR) 50 MG tablet Take 2 tablets po in the morning Disp: 60 tablet Rfl: 5   hydrochlorothiazide (HYDRODIURIL) 25 MG tablet Take 1 tablet by mouth daily. Disp: 30 tablet Rfl: 2   atorvastatin (LIPITOR) 20 MG tablet Take 1 tablet by mouth daily. Disp: 30 tablet Rfl: 2   omeprazole (PRILOSEC) 20 MG capsule Take 1 capsule by mouth 2 (two) times daily. Disp: 60 capsule Rfl: 5   ibuprofen (ADVIL,MOTRIN) 200 MG tablet Take 200 mg by mouth every 6 (six) hours as needed for Pain. Disp:  Rfl:      No current facility-administered medications on file prior to visit.    Patient Active Problem List:     depressive disorder     Obesity, unspecified     LATERAL FOOT PAIN     Benign hypertensive heart disease without heart failure     Alkaline phosphatase elevation     Insomnia, unspecified     Calculus of kidney     nodules on chest CT 1/06 and 3/07     Gastritis     Allergic rhinitis due to other allergen     ABSENCE OF MENSTRUATION - s/p TAH BSO     Osteoporosis     Murmur heart     Family History of Throat Cancer     Vitamin D Deficiency     Inconclusive mammogram     Subclavian artery stenosis, left     Schatzki's ring     Duodenal ulcer, unspecified as acute or chronic, without hemorrhage, perforation, or obstruction     Severe Chest Pain x 2 Days, Intermittent, Early April 2014, Resolved     Review of symptoms:   No fevers or unexplained weight loss. No visual changes. No sore throat or ear ache.  No prolonged cough. No dyspnea or chest pain on exertion.  No abdominal pain or change in bowel habits.  No vaginal discharge or dysuria.   No new or unusual musculoskeletal symptoms. No new rashes or skin changes.  No pain or masses in breasts. No paresthesias or unusual headaches. No sadness or anxiety that interferes with day-to-day activities. No hot flashes. No enlarged nodes.  No new itching, sneezing or wheezing.      2) HTN  Pt reports that he takes his medications daily. Denies chest pain, SOB, DOE, palpitations, dizziness, head aches, leg edema.   Most Recent BP Reading(s)  06/17/13 : 120/80  05/20/13 : 120/80  04/21/13 : 170/96  03/18/13 : 192/86  01/21/13 : 180/84    OBJECTIVE:  BP 120/80   Temp(Src) 98.1 F (36.7 C) (Oral)   Ht 5\' 4"  (1.626 m)   Wt 209 lb (94.802 kg)   BMI 35.86 kg/m2  General Appearance: The patient appears well, in NAD. Alert, well, developed, well nourished.    HEENT: Head: Normocephalic, atraumatic.   Eyes:  PERRLA, EOMs intact. Conjunctiva, cornea clear.No  discharge.   Ears: No pain with movement of tragus. Ear canals normal. TMs clear bilaterally.   Nose/Sinuses: Patent, scant amount of clear mucus. No maxillary, frontal, ethmoid sinus tenderness.  Oropharynx: Lips, oral mucosa, tongue pink without gingival lesions. Pharynx without lesions, exudate.  Neck: Supple. No adenopathy or thyromegaly.    Respiratory: Bilateral lungs clear to auscultation, good air entry, no wheezes, rhonchi or rales.   Cardiovascular: RRR. S1 and S2 normal, no murmurs, clicks, gallops or rubs. No JVD. No carotid bruit. No edema. Peripheral pulses intact.   GI: Soft,Normal BS present. Abdomen soft without tenderness, guarding, mass or organomegaly.  Skin: Color, texture, turgor normal.  No rash, lesions.   Musculoskeletal: Spine ROM normal. Muscular strength intact.   Neuro: Gait normal. Reflexes normal and symmetric. Sensation grossly normal. CN 2-12 intact.     ASSESSMENT & PLAN:  (V70.0) Encounter for preventive health examination  (primary encounter diagnosis)  Comment: Well woman. Allergies and medications reviewed.   1) preventive  care  -- mammogram: Pt due for repeat this  Month. Information given, she will call to schedule  -- colonoscopy: discussed. Pt defers. Prefers to to iFOB. Information and kit given. Future orders had been added at last visit.  --pap smear: not indicated, pt is s/p total hysterectomy d/t benign fibroid  --flu shot: not indicated  --labwork: Epic orders  --hep B risk: hep b profile done today  -- PHQ-9: not addressed  --health care proxy: not addressed  --counseled about diet, encouraged adequate calcium/vit D intake  --return annually or prn if issues arise    Plan: COLLECTION VENOUS BLOOD VENIPUNCTURE, LIPID         PANEL, THYROID SCREEN TSH REFLEX FT4, HEP B         SURFACE ANTIGEN, HEPATITIS B SURFACE ANTIBODY          (402.10) Benign hypertensive heart disease without heart failure  Comment: well controlled. Continue current medication regimen, low salt diet. Pt encouraged to exercise on a regular basis, watch portions.  Plan:     (790.5) Alkaline phosphatase elevation  Comment: Pt has appt with Dr Lajean Saver next week. He's been following her about this.  Plan:     (535.50) Gastritis  Comment: Continues on omeprazole prn. Avoid NSAID  Plan:       We discussed the patients current medications. The patient expressed understanding and no barriers to adherence were identified.    follow-up will be scheduled prn  she has been advised to call or return with any worsening or new problems

## 2013-06-18 LAB — LIPID PANEL
Cholesterol: 197 mg/dL (ref 0–239)
HIGH DENSITY LIPOPROTEIN: 47 mg/dL (ref 40–60)
LOW DENSITY LIPOPROTEIN DIRECT: 121 mg/dL (ref 0–189)
TRIGLYCERIDES: 233 mg/dL — ABNORMAL HIGH (ref 0–150)

## 2013-06-18 LAB — HEPATITIS B SURFACE ANTIGEN: HEPATITIS B SURFACE ANTIGEN: NONREACTIVE

## 2013-06-18 LAB — THYROID SCREEN TSH REFLEX FT4: THYROID SCREEN TSH REFLEX FT4: 1.99 u[IU]/mL (ref 0.358–3.740)

## 2013-06-18 LAB — HEPATITIS B SURFACE ANTIBODY: HEPATITIS B SURFACE ANTIBODY: NONREACTIVE

## 2013-06-24 ENCOUNTER — Ambulatory Visit (HOSPITAL_BASED_OUTPATIENT_CLINIC_OR_DEPARTMENT_OTHER): Payer: Self-pay | Admitting: Internal Medicine

## 2013-06-25 ENCOUNTER — Ambulatory Visit (HOSPITAL_BASED_OUTPATIENT_CLINIC_OR_DEPARTMENT_OTHER): Payer: PRIVATE HEALTH INSURANCE | Admitting: Orthopaedic Surgery

## 2013-07-16 ENCOUNTER — Ambulatory Visit (HOSPITAL_BASED_OUTPATIENT_CLINIC_OR_DEPARTMENT_OTHER): Payer: PRIVATE HEALTH INSURANCE | Admitting: Orthopaedic Surgery

## 2013-09-08 ENCOUNTER — Ambulatory Visit (HOSPITAL_BASED_OUTPATIENT_CLINIC_OR_DEPARTMENT_OTHER): Payer: PRIVATE HEALTH INSURANCE | Admitting: Internal Medicine

## 2013-09-09 ENCOUNTER — Encounter (HOSPITAL_BASED_OUTPATIENT_CLINIC_OR_DEPARTMENT_OTHER): Payer: Self-pay | Admitting: Internal Medicine

## 2013-09-09 ENCOUNTER — Ambulatory Visit (HOSPITAL_BASED_OUTPATIENT_CLINIC_OR_DEPARTMENT_OTHER): Payer: PRIVATE HEALTH INSURANCE | Admitting: Internal Medicine

## 2013-09-09 VITALS — BP 180/100 | Temp 98.1°F | Ht 64.0 in | Wt 213.0 lb

## 2013-09-09 DIAGNOSIS — R5383 Other fatigue: Secondary | ICD-10-CM

## 2013-09-09 DIAGNOSIS — I771 Stricture of artery: Secondary | ICD-10-CM

## 2013-09-09 DIAGNOSIS — R300 Dysuria: Secondary | ICD-10-CM

## 2013-09-09 DIAGNOSIS — M65341 Trigger finger, right ring finger: Secondary | ICD-10-CM

## 2013-09-09 DIAGNOSIS — I119 Hypertensive heart disease without heart failure: Secondary | ICD-10-CM

## 2013-09-09 DIAGNOSIS — R109 Unspecified abdominal pain: Secondary | ICD-10-CM

## 2013-09-09 DIAGNOSIS — G4700 Insomnia, unspecified: Secondary | ICD-10-CM

## 2013-09-09 LAB — URINE DIP (POINT OF CARE)
BILIRUBIN, URINE: NEGATIVE
GLUCOSE, URINE: NEGATIVE mg/dl
KETONE, URINE: NEGATIVE mg/dl
LEUKOCYTE ESTERASE: NEGATIVE
NITRITE, URINE: NEGATIVE
PH URINE: 5.5 (ref 5.0–8.0)
PROTEIN, URINE: NEGATIVE mg/dl (ref 0–15)
SPECIFIC GRAVITY URINE: 1.02 (ref 1.003–1.030)
UROBILINOGEN URINE: 0.2 mg/dl (ref 0.2–1.0)

## 2013-09-09 MED ORDER — HYDROCHLOROTHIAZIDE 25 MG PO TABS
25.0000 mg | ORAL_TABLET | Freq: Every day | ORAL | Status: DC
Start: 2013-09-09 — End: 2014-01-06

## 2013-09-09 MED ORDER — ATORVASTATIN CALCIUM 20 MG PO TABS
20.0000 mg | ORAL_TABLET | Freq: Every day | ORAL | Status: DC
Start: 2013-09-09 — End: 2014-06-09

## 2013-09-09 MED ORDER — TRAZODONE HCL 50 MG PO TABS
50.0000 mg | ORAL_TABLET | Freq: Every evening | ORAL | Status: DC
Start: 2013-09-09 — End: 2014-01-06

## 2013-09-09 MED ORDER — NITROFURANTOIN MONOHYD MACRO 100 MG PO CAPS
100.00 mg | ORAL_CAPSULE | Freq: Two times a day (BID) | ORAL | Status: AC
Start: 2013-09-09 — End: 2013-09-16

## 2013-09-09 NOTE — Progress Notes (Signed)
SUBJECTIVE  Mariah [name] is a 57 year old female, [place] speaker, with several complaints:    # 1 - Burning with urination, and frequency  x 1 week, not improving  Associated sx: Nocturia, and sensation of incomplete void.  No fever, no flank pain, no previous hx of UTI  No abnormal vaginal discharge, no pain with intercourse    # 2 - lack of energy  Gets home, does not feel like doing nothing once she gets home from work  Not a new problem, has felt this way for several years, but feels that it is worse  Does not think it is related to depression, feels happy, likes to be around people,   Wonders if she needs to take an energy medication  Reports poor sleep:  has very light sleep, wakes up frequently.  Husband turns TV on in the bedroom, once she's asleep. This wakes her up  Husband snores, and this also wakes her up  Uses OTC pm analgesic, initially with good results, but not lately  Exercise: none regularly  Diet: regular meals, mostly home made  Water: moderate amount  Coffee: 1-2 in the morning  Etoh: occasionally  Illicit drugs: denies  Energy drinks: denies    # 3 - Body aches  "My whole body hurts"    # 4 - trigger finger  Right, 4th digit, for several months  Bothering pt to work, and drive    # 5 - halitosis    # 6 - Stomach pain  Woke up last night with severe pain, had to get off bed to take omeprazole  Felt asleep again, still with mild pain this morning, took another omeprazole, and no pain so far.  Had [place] pastries last night, at around 8 pm. [name] to sleep around 10:30 pm.     # 7 HTN  Off HCTZ "a while ago", cant' remember the last time she took it. Denies cp, sob, doe, palpitations  Most Recent BP Reading(s)  09/09/13 : 180/100  06/17/13 : 120/80  05/20/13 : 120/80  04/21/13 : 170/96  03/18/13 : 192/86    Given the time constraints, pt chooses to focus today's visit on # 1 and #2. We will address her other concerns at future visits. She agrees with this plan.    OBJECTIVE:  BP  180/100   Temp(Src) 98.1 F (36.7 C) (Oral)   Ht 5\' 4"  (1.626 m)   Wt 213 lb (96.616 kg)   BMI 36.54 kg/m2  Pleasant, in NAD    Heart: S1 and S2 normal, no murmurs, clicks, gallops or rubs. Regular rate and rhythm.   Lungs:  clear; no wheezes, rhonchi or rales.  CVA: no tenderness  Abd: soft, non tender, no suprapubic pain    ASSESSMENT & PLAN:  (788.1) Dysuria  (primary encounter diagnosis)  Comment: Negative udip. Will send urine for culture, and treat empirically given pts sx  Plan: URINE DIP (POINT OF CARE), URINE CULTURE,         nitrofurantoin, macrocrystal-monohydrate,         (MACROBID) 100 MG capsule            (780.52) Insomnia  Comment: Sleep hygiene discussed: keep regular sleep schedule;  Avoid forcing sleep; Avoid stimulants during the day; Avoid screen time 2h before going to bed;  Avoid alcohol before bed time; Regular exercise, at least 20 minutes at a time, 4-5 hours before going to bed; avoid going to bed hungry; adjust bedroom environment;  deal with worries before bed time  Plan: traZODone (DESYREL) 50 MG tablet         (780.79) Lack of energy  Comment: Likely related to poor sleep. Her sx do not appear to be related to depression, although she did not want to fill out a PHQ-9.  Plan: 10 min discussion re: sleep hygiene (as above), and life style    (727.03) Trigger ring finger of right hand  Comment:   Plan: REFERRAL TO ORTHOPEDICS ( INT)            (536.8) Stomach pain  Comment: Pt has hx hiatal hernia, and chronic gastritis. Has intermittent episodes of stomach pain. Recent episode likely related to eating fatty [place] pastries at night, right prior eating  Plan:  Omeprazole prn  Discussed avoidance of irritating substances, like alcohol, spicy foods, fat/fried food, caffeine, tomato, chocolate. Discussed avoiding large meals, especially at night. Rather, do small frequent meals. Discussed waiting 1-2 hours after eating to lay down. Discussed elevating head of bed      (440.8) Subclavian  artery stenosis, left  Comment: refill  Plan: atorvastatin (LIPITOR) 20 MG tablet            (402.10) Benign hypertensive heart disease without heart failure  Comment: Elevated today, pt off HCTZ for more than one month  Restart , recheck BP in the next 2 weeks. Pt left in a hurry, she said she is going to call to schedule this.  Plan: hydrochlorothiazide (HYDRODIURIL) 25 MG tablet       HM  Mammogram - unclear when she's due for repeat. Most recent mammogram dated January/14 BIRADS 2 benign recommend annual screening mammogram in 6 months   Will email radiologist for clarification of appropriate FU    We discussed the patients current medications. The patient expressed understanding and no barriers to adherence were identified.    follow-up will be scheduled 1-2 wks for BP recheck  she has been advised to call or return with any worsening or new problems

## 2013-09-09 NOTE — Patient Instructions (Addendum)
--   Para o dedo - vamos fazer um encaminhamento para a Theatre manager.    -- Para a urina, tomar o antibiotico, duas vezes ao dia (manha e noite) por uma semana    -- Para o sono:   -- TIRAR A TELEVISAO DO QUARTO   -- Rotina calma antes de dormir  -- Tomar o trazodone, 50 mg, antes de dormir. Pode tomar dois comprimidos se for necessario.   -- Nao tomar cafe depois das 3 da tarde   -- fazer atividade fisica regularmente

## 2013-09-11 LAB — URINE CULTURE

## 2013-10-02 ENCOUNTER — Ambulatory Visit (HOSPITAL_BASED_OUTPATIENT_CLINIC_OR_DEPARTMENT_OTHER): Payer: PRIVATE HEALTH INSURANCE | Admitting: Hand Surgery

## 2013-10-02 VITALS — BP 160/93 | HR 76 | Temp 98.2°F | Resp 20 | Ht 64.0 in | Wt 213.0 lb

## 2013-10-02 DIAGNOSIS — M65341 Trigger finger, right ring finger: Secondary | ICD-10-CM

## 2013-10-02 MED ORDER — LIDOCAINE HCL 1 % IJ SOLN
0.50 mL | Freq: Once | INTRAMUSCULAR | Status: AC
Start: 2013-10-02 — End: 2013-10-02

## 2013-10-02 MED ORDER — METHYLPREDNISOLONE ACETATE 20 MG/ML IJ SUSP
20.00 mg | Freq: Once | INTRAMUSCULAR | Status: AC
Start: 2013-10-02 — End: 2013-10-02

## 2013-10-02 NOTE — Patient Instructions (Signed)
Take tylenol or motrin tonight because as the

## 2013-10-02 NOTE — Progress Notes (Addendum)
Date of Service: 10/02/2013    CHIEF COMPLAINT:  Trigger finger, right ring finger.    HISTORY OF PRESENT ILLNESS:  Mariah [name] is [place] speaking.  She was interviewed with the use of a phone interpreter.  She is 57 years old.  She has a 1-year history of right ring trigger finger.    PAST MEDICAL HISTORY:  These are listed in Epic, and in brief:  Back pain, obesity, anxiety, history of tobacco use, hyperparathyroidism, vitamin D deficiency, gastritis, diarrhea.    MEDICATIONS:  Again, these are listed in Epic, and in brief:  Trazodone, Lipitor, hydrochlorothiazide, Advil, biotin.    PHYSICAL EXAMINATION:    GENERAL:  The patient is alert and oriented.    VITAL SIGNS:  She is 5 feet 4 inches, 213 pounds.  Her BMI is 36.  HEENT:  Normocephalic, atraumatic.  CARDIOVASCULAR:  Regular rate and rhythm.    MUSCULOSKELETAL:  Right upper extremity neurovascularly intact.  There is a clicking and palpable node over the volar surface of the MP joint of the right ring finger, all consistent with a trigger finger.    PROCEDURE:  The patient was very apprehensive; however, did consent to a steroid injection.  After the skin was properly cleaned, a standard combination of Depo-Medrol and Marcaine was injected at the site of the trigger finger tendon A1 pulley.    ASSESSMENT AND PLAN:  Mariah [name] is 56 with a 1-year history of a trigger finger of the right ring finger.  She did receive a steroid injection today that was done by Dr Olam Idler.  She will contact us in 3 weeks if she continues symptomatic.  She was very tearful during the injection, but very grateful once it was all over.    Dr Olam Idler saw and examined the patient, discussed options including injection versus surgery, and, as noted, did perform the injection.    I, Dr. Michela Pitcher, examined this patient and formulated the plan of care.  ___________________________  Reviewed and Electronically Signed By: Resa Miner MD  Sig Date: 09/21/2014  Sig Time:  17:51:25  Dictated By: Salvadore Dom PA-C  Dict Date: 10/02/2013 Dict Time: 05 30 PM    Dictation Date and Time:10/02/2013 17:30:31  Transcription Date and Time:10/02/2013 17:54:34  eScription Dictation id: [id number] Confirmation # :[id number]

## 2013-10-02 NOTE — Progress Notes (Signed)
The primary progress note for this visit has been dictated through E-Scription. It can be viewed as an attachment to this encounter or through Chart Review under the Notes Tab as an Orthopedic Office Note.      Review of Systems: Constitutional, Eyes, ENT/Mouth, Cardiovascular, Respiratory, GI, GU, Neuro, Psych, Heme/Lymph, Skin, Musculoskeletal was reviewed and is NEGATIVE except for what is dictated in the note.    MT ACCT #:  [id number]

## 2013-10-09 ENCOUNTER — Ambulatory Visit (HOSPITAL_BASED_OUTPATIENT_CLINIC_OR_DEPARTMENT_OTHER): Payer: PRIVATE HEALTH INSURANCE | Admitting: Gastroenterology

## 2013-10-09 ENCOUNTER — Encounter (HOSPITAL_BASED_OUTPATIENT_CLINIC_OR_DEPARTMENT_OTHER): Payer: Self-pay | Admitting: Gastroenterology

## 2013-10-09 VITALS — BP 129/79 | HR 74 | Temp 98.2°F | Resp 14 | Ht 63.0 in | Wt 211.0 lb

## 2013-10-09 DIAGNOSIS — R748 Abnormal levels of other serum enzymes: Secondary | ICD-10-CM

## 2013-10-09 LAB — HEPATIC FUNCTION PANEL
ALANINE AMINOTRANSFERASE: 33 U/L (ref 12–45)
ALBUMIN: 3.9 g/dL (ref 3.4–5.0)
ALKALINE PHOSPHATASE: 173 U/L — ABNORMAL HIGH (ref 45–117)
ASPARTATE AMINOTRANSFERASE: 20 U/L (ref 8–34)
BILIRUBIN DIRECT: 0.1 mg/dl (ref 0.0–0.2)
BILIRUBIN TOTAL: 0.2 mg/dL (ref 0.2–1.0)
INDIRECT BILIRUBIN: 0.1 mg/dL — ABNORMAL LOW (ref 0.2–0.9)
TOTAL PROTEIN: 7.7 g/dL (ref 6.4–8.2)

## 2013-10-09 NOTE — Progress Notes (Signed)
This office note has been dictated. Account number [id number]    Primary note dictated through E-Scription.  See "Notes" tab in Chart Review for transcribed note; final note also appears within encounter.    I have spent  minutes in face to face time with this patient/patient proxy of which > 50% was in counseling or coordination of care regarding above issues/Dx.      Patient understands and agrees with the plan

## 2013-10-10 NOTE — Progress Notes (Signed)
Date of Service: 10/09/2013    HISTORY OF PRESENT ILLNESS:  [name] was seen in followup for her dyspepsia and abnormal LFTs.    The patient was last seen in January of this year, when she had elevation of the alkaline phosphatase, as well as the 5 prime nucleotidase, which ascertained that the alkaline phosphatase elevation was of liver origin.    At that time, I requested repeat liver function tests, as well as an antimitochondrial antibody.  The repeat liver function test again showed an elevation of the alkaline phosphatase, and the antimitochondrial antibody result was equivocal, but not negative.  I had requested her to return in 1 month's time, but she canceled several appointments, and showed up only today.    She does not have any complaints of excessive fatigue or itching, and she denies right upper quadrant discomfort or dark urine.    PHYSICAL EXAMINATION:  GENERAL:  She is healthy looking and in no distress.  VITAL SIGNS:  Blood pressure 129/79, pulse 74, temperature 98.2, oxygen saturation 99%, and weight 211 pounds.   HEAD AND ENT:  She has no icterus or pallor.  She has no cutaneous manifestations of chronic liver disease.  ABDOMEN:  Shows just palpable liver that is soft, but no splenomegaly or ascites.  EXTREMITIES:  She has no leg edema.    ASSESSMENT AND PLAN:  I told the patient that she may have a condition referred to as a primary biliary cirrhosis, but I would like to observe her for a few months, and repeat the blood tests before committing her to long-term treatment, which is not very effective anyway.    ___________________________  Reviewed and Electronically Signed By: Marrianne Mood MD  Sig Date: 10/22/2013  Sig Time: 14:32:33  Dictated By: Marrianne Mood MD  Dict Date: 10/10/2013 Dict Time: 01 46 PM    Dictation Date and Time:10/10/2013 13:46:45  Transcription Date and Time:10/10/2013 16:30:48  eScription Dictation id: [id number] Confirmation # :[id number]      cc: Florestine Avers APRN

## 2013-10-22 ENCOUNTER — Encounter (HOSPITAL_BASED_OUTPATIENT_CLINIC_OR_DEPARTMENT_OTHER): Payer: Self-pay | Admitting: Internal Medicine

## 2013-11-18 ENCOUNTER — Ambulatory Visit (HOSPITAL_BASED_OUTPATIENT_CLINIC_OR_DEPARTMENT_OTHER): Payer: PRIVATE HEALTH INSURANCE | Admitting: Internal Medicine

## 2013-11-28 ENCOUNTER — Ambulatory Visit (HOSPITAL_BASED_OUTPATIENT_CLINIC_OR_DEPARTMENT_OTHER): Payer: PRIVATE HEALTH INSURANCE | Admitting: Physician Assistant

## 2013-12-23 ENCOUNTER — Ambulatory Visit (HOSPITAL_BASED_OUTPATIENT_CLINIC_OR_DEPARTMENT_OTHER): Payer: Self-pay | Admitting: Internal Medicine

## 2013-12-25 LAB — MA SCREENING MAMMO BILATERAL WITH CAD

## 2014-01-06 ENCOUNTER — Ambulatory Visit (HOSPITAL_BASED_OUTPATIENT_CLINIC_OR_DEPARTMENT_OTHER): Payer: PRIVATE HEALTH INSURANCE | Admitting: Internal Medicine

## 2014-01-06 VITALS — BP 150/69 | HR 80 | Temp 98.0°F | Ht 63.0 in | Wt 197.0 lb

## 2014-01-06 DIAGNOSIS — G4700 Insomnia, unspecified: Secondary | ICD-10-CM

## 2014-01-06 DIAGNOSIS — E669 Obesity, unspecified: Secondary | ICD-10-CM

## 2014-01-06 DIAGNOSIS — I119 Hypertensive heart disease without heart failure: Secondary | ICD-10-CM

## 2014-01-06 DIAGNOSIS — M549 Dorsalgia, unspecified: Secondary | ICD-10-CM

## 2014-01-06 MED ORDER — CYCLOBENZAPRINE HCL 5 MG PO TABS
5.00 mg | ORAL_TABLET | Freq: Two times a day (BID) | ORAL | Status: AC | PRN
Start: 2014-01-06 — End: 2014-02-05

## 2014-01-06 MED ORDER — TRAZODONE HCL 50 MG PO TABS
50.0000 mg | ORAL_TABLET | Freq: Every evening | ORAL | Status: DC
Start: 2014-01-06 — End: 2014-03-19

## 2014-01-06 MED ORDER — HYDROCHLOROTHIAZIDE 25 MG PO TABS
25.0000 mg | ORAL_TABLET | Freq: Every day | ORAL | Status: DC
Start: 2014-01-06 — End: 2014-04-08

## 2014-01-06 MED ORDER — LOSARTAN POTASSIUM 50 MG PO TABS
ORAL_TABLET | ORAL | Status: DC
Start: 2014-01-06 — End: 2014-07-06

## 2014-01-06 NOTE — Progress Notes (Signed)
SUBJECTIVE  Mariah [name] is a 58 year old female, [place] speaker, for FU    -- HTN  Pt reports that he takes his medications daily. Denies chest pain, SOB, DOE, palpitations, dizziness, head aches, leg edema.   Most Recent BP Reading(s)  01/06/14 : 150/69  10/09/13 : 129/79  10/02/13 : 160/93  09/09/13 : 180/100  06/17/13 : 120/80    -- Mammography - wnl, results reviewed, repeat in one year    -- Trigger finger - resolved after cs inj Oct/14, Dr Joylene John    -- Alk Phosph - has FU appt with Adele Schilder on 02/05/14, she will come for labs prior to visit    -- main complaint:  Woke up Saturday (3 days ago) with middle upper back pain, reflecting to the chest, that lasted for 2 days  Took several medications (from [place] and [place]), and eventually the pain resolved. Does not know the name of medications "I took what I could find, something worked!"  Currently has Lumbar pain since yesterday, no paresthesias  Works as Electrical engineer, repetitive motion, drives for long stretches of time, no regular exercise    -- obesity  Saw an out-of-state provider, who has prescribed adipex-9 37.5 mg once a day  Pt says it has been helpful in decreasing the anxiety related to food, and she's been better able to control her craving for food. Pt says that she's already lost weight, and feels very happy with the results.    Most Recent Weight Reading(s)  01/06/14 : 197 lb (89.359 kg)  10/09/13 : 211 lb (95.709 kg)  10/02/13 : 213 lb (96.616 kg)  09/09/13 : 213 lb (96.616 kg)  06/17/13 : 209 lb (94.802 kg)    OBJECTIVE:  BP 150/69   Pulse 80   Temp(Src) 98 F (36.7 C)   Ht $R'5\' 3"'CK$  (1.6 m)   Wt 197 lb (89.359 kg)   BMI 34.91 kg/m2   SpO2 100%  Pleasant, in NAD    Heart: S1 and S2 normal, no murmurs, clicks, gallops or rubs. Regular rate and rhythm.   Lungs:  clear; no wheezes, rhonchi or rales.  Back: On inspection, back is symmetric. There are no obvious deformities, erythema or edema. There is no tenderness to palpation over the  cervical spine. There is  tenderness to palpation on thoracic paravertebral muscles. There is mild tenderness to palpation on lumbar region. She has full range of motion on flexion, extension, and rotation. No abnormal gait    ASSESSMENT & PLAN:  (402.10) Benign hypertensive heart disease without heart failure  (primary encounter diagnosis)  Comment: elevated, pt has not taken medication today  -- Take medications daily  -- Low sodium diet  -- Regular physical activity, weight management    Plan: hydrochlorothiazide (HYDRODIURIL) 25 MG tablet,        losartan (COZAAR) 50 MG tablet            (724.5) Backache, unspecified  Comment: Musculoskeletal, Pt encouraged to enroll in regular program of stretching, strengthening core, like Pilates, yoga, or aqua aerobics. Pt encouraged not to take whatever medications she finds in the house for pain, rather, use one medication at a time to make sure she's taking more than the recommended dose for any ingredient. We reviewed indications of most common analgesics, like ibuprofen and acetaminophen. Pt verbalizes understanding.  -- Warm compresses.  Plan: cyclobenzaprine (FLEXERIL) 5 MG tablet            (780.52) Insomnia  Comment: refill  Plan: traZODone (DESYREL) 50 MG tablet            (278.00) Obesity, unspecified  Comment: Pt started taking phentermine (appetite suppressant). Prescribed by  out of state provider. She's lost 14 lbs since October/14.   Risk profile of phentermine reviewed, not recommended for patient with CV disease, including HTN.   Pt verbalizes understanding, but says that it has been helpful to lose weight, and she finds this a positive effect. She prefers to continue taking medication.    Plan: Pt encouraged to seek alternate ways for weight management, like portion control, stress management, and regular physical activity.     The patient was ready to learn and no apparent learning or adherence barriers were identified. I explained the diagnosis and  treatment plan, and the patient expressed understanding of the content. I attempted to answer any questions regarding the diagnosis and the proposed treatment.    We discussed the patients current medications.  We discussed the importance of medication compliance. The patient expressed understanding and no barriers to adherence were identified.    follow-up will be scheduled 4 wks  she has been advised to call or return with any worsening or new problems

## 2014-03-03 ENCOUNTER — Ambulatory Visit (HOSPITAL_BASED_OUTPATIENT_CLINIC_OR_DEPARTMENT_OTHER): Payer: PRIVATE HEALTH INSURANCE | Admitting: Internal Medicine

## 2014-03-19 ENCOUNTER — Other Ambulatory Visit (HOSPITAL_BASED_OUTPATIENT_CLINIC_OR_DEPARTMENT_OTHER): Payer: Self-pay | Admitting: Internal Medicine

## 2014-03-19 DIAGNOSIS — K269 Duodenal ulcer, unspecified as acute or chronic, without hemorrhage or perforation: Secondary | ICD-10-CM

## 2014-03-19 DIAGNOSIS — G4700 Insomnia, unspecified: Secondary | ICD-10-CM

## 2014-03-19 MED ORDER — OMEPRAZOLE 20 MG PO CPDR
20.0000 mg | DELAYED_RELEASE_CAPSULE | Freq: Two times a day (BID) | ORAL | Status: DC
Start: 2014-03-19 — End: 2015-01-14

## 2014-03-19 MED ORDER — TRAZODONE HCL 50 MG PO TABS
50.0000 mg | ORAL_TABLET | Freq: Every evening | ORAL | Status: DC
Start: 2014-03-19 — End: 2014-08-27

## 2014-03-19 NOTE — Progress Notes (Signed)
PER Patient (self), Mariah [name] is a 58 year old female has requested a refill of omeprazole, trazodone.      Last Office Visit: 01-06-14  Last Physical Exam: 06-17-13      Other Med Adult:  Most Recent BP Reading(s)  01/06/14 : 150/69        Cholesterol (mg/dL)   Date  Value    06/17/2013  197    ----------    LDL DIRECT (mg/dL)   Date  Value    06/17/2013  121    ----------    HDL (mg/dL)   Date  Value    06/17/2013  47    ----------    TRIGLYCERIDES (mg/dL)   Date  Value    06/17/2013  233*   ----------        THYROID SCREEN TSH REFLEX FT4 (uIU/mL)   Date  Value    06/17/2013  1.990    ----------        TSH (THYROID STIM HORMONE) (uIU/mL)   Date  Value    04/03/2012  1.68    ----------      No results found for this basename: [id number]        INR (no units)   Date  Value    01/18/2007  1.0*   ----------       Documented patient preferred pharmacies:    [facility] ([facility])  Phone: [phone] Fax: [phone]

## 2014-03-19 NOTE — Progress Notes (Signed)
[facility]    Person calling on behalf of patient: Patient (self)    May list multiple medications in this section    Medicine Name: omeprazole (PRILOSEC) 20 MG capsule / traZODone (DESYREL) 50 MG tablet    Dosage: 20 MG capsule /  50 MG tablet    Frequency (how many pills, how many times a day): Take 1 capsule by mouth 2 (two) times daily. - Oral /  Take 1 tablet by mouth nightly. - Oral      Number of pills left: NONE    Documented patient preferred pharmacies:   [facility] ([facility])  Phone: [phone] Fax: [phone]    CALL BACK NUMBER:     Cell phone:      Other phone:     Available times:     Patient's language of care: [place]    Patient needs a [place] interpreter.

## 2014-04-07 ENCOUNTER — Ambulatory Visit (HOSPITAL_BASED_OUTPATIENT_CLINIC_OR_DEPARTMENT_OTHER): Payer: PRIVATE HEALTH INSURANCE | Admitting: Internal Medicine

## 2014-04-11 ENCOUNTER — Emergency Department (HOSPITAL_BASED_OUTPATIENT_CLINIC_OR_DEPARTMENT_OTHER)
Admission: RE | Admit: 2014-04-11 | Disposition: A | Discharge status: Home / Self Care | Payer: Self-pay | Source: Emergency Department | Attending: Emergency Medicine | Admitting: Emergency Medicine

## 2014-04-11 LAB — POC URINALYSIS
BILIRUBIN, URINE: NEGATIVE
GLUCOSE,URINE: NEGATIVE
KETONE, URINE: NEGATIVE
NITRITE, URINE: POSITIVE — AB
PH URINE: 6 (ref 5.0–8.0)
SPECIFIC GRAVITY, URINE: 1.025 (ref 1.003–1.030)
UROBILINOGEN URINE: 0.2 (ref 0.2–1.0)

## 2014-04-11 LAB — URINALYSIS
BACTERIA: >50 PER HPF — AB (ref 0–5)
BILIRUBIN, URINE: NEGATIVE
CASTS: NONE SEEN PER LPF
CRYSTALS: NONE SEEN
GLUCOSE, URINE: NEGATIVE MG/DL
KETONE, URINE: NEGATIVE MG/DL
NITRITE, URINE: POSITIVE — AB
PH URINE: 6 (ref 5.0–8.0)
PROTEIN, URINE: NEGATIVE MG/DL
SPECIFIC GRAVITY URINE: 1.025 (ref 1.003–1.035)
SQUAMOUS EPITHELIAL CELLS: >10 PER LPF — AB (ref 0–4)

## 2014-04-11 LAB — XR LUMBAR SPINE 2 OR 3 VIEWS

## 2014-04-11 MED ORDER — OXYCODONE HCL 5 MG PO TABS
5.00 mg | ORAL_TABLET | Freq: Once | ORAL | Status: AC
Start: 2014-04-11 — End: 2014-04-11
  Administered 2014-04-11: 5 mg via ORAL
  Filled 2014-04-11: qty 1

## 2014-04-11 MED ORDER — LIDOCAINE 5 % EX PTCH
1.00 | MEDICATED_PATCH | CUTANEOUS | Status: AC
Start: 2014-04-11 — End: 2014-04-18

## 2014-04-11 MED ORDER — CIPROFLOXACIN HCL 500 MG PO TABS
500.00 mg | ORAL_TABLET | Freq: Two times a day (BID) | ORAL | Status: AC
Start: 2014-04-11 — End: 2014-04-14

## 2014-04-11 MED ORDER — DIAZEPAM 5 MG PO TABS
5.00 mg | ORAL_TABLET | Freq: Once | ORAL | Status: AC
Start: 2014-04-11 — End: 2014-04-11
  Administered 2014-04-11: 5 mg via ORAL
  Filled 2014-04-11: qty 1

## 2014-04-11 MED ORDER — DIAZEPAM 5 MG PO TABS
5.0000 mg | ORAL_TABLET | Freq: Three times a day (TID) | ORAL | Status: AC | PRN
Start: 2014-04-11 — End: 2014-04-15

## 2014-04-11 MED ORDER — IBUPROFEN 800 MG PO TABS
800.0000 mg | ORAL_TABLET | Freq: Three times a day (TID) | ORAL | Status: AC | PRN
Start: 2014-04-11 — End: 2014-04-18

## 2014-04-11 NOTE — ED Provider Notes (Signed)
The patient was seen primarily by me. ED nursing record was reviewed. Prior records as available electronically through the Epic record were reviewed.    History, physical exam and disposition were conducted with an official hospital [place] interpreter.    HPI:    This 58 year old female patient with past medical history anxiety, depression, obesity, prior history of low back pain presents to the emergency department complaining of 5 days of low back pain.  Does not recall any trauma or other inciting event.  Pain is described as cramping, also associated with pain in the sides of both legs.  Patient also notes some burning with urination over the past 5 days.  No hematuria.  No abdominal pain.  Denies any leg numbness or weakness.  Denies any saddle anesthesia.  No fevers.  No history of IV drug use.  No relief at home with ibuprofen, aspirin (400 milligrams yesterday), and a medication from [place] that includes nonsteroidal anti-inflammatory drug, Tylenol, and caffeine.  Denies any other acute recent illnesses.      ROS: Pertinent positives were reviewed as per the HPI above. All other systems were reviewed and are negative.      Past Medical History/Problem list:    Past Medical History    Screening for hypertension 08/05/2004    Comment: occ elevated BP started on anti-HTN in [place]; chem 7, EKG, HCT u/a wnl 4/03; d/c'ed HCTZ 8/05 when we learned from daughter she has not been taking it    ANXIETY DEPRESSION 08/05/2004    Comment: TSH wnl 8/02; trial of fluoxetine 8/05    PERS HX TOBACCO USE 08/05/2004    Comment: quit 10/02; 1/2 PPD since 58 YO    BACK PAIN LOW 08/05/2004    Comment: LBP musculoskeletal most likely 1/04    OBESITY NOS 08/05/2004    Comment: has tried meds from [place]    PERS HX OF PAST NONCOMPLIANCE 08/05/2004    Comment: poor compliance to med regimen    Hyperparathyroidism 10/22/2006    Comment: osteoporosis found - pt referred to surgery for the hyperparathyroidism- s/p parathyroidectomy  feb 2008    Vitamin D deficiency 05/19/2008    Comment: Supplement per dr Jake Samples.    Diarrhea 03/27/2006    Comment: 7/07 The colonoscopy was visually normal and biopsies taken to rule out microscopic colitis were negative.  A biopsy of the descending duodenum to rule out celiac sprue because of the diarrhea was negative.      ABN FIND-STOOL CONTENTS-OCC BLOOD 03/27/2006    Comment: s/p colonoscopy and EGD 7/07    Cough 04/23/2006    Comment: s/p PFTs September 2007 --  some obstruction at mid to low lung volumes not correctable by bronchodilator CT scan showed stable pulmonary nodules over several years 11/07 - cough gone ** note combined with dx of "nodules on CT scan". Will file this note to hx.       ABN FIND-STOOL CONTENTS-OCC BLOOD 03/27/2006    Comment: s/p colonoscopy and EGD 7/07    Cough 04/23/2006    Comment: s/p PFTs September 2007 --  some obstruction at mid to low lung volumes not correctable by bronchodilator CT scan showed stable pulmonary nodules over several years 11/07 - cough gone ** note combined with dx of "nodules on CT scan". Will file this note to hx.       Diarrhea 03/27/2006    Comment: 7/07 The colonoscopy was visually normal and biopsies taken to rule out microscopic colitis were  negative.  A biopsy of the descending duodenum to rule out celiac sprue because of the diarrhea was negative.      Gastritis 12/19/2012    Comment: Combined with entry "esophageal reflux       Patient Active Problem List:     depressive disorder     Obesity, unspecified     LATERAL FOOT PAIN     Benign hypertensive heart disease without heart failure     Alkaline phosphatase elevation     Insomnia, unspecified     Calculus of kidney     nodules on chest CT 1/06 and 3/07     Gastritis     Allergic rhinitis due to other allergen     ABSENCE OF MENSTRUATION - s/p TAH BSO     Osteoporosis     Murmur heart     Family History of Throat Cancer     Vitamin D Deficiency     Inconclusive mammogram     Subclavian artery stenosis,  left     Schatzki's ring     Duodenal ulcer, unspecified as acute or chronic, without hemorrhage, perforation, or obstruction     Severe Chest Pain x 2 Days, Intermittent, Early April 2014, Resolved         Past Surgical History:     Past Surgical History    TOTAL ABDOMINAL HYSTERECT W/WO RMVL TUBE OVARY  5/00    Comment Hysterectomy, Total Abdominal secondary to fibroids    SALPINGO-OOPHORECTOMY COMPL/PRTL UNI/BI SPX  5/00    Comment Salpingo-Oophorectomy, bilateral    PARATHYROIDECTOMY  2006    Comment partial,     TONSILLECTOMY ONE-HALF          Medications:   Current Facility-Administered Medications:  oxyCODONE (ROXICODONE) IR tablet 5 mg 5 mg Oral Once Isam Unrein M. Reene Harlacher   diazepam (VALIUM) tablet 5 mg 5 mg Oral Once Harrie Foreman     Current Outpatient Prescriptions:  traZODone (DESYREL) 50 MG tablet Take 1 tablet by mouth nightly. Disp: 30 tablet Rfl: 5   omeprazole (PRILOSEC) 20 MG capsule Take 1 capsule by mouth 2 (two) times daily. Disp: 60 capsule Rfl: 11   phentermine (ADIPEX-P) 37.5 MG tablet Take 37.5 mg by mouth every morning before breakfast. Indications: for weight loss, prescribed by out of state provider Disp:  Rfl:    losartan (COZAAR) 50 MG tablet Take 2 tablets po in the morning Disp: 60 tablet Rfl: 5   atorvastatin (LIPITOR) 20 MG tablet Take 1 tablet by mouth daily. Disp: 30 tablet Rfl: 2   ibuprofen (ADVIL,MOTRIN) 200 MG tablet Take 200 mg by mouth every 6 (six) hours as needed for Pain. Disp:  Rfl:    Biotin 1000 MCG TABS Tablet Take 1,000 mcg by mouth daily. Disp:  Rfl:        Social History:   Social History   Marital Status: Legally Separated  Spouse Name: N/A    Years of Education: N/A  Number of Children: N/A     Occupational History  None on file     Social History Main Topics   Smoking status: Former Smoker  0.50 Packs/Day  For 40.00 Years     Types: Cigarettes    Quit date: 03/31/2012    Smokeless tobacco: Former Systems developer    Comment: 3-4 cigarretes a day. Quit about 1 month ago     Alcohol Use: Yes    Comment: very occasional    Drug Use: No    Sexual Activity:  Yes    Partners: Male    Comment: histerectomy     Other Topics Concern   None on file     Social History Narrative    work: Engineer, building services; owned bakery in [place]    Lives w/ daughter 24 YO (Grazielle [place]) & son-in-law Lowella Dandy)    Divorced; currently not in a relationship     The patient lives at home..   Tobacco: Former  EtOH: No  Street drugs: No    Family History: Noncontributory    Allergies:  Review of Patient's Allergies indicates:   Heparin                     Comment:local rash seen during hospitalization Feb 2008   Lisinopril              Cough    Physical Exam:  Patient Vitals for the past 24 hrs:   BP Temp Pulse Resp SpO2 Weight   04/11/14 0855 164/97 mmHg 97.7 F 81 20 100 % 72.576 kg       GENERAL:  WDWN, uncomfortable appearing, non-toxic   SKIN:  Warm & Dry, no rash, no petechia.  HEAD:  NCAT. Sclerae are anicteric and aninjected. Airway is patent. Oropharynx is clear with moist mucous membranes. Posterior pharynx without erythema, edema, or exudate. PERRL.  NECK:  No meningismus. No stridor.  LUNGS:  Clear to auscultation bilaterally. No wheezes, rales, rhonchi.   HEART:  RRR. Normal S1 and S2. No murmurs, rubs, or gallops. 2+radial pulses bilaterally.  ABDOMEN:  Soft, NTND.  No masses.  No involuntary guarding or rebound.   EXTREMITIES:  No obvious deformities.  Warm and well perfused.  No calf tenderness. No cyanosis, clubbing, or edema.   Back: Moderate tenderness to palpation over the mid L-spine and bilateral paraspinous muscles in that area, left greater than right.  No crepitus or step-offs.  NEUROLOGIC:  Alert and oriented x4; moves all extremities well; speaking in clear fluent sentences.  Diffusely decreased sensation to light touch and the left leg in a nondermatomal pattern.  Normal strength bilateral lower extremities.  2+ knee and ankle jerks bilaterally.  Straight leg raise negative  bilaterally.  PSYCHIATRIC:  Appropriate for age, time of day, and situation    ED Course and Medical Decision-making:  58 year old female patient presenting to the emergency Department 5 days of low back pain as well as burning with urination, not responsive to home medications.  No other acute infectious symptoms, no focal neurologic complaints.    Vital signs notable for elevated blood pressure 164/97, otherwise unremarkable.  Physical exam notable for diffuse tenderness to palpation over the lower L-spine and bilateral paraspinous muscles, left greater than right.  Abdominal exam benign.  Normal neurologic exam of the bilateral extremities.    Given the patient's age, L-spine films were obtained.  These were unremarkable.  Urinalysis was obtained as well, and was positive for nitrites and leukocyte esterase, as well as was a small amount of white and red blood cells.  A urine culture was sent.  Given the patient's burning with urination, this was felt to be consistent with likely cystitis.    While in the emergency department, the patient was treated symptomatically with a dose of oxycodone as well as Valium, with improvement in her pain.  She was discharged home with prescriptions for high-dose ibuprofen, Valium for muscle relaxation, as well as a lidocaine patch.  She was also given a prescription for a  three-day course of Cipro to treat her urinary tract infection.  She was advised to follow up with her primary care provider for persistent symptoms, and that a referral to physical therapy may be helpful.    Reasons to return to the ED were reviewed in detail. The patient agrees with this plan and disposition.      Condition on Discharge: Improved and Stable    Diagnosis/Diagnoses:  Low back pain, urinary tract infection      Michel Santee, MD        This Emergency Department patient encounter note was created using voice-recognition software and in real time during the ED visit. Please excuse any  typographical errors that have not been edited out.

## 2014-04-11 NOTE — ED Notes (Signed)
Pt reassured medication will help pain

## 2014-04-11 NOTE — Discharge Instructions (Signed)
Your x-rays were normal.    Your urine showed a mild urinary tract infection.    Do the following at home:    - Take ibuprofen, 800 milligrams, every 8 hours as needed for pain and fever.  You may also take Tylenol, 2 extra strength or 3 regular strength tablets, every 6 hours as needed for pain and fever.    **Do not take more than 4 doses of Tylenol within 24 hours as more will poison your liver.**    (This includes other over-the-counter or prescription medications that contain Tylenol such as cold or migraine medications and Vicodin, Percocet, Tylenol 3, etc..)  - Take the Valium every 8 hours as needed for muscle spasm.  - You may apply the lidocaine patch directly over the painful area once a day.  - Take the full course of Cipro (antibiotic) to treat the urinary tract infection.  - Please make arrangements to followup with her regular doctor, call on Monday for an appointment.    Return to the emergency department for uncontrolled pain, fevers, not able to urinate, problems with her bowel movements, no leg numbness or weakness, numbness when you wipe yourself after using the bathroom, any other concerns.

## 2014-04-11 NOTE — ED Notes (Signed)
Pt did state she was voiding in small amounts

## 2014-04-11 NOTE — ED Notes (Signed)
TO XRAY

## 2014-04-11 NOTE — ED Notes (Signed)
Pt crying with discomfort

## 2014-04-11 NOTE — ED Triage Note (Signed)
Back pain started 2 days ago standing from bending  Too Tandriflan 50 mg for pain 4 hours ago with little effect

## 2014-04-13 ENCOUNTER — Emergency Department (HOSPITAL_BASED_OUTPATIENT_CLINIC_OR_DEPARTMENT_OTHER)
Admission: RE | Admit: 2014-04-13 | Disposition: A | Discharge status: Home / Self Care | Payer: Self-pay | Source: Emergency Department | Attending: Physician Assistant | Admitting: Physician Assistant

## 2014-04-13 ENCOUNTER — Encounter (HOSPITAL_BASED_OUTPATIENT_CLINIC_OR_DEPARTMENT_OTHER): Payer: Self-pay

## 2014-04-13 LAB — COMPREHENSIVE METABOLIC PANEL
ALANINE AMINOTRANSFERASE: 67 U/L — ABNORMAL HIGH (ref 12–45)
ALBUMIN: 3.8 g/dL (ref 3.4–5.0)
ALKALINE PHOSPHATASE: 207 U/L — ABNORMAL HIGH (ref 45–117)
ANION GAP: 8 mmol/L (ref 5–15)
ASPARTATE AMINOTRANSFERASE: 48 U/L — ABNORMAL HIGH (ref 8–34)
BILIRUBIN TOTAL: 0.2 mg/dL (ref 0.2–1.0)
BUN (UREA NITROGEN): 17 mg/dL (ref 7–18)
CALCIUM: 9.3 mg/dL (ref 8.5–10.1)
CARBON DIOXIDE: 28 mmol/L (ref 21–32)
CHLORIDE: 105 mmol/L (ref 98–107)
CREATININE: 0.6 mg/dL (ref 0.4–1.2)
ESTIMATED GLOMERULAR FILT RATE: >60 mL/min (ref >60)
Glucose Random: 112 mg/dL (ref 74–160)
POTASSIUM: 3.7 mmol/L (ref 3.5–5.1)
SODIUM: 141 mmol/L (ref 136–145)
TOTAL PROTEIN: 7.6 g/dL (ref 6.4–8.2)

## 2014-04-13 LAB — CBC, PLATELET & DIFFERENTIAL
ABSOLUTE BASO COUNT: 0 10*3/uL (ref 0.0–0.1)
ABSOLUTE EOSINOPHIL COUNT: 0.2 10*3/uL (ref 0.0–0.8)
ABSOLUTE IMM GRAN COUNT: 0.01 10*3/uL (ref 0.00–0.03)
ABSOLUTE LYMPH COUNT: 2.4 10*3/uL (ref 0.6–5.9)
ABSOLUTE MONO COUNT: 0.4 10*3/uL (ref 0.2–1.4)
ABSOLUTE NEUTROPHIL COUNT: 4.3 10*3/uL (ref 1.6–8.3)
BASOPHIL %: 0.4 % (ref 0.0–1.2)
EOSINOPHIL %: 2.1 % (ref 0.0–7.0)
HEMATOCRIT: 37.6 % (ref 34.1–44.9)
HEMOGLOBIN: 12.5 g/dL (ref 11.2–15.7)
IMMATURE GRANULOCYTE %: 0.1 % (ref 0.0–0.4)
LYMPHOCYTE %: 33.1 % (ref 15.0–54.0)
MEAN CORP HGB CONC: 33.2 g/dL (ref 31.0–37.0)
MEAN CORPUSCULAR HGB: 27.1 pg (ref 26.0–34.0)
MEAN CORPUSCULAR VOL: 81.6 fL (ref 80.0–100.0)
MEAN PLATELET VOLUME: 9.4 fL (ref 8.7–12.5)
MONOCYTE %: 5.2 % (ref 4.0–13.0)
NEUTROPHIL %: 59.1 % (ref 40.0–75.0)
PLATELET COUNT: 305 10*3/uL (ref 150–400)
RBC DISTRIBUTION WIDTH STD DEV: 42.3 fL (ref 35.1–46.3)
RBC DISTRIBUTION WIDTH: 14.2 % (ref 11.5–14.3)
RED BLOOD CELL COUNT: 4.61 M/uL (ref 3.90–5.20)
WHITE BLOOD CELL COUNT: 7.3 10*3/uL (ref 4.0–11.0)

## 2014-04-13 LAB — HOLD BLUE TOP TUBE

## 2014-04-13 LAB — POC URINALYSIS
BILIRUBIN, URINE: NEGATIVE
GLUCOSE,URINE: NEGATIVE
KETONE, URINE: NEGATIVE
LEUKOCYTE ESTERASE: NEGATIVE
NITRITE, URINE: NEGATIVE
PH URINE: 5.5 (ref 5.0–8.0)
PROTEIN, URINE: NEGATIVE
SPECIFIC GRAVITY, URINE: >=1.03 (ref 1.003–1.030)
UROBILINOGEN URINE: 0.2 (ref 0.2–1.0)

## 2014-04-13 LAB — URINE CULTURE

## 2014-04-13 LAB — CT ABDOMEN & PELVIS WO IV CONTRAST

## 2014-04-13 MED ORDER — IBUPROFEN 800 MG PO TABS
800.0000 mg | ORAL_TABLET | Freq: Three times a day (TID) | ORAL | Status: AC | PRN
Start: 2014-04-13 — End: 2014-04-20

## 2014-04-13 MED ORDER — KETOROLAC TROMETHAMINE 30 MG/ML INJ
30.00 mg | Freq: Once | Status: AC
Start: 2014-04-13 — End: 2014-04-13
  Administered 2014-04-13: 30 mg via INTRAVENOUS
  Filled 2014-04-13: qty 1

## 2014-04-13 MED ORDER — CIPROFLOXACIN HCL 500 MG PO TABS
500.00 mg | ORAL_TABLET | Freq: Two times a day (BID) | ORAL | Status: AC
Start: 2014-04-13 — End: 2014-04-17

## 2014-04-13 MED ORDER — SODIUM CHLORIDE 0.9 % IV BOLUS
1000.00 mL | Freq: Once | INTRAVENOUS | Status: AC
Start: 2014-04-13 — End: 2014-04-13
  Administered 2014-04-13: 1000 mL via INTRAVENOUS

## 2014-04-13 NOTE — ED Notes (Signed)
Patient Disposition    Patient education for diagnosis, medications, activity, diet and follow-up.  Patient left ED 5:26 PM.  Patient rep received written instructions.  Interpreter to provide instructions: No    Have all existing LDAs been addressed? Yes    Have all IV infusions been stopped? Yes    Discharged to: Discharged to home

## 2014-04-13 NOTE — Discharge Instructions (Signed)
DIAGNOSIS & TREATMENT:  You were seen in a [facility] Emergency Department for lower back pain. You were treated with IV fluids and toradol    TEST RESULTS:  Your bloodwork was unremarkable  Your CT scan did not show evidence of kidney stones  Your urine no longer shows infection    FURTHER CARE:  Hydration:  Please drink plenty of water as being sick can cause you to become very dehydrated quickly making you feel worse including severe headaches. We recommend that you drink plenty of water. You should target to have your urine light yellow to clear. Use sports drinks like Gatorade cautiously as some have artificial sweeteners that can cause diarrhea if you drink too much of them. Avoid drinks with caffeine like soda, coffee, and tea as caffeine will dehydrate you.     Apply heat to low back and use provided exercises    NEW MEDICATIONS:    Motrin: take every 6-8hrs for pain control    Ciprofloxacin: this is the same antibiotic you were prescribed. Take this extended course for your persistent symptoms    WHEN SHOULD YOU BE SEEN NEXT?   Please keep your scheduled appointment with your PCP tomorrow. If you do not have a primary care doctor or would like to transfer your primary care to [facility], please call [phone] to set one up an appointment.    WHEN SHOULD YOU RETURN TO THE ED?  Please return to the emergency room if you develop fever, increased difficulty breathing, vomiting, you pass out, your symptoms worsen, you get new symptoms or you are unable to schedule follow up care.

## 2014-04-13 NOTE — ED Provider Notes (Signed)
ED nursing record was reviewed. Prior records as available electronically through the Epic record were reviewed.    HPI:    This 58 year old female patient with history of nephrolithiasis, low back pain, presents to the Emergency Department with chief complaint of persistent low back pain. Patient was seen two days ago, diagnosed with low back pain and UTI. Patient states she completed her medication but pain has persisted. She states low back pain began last week, denies injury or trauma, worse with movement but constant, worse on the left side. Sometimes radiates to legs. States pain is similar to previous kidney stone. Denies incontinence. Last bowel movement was yesterday and was normal, no blood present. Patient reports continued increased urinary frequency and dysuria. She denies fever, chills, night sweats, nausea or vomiting, vaginal discharge or bleeding.       ROS: Pertinent positives were reviewed as per the HPI above. All other systems were reviewed and are negative.      Past Medical History/Problem list:    Past Medical History    Screening for hypertension 08/05/2004    Comment: occ elevated BP started on anti-HTN in [place]; chem 7, EKG, HCT u/a wnl 4/03; d/c'ed HCTZ 8/05 when we learned from daughter she has not been taking it    ANXIETY DEPRESSION 08/05/2004    Comment: TSH wnl 8/02; trial of fluoxetine 8/05    PERS HX TOBACCO USE 08/05/2004    Comment: quit 10/02; 1/2 PPD since 58 YO    BACK PAIN LOW 08/05/2004    Comment: LBP musculoskeletal most likely 1/04    OBESITY NOS 08/05/2004    Comment: has tried meds from [place]    PERS HX OF PAST NONCOMPLIANCE 08/05/2004    Comment: poor compliance to med regimen    Hyperparathyroidism 10/22/2006    Comment: osteoporosis found - pt referred to surgery for the hyperparathyroidism- s/p parathyroidectomy feb 2008    Vitamin D deficiency 05/19/2008    Comment: Supplement per dr Jake Samples.    Diarrhea 03/27/2006    Comment: 7/07 The colonoscopy was visually normal  and biopsies taken to rule out microscopic colitis were negative.  A biopsy of the descending duodenum to rule out celiac sprue because of the diarrhea was negative.      ABN FIND-STOOL CONTENTS-OCC BLOOD 03/27/2006    Comment: s/p colonoscopy and EGD 7/07    Cough 04/23/2006    Comment: s/p PFTs September 2007 --  some obstruction at mid to low lung volumes not correctable by bronchodilator CT scan showed stable pulmonary nodules over several years 11/07 - cough gone ** note combined with dx of "nodules on CT scan". Will file this note to hx.       ABN FIND-STOOL CONTENTS-OCC BLOOD 03/27/2006    Comment: s/p colonoscopy and EGD 7/07    Cough 04/23/2006    Comment: s/p PFTs September 2007 --  some obstruction at mid to low lung volumes not correctable by bronchodilator CT scan showed stable pulmonary nodules over several years 11/07 - cough gone ** note combined with dx of "nodules on CT scan". Will file this note to hx.       Diarrhea 03/27/2006    Comment: 7/07 The colonoscopy was visually normal and biopsies taken to rule out microscopic colitis were negative.  A biopsy of the descending duodenum to rule out celiac sprue because of the diarrhea was negative.      Gastritis 12/19/2012    Comment: Combined with entry "esophageal reflux  Patient Active Problem List:     depressive disorder     Obesity, unspecified     LATERAL FOOT PAIN     Benign hypertensive heart disease without heart failure     Alkaline phosphatase elevation     Insomnia, unspecified     Calculus of kidney     nodules on chest CT 1/06 and 3/07     Gastritis     Allergic rhinitis due to other allergen     ABSENCE OF MENSTRUATION - s/p TAH BSO     Osteoporosis     Murmur heart     Family History of Throat Cancer     Vitamin D Deficiency     Inconclusive mammogram     Subclavian artery stenosis, left     Schatzki's ring     Duodenal ulcer, unspecified as acute or chronic, without hemorrhage, perforation, or obstruction     Severe Chest Pain x 2 Days,  Intermittent, Early April 2014, Resolved         Past Surgical History:     Past Surgical History    TOTAL ABDOMINAL HYSTERECT W/WO RMVL TUBE OVARY  5/00    Comment Hysterectomy, Total Abdominal secondary to fibroids    SALPINGO-OOPHORECTOMY COMPL/PRTL UNI/BI SPX  5/00    Comment Salpingo-Oophorectomy, bilateral    PARATHYROIDECTOMY  2006    Comment partial,     TONSILLECTOMY ONE-HALF            Medications:   No current facility-administered medications on file prior to encounter.  Current Outpatient Prescriptions on File Prior to Encounter:  ciprofloxacin (CIPRO) 500 MG tablet Take 1 tablet by mouth 2 (two) times daily. Disp: 6 tablet Rfl: 0   ibuprofen (ADVIL,MOTRIN) 800 MG tablet Take 1 tablet by mouth every 8 (eight) hours as needed. pain Disp: 20 tablet Rfl: 0   lidocaine (LIDODERM) 5 % patch Place 1 patch onto the skin daily. Disp: 7 patch Rfl: 0   diazepam (VALIUM) 5 MG tablet Take 1 tablet by mouth every 8 (eight) hours as needed (muscle spasm). Disp: 10 tablet Rfl: 0   traZODone (DESYREL) 50 MG tablet Take 1 tablet by mouth nightly. Disp: 30 tablet Rfl: 5   omeprazole (PRILOSEC) 20 MG capsule Take 1 capsule by mouth 2 (two) times daily. Disp: 60 capsule Rfl: 11   phentermine (ADIPEX-P) 37.5 MG tablet Take 37.5 mg by mouth every morning before breakfast. Indications: for weight loss, prescribed by out of state provider Disp:  Rfl:    losartan (COZAAR) 50 MG tablet Take 2 tablets po in the morning Disp: 60 tablet Rfl: 5   atorvastatin (LIPITOR) 20 MG tablet Take 1 tablet by mouth daily. Disp: 30 tablet Rfl: 2   ibuprofen (ADVIL,MOTRIN) 200 MG tablet Take 200 mg by mouth every 6 (six) hours as needed for Pain. Disp:  Rfl:    Biotin 1000 MCG TABS Tablet Take 1,000 mcg by mouth daily. Disp:  Rfl:          Social History:   Social History   Marital Status: Legally Separated  Spouse Name: N/A    Years of Education: N/A  Number of Children: N/A     Occupational History  None on file     Social History Main  Topics   Smoking status: Former Smoker  0.50 Packs/Day  For 40.00 Years     Types: Cigarettes    Quit date: 03/31/2012    Smokeless tobacco: Former Systems developer    Comment:  3-4 cigarretes a day. Quit about 1 month ago    Alcohol Use: Yes    Comment: very occasional    Drug Use: No    Sexual Activity: Yes    Partners: Male    Comment: histerectomy     Other Topics Concern   None on file     Social History Narrative    work: Engineer, building services; owned bakery in [place]    Lives w/ daughter 24 YO (Grazielle [place]wn) & son-in-law Lowella Dandy)    Divorced; currently not in a relationship           Allergies:  Review of Patient's Allergies indicates:   Heparin                     Comment:local rash seen during hospitalization Feb 2008   Lisinopril              Cough      Physical Exam:  BP 151/88   Pulse 68   Temp(Src) 98.1 F   Resp 20   SpO2 96%    GENERAL: Well appearing, No acute distress, non-toxic.   SKIN:  Warm & Dry, no erythema or rash.  HEAD:  NCAT. Sclerae are anicteric and aninjected, oropharynx is clear with moist mucous membranes.   NECK:  No C-spine tenderness, crepitus, step-off.  No meningismus.  No LAN. No stridor.  LUNGS:  Clear to auscultation bilaterally. No wheezes, rales, rhonchi.   HEART:  RRR.  No murmurs, rubs, or gallops.   ABDOMEN:  Soft, NTND.  No hepatosplenomegaly.  No masses.  No involuntary guarding or rebound.   EXTREMITIES:  No obvious deformities.  CSM intact x 4.     GENITOURINARY:  Left CVA tenderness.   BACK: no obvious deformity, soft tissue swelling, or ecchymosis present. Pain with forward flexion, lateral bending, and twisting, gait is stable. Negative straight leg raise.   NEUROLOGIC:  Alert and oriented x4; moves all extremities well; speaking in clear fluent sentences. CNsII-XII symmetrical and intact. Sensation intact to light touch throughout. 5/5 strength globally. Normoreflexic bilaterally  PSYCHIATRIC:  Appropriate for age, time of day, and situation    RESULTS  Results for orders placed  during the hospital encounter of 04/13/14 (from the past 24 hour(s))   POC URINALYSIS    Collection Time     04/13/14  2:27 PM       Result Value    COLOR YELLOW      CLARITY CLEAR      GLUCOSE,URINE NEGATIVE      BILIRUBIN, URINE NEGATIVE      KETONE, URINE NEGATIVE      SPECIFIC GRAVITY, URINE >=1.030      UROBILINOGEN URINE 0.2      OCCULT BLOOD, URINE SMALL (*)     PH URINE 5.5      PROTEIN, URINE NEGATIVE      NITRITE, URINE NEGATIVE      LEUKOCYTE ESTERASE NEGATIVE             ED Course and Medical Decision-making:  This 58 year old female patient  with history of nephrolithiasis, low back pain, presents to the Emergency Department with chief complaint of persistent low back pain. Vital signs stable upon presentation. Physical exam significant for left CVA tenderness to palpation, pain with back forward flexion, lateral bending, and twisting. Even with translator there is uncertainty in patient's compliance with prescribed valium and ciprofloxacin two days ago. Considered low back strain vs. UTI vs.  Pyelonephritis vs. Nephrolithiasis.     Urinalysis without evidence of infection, blood present, which raises suspicion for kidney stone. Peripheral IV established and fluid bolus administered. Patient given IV toradol. Bloodwork unremarkable. CT with the following results:  Impression: No CT evidence for obstructive uropathy on the left.   2. There is no acute intra-abdominal findings. No inflammatory process   or collection. There is a mild to moderate stool burden.   3. Hemangiomas of the L4 and L5 vertebral bodies.   Considered musculoskeletal strain. Patient prescribed additional 4 days of ciprofloxacin to extend antibiotic course for UTI int he setting of persistent symptoms. She is also given prescription for 800mg  motrin.  Patient is instructed to followup with her PCP tomorrow as scheduled.  She is instructed to return to the Emergency Department if she experiences worsening symptoms, new symptoms such as  fever, vomiting but he did ambulate, comply with discharge instructions.  Patient understands and agrees with plan.      Condition: stable  Disposition: home      Diagnosis/Diagnoses:  Low back pain    Debara Pickett, PA-C

## 2014-04-13 NOTE — ED Triage Note (Signed)
58 year old female presents ambulatory to triage complaining of lumbar pain radiates to lower abdo constant since Tuesday , denies fall also reports frequent void  & burning w urination

## 2014-04-14 ENCOUNTER — Ambulatory Visit (HOSPITAL_BASED_OUTPATIENT_CLINIC_OR_DEPARTMENT_OTHER): Payer: PRIVATE HEALTH INSURANCE | Admitting: Internal Medicine

## 2014-04-14 ENCOUNTER — Encounter (HOSPITAL_BASED_OUTPATIENT_CLINIC_OR_DEPARTMENT_OTHER): Payer: Self-pay | Admitting: Internal Medicine

## 2014-04-14 VITALS — BP 170/100 | Temp 98.8°F | Ht 63.0 in | Wt 206.0 lb

## 2014-04-14 DIAGNOSIS — S335XXD Sprain of ligaments of lumbar spine, subsequent encounter: Secondary | ICD-10-CM

## 2014-04-14 DIAGNOSIS — N390 Urinary tract infection, site not specified: Secondary | ICD-10-CM

## 2014-04-14 DIAGNOSIS — F330 Major depressive disorder, recurrent, mild: Secondary | ICD-10-CM

## 2014-04-14 NOTE — Progress Notes (Signed)
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 Rx ibuprofen 800 mg, with moderate relief. She was dx with UTI, and prescribed antibiotics  She returned to the ED wo days later went to the ED again, because of worsening pain, and feeling of cramps  She was given IV toradol, that significantly improved her sx, and more abx    Today she reports that pain is improved, but she continues taking NSAID around the clock  She denies paresthesias, urinary or bowel incontinence  Dysuria and cramps are almost completely resolved  No fever, chills, or decreased appetite    OBJECTIVE:  BP 170/100   Temp(Src) 98.8 F (37.1 C) (Oral)   Ht 5\' 3"  (1.6 m)   Wt 206 lb (93.441 kg)   BMI 36.5 kg/m2  Pleasant, in NAD    Heart: S1 and S2 normal, no murmurs, clicks, gallops or rubs. Regular rate and rhythm.   Lungs:  clear; no wheezes, rhonchi or rales.  Back: On inspection, back is symmetric. There are no obvious deformities, erythema or edema. There is no tenderness to palpation over the cervical spine. There is  tenderness to palpation on paravertebral muscles. There is tenderness to palpation on lumbar region. She has restricted range of motion d/t stiffness on flexion, extension, and rotation.        ASSESSMENT & PLAN:  (V58.89) Sprain of lumbar region, subsequent encounter  (primary encounter diagnosis)  Comment: exacerbation of chronic problem  Natural course of illness discussed. Expect gradual resolution of sx.   Continue NSAID, consider PT, regular physical activity, stretching exercise modalities, like Yoga and Pilates  Plan: REFERRAL TO PHYSICAL THERAPY ( INT)           (296.31) depressive disorder  Comment: Noted on problem list. Reassessed, pt reports feeling well, with no sx of depression. Just reports being tired, which she attributes to current life style.   Plan: Resolve from problem list, monitor prn    (599.0) Urinary tract infection, site not specified  Comment: Resolved  Plan:     The patient was ready to learn and no apparent learning or adherence barriers were identified. I explained the diagnosis and treatment plan, and the patient expressed understanding of the content. I attempted to answer any questions regarding the diagnosis and the proposed treatment.    We discussed the patients current medications. The patient expressed understanding and no barriers to adherence were identified.    follow-up will be scheduled prn  she has been advised to call or return with any worsening or new problems

## 2014-04-15 ENCOUNTER — Encounter (HOSPITAL_BASED_OUTPATIENT_CLINIC_OR_DEPARTMENT_OTHER): Payer: Self-pay | Admitting: Internal Medicine

## 2014-04-16 ENCOUNTER — Ambulatory Visit (HOSPITAL_BASED_OUTPATIENT_CLINIC_OR_DEPARTMENT_OTHER): Payer: PRIVATE HEALTH INSURANCE | Admitting: Gastroenterology

## 2014-04-28 ENCOUNTER — Ambulatory Visit (HOSPITAL_BASED_OUTPATIENT_CLINIC_OR_DEPARTMENT_OTHER): Payer: PRIVATE HEALTH INSURANCE | Admitting: Gastroenterology

## 2014-04-28 VITALS — BP 158/101 | HR 79 | Temp 97.2°F | Resp 16 | Ht 63.0 in | Wt 201.0 lb

## 2014-04-28 DIAGNOSIS — R748 Abnormal levels of other serum enzymes: Secondary | ICD-10-CM

## 2014-04-28 LAB — APTT: APTT: 27.5 SECONDS (ref 23.7–33.3)

## 2014-04-28 LAB — PROTHROMBIN TIME
INR: <1 (ref 2.0–3.5)
PROTHROMBIN TIME: 9.8 SECONDS (ref 9.5–11.5)

## 2014-04-28 NOTE — Progress Notes (Signed)
Primary note dictated through E-Scription.  See "Notes" tab in Chart Review for transcribed note; final note also appears within encounter.    I have spent  minutes in face to face time with this patient/patient proxy of which > 50% was in counseling or coordination of care regarding above issues/Dx.      Patient understands and agrees with the plan  .dict

## 2014-04-28 NOTE — Progress Notes (Signed)
Date of Service: 04/28/2014    HISTORY OF PRESENT ILLNESS:  The patient was seen in followup for her elevated alkaline phosphatase of liver origin.    She complains of fatigue and occasional itching.    Her liver function tests done on April 13, 2014 showed an elevation of the alkaline phosphatase the patient 207 and elevation of the AST to 48 and the ALT to 67.  The liver function tests that were done 6 months earlier showed only an elevation of the alkaline phosphatase to 173.    The antimitochondrial antibody that was done in 2014 was 20.4, which is in the equivocal range of the test.    PAST MEDICAL HISTORY:  Obesity, hypertension, kidney stone, gastritis, subclavian artery stenosis, and Schatzki ring.    ALLERGIES:  Heparin.  Lisinopril causes cough.    MEDICATIONS:  Prilosec, Cozaar, and trazodone.    PHYSICAL EXAMINATION:  GENERAL:  She is overweight, but in no distress.  VITAL SIGNS:  Blood pressure 158/101, pulse 79, temperature 97.2, oxygen saturation 99%, and weight 201 pounds.  HEAD AND ENT:  She has no icterus or pallor.  She has no cutaneous manifestations of chronic liver disease.  ABDOMEN:  Shows an obese abdomen, but it is hard to palpate organomegaly.  She has no ascites or edema.    ASSESSMENT AND PLAN:  This patient could have primary biliary cirrhosis or nonalcoholic steatohepatitis.    I discussed with her the options of performing a liver biopsy or empirically treating her for primary biliary cirrhosis  with Actigall.  I told her that since the diagnosis of primary biliary cirrhosis is not certain, it would be preferable if a liver biopsy is performed.    The patient was interviewed with the help of a Portuguese-speaking interpreter via video conference.  She explained to the patient the options and the performance of her liver biopsy also.    The patient has had blood drawn recently for liver function tests, kidney function tests, and a CBC with platelets, so the only tests needed prior to  biopsy are a prothrombin time and a PTT, which were ordered.    ___________________________  Reviewed and Electronically Signed By: Junius Argyle MD  Sig Date: 05/01/2014  Sig Time: 09:36:32  Dictated By: Junius Argyle MD  Dict Date: 04/28/2014 Dict Time: 11 02 AM    Dictation Date and Time:04/28/2014 11:02:27  Transcription Date and Time:04/28/2014 11:37:42  eScription Dictation id: [id number] Confirmation # :[id number]      cc: Lawernce Pitts APRN

## 2014-04-30 ENCOUNTER — Telehealth (HOSPITAL_BASED_OUTPATIENT_CLINIC_OR_DEPARTMENT_OTHER): Payer: Self-pay

## 2014-04-30 NOTE — Progress Notes (Signed)
Using interpreter I booked pt apt.     patient Educated regarding Liver BX procedure. Procedure date and time reviewed.   Pre-Procedure instructions given as follows:    Instructed that a responsible party must provide pt. With transportation day of procedure, due to sedation that is given.   Instructed no eating or drinking after Midnight the night prior to procedure. Reviewed Medications and instructed to take morning medications with small sip of water. Pt. Told not to take Advil until after procedure. Instructed to contact primary care with any medication concerns.    Pt advised that they will need to stop anti-coagulants one week prior to procedure. Told to contact provider managing therapy.   Pt. Told not to take any over the counter medication containing aspirin one week prior to procedure.   patient verbalized understanding and had no further questions at this time.

## 2014-05-04 ENCOUNTER — Encounter (HOSPITAL_BASED_OUTPATIENT_CLINIC_OR_DEPARTMENT_OTHER): Payer: Self-pay | Admitting: Gastroenterology

## 2014-05-04 ENCOUNTER — Ambulatory Visit (HOSPITAL_BASED_OUTPATIENT_CLINIC_OR_DEPARTMENT_OTHER)
Admit: 2014-05-04 | Disposition: A | Discharge status: Home / Self Care | Payer: Self-pay | Source: Ambulatory Visit | Attending: Gastroenterology | Admitting: Gastroenterology

## 2014-05-04 MED ORDER — MIDAZOLAM HCL 2 MG/2ML IJ SOLN
INTRAMUSCULAR | Status: AC
Start: 2014-05-04 — End: 2014-05-04
  Administered 2014-05-04: 1 mg via INTRAVENOUS
  Filled 2014-05-04: qty 4

## 2014-05-04 MED ORDER — SODIUM BICARBONATE 4.2 % IV SOLN
0.50 meq | Freq: Once | INTRAVENOUS | Status: AC
Start: 2014-05-04 — End: 2014-05-04
  Administered 2014-05-04: 0.5 meq via SUBCUTANEOUS

## 2014-05-04 MED ORDER — FENTANYL CITRATE 0.05 MG/ML IJ SOLN
25.00 ug | INTRAMUSCULAR | Status: AC
Start: 2014-05-04 — End: 2014-05-04
  Administered 2014-05-04: 50 ug via INTRAVENOUS
  Filled 2014-05-04: qty 5

## 2014-05-04 MED ORDER — FENTANYL CITRATE 0.05 MG/ML IJ SOLN
INTRAMUSCULAR | Status: AC
Start: 2014-05-04 — End: 2014-05-04
  Administered 2014-05-04: 50 ug via INTRAVENOUS
  Filled 2014-05-04: qty 4

## 2014-05-04 MED ORDER — LIDOCAINE HCL (PF) 2 % IJ SOLN
6.00 mL | Freq: Once | INTRAMUSCULAR | Status: AC
Start: 2014-05-04 — End: 2014-05-04
  Administered 2014-05-04: 120 mg via SUBCUTANEOUS

## 2014-05-04 MED ORDER — ACETAMINOPHEN 325 MG PO TABS
650.0000 mg | ORAL_TABLET | Freq: Four times a day (QID) | ORAL | Status: DC | PRN
Start: 2014-05-04 — End: 2014-05-13

## 2014-05-04 MED ORDER — LIDOCAINE HCL (PF) 2 % IJ SOLN
6.0000 mL | Freq: Once | INTRAMUSCULAR | Status: DC
Start: 2014-05-04 — End: 2014-05-13

## 2014-05-04 MED ORDER — OXYCODONE-ACETAMINOPHEN 5-325 MG PO TABS
1.0000 | ORAL_TABLET | ORAL | Status: DC | PRN
Start: 2014-05-04 — End: 2014-05-13

## 2014-05-04 MED ORDER — MIDAZOLAM HCL 2 MG/2ML IJ SOLN
0.50 mg | INTRAMUSCULAR | Status: AC
Start: 2014-05-04 — End: 2014-05-04
  Administered 2014-05-04: 1 mg via INTRAVENOUS
  Filled 2014-05-04: qty 16

## 2014-05-04 NOTE — Pre-Procedure Instructions (Signed)
PRE-PROCEDURE NOTE      Patients H&P Reviewed : Yes    Any Changes to the H&P : No      ASA Classification : II   A Patient With Mild Systemic Disease    Airway Classification : Class II   The same as Class I except the tonsilar pillars are hidden by the tounge.    Anesthesiology Consulted : No    Frances Maywood, 05/04/2014, 10:28 AM     [id number]

## 2014-05-04 NOTE — Discharge Instructions (Signed)
BIÓPSIA HEPÁTICA  LIVER BIOPSY    Orientações de [place]:  Discharge Instructions:    Alimentação: Retomar a dieta anterior. NÃO CONSUMA ÁLCOOL POR 24 HORAS.   Diet: Resume previous diet NO ALCOHOL CONSUMPTION FOR 24 HOURS.     Medicamentos:  • Não tome nenhum diluente sanguíneo, aspirina ou produtos que contenham aspirina por até 24 horas após o procedimento.  • Volte a tomar os medicamentos anteriores, conforme prescrito  Medications:  • Do not take any blood thinners, Aspirin or Aspirin containing products for 24 hours after the procedure.  • Resume previous medications as prescribed    Atividades: Evite exercícios físicos intensos e levantar pesos durante 24-48 horas.  Activity: Avoid strenuous exercise and heavy lifting for 24-48hours.    Higiene / cuidados pessoais / instruções adicionais:  • Manter o local da biopsia seco por 24 horas.  • Você pode remover o curativo depois das primeiras 24 horas.   Hygiene / self care/ Additional Instructions:  • Keep the biopsy site dry for 24 hours.  • You may remove the dressing after the initial 24hours.     Sintomas para informar ao seu médico:  • Temperatura de 101,5 graus Fahrenheit (38,6 graus Celsius) ou maior  • Dor intensa no peito ou no abdômen  • Dificuldade em respirar quando respira fundo  • Sangramento excessivo, inchaço, vermelhidão ou drenagem do local da punção  • Tonturas ou vertigens.   Symptoms to report to your Doctor:  • Temperature of 101.5 degrees Fahrenheit or greater  • Intense pain in chest or abdomen  • Difficulty breathing when taking a deep breath  • Excessive bleeding, swelling, redness or drainage from puncture site.  • Light headedness or dizziness.

## 2014-05-04 NOTE — Progress Notes (Signed)
Pt to IR from home for a liver bx tolerated procedure well minimal sedation vitals stable throughout procedure refer to doc flow sheets for actual data.  Band-Aid in place at needle site CD&I pt transported to PACU by this RN in stable condition.

## 2014-05-04 NOTE — Brief Op Note (Signed)
Brief Procedure or Operative Note    Procedure: CT guided liver biopsy    Pre-Procedure Diagnosis:   Abnormal liver function tests    Post-Procedure Diagnosis:  Same    Surgeon: Frances Maywood, MD    Assistant:  None    Type of Anesthesia:   Moderate Sedation    Findings:   Three 18g biopsies of the liver.    Estimated Blood Loss:   negligible    Specimens Removed:  None    Complications:  None    Other:  None    Pager : [phone]

## 2014-05-04 NOTE — Progress Notes (Signed)
pt arrived in department with daughter. Interp requested. Pt alert and oriented times three . Daughter at bedsides.   LGAETA RN

## 2014-05-07 LAB — SURGICAL PATH SPECIMEN

## 2014-06-09 ENCOUNTER — Ambulatory Visit (HOSPITAL_BASED_OUTPATIENT_CLINIC_OR_DEPARTMENT_OTHER): Payer: PRIVATE HEALTH INSURANCE | Admitting: Internal Medicine

## 2014-06-09 VITALS — BP 155/82 | HR 78 | Temp 96.9°F | Ht 63.0 in | Wt 199.0 lb

## 2014-06-09 DIAGNOSIS — E785 Hyperlipidemia, unspecified: Secondary | ICD-10-CM

## 2014-06-09 DIAGNOSIS — R52 Pain, unspecified: Secondary | ICD-10-CM

## 2014-06-09 DIAGNOSIS — M7989 Other specified soft tissue disorders: Secondary | ICD-10-CM

## 2014-06-09 DIAGNOSIS — G4700 Insomnia, unspecified: Secondary | ICD-10-CM

## 2014-06-09 DIAGNOSIS — I119 Hypertensive heart disease without heart failure: Secondary | ICD-10-CM

## 2014-06-09 LAB — RBC SEDIMENTATION RATE: RBC SEDIMENTATION RATE: 27 MM/HR — ABNORMAL HIGH (ref 0–15)

## 2014-06-09 MED ORDER — ATORVASTATIN CALCIUM 20 MG PO TABS
20.0000 mg | ORAL_TABLET | Freq: Every day | ORAL | Status: DC
Start: 2014-06-09 — End: 2015-01-14

## 2014-06-09 MED ORDER — VITAMIN D (ERGOCALCIFEROL) 1.25 MG (50000 UT) PO CAPS
1.0000 | ORAL_CAPSULE | ORAL | Status: DC
Start: 2014-06-09 — End: 2014-06-09

## 2014-06-09 NOTE — Progress Notes (Signed)
SUBJECTIVE  Mariah [name] is a 58 year old female, [place] speaker    C/o right hand swelling  Intermittent x 1 month  Swelling is located in the entire hand, and fingers feel very puffy, and stiff  No particular joint involvement  No other parts of her body involved  People have noticed the swelling  She denies tingling, numbness, new creams, lotions, gloves  Reports generalized body aches    #2) HTN  Pt reports that he takes his medications daily. Denies chest pain, SOB, DOE, palpitations, dizziness, head aches, leg edema.   Most Recent BP Reading(s)  06/09/14 : 155/82  05/04/14 : 172/88  04/28/14 : 158/101  04/14/14 : 170/100  04/13/14 : 151/88    #3) Hyperlipidemia  Stopped taking statin x 3 months, "I just decided to stop"     ROS: No fevers or unexplained weight loss. No new headaches. No shortness of breath or chest pain.    OBJECTIVE:  BP 155/82   Pulse 78   Temp(Src) 96.9 F (36.1 C)   Ht 5\' 3"  (1.6 m)   Wt 199 lb (90.266 kg)   BMI 35.26 kg/m2   SpO2 98%  Pleasant, in NAD    Heart: S1 and S2 normal, no murmurs, clicks, gallops or rubs. Regular rate and rhythm.   Lungs:  clear; no wheezes, rhonchi or rales.  Hands: Right digits appear slightly edematous, and erythematous. No deformities.  No pitting edema, no blanching. No tenderness to palpation over IP joints, or carpal bones. No restricted range of movement. Bl radial pulse.       ASSESSMENT & PLAN:  (780.96) Body aches  (primary encounter diagnosis)  Comment: inflammatory vs musculoskeletal  -- regular physical activity, stretching, massage    Plan: atorvastatin (LIPITOR) 20 MG tablet, COLLECTION        VENOUS BLOOD VENIPUNCTURE, RBC SEDIMENTATION         RATE, ANTINUCLEAR ANTIBODY, THYROID SCREEN TSH         REFLEX FT4, VITAMIN D,25 HYDROXY, DISCONTINUED:        Vitamin D, Ergocalciferol, 50000 UNITS capsule            (272.4) Elevated lipids  Comment: recheck  Plan: LIPID PANEL            (729.81) Swelling of hand  Comment: unclear  etiology  Plan: r/o inflammatory process, labs above. Monitor. Refer to Dr Joylene John prn.    (402.10) Benign hypertensive heart disease without heart failure  Comment: Baseline  Plan: Continue current regimen    (780.52) Insmonia  good response to trazodone. Takes 2-3 times a week, and sometimes takes it daily.  Plan: continue taking as needed    The patient was ready to learn and no apparent learning or adherence barriers were identified. I explained the diagnosis and treatment plan, and the patient expressed understanding of the content. I attempted to answer any questions regarding the diagnosis and the proposed treatment.    We discussed the patients current medications. The patient expressed understanding and no barriers to adherence were identified.    follow-up will be scheduled in 2 wks for BP check, review labs, due for CPE, ifob  she has been advised to call or return with any worsening or new problems

## 2014-06-12 LAB — ANTINUCLEAR ANTIBODY: ANTINUCLEAR ANTIBODY: NEGATIVE

## 2014-06-12 LAB — LIPID PANEL
Cholesterol: 198 mg/dL (ref 0–239)
HIGH DENSITY LIPOPROTEIN: 53 mg/dL (ref 40–?)
LOW DENSITY LIPOPROTEIN DIRECT: 130 mg/dL (ref 0–189)
TRIGLYCERIDES: 204 mg/dL — ABNORMAL HIGH (ref 0–150)

## 2014-06-12 LAB — VITAMIN D,25 HYDROXY: VITAMIN D,25 HYDROXY: 21 ng/mL — ABNORMAL LOW (ref 30.0–100.0)

## 2014-06-12 LAB — THYROID SCREEN TSH REFLEX FT4: THYROID SCREEN TSH REFLEX FT4: 2.04 u[IU]/mL (ref 0.358–3.740)

## 2014-06-18 ENCOUNTER — Encounter (HOSPITAL_BASED_OUTPATIENT_CLINIC_OR_DEPARTMENT_OTHER): Payer: Self-pay | Admitting: Internal Medicine

## 2014-06-18 DIAGNOSIS — E559 Vitamin D deficiency, unspecified: Secondary | ICD-10-CM

## 2014-06-18 MED ORDER — CHOLECALCIFEROL 25 MCG (1000 UT) PO TABS
1000.0000 [IU] | ORAL_TABLET | Freq: Every day | ORAL | Status: DC
Start: 2014-06-18 — End: 2014-08-27

## 2014-07-06 ENCOUNTER — Encounter (HOSPITAL_BASED_OUTPATIENT_CLINIC_OR_DEPARTMENT_OTHER): Payer: Self-pay | Admitting: Internal Medicine

## 2014-07-06 ENCOUNTER — Ambulatory Visit (HOSPITAL_BASED_OUTPATIENT_CLINIC_OR_DEPARTMENT_OTHER): Payer: PRIVATE HEALTH INSURANCE | Admitting: Internal Medicine

## 2014-07-06 VITALS — BP 185/85 | Ht 63.0 in | Wt 201.0 lb

## 2014-07-06 DIAGNOSIS — I119 Hypertensive heart disease without heart failure: Secondary | ICD-10-CM

## 2014-07-06 DIAGNOSIS — R5383 Other fatigue: Principal | ICD-10-CM

## 2014-07-06 DIAGNOSIS — R079 Chest pain, unspecified: Secondary | ICD-10-CM

## 2014-07-06 DIAGNOSIS — R5381 Other malaise: Secondary | ICD-10-CM

## 2014-07-06 DIAGNOSIS — R52 Pain, unspecified: Secondary | ICD-10-CM

## 2014-07-06 MED ORDER — LOSARTAN POTASSIUM 50 MG PO TABS
ORAL_TABLET | ORAL | Status: DC
Start: 2014-07-06 — End: 2015-01-14

## 2014-07-06 NOTE — Progress Notes (Addendum)
SUBJECTIVE  Mariah [name] is a 58 year old female, [place] speaker    "I really don't feel well, and I'm scared"  2 episodes of nausea and light headedness  Cold sweats, and witnessed becoming pale  1st episode 2 months ago, last episode was last week  She was at work both times  Last week she checked her BP at home, and it was 180/100    Complaints of weakness since the beginning of the year, "I know that I need to do things, but I do not have the energy, or the desire to do it" "I don't feel depressed, but I feel sick, and this is not normal".    Complaints of stiffness on her joints, mainly her hands, and generalized body aches, including sternal pain    No n/v/d. No palpitations, no SOB, no DOE, no lightheadedness, (except the episodes described above), no leg edema    Recent  workup for these complaints include a normal CBC, TSH, ANA  Low vit D, 17, that pt is supplementing  ESR elevated, 27  Elevated LFTs, pt has known fatty liver. She's been followed by Dr Marcelene Butte, GI. Her ALT level decreased from 71 (jan/14) to 67 (apr/15), AST increased from 40 to 48, and Alk Phsp increased from 142 to 207    Life style:   Works as Electrical engineer. No regular exercise. Irregular eating patterns  Poor sleep, pt reports difficulty falling asleep, sometimes wakes up at night  Long hx of obesity, and using diet pills. She denies taking diet pills at the moment  Stressors include lack of time, having mother visiting from [place] for several months. This was a good type of stress, and pt is very sad that mother is leaving next week.    Most Recent BP Reading(s)  07/06/14 : 185/85  06/09/14 : 155/82  05/04/14 : 172/88  04/28/14 : 158/101  04/14/14 : 170/100    OBJECTIVE:  BP 185/85   Ht $R'5\' 3"'sl$  (1.6 m)   Wt 201 lb (91.173 kg)   BMI 35.61 kg/m2  Pleasant, anxious    Heart: S1 and S2 normal, no murmurs, clicks, gallops or rubs. Regular rate and rhythm.   Lungs:  clear; no wheezes, rhonchi or rales.    ASSESSMENT & PLAN:  (780.79)  Malaise and fatigue  (primary encounter diagnosis)  Comment: Recurrent complaint, so far wu indicates low vit D, elevated ESR. Unclear if elevated LFTs can contribute to level of fatigue, I mostly suspect life style.  PHQ-9 = 12  Plan: Support given, stress mgmt, r/o other cardiac, rehum, for reassurance    (402.10) Benign hypertensive heart disease without heart failure  Comment: elevated today, continue regimen, adjust if continues elevated at next visit, since she's had several elevated readings. Consider starting amlodipine 2.5 mg  Low sodium, weight control, regular exercise encouraged  Plan: losartan (COZAAR) 50 MG tablet          (786.50) Chest pain, unspecified  Comment: Unclear if cardiac, anxiety, or other etiology  Plan: EKG, appears similar to previous EKG, per machine report. Await official cardiology report.      (780.96) Body aches  Comment: Unclear cause, interfering with daily activities. ? fibromyalgia  Plan: RHEUMATOID FACTOR, REFERRAL TO RHEUMATOLOGY (         INT)           The patient was ready to learn and no apparent learning or adherence barriers were identified. I explained the diagnosis and treatment plan,  and the patient expressed understanding of the content. I attempted to answer any questions regarding the diagnosis and the proposed treatment.    We discussed the patients current medications. The patient expressed understanding and no barriers to adherence were identified.    follow-up will be scheduled 1 wk to review labs, recheck BP  she has been advised to call or return with any worsening or new problems

## 2014-07-07 LAB — RHEUMATOID FACTOR: RHEUMATOID FACTOR: NEGATIVE

## 2014-07-08 ENCOUNTER — Encounter (HOSPITAL_BASED_OUTPATIENT_CLINIC_OR_DEPARTMENT_OTHER): Payer: Self-pay | Admitting: Internal Medicine

## 2014-07-08 LAB — EKG

## 2014-07-13 ENCOUNTER — Ambulatory Visit (HOSPITAL_BASED_OUTPATIENT_CLINIC_OR_DEPARTMENT_OTHER): Payer: PRIVATE HEALTH INSURANCE | Admitting: Lab

## 2014-07-15 ENCOUNTER — Ambulatory Visit (HOSPITAL_BASED_OUTPATIENT_CLINIC_OR_DEPARTMENT_OTHER): Payer: No Typology Code available for payment source | Admitting: Internal Medicine

## 2014-07-28 ENCOUNTER — Ambulatory Visit (HOSPITAL_BASED_OUTPATIENT_CLINIC_OR_DEPARTMENT_OTHER): Payer: PRIVATE HEALTH INSURANCE | Admitting: Internal Medicine

## 2014-08-11 ENCOUNTER — Ambulatory Visit (HOSPITAL_BASED_OUTPATIENT_CLINIC_OR_DEPARTMENT_OTHER): Payer: PRIVATE HEALTH INSURANCE | Admitting: Gastroenterology

## 2014-08-19 ENCOUNTER — Ambulatory Visit (HOSPITAL_BASED_OUTPATIENT_CLINIC_OR_DEPARTMENT_OTHER): Payer: No Typology Code available for payment source | Admitting: Internal Medicine

## 2014-08-25 ENCOUNTER — Ambulatory Visit (HOSPITAL_BASED_OUTPATIENT_CLINIC_OR_DEPARTMENT_OTHER): Payer: No Typology Code available for payment source | Admitting: Internal Medicine

## 2014-08-27 ENCOUNTER — Encounter (HOSPITAL_BASED_OUTPATIENT_CLINIC_OR_DEPARTMENT_OTHER): Payer: Self-pay | Admitting: Internal Medicine

## 2014-08-27 ENCOUNTER — Ambulatory Visit (HOSPITAL_BASED_OUTPATIENT_CLINIC_OR_DEPARTMENT_OTHER): Payer: No Typology Code available for payment source | Admitting: Internal Medicine

## 2014-08-27 VITALS — BP 164/86 | HR 68 | Temp 97.5°F | Ht 63.0 in | Wt 205.0 lb

## 2014-08-27 DIAGNOSIS — E559 Vitamin D deficiency, unspecified: Secondary | ICD-10-CM

## 2014-08-27 DIAGNOSIS — M791 Myalgia, unspecified site: Secondary | ICD-10-CM

## 2014-08-27 DIAGNOSIS — G4700 Insomnia, unspecified: Secondary | ICD-10-CM

## 2014-08-27 DIAGNOSIS — K2970 Gastritis, unspecified, without bleeding: Secondary | ICD-10-CM

## 2014-08-27 MED ORDER — CHOLECALCIFEROL 25 MCG (1000 UT) PO TABS
1000.0000 [IU] | ORAL_TABLET | Freq: Every day | ORAL | Status: DC
Start: 2014-08-27 — End: 2015-08-27

## 2014-08-27 MED ORDER — TRAZODONE HCL 50 MG PO TABS
100.0000 mg | ORAL_TABLET | Freq: Every evening | ORAL | Status: DC
Start: 2014-08-27 — End: 2015-05-27

## 2014-08-27 NOTE — Progress Notes (Signed)
SUBJECTIVE  Mariah [name] is a 58 year old female, [place] speaker, for FU/concerns    #1) Gastritis  Continues having episodes of epigastric pain  Has hiatal hernia, and hx of duodenal ulcer   She's taking PPIs , per Dr Marcelene Butte instructions  Overall, she feels that sx are more manageable since she started drinking herbal teas, and other home remedies  No n/v/diarrhea. No melena    #2) Body aches/joint pains  Had to cancel appt with Dr Lorane Gell d/t work schedule, but appt is rescheduled for 9/18  We reviewed recent labs, and reason to see Dr Lorane Gell, given her continuous complaint of generalized body aches, mostly joint pain    #3) HTN  Pt reports that he takes his medications daily. Denies chest pain, SOB, DOE, palpitations, dizziness, head aches, leg edema.   Most Recent BP Reading(s)  09/04/14 : 158/93  08/27/14 : 164/86  07/06/14 : 185/85  06/09/14 : 155/82  05/04/14 : 172/88    OBJECTIVE:  BP 164/86   Pulse 68   Temp(Src) 97.5 F (36.4 C)   Ht 5\' 3"  (1.6 m)   Wt 92.987 kg (205 lb)   BMI 36.32 kg/m2   SpO2 98%  Pleasant, in NAD    Heart: S1 and S2 normal, no murmurs, clicks, gallops or rubs. Regular rate and rhythm.   Lungs:  clear; no wheezes, rhonchi or rales.  Abdomen: soft, bs in 4 quadrants, not distended, no guarding, no masses, no tenderness, no organomegaly, no suprapubic or epigastric pain    ASSESSMENT & PLAN:  (535.50) Gastritis  (primary encounter diagnosis)  Comment: Sx improved since she started drinking herbal teas, and other home remedies  -- continue PPIs  -- avoid triggers  -- r/o h.pylori, pos in 2007; neg on EGD in 2014. Repeat  -- No aggravating sx with regular ibuprofen for body aches  Plan: [place] (INHOUSE)           (780.52) Insomnia, unspecified  Comment: refill  Plan: traZODone (DESYREL) 50 MG tablet            (268.9) Vitamin  deficiency  Comment: 21, in June. Continue daily supplementation  Plan: cholecalciferol (VITAMIN D3) 1000 UNIT tablet          (729.1) Myalgia  Comment: Moderate relief on ibuprofen, without aggravation of epigastric pain  Plan: keep rescheduled appt with Dr Lorane Gell 9/18     The patient was ready to learn and no apparent learning or adherence barriers were identified. I explained the diagnosis and treatment plan, and the patient expressed understanding of the content. I attempted to answer any questions regarding the diagnosis and the proposed treatment.    We discussed the patients current medications. The patient expressed understanding and no barriers to adherence were identified.    follow-up will be scheduled prn  she has been advised to call or return with any worsening or new problems

## 2014-09-04 ENCOUNTER — Ambulatory Visit (HOSPITAL_BASED_OUTPATIENT_CLINIC_OR_DEPARTMENT_OTHER): Payer: No Typology Code available for payment source | Admitting: Internal Medicine

## 2014-09-04 VITALS — BP 158/93 | HR 76 | Ht 64.0 in | Wt 206.0 lb

## 2014-09-04 DIAGNOSIS — M79641 Pain in right hand: Secondary | ICD-10-CM

## 2014-09-04 DIAGNOSIS — M79642 Pain in left hand: Secondary | ICD-10-CM

## 2014-09-04 DIAGNOSIS — M659 Synovitis and tenosynovitis, unspecified: Secondary | ICD-10-CM

## 2014-09-04 DIAGNOSIS — M25441 Effusion, right hand: Secondary | ICD-10-CM

## 2014-09-04 DIAGNOSIS — M791 Myalgia, unspecified site: Secondary | ICD-10-CM

## 2014-09-04 LAB — CBC WITH PLATELET
HEMATOCRIT: 39.1 % (ref 34.1–44.9)
HEMOGLOBIN: 12.7 g/dL (ref 11.2–15.7)
MEAN CORP HGB CONC: 32.5 g/dL (ref 31.0–37.0)
MEAN CORPUSCULAR HGB: 27.1 pg (ref 26.0–34.0)
MEAN CORPUSCULAR VOL: 83.5 fL (ref 80.0–100.0)
MEAN PLATELET VOLUME: 10.1 fL (ref 8.7–12.5)
PLATELET COUNT: 302 10*3/uL (ref 150–400)
RBC DISTRIBUTION WIDTH STD DEV: 42.8 fL (ref 35.1–46.3)
RBC DISTRIBUTION WIDTH: 14.1 % (ref 11.5–14.3)
RED BLOOD CELL COUNT: 4.68 M/uL (ref 3.90–5.20)
WHITE BLOOD CELL COUNT: 7.1 10*3/uL (ref 4.0–11.0)

## 2014-09-04 LAB — ESTIMATED GLOMERULAR FILT RATE: ESTIMATED GLOMERULAR FILT RATE: >60 mL/min (ref >60)

## 2014-09-04 LAB — RBC SEDIMENTATION RATE: RBC SEDIMENTATION RATE: 35 MM/HR — ABNORMAL HIGH (ref 0–15)

## 2014-09-04 LAB — C-REACTIVE PROTEIN: C-REACTIVE PROTEIN: 0.8 mg/dL (ref 0.0–1.8)

## 2014-09-04 LAB — CREATININE: CREATININE: 0.5 mg/dL (ref 0.4–1.2)

## 2014-09-04 LAB — CREATINE KINASE TOTAL: CREATINE KINASE TOTAL: 194 IU/L (ref 26–192)

## 2014-09-04 MED ORDER — METHYLPREDNISOLONE ACETATE 80 MG/ML IJ SUSP
80.00 mg | Freq: Once | INTRAMUSCULAR | Status: AC
Start: 2014-09-04 — End: 2014-09-04

## 2014-09-04 MED ORDER — LIDOCAINE HCL 1 % IJ SOLN
0.20 mL | Freq: Once | INTRAMUSCULAR | Status: AC
Start: 2014-09-04 — End: 2014-09-04

## 2014-09-04 NOTE — Progress Notes (Signed)
58 yo female with ([place])- over one year- had pain in past but worse in past one year- intensity worsened and fatigue    1 year of body pain- intermittent- last for days x 1 year- could not report how often- history changes--->daily, all day long, severity changes  Fatigued   Poor sleep  Left knee - has been swollen  Right hand- has been swollen- right 4th MCP, stiff  Myalgia  Hurts when sits or stands- movement of changing position- anterior thighs, knees, posterior calves- changes- can not decide      Ibuprofen/Advil 800 helps when she takes it- bid, up to tid  Aleve    Atorvastatin- $RemoveBefore'20mg'LOgZtnPOiZTMn$ - stopped 3.5 months ago    Anxious  Poor sleep  Fatigue- end of day worse  Able to clean houses  Mild knee oa- x-ray  l spine x-ray normal  Osteoporosis- bmd 12/2007  Cardiomegaly  Vit d def  hepatosteatosis- liver bx  telangiect  Duodenal ulcer  gastritis      Ana neg x 2, ccp neg, esr 27  D -21  inc lfts          PE: right 4th mcp tender      Mother died 7 days ago- teary eyed      A/P  Right 4th- finger FT  The primary progress note for this visit has been dictated through E-Scription. It can be viewed as an attachment to this encounter or through Chart Review under the Other Tab as an Rheumatology Office Note.      Review of Systems: Constitutional, Eyes, ENT/Mouth, Cardiovascular, Respiratory, GI, GU, Neuro, Psych, Heme/Lymph, Skin, Musculoskeletal was reviewed and is NEGATIVE except for what is dictated in the note.    MT ACCT #:  [id number]  PATIENT/PROCEDURE VERIFICATION DOCUMENTATION    Correct patient: Yes  Correct procedure: Yes  Correct site, mark visible if applicable: Yes    Pre-procedure Vital Signs:  Refer to vitals section of visit     Risks and benefits reviewed included but not limited to: discomfort, bleeding, infection, nerve injury, atrophy, hypo/hyper-pigmentation, and ineffectiveness were discussed.    Side: Right 4th finger    MEDICATION DOSE VOLUME   Xylocaine 1% - epinephrine free  ------------- .66ml   Bupivicaine 0.5% - epinephrine free ------------- ml   Depo-medrol $RemoveBefo'20mg'IsvvwJFFgqc$ /ml mg ml   Depo-medrol $RemoveBefo'40mg'xToLZoMdDzC$ /ml mg ml   Depo-medrol $RemoveBefo'80mg'pqhYJVNHZgc$ /ml mg .25ml   Kenalog $Remove'40mg'cGYZRzm$ /ml mg ml   Dexamethasone $RemoveBefore'10mg'RZXwoWCTsUeRs$ /ml mg ml   Other:  mg ml     Dr. Bryson Corona performed the injection.  I was present.  No complications.      Time-out completed, documented by provider doing procedure or designated   team member:  Deliah Goody, MD    09/04/2014    10:06 AM

## 2014-09-04 NOTE — Progress Notes (Signed)
Date of Service: 09/04/2014    HISTORY OF PRESENT ILLNESS:  I was asked to consult on this patient in regards to pain.    She describes pain for many years, worse over the past year with worsening fatigue.  The patient is a poor historian and she gives an inconsistent story.     Initially, she describes intermittent pain lasting for a few days at a time. Then she describes daily pain with severity fluctuating throughout the day.  She feels fatigue and has poor sleep.  She does not feel depressed.  She is emotionally labile during the visit (mother died 7 days ago).  She is describing intermittent left knee pain, which has been swollen in the past.  She describes swelling of the right fourth finger that gets stuck in a flexed position, and it is painful to open.  She describes diffuse myalgias.  She anterior thigh pain, posterior calf pain, and knee pain.      She was on atorvastatin 20 mg, but discontinued it 3-1/2 months ago.  She takes ibuprofen 800 mg 1-2 times a day, which relieves her pain for a certain number of hours.      The patient is still able to work in Aeronautical engineer.    PAST MEDICAL HISTORY:   1.  Anxiety, poor sleep, fatigue, mild knee osteoarthritis seen on x-ray.  2.  Osteoporosis (bone mineral density January 2009).   3.  Cardiomegaly seen on previous imaging.  4.  Low vitamin D.  5.  Obesity.  6.  Hepatic steatosis, status post liver biopsy.  7.  Diffuse telangiectasias.  8.  History of duodenal ulcer without hemorrhage, history of gastritis.    9.  Schatzki's ring.  10.  Subclavian artery stenosis.  11.  History of renal stone.    ALLERGIES AND ADVERSE REACTIONS:   HEPARIN and LISINOPRIL.    CURRENT MEDICATIONS:  Biotin, vitamin D, ibuprofen, losartan, omeprazole, trazodone.      SOCIAL HISTORY:  The patient is right-hand dominant.  She works in Aeronautical engineer.  She performs her ADLs.  She feels safe at home.  She has a history of alcohol use.  She denies recreational drug use.  She has occasional  alcohol use.    REVIEW OF SYSTEMS:  She describes problems with memory problems, concentration, palpitations, dyspnea, heartburn, alopecia, subjective weakness, and easy bruising.      PHYSICAL EXAMINATION:  VITAL SIGNS:  Height 5 feet 4 inches, weight 206 pounds, BMI 35, blood pressure 158/93, pulse 76, SpO2 98%, pain 5/10.  GENERAL:  She is in no acute distress.  She is alert and oriented.  Good eye contact.  Full affect.  No malar rash.  HEENT:  Oropharynx clear.  NECK:  Supple.  LUNGS:  Clear.  EXTREMITIES:  No pitting edema.  MUSCULOSKELETAL:  Tenderness over the volar surface of the right fourth MCP.  There is no swelling in the hands.  She has tenderness in the right second, third, and fourth MCP without swelling.  No swelling at the wrists.  Full range of motion of elbows, shoulders.  No pain on palpation of the forearms, upper arms.  She has mild tenderness to palpation of the chest diffusely.  No pain on palpation of the upper back.  Full range of motion in the hips.  No knee effusions.  No ankle swelling.  No dactylitis.  Tenderness on palpation diffusely in the left lower leg.    LABORATORY DATA:    ANA negative x2  Rheumatoid factor negative  Creatinine 0.6, GFR greater than 60  Alkaline phosphatase 207  AST 40, ALT 67    Vitamin D 21   WBC 7.3; hematocrit 37.6; platelets 305,000    ASSESSMENT:  A 58 year old woman with diffuse body pain.  She is a poor historian and it is very difficult for me to get a concise history.  PMR is on the differential, although I think this is more likely a potential for diffuse pain syndrome, such as fibromyalgia syndrome.  She has flexor tenosynovitis of the right fourth finger.  She has knee pain in the setting of osteoarthritis.  Ibuprofen seems to help her pain.      She has GERD and a history of gastritis, and has elevated LFTs.  She has been tolerating her ibuprofen.  F    Plan:  or a short period of time, I think it is reasonable to try to take the ibuprofen 3 times  a day to see if it significantly improves her pain.  If not, we can consider a trial of prednisone.    She was given a glucocorticoid injection for her right fourth flexor tenosynovitis.  Hopefully, this gives her some relief.    Given that she reports swelling in the finger, I will check a CCP antibody.    I would like to check a CK, given her myalgia.    I will see her back in approximately 6 weeks, earlier if needed.    The patient verbalized an understanding and agreement with this plan.    ___________________________  Reviewed and Electronically Signed By: Coolidge Breeze MD  Sig Date: 12/22/2014  Sig Time: 13:11:52  Dictated By: Coolidge Breeze MD  Dict Date: 09/04/2014 Dict Time: 10 06 AM    Dictation Date and Time:09/04/2014 10:06:14  Transcription Date and Time:09/04/2014 14:07:01  eScription Dictation id: [id number] Confirmation # :[id number]      cc: Lawernce Pitts APRN

## 2014-09-07 LAB — CCP ANTIBODIES IGG/IGA: CCP ANTIBODIES IGG/IGA: 1 units (ref 0–19)

## 2014-09-29 ENCOUNTER — Encounter (HOSPITAL_BASED_OUTPATIENT_CLINIC_OR_DEPARTMENT_OTHER): Payer: Self-pay | Admitting: Internal Medicine

## 2014-10-15 ENCOUNTER — Ambulatory Visit (HOSPITAL_BASED_OUTPATIENT_CLINIC_OR_DEPARTMENT_OTHER): Payer: No Typology Code available for payment source | Admitting: Gastroenterology

## 2014-10-15 VITALS — BP 142/88 | HR 80 | Temp 98.4°F | Resp 20 | Ht 64.0 in | Wt 209.0 lb

## 2014-10-15 DIAGNOSIS — R945 Abnormal results of liver function studies: Principal | ICD-10-CM

## 2014-10-15 DIAGNOSIS — R7989 Other specified abnormal findings of blood chemistry: Secondary | ICD-10-CM

## 2014-10-15 DIAGNOSIS — R1013 Epigastric pain: Secondary | ICD-10-CM

## 2014-10-15 LAB — HEPATIC FUNCTION PANEL
ALANINE AMINOTRANSFERASE: 44 U/L (ref 12–45)
ALBUMIN: 3.8 g/dL (ref 3.4–5.0)
ALKALINE PHOSPHATASE: 172 U/L — ABNORMAL HIGH (ref 45–117)
ASPARTATE AMINOTRANSFERASE: 21 U/L (ref 8–34)
BILIRUBIN DIRECT: 0.1 mg/dl (ref 0.0–0.2)
BILIRUBIN TOTAL: 0.2 mg/dL (ref 0.2–1.0)
INDIRECT BILIRUBIN: 0.1 mg/dL — ABNORMAL LOW (ref 0.2–0.9)
TOTAL PROTEIN: 7.3 g/dL (ref 6.4–8.2)

## 2014-10-15 NOTE — Progress Notes (Signed)
Dict                     Primary note dictated through E-Scription.  See "Notes" tab in Chart Review for transcribed note; final note also appears within encounter.    I have spent  minutes in face to face time with this patient/patient proxy of which > 50% was in counseling or coordination of care regarding above issues/Dx.      Patient understands and agrees with the plan

## 2014-10-19 LAB — ANTI-MITOCHONDRIAL ANTIBODY: ANTI-MITOCHONDRIAL ANTIBODY: 15.8 Units (ref 0.0–20.0)

## 2014-10-21 NOTE — Progress Notes (Signed)
Date of Service: 10/15/2014    HISTORY OF PRESENT ILLNESS:  [name] was seen in followup for her epigastric pain and heartburn as well as her abnormal liver function tests.    She continues to have heartburn, but less epigastric pain.  Unfortunately, she takes ibuprofen regularly for knee pain.  She had an endoscopy in November 2013, which showed a hiatus hernia, and a nonobstructing Schatzki ring, erosive gastropathy,  a duodenal ulcer, and duodenal erosions.  She was advised not to take NSAIDs; however, she says that the knee pain does not respond to anything else.  The heartburn is not associated with any dysphagia anymore.    As for the abnormal liver function tests, she had a liver biopsy in May 2015, and this showed mild-to-moderate portal, periportal, and lobular inflammation with mostly mononuclear cells.  There is also mild micro to microvesicular steatosis, involving 10% to 20% of the biopsy.  Trichome stain shows minimal periportal fibrosis.  The pathologist noted that there was no significant bile duct damage or any granulomas identified, and in his differential the pathology includes atoxic/metabolic injury and favors nonalcoholic steatohepatitis.    The patient does not complain of excessive fatigue, right upper quadrant pain, or dark urine.  All her immunologic serology and viral serology has been negative.  the mitochondrial, antimitochondrial antibody, which was equivocal in early 2014 was negative later in October 2015.      PAST MEDICAL HISTORY:  Obesity, duodenal ulcer, elevated alkaline phosphatase and aminotransferases, kidney stone, duodenal ulcer, gastritis, hiatus hernia, Schatzki ring, and knee pain.     MEDICATIONS:  Atorvastatin, omeprazole, ibuprofen, Cozaar, trazodone, vitamin D3.     ALLERGIES:  Heparin and lisinopril.    PHYSICAL EXAMINATION:  GENERAL:  She is in no distress.  VITAL SIGNS:  Blood pressure 142/88, pulse 80, temperature 98.4, oxygen saturation 98%, and weight 209  pounds.  HEAD AND ENT:  She has no icterus or pallor.  HEART:  Rhythmic and regular with a systolic murmur.  LUNGS:  Clear.  ABDOMEN:  Soft with no organomegaly, masses, or tenderness.  EXTREMITIES:  No edema.  SKIN:  No lesions.    ASSESSMENT AND PLAN:  I relayed to the patient the results of her liver biopsy, and I told her that there is no clear etiology as to the patient's elevated liver function tests.  I will check the antimitochondrial antibody again, but I do not have enough evidence to make a definite diagnosis of nonalcoholic steatohepatitis, PBC, or autoimmune hepatitis.    The patient was interviewed with the help of a video conference interpreter.    ___________________________  Reviewed and Electronically Signed By: Junius Argyle MD  Sig Date: 10/22/2014  Sig Time: 13:04:06  Dictated By: Junius Argyle MD  Dict Date: 10/21/2014 Dict Time: 09 57 AM    Dictation Date and Time:10/21/2014 09:57:38  Transcription Date and Time:10/21/2014 10:35:00  eScription Dictation id: [id number] Confirmation # :[id number]      cc: [place]

## 2014-10-27 LAB — CT BIOPSY LIVER

## 2014-11-02 ENCOUNTER — Ambulatory Visit (HOSPITAL_BASED_OUTPATIENT_CLINIC_OR_DEPARTMENT_OTHER): Payer: No Typology Code available for payment source | Admitting: Internal Medicine

## 2014-11-09 ENCOUNTER — Ambulatory Visit (HOSPITAL_BASED_OUTPATIENT_CLINIC_OR_DEPARTMENT_OTHER): Payer: No Typology Code available for payment source | Admitting: Ophthalmology

## 2014-11-18 ENCOUNTER — Ambulatory Visit (HOSPITAL_BASED_OUTPATIENT_CLINIC_OR_DEPARTMENT_OTHER): Payer: No Typology Code available for payment source | Admitting: Gastroenterology

## 2014-12-05 ENCOUNTER — Emergency Department (HOSPITAL_BASED_OUTPATIENT_CLINIC_OR_DEPARTMENT_OTHER)
Admission: RE | Admit: 2014-12-05 | Disposition: A | Discharge status: Home / Self Care | Payer: Self-pay | Source: Emergency Department | Attending: Emergency Medicine | Admitting: Emergency Medicine

## 2014-12-05 ENCOUNTER — Encounter (HOSPITAL_BASED_OUTPATIENT_CLINIC_OR_DEPARTMENT_OTHER): Payer: Self-pay

## 2014-12-05 MED ORDER — ACETAMINOPHEN 325 MG PO TABS
650.00 mg | ORAL_TABLET | Freq: Once | ORAL | Status: AC
Start: 2014-12-05 — End: 2014-12-05
  Administered 2014-12-05: 650 mg via ORAL
  Filled 2014-12-05: qty 2

## 2014-12-05 MED ORDER — DIPHENHYDRAMINE HCL 50 MG/ML IJ SOLN
25.00 mg | Freq: Once | INTRAMUSCULAR | Status: AC
Start: 2014-12-05 — End: 2014-12-05
  Administered 2014-12-05: 25 mg via INTRAVENOUS
  Filled 2014-12-05: qty 1

## 2014-12-05 MED ORDER — CETIRIZINE HCL 10 MG PO TABS
10.00 mg | ORAL_TABLET | Freq: Every day | ORAL | Status: AC
Start: 2014-12-05 — End: 2014-12-10

## 2014-12-05 MED ORDER — FAMOTIDINE 20 MG/2ML IV SOLN
20.00 mg | Freq: Once | INTRAVENOUS | Status: AC
Start: 2014-12-05 — End: 2014-12-05
  Administered 2014-12-05: 20 mg via INTRAVENOUS
  Filled 2014-12-05: qty 2

## 2014-12-05 NOTE — Discharge Instructions (Signed)
Urticria   (Hives)   As urticrias so reas que coam, ficam vermelhas e inchadas na pele. Elas podem variar em tamanho e local em seu corpo. Quando as urticrias aparecem podem durar horas ou dias (urticria aguda) ou algumas semanas (urticria crnica). A urticria no se espalha de pessoa para pessoa (no contagiosa). Elas podem piorar ao arranh-las, ao fazer exerccios e com o estresse emocional.  CAUSAS    Reao alrgica a alimentos, complementos alimentares ou medicamentos.   Infeces, incluindo o resfriado comum.   Doenas, como a vasculite, o lpus ou a Training and development officer tireoide.   Exposio  luz solar, calor ou frio.   Exerccios.   Tobey Grim.   Contato com produtos qumicos.  SINTOMAS    reas inchadas vermelhas ou brancas na pele. Essas reas podem mudar de tamanho, forma e local rpida e repetidamente.   Coceira.   Inchao das mos, ps e rosto. Isto pode ocorrer se a Estate agent profundamente na pele.  DIAGNSTICO   Seu mdico pode diagnosticar o problema realizando um exame fsico. Exames de pele ou de sangue tambm podem ser feitos para determinar a causa de sua urticria. Em alguns casos, a causa no pode ser determinada.   TRATAMENTO   Casos leves geralmente melhoram com medicamentos como anti-histamnicos. Casos severos podem precisar de uma injeo de emergncia de epinefrina. Se a causa da urticria for conhecida, o tratamento inclui evitar o agente disparador.   INSTRUES PARA TRATAMENTO DOMICILIAR    Evite as causas que disparam sua urticria.   Tome os anti-histamnicos conforme orientado por seu mdico para reduzir sua severidade. Anti-histamnicos no sedativos ou com baixo nvel sedativo so geralmente recomendados. No dirija enquanto estiver tomando um anti-histamnico.   Tome outros medicamentos prescritos para coceira conforme instrudo pelo seu mdico.   Vista roupas largas.   Faa todas as consultas, conforme orientado pelo seu mdico.  PROCURE UM MDICO SE:     Tiver uma coceira persistente ou severa que no se alivia com os medicamentos.   Tiver articulaes doloridas ou inchadas.  PROCURE UM MDICO IMEDIATAMENTE SE:    Tiver febre.   Sua lngua ou lbios incharem.   Tiver problemas ao Chiropractor.   Sentir aperto na garganta ou no peito.   Tiver dores abdominais.  Esses problemas podem ser o primeiro sinal de uma reao alrgica fatal. Ligue para os servios de Engineer, building services (911 nos EUA).  CERTIFIQUE-SE DE:    Compreender estas instrues.   Observar as suas condies.   Procurar um mdico imediatamente se no se sentir bem ou piorar.  Document Released: 12/04/2005 Document Revised: 06/04/2012  [facility] Patient Information 2014 [place], [place].

## 2014-12-05 NOTE — Narrator Note (Signed)
Patient Disposition    Patient education for diagnosis, medications, activity, diet and follow-up.  Patient left ED 7:38 PM.  Patient rep received written instructions.  Interpreter to provide instructions: No    Patient belongings with patient: YES    Have all existing LDAs been addressed? N/A    Have all IV infusions been stopped? Yes    Discharged to: Discharged to home. Pt given d/c paperwork, given rx, instructed to f/u with pcp, reviewed s/s to return to ed for, pt verbalizes understanding, states no other questions or concerns, pt ambulatory at dc.

## 2014-12-05 NOTE — ED Provider Notes (Signed)
ED nursing record was reviewed. Prior records as available electronically through the Epic record were reviewed.      HPI:    This 58 year old female patient presents to the Emergency Department with chief complaint of rash.  Patient started having an itchy rash 1 hour ago.  It is all over her body especially in her hands and feet.  She took a Zyrtec without any improvement.  Mouth or throat swelling.  No trouble breathing.  Mild nausea without vomiting.  Mild headache.  No diarrhea.  She's never had this happen before.  She had shrimp approximately 5 hours ago but she has had that before without a problem.  No new medications.      ROS: Pertinent positives were reviewed as per the HPI above. All other systems were reviewed and are negative.      Past Medical History/Problem list:    Past Medical History    Screening for hypertension 08/05/2004    Comment: occ elevated BP started on anti-HTN in [place]; chem 7, EKG, HCT u/a wnl 4/03; d/c'ed HCTZ 8/05 when we learned from daughter she has not been taking it    ANXIETY DEPRESSION 08/05/2004    Comment: TSH wnl 8/02; trial of fluoxetine 8/05    PERS HX TOBACCO USE 08/05/2004    Comment: quit 10/02; 1/2 PPD since 58 YO    BACK PAIN LOW 08/05/2004    Comment: LBP musculoskeletal most likely 1/04    OBESITY NOS 08/05/2004    Comment: has tried meds from [place]    PERS HX OF PAST NONCOMPLIANCE 08/05/2004    Comment: poor compliance to med regimen    Hyperparathyroidism 10/22/2006    Comment: osteoporosis found - pt referred to surgery for the hyperparathyroidism- s/p parathyroidectomy feb 2008    Vitamin D deficiency 05/19/2008    Comment: Supplement per dr Jake Samples.    Diarrhea 03/27/2006    Comment: 7/07 The colonoscopy was visually normal and biopsies taken to rule out microscopic colitis were negative.  A biopsy of the descending duodenum to rule out celiac sprue because of the diarrhea was negative.      ABN FIND-STOOL CONTENTS-OCC BLOOD 03/27/2006    Comment: s/p  colonoscopy and EGD 7/07    Cough 04/23/2006    Comment: s/p PFTs September 2007 --  some obstruction at mid to low lung volumes not correctable by bronchodilator CT scan showed stable pulmonary nodules over several years 11/07 - cough gone ** note combined with dx of "nodules on CT scan". Will file this note to hx.       ABN FIND-STOOL CONTENTS-OCC BLOOD 03/27/2006    Comment: s/p colonoscopy and EGD 7/07    Cough 04/23/2006    Comment: s/p PFTs September 2007 --  some obstruction at mid to low lung volumes not correctable by bronchodilator CT scan showed stable pulmonary nodules over several years 11/07 - cough gone ** note combined with dx of "nodules on CT scan". Will file this note to hx.       Diarrhea 03/27/2006    Comment: 7/07 The colonoscopy was visually normal and biopsies taken to rule out microscopic colitis were negative.  A biopsy of the descending duodenum to rule out celiac sprue because of the diarrhea was negative.      Gastritis 12/19/2012    Comment: Combined with entry "esophageal reflux      depressive disorder 08/05/2004    Comment: TSH wnl 8/02; trial of fluoxetine x 6 wks didn't  really help 8/05  trial of citalopram per Dr. Patrice Paradise. later resolved. 04/15/2014 - Reassessed, pt reports feeling well, with no sx of depression. Just reports being tired, which she attributes to current life style. Will resolve from problem list.      Inconclusive mammogram 06/18/2012    Comment: June 2013 - BIRADS 3. Probable benign mammogram. Recommend 6 month  follow-up mammogram (Dec 2013).  Dec/13 - Stable left breast nodule with no evidence of malignancy. BIRADS 2 benign recommend annual screening mammogram in 6 months  (June 2014) June/14 - mammogram request given to pt, she will call to schedule:  Jan/15 - normal mammogram, yearly screening. Resolve from problem list.          nodules on chest CT 1/06 and 3/07 03/27/2006    Comment: s/p PFTs September 2007 --  some obstruction at mid to low lung volumes not correctable  by bronchodilator CT scan showed stable pulmonary nodules over several years 11/07 - cough gone  04/29/12 - saw pneumologist, Dr Jordan Hawks, "Will repeat chest CT with high-resolution scans at the 2 month mark (already ordered). If abnormal, would do PFT's and further rheum w/u, consider lung bx . Pt to call if any r    ABSENCE OF MENSTRUATION - s/p TAH BSO 06/04/2006     Patient Active Problem List:     Obesity, unspecified     LATERAL FOOT PAIN     Benign hypertensive heart disease without heart failure     Alkaline phosphatase elevation     Insomnia, unspecified     Calculus of kidney     Gastritis     Allergic rhinitis due to other allergen     Osteoporosis     Murmur heart     Family History of Throat Cancer     Vitamin D deficiency     Subclavian artery stenosis, left     Schatzki's ring     Duodenal ulcer, unspecified as acute or chronic, without hemorrhage, perforation, or obstruction              Past Surgical History:     Past Surgical History    TOTAL ABDOMINAL HYSTERECT W/WO RMVL TUBE OVARY  5/00    Comment Hysterectomy, Total Abdominal secondary to fibroids    SALPINGO-OOPHORECTOMY COMPL/PRTL UNI/BI SPX  5/00    Comment Salpingo-Oophorectomy, bilateral    PARATHYROIDECTOMY  2006    Comment partial,     TONSILLECTOMY ONE-HALF            Medications:   No current facility-administered medications on file prior to encounter.  Current Outpatient Prescriptions on File Prior to Encounter:  cholecalciferol (VITAMIN D3) 1000 UNIT tablet Take 1 tablet by mouth daily. Disp: 30 tablet Rfl: 11   traZODone (DESYREL) 50 MG tablet Take 2 tablets by mouth nightly. Disp: 30 tablet Rfl: 5   losartan (COZAAR) 50 MG tablet Take 2 tablets po in the morning Disp: 60 tablet Rfl: 5   omeprazole (PRILOSEC) 20 MG capsule Take 1 capsule by mouth 2 (two) times daily. Disp: 60 capsule Rfl: 11   ibuprofen (ADVIL,MOTRIN) 200 MG tablet Take 200 mg by mouth every 6 (six) hours as needed for Pain. Disp:  Rfl:    Biotin 1000 MCG TABS Tablet  Take 1,000 mcg by mouth daily. Disp:  Rfl:    atorvastatin (LIPITOR) 20 MG tablet Take 1 tablet by mouth daily. Disp: 30 tablet Rfl: 2         Social History:  Social History   Marital Status: Legally Separated  Spouse Name: N/A    Years of Education: N/A  Number of Children: N/A     Occupational History  None on file     Social History Main Topics   Smoking status: Former Smoker  0.50 Packs/Day  For 40.00 Years     Types: Cigarettes    Quit date: 03/31/2012    Smokeless tobacco: Former Systems developer    Comment: 3-4 cigarretes a day. Quit about 1 month ago    Alcohol Use: Yes    Comment: very occasional    Drug Use: No    Sexual Activity: Yes    Partners: Male    Comment: histerectomy     Other Topics Concern   None on file     Social History Narrative    work: Engineer, building services; owned bakery in [place]    Lives w/ daughter 24 YO (Grazielle Stayton) & son-in-law Lowella Dandy)    Divorced; currently not in a relationship           Allergies:  Review of Patient's Allergies indicates:   Heparin                     Comment:local rash seen during hospitalization Feb 2008   Lisinopril              Cough      Physical Exam:  BP 162/103 mmHg   Pulse 91   Temp(Src) 97.9 F   Resp 16   Wt 94.802 kg (209 lb)   SpO2 96%    GENERAL: No acute distress, non-toxic.   SKIN: Diffuse urticarial rash  HEAD:  NCAT. Sclerae are anicteric and aninjected, oropharynx is clear with moist mucous membranes.     NECK:  Supple.  No meningismus.    LUNGS:  Clear to auscultation bilaterally.   HEART:  RRR.   ABDOMEN:  Soft, NTND.  No involuntary guarding or rebound.   EXTREMITIES:  No obvious deformities.   NEUROLOGIC:  Alert, moves all extremities well; speaking clearly.   PSYCHIATRIC:  Appropriate for age, time of day, and situation        ED Course and Medical Decision-making:  This 58 year old female patient presents with urticaria.  No signs of anaphylaxis.  She was given Benadryl and Pepcid IV.  This helped itching although she did have some shaking after  she got the medications.  This resolved without further treatment.  She was given Tylenol for her headache and continued do well.  Rash had improved on repeat exam.      Condition: Improved, stable  Disposition: Discharge      Diagnosis/Diagnoses:  urticaria    Azalia Bilis, MD

## 2014-12-05 NOTE — ED Triage Note (Signed)
Pt self presents to ED, ambulatory to room, alert and oriented x 4.   Pt reports generalized itching, believes she is having an allergic reaction to food she was eating 1 hour ago, possibly to the shrimp although this is not a known allergy.   Took a Zyrtec with little relief.

## 2014-12-29 ENCOUNTER — Other Ambulatory Visit (HOSPITAL_BASED_OUTPATIENT_CLINIC_OR_DEPARTMENT_OTHER): Payer: Self-pay | Admitting: Internal Medicine

## 2014-12-29 DIAGNOSIS — Z1239 Encounter for other screening for malignant neoplasm of breast: Secondary | ICD-10-CM

## 2015-01-06 ENCOUNTER — Ambulatory Visit (HOSPITAL_BASED_OUTPATIENT_CLINIC_OR_DEPARTMENT_OTHER): Payer: No Typology Code available for payment source | Admitting: Gastroenterology

## 2015-01-06 ENCOUNTER — Ambulatory Visit (HOSPITAL_BASED_OUTPATIENT_CLINIC_OR_DEPARTMENT_OTHER): Payer: Self-pay | Admitting: Internal Medicine

## 2015-01-06 DIAGNOSIS — Z1239 Encounter for other screening for malignant neoplasm of breast: Secondary | ICD-10-CM

## 2015-01-07 LAB — MA SCREENING MAMMO BILATERAL WITH CAD

## 2015-01-12 ENCOUNTER — Encounter (HOSPITAL_BASED_OUTPATIENT_CLINIC_OR_DEPARTMENT_OTHER): Payer: Self-pay | Admitting: Internal Medicine

## 2015-01-14 ENCOUNTER — Ambulatory Visit (HOSPITAL_BASED_OUTPATIENT_CLINIC_OR_DEPARTMENT_OTHER): Payer: No Typology Code available for payment source | Admitting: Internal Medicine

## 2015-01-14 VITALS — BP 156/76 | HR 79 | Temp 98.5°F | Ht 64.0 in | Wt 213.6 lb

## 2015-01-14 DIAGNOSIS — I119 Hypertensive heart disease without heart failure: Secondary | ICD-10-CM

## 2015-01-14 DIAGNOSIS — K219 Gastro-esophageal reflux disease without esophagitis: Secondary | ICD-10-CM

## 2015-01-14 DIAGNOSIS — R5383 Other fatigue: Secondary | ICD-10-CM

## 2015-01-14 DIAGNOSIS — L659 Nonscarring hair loss, unspecified: Secondary | ICD-10-CM

## 2015-01-14 DIAGNOSIS — E785 Hyperlipidemia, unspecified: Secondary | ICD-10-CM

## 2015-01-14 DIAGNOSIS — E559 Vitamin D deficiency, unspecified: Secondary | ICD-10-CM

## 2015-01-14 DIAGNOSIS — R5381 Other malaise: Secondary | ICD-10-CM

## 2015-01-14 DIAGNOSIS — E669 Obesity, unspecified: Secondary | ICD-10-CM

## 2015-01-14 MED ORDER — LOSARTAN POTASSIUM 50 MG PO TABS
ORAL_TABLET | ORAL | Status: DC
Start: 2015-01-14 — End: 2015-04-13

## 2015-01-14 MED ORDER — MINOXIDIL 5 % EX FOAM
0.50 [IU] | Freq: Every day | CUTANEOUS | Status: AC
Start: 2015-01-14 — End: 2015-05-14

## 2015-01-14 MED ORDER — ATORVASTATIN CALCIUM 20 MG PO TABS
20.0000 mg | ORAL_TABLET | Freq: Every day | ORAL | Status: DC
Start: 2015-01-14 — End: 2015-05-27

## 2015-01-14 MED ORDER — OMEPRAZOLE 20 MG PO CPDR
20.0000 mg | DELAYED_RELEASE_CAPSULE | Freq: Two times a day (BID) | ORAL | Status: DC
Start: 2015-01-14 — End: 2016-03-01

## 2015-01-14 MED ORDER — VITAMIN D (ERGOCALCIFEROL) 1.25 MG (50000 UT) PO CAPS
1.0000 | ORAL_CAPSULE | ORAL | Status: DC
Start: 2015-01-14 — End: 2015-05-14

## 2015-01-14 NOTE — Patient Instructions (Signed)
[facility]   [facility]   [facility] [address]   [place], [place] [address]   [phone]   [phone]   [email or web address]  [facility] [place]  3rd Floor, [address]  [address]  [place], [place] [address]  Appointments  Call: [phone]  Fax: [phone]

## 2015-01-14 NOTE — Progress Notes (Signed)
SUBJECTIVE  Mariah [name] is a 59 year old female, [place] speaker, with the following concerns    #1) Alopecia  Female pattern, hair thinning  Has an aunt who is bald.  Would like something for it.    #2) Needs refill for most of her medications    #3) Fatigue  A chronic problem. She had low vit D in the past, 21 in July/15. Never supplemented, despite our conversation about it.   Works in Development worker, community. Self describes as very anxious, and with a high level of stress  Does not have any particular stress relieve mechanisms, except an occasional vacation  Wonders if fatigue can be related to weight    #4) Weight  This has been a chronic life problem. She tends to use diet pills, that she buys via Internet, or gets from [place]. She finds helpful int he short term, and fatigue and body aches tend to improve with the loss of weight, but then she gains the weight back.   Pt expresses frustration at this. She is willing to "take the long road" and seek professional help to lose weight.     #5) BP  Does not feel that current medication is effective in controlling her BP (Losartan 50 mg, BID)  Pt reports that she takes his medications daily. Denies chest pain, SOB, DOE, palpitations, dizziness, head aches, leg edema.   Most Recent BP Reading(s)  01/14/15 : 156/76  12/05/14 : 158/82  10/15/14 : 142/88  09/04/14 : 158/93  08/27/14 : 164/86    #6) Data review  -- Mammogram - wnl    OBJECTIVE:  BP 156/76 mmHg   Pulse 79   Temp(Src) 98.5 F (36.9 C) (Temporal)   Ht 5\' 4"  (1.626 m)   Wt 96.888 kg (213 lb 9.6 oz)   BMI 36.65 kg/m2   SpO2 100%  Pleasant, in NAD    HEENT: normocephalic, no lesions. Pattern of androgenous hair thinning  Heart: S1 and S2 normal, no murmurs, clicks, gallops or rubs. Regular rate and rhythm.   Lungs:  clear; no wheezes, rhonchi or rales.    ASSESSMENT & PLAN:  (I11.9) Benign hypertensive heart disease without heart failure  (primary encounter diagnosis)  Comment: Pt on stable therapy, but her BP  has always been in the 295 systolic.   Refer to pharmacotherapy  Plan: losartan (COZAAR) 50 MG tablet, REFERRAL TO         [facility] ( [facility])       (E55.9) Vitamin D deficiency  Comment: Last checked in July/21 = 21. Pt admits never starting supplementation. It's reasonable to think that the level will be lower. Pt declines blood test today.   Plan: Vitamin D, Ergocalciferol, 50000 UNITS capsule            (272.4) Hyperlipidemia  Comment: refull  Plan: atorvastatin (LIPITOR) 20 MG tablet            (K21.9) Gastroesophageal reflux disease, esophagitis presence not specified  Comment: refill  Plan: omeprazole (PRILOSEC) 20 MG capsule            (L65.9) Alopecia  Comment:   Plan: Minoxidil (ROGAINE WOMENS) 5 % FOAM        Foam, aerosol 5%: Apply 1/2 capful once daily  Topical: For topical administration on the scalp only; do not use on other parts of the body. Apply directly to the hair- thinning areas of scalp; massage into scalp with fingers; wash hands after application. Refer  to manufacturers labeling for additional administration instructions.  Foam: May melt upon contact with warm fingers; rinse fingers in cold water and thoroughly dry prior to use      (R53.81,  R53.83) Malaise and fatigue  Comment: this has also been an ongoing complaint for her. Unfortunately, she has been unable to implement any measures discussed in previous visits to implement self care, regular physical activity, healthy eating, and a more balanced life style. Her workup has always been neg, including evaluation by rheumatology and physiatry.   Plan: Pt declines further intervention. Declines to discuss the issue any further.    (E66.9) Non morbid obesity, unspecified obesity type  Comment: BMI 36.65, pt with long hx of yo-yo diets, including diet pills. We had extensively discussed this in the past, and I had advised her against any of those measures. I suspect that they also contribute to her fatigue and general feeling of  poor health.  Plan: [facility] and [facility] information given to patient. She will call for information, and request referral once the appts are made.    The patient was ready to learn and no apparent learning or adherence barriers were identified. I explained the diagnosis and treatment plan, and the patient expressed understanding of the content. I attempted to answer any questions regarding the diagnosis and the proposed treatment.    We discussed the patients current medications. The patient expressed understanding and no barriers to adherence were identified.    > 50% of this 25 minute visit was spent in face to face counseling and coordination of care    follow-up will be scheduled prn  she has been advised to call or return with any worsening or new problems

## 2015-01-20 ENCOUNTER — Encounter (HOSPITAL_BASED_OUTPATIENT_CLINIC_OR_DEPARTMENT_OTHER): Payer: Self-pay | Admitting: Gastroenterology

## 2015-01-20 ENCOUNTER — Ambulatory Visit (HOSPITAL_BASED_OUTPATIENT_CLINIC_OR_DEPARTMENT_OTHER): Payer: No Typology Code available for payment source | Admitting: Gastroenterology

## 2015-01-20 VITALS — BP 130/80 | HR 71 | Temp 97.2°F | Wt 217.2 lb

## 2015-01-20 DIAGNOSIS — R7989 Other specified abnormal findings of blood chemistry: Secondary | ICD-10-CM

## 2015-01-20 DIAGNOSIS — R945 Abnormal results of liver function studies: Principal | ICD-10-CM

## 2015-01-20 NOTE — Progress Notes (Signed)
This office note has been dictated. Account number [id number]

## 2015-01-21 NOTE — Progress Notes (Signed)
Date of Service: 01/20/2015    HISTORY OF PRESENT ILLNESS:  The patient was seen in followup for abnormal liver function tests and abdominal pain.    The patient was interviewed with the help of a [place] interpreter via telephone.    The patient has not had any abdominal pain since she was seen last towards the end of October 2015.  Interestingly, her liver function tests were better than when they were drawn last prior to the October encounter.  In April, the alkaline phosphatase had decreased from 207 to 172, the AST decreased from 48 to 21, and the ALT decreased from 67 to 44 with a normal albumin and bilirubin on both occasions.    PHYSICAL EXAMINATION:  Blood pressure 130/80, pulse 71, temperature 97.2, oxygen saturation 99%, and weight 217 pounds.    ASSESSMENT AND PLAN:  I explained to the patient again that her liver biopsy did not show any scarring and it is likely that the leading diagnosis is nonalcoholic steatohepatitis.  I stressed to her that the only treatment for this condition is to lose weight and I encouraged her to do that and told her that I would expect her to lose 10-15 pounds in the next 4 months, and I will see her in followup then.    The repeat antimitochondrial antibody, which was at one point borderline, is now negative on 2 consecutive occasions, so I doubt that we are dealing with primary biliary cirrhosis.    ___________________________  Reviewed and Electronically Signed By: Junius Argyle MD  Sig Date: 01/29/2015  Sig Time: 12:53:00  Dictated By: Junius Argyle MD  Dict Date: 01/21/2015 Dict Time: 12 00 PM    Dictation Date and Time:01/21/2015 12:00:30  Transcription Date and Time:01/21/2015 12:42:43  eScription Dictation id: [id number] Confirmation # :[id number]      cc: Lawernce Pitts APRN

## 2015-01-22 ENCOUNTER — Ambulatory Visit (HOSPITAL_BASED_OUTPATIENT_CLINIC_OR_DEPARTMENT_OTHER): Payer: No Typology Code available for payment source

## 2015-02-05 ENCOUNTER — Telehealth (HOSPITAL_BASED_OUTPATIENT_CLINIC_OR_DEPARTMENT_OTHER): Payer: Self-pay

## 2015-02-05 NOTE — Progress Notes (Signed)
Translator used for this call: Yes, [place].    Patient reached: No. Left message.     I called the patient to reschedule an appointment with pharmacotherapy for hypertension management. Left message with clinic callback number and instructions to make an appointment as soon as possible.

## 2015-02-12 ENCOUNTER — Encounter (HOSPITAL_BASED_OUTPATIENT_CLINIC_OR_DEPARTMENT_OTHER): Payer: Self-pay | Admitting: Internal Medicine

## 2015-02-12 ENCOUNTER — Ambulatory Visit (HOSPITAL_BASED_OUTPATIENT_CLINIC_OR_DEPARTMENT_OTHER): Payer: No Typology Code available for payment source | Admitting: Internal Medicine

## 2015-02-12 VITALS — BP 130/60 | HR 78 | Temp 98.5°F | Ht 64.0 in | Wt 217.0 lb

## 2015-02-12 DIAGNOSIS — E6609 Other obesity due to excess calories: Secondary | ICD-10-CM

## 2015-02-12 DIAGNOSIS — R300 Dysuria: Secondary | ICD-10-CM

## 2015-02-12 DIAGNOSIS — M25369 Other instability, unspecified knee: Secondary | ICD-10-CM

## 2015-02-12 LAB — URINE DIP (POINT OF CARE)
BILIRUBIN, URINE: NEGATIVE
GLUCOSE, URINE: NEGATIVE mg/dl
KETONE, URINE: NEGATIVE mg/dl
NITRITE, URINE: NEGATIVE
PH URINE: 6 (ref 5.0–8.0)
PROTEIN, URINE: NEGATIVE mg/dl (ref 0–15)
SPECIFIC GRAVITY URINE: 1.025 (ref 1.003–1.030)
UROBILINOGEN URINE: 0.2 mg/dl (ref 0.2–1.0)

## 2015-02-12 NOTE — Progress Notes (Signed)
SUBJECTIVE  Mariah [name] is a 59 year old female, [place] speaker, with the following complaints:    1) Dysuria x 1 wk  Initially burning and frequency, but this is gradually improving.  No fever, chills, or flank pain  No unusual vaginal discharge    2) Chronic knee pain  Bilateral knee pain, would like to have an xray   Feels like there is a little lump in the back of the knee, that sometimes swells, and becomes more painful  It makes it difficult to straighten the leg, or to go up or down stairs, or to walk  Sometimes feels that knee gives way, and she's afraid she may fall. This never happened, though  Sometimes knee is very swollen, "like there is an egg behind the knee".    3) Weight  She knows that weight is a great problem for her. She is aware that knee pain and fatty liver are due to obesity, and was discouraged by Dr Marcelene Butte strong push for her to lose at lease 10 lb, "it's not that easy". I had referred her to [facility] clinic, but she was told she needed a referral first.    Most Recent Weight Reading(s)  02/12/15 : 98.431 kg (217 lb)  01/20/15 : 98.521 kg (217 lb 3.2 oz)  01/14/15 : 96.888 kg (213 lb 9.6 oz)  12/05/14 : 94.802 kg (209 lb)  10/15/14 : 94.802 kg (209 lb)    4)  ROS: No fevers or unexplained weight loss. No new headaches. No shortness of breath or chest pain.    5) HM - overdue for physical exam    OtBJECTIVE:  BP 130/60 mmHg   Pulse 78   Temp(Src) 98.5 F (36.9 C) (Oral)   Ht 5\' 4"  (1.626 m)   Wt 98.431 kg (217 lb)   BMI 37.23 kg/m2   SpO2 100%  Pleasant, in NAD    Heart: S1 and S2 normal, no murmurs, clicks, gallops or rubs. Regular rate and rhythm.   Lungs:  clear; no wheezes, rhonchi or rales.  Abd: no suprapubic tenderness  Knees: no swelling or tenderness over patella, mild tenderness on lateral side of the knee, bl, and in the back. Full ROM    ASSESSMENT & PLAN:  (M25.369) Knee instability, unspecified laterality  (primary encounter diagnosis)  Comment: Could be related to  weight, combined with deconditioning, ? Cyst behind the knee. Given her complaint of feeling knee unstable, will refer to ortho to evaluate for mensicus damage. She denies ever sustaining an injury to the knees.  Plan: REFERRAL TO ORTHOPEDICS ( INT)            (E66.09) Obesity due to excess calories, unspecified obesity severity  Comment: An ongoing long problem for this patient, who has tried different diets, and diet pills. She has no regular physical activity program, d/t lack of time. She was referred to [facility], but states that she cannot make an appt there. She's confused re: process  Plan: I called [facility] wt program, and was informed that pt needs a "letter of support" prior to scheduling the first evaluation. I wrote the letter, and it will be faxed today. I was informed that once the office receives the letter, pt will be contacted. I explained her all this, and she was very satisfied and thankful.    (R30.0) Dysuria  Comment: For one week, now improving. Udip shows leuks, but no nitrites. Bc of improvement of sx, I will wait for culture  results prior to treating. Pt encouraged to drink plenty of fluids, including water and cranberry juice.  Plan: URINE CULTURE          The patient was ready to learn and no apparent learning or adherence barriers were identified. I explained the diagnosis and treatment plan, and the patient expressed understanding of the content. I attempted to answer any questions regarding the diagnosis and the proposed treatment.    We discussed the patients current medications. The patient expressed understanding and no barriers to adherence were identified.    follow-up will be scheduled prn. Pt encouraged to schedule a physical exam at her earliest convenience. All screening tests are up to date.  she has been advised to call or return with any worsening or new problems

## 2015-02-14 LAB — URINE CULTURE

## 2015-02-15 ENCOUNTER — Telehealth (HOSPITAL_BASED_OUTPATIENT_CLINIC_OR_DEPARTMENT_OTHER): Payer: Self-pay | Admitting: Internal Medicine

## 2015-02-15 DIAGNOSIS — N390 Urinary tract infection, site not specified: Secondary | ICD-10-CM

## 2015-02-15 MED ORDER — SULFAMETHOXAZOLE-TRIMETHOPRIM 400-80 MG PO TABS
1.00 | ORAL_TABLET | Freq: Two times a day (BID) | ORAL | Status: AC
Start: 2015-02-15 — End: 2015-02-20

## 2015-02-15 NOTE — Progress Notes (Signed)
Unable to reach pt, left voicemail requesting call back.

## 2015-02-15 NOTE — Progress Notes (Signed)
Called pt to discuss pos growth on urine culture. No answer, left message for pt to call back    Pos urine culture  She should be treated with abx  Rx for bactrim is sent to there pharmacy. This is an antibiotic. She should take twice a day, for 5 days. She should complete the full course  Make sure to continue drinking plenty of fluids

## 2015-02-16 NOTE — Telephone Encounter (Signed)
-----   Message from Collene Gobble sent at 02/16/2015  8:58 AM EST -----  Regarding: returning phone call  Contact: [place] [phone], 59 year old, female    Calls today:  Returning phone call/ results  Person calling on behalf of patient: Mother    Rod Can NUMBER: [phone]  Best time to call back:   Cell phone:   Other phone:    Patient's language of care: [place]    Patient needs a [place] interpreter.    Patient's PCP: Lawernce Pitts APRN

## 2015-02-16 NOTE — Progress Notes (Signed)
Pt notified of results. She will go pick up abx today.

## 2015-02-19 ENCOUNTER — Ambulatory Visit (HOSPITAL_BASED_OUTPATIENT_CLINIC_OR_DEPARTMENT_OTHER): Payer: No Typology Code available for payment source | Admitting: Orthopaedic Surgery

## 2015-03-09 ENCOUNTER — Ambulatory Visit (HOSPITAL_BASED_OUTPATIENT_CLINIC_OR_DEPARTMENT_OTHER): Payer: No Typology Code available for payment source | Admitting: Physician Assistant

## 2015-03-09 ENCOUNTER — Other Ambulatory Visit (HOSPITAL_BASED_OUTPATIENT_CLINIC_OR_DEPARTMENT_OTHER): Payer: Self-pay | Admitting: Licensed Practical Nurse

## 2015-03-09 DIAGNOSIS — IMO0002 Reserved for concepts with insufficient information to code with codable children: Secondary | ICD-10-CM

## 2015-03-09 DIAGNOSIS — M25562 Pain in left knee: Secondary | ICD-10-CM

## 2015-03-09 DIAGNOSIS — M1710 Unilateral primary osteoarthritis, unspecified knee: Principal | ICD-10-CM

## 2015-03-09 DIAGNOSIS — M25561 Pain in right knee: Secondary | ICD-10-CM

## 2015-03-09 LAB — XR KNEE LEFT MINIMUM 4 VIEWS

## 2015-03-09 LAB — XR KNEES STANDING AP ONLY BILATERAL

## 2015-03-09 LAB — XR KNEE RIGHT MINIMUM 4 VIEWS

## 2015-03-09 MED ORDER — IBUPROFEN 800 MG PO TABS
800.00 mg | ORAL_TABLET | Freq: Three times a day (TID) | ORAL | Status: AC | PRN
Start: 2015-03-09 — End: 2015-04-08

## 2015-03-09 NOTE — Progress Notes (Signed)
The primary progress note for this visit has been dictated through E-Scription. It can be viewed as an attachment to this encounter or through Chart Review under the Notes Tab as an Orthopedic Office Note.      Review of Systems: Constitutional, Eyes, ENT/Mouth, Cardiovascular, Respiratory, GI, GU, Neuro, Psych, Heme/Lymph, Skin, Musculoskeletal was reviewed and is NEGATIVE except for what is dictated in the note.    MT ACCT #:  [id number]

## 2015-03-09 NOTE — Progress Notes (Signed)
Date of Service: 03/09/2015    HISTORY OF PRESENT ILLNESS: [name] is a 59 year old female who comes to the orthopedic office today on request from primary care physician, for further evaluation of knee pain, left more significant than the right.  This has been present for approximately 8 months now without traumatic event.  She does get pain at night as well as with activity during the day.  She feels as though she has a sense of instability, particularly in her left knee.  She does experience swelling to both knees and more so on the posterior aspect of her left knee.  She has been using ibuprofen with temporary relief.     PAST MEDICAL HISTORY:  Includes: obesity, allergic rhinitis, hypertension, alkaline phos elevation, duodenal ulcer, insomnia, kidney calculus, gastritis, osteoporosis, heart murmur, vitamin D deficiency, subclavian artery stenosis and Schatzki's ring.     MEDICATIONS:  Include: Motrin, Cozaar, Lipitor, Prilosec, vitamin D3, Rogaine, biotin.    ALLERGIES:  To heparin and lisinopril.     SOCIAL HISTORY: Currently lives in [place] and works as a Secretary/administrator.     PHYSICAL EXAMINATION: [name] is alert and oriented to inspection of her bilateral knees. She does not appear to have swelling on palpation.  No effusion. There is a fullness to the posterior aspect of her left knee which is absent on the right.  She does have tenderness medial and lateral joint line, of both knees.  The left knee is more tender.  She does have range of motion of her knees.  Varus and valgus stressing does not exhibit significant laxity. No McMurray's and sensation is present in the lower extremities.    DIAGNOSTIC STUDIES:  X-rays taken of her knees individually as well as standing films does reveal some arthritic changes.     ASSESSMENT AND PLAN: [name] presents to the orthopedic office today for further evaluation of her bilateral knee pain, the left being worse than the right.  She does appear to have some  arthritic change on x-ray.  This may also be causing some fluid collection to the posterior aspect of her left knee. We did briefly discuss cortisone injections.  She does not wish to undergo injections at this time.  Ibuprofen does work for her. I would like her to use it on a more consistent basis and a prescription has been provided. She is also going to attend physical therapy and an order form has been placed.     She will follow up with the orthopedic office in 6 to 8 weeks for further evaluation of her knee pain.   The patient's treatment plan was reviewed with Dr. Bjorn Loser.    ___________________________  Reviewed and Electronically Signed By: Duayne Cal PA-C  Sig Date: 03/19/2015  Sig Time: 17:26:56  Dictated By: Duayne Cal PA-C  Dict Date: 03/09/2015 Dict Time: 05 02 PM    Dictation Date and Time:03/09/2015 17:02:40  Transcription Date and Time:03/09/2015 17:36:32  eScription Dictation id: [id number] [id number] # :[id number]

## 2015-03-09 NOTE — Progress Notes (Signed)
Date of Service: 03/09/2015    ADDENDUM    I personally examined and spoke with Ms. Sousa.  She has bilateral osteoarthritis in her knees.  She did not want to have injections.  She was given antiinflammatory medication and referred to physical therapy.  She will return if she does not get good improvement.    ___________________________  Reviewed and Electronically Signed By: Vertell Limber MD  Sig Date: 03/10/2015  Sig Time: 06:54:16  Dictated By: Vertell Limber MD  Dict Date: 03/09/2015 Dict Time: 02 59 PM    Dictation Date and Time:03/09/2015 14:59:07  Transcription Date and Time:03/09/2015 16:54:15  eScription Dictation id: [id number] Confirmation # :[id number]

## 2015-03-26 ENCOUNTER — Ambulatory Visit (HOSPITAL_BASED_OUTPATIENT_CLINIC_OR_DEPARTMENT_OTHER): Payer: No Typology Code available for payment source

## 2015-04-01 ENCOUNTER — Encounter (HOSPITAL_BASED_OUTPATIENT_CLINIC_OR_DEPARTMENT_OTHER): Payer: Self-pay

## 2015-04-01 ENCOUNTER — Emergency Department (HOSPITAL_BASED_OUTPATIENT_CLINIC_OR_DEPARTMENT_OTHER)
Admission: RE | Admit: 2015-04-01 | Disposition: A | Discharge status: Home / Self Care | Payer: Self-pay | Source: Emergency Department | Attending: Student in an Organized Health Care Education/Training Program | Admitting: Student in an Organized Health Care Education/Training Program

## 2015-04-01 LAB — CBC, PLATELET & DIFFERENTIAL
ABSOLUTE BASO COUNT: 0.1 10*3/uL (ref 0.0–0.1)
ABSOLUTE EOSINOPHIL COUNT: 0.1 10*3/uL (ref 0.0–0.8)
ABSOLUTE IMM GRAN COUNT: 0.01 10*3/uL (ref 0.00–0.03)
ABSOLUTE LYMPH COUNT: 3.1 10*3/uL (ref 0.6–5.9)
ABSOLUTE MONO COUNT: 0.5 10*3/uL (ref 0.2–1.4)
ABSOLUTE NEUTROPHIL COUNT: 4.7 10*3/uL (ref 1.6–8.3)
BASOPHIL %: 0.7 % (ref 0.0–1.2)
EOSINOPHIL %: 1.5 % (ref 0.0–7.0)
HEMATOCRIT: 38.7 % (ref 34.1–44.9)
HEMOGLOBIN: 12.3 g/dL (ref 11.2–15.7)
IMMATURE GRANULOCYTE %: 0.1 % (ref 0.0–0.4)
LYMPHOCYTE %: 36.1 % (ref 15.0–54.0)
MEAN CORP HGB CONC: 31.8 g/dL (ref 31.0–37.0)
MEAN CORPUSCULAR HGB: 25.8 pg — ABNORMAL LOW (ref 26.0–34.0)
MEAN CORPUSCULAR VOL: 81.1 fL (ref 80.0–100.0)
MEAN PLATELET VOLUME: 10.1 fL (ref 8.7–12.5)
MONOCYTE %: 6.2 % (ref 4.0–13.0)
NEUTROPHIL %: 55.4 % (ref 40.0–75.0)
PLATELET COUNT: 300 10*3/uL (ref 150–400)
RBC DISTRIBUTION WIDTH STD DEV: 43.6 fL (ref 35.1–46.3)
RBC DISTRIBUTION WIDTH: 14.8 % — ABNORMAL HIGH (ref 11.5–14.3)
RED BLOOD CELL COUNT: 4.77 M/uL (ref 3.90–5.20)
WHITE BLOOD CELL COUNT: 8.5 10*3/uL (ref 4.0–11.0)

## 2015-04-01 LAB — BASIC METABOLIC PANEL
ANION GAP: 9 mmol/L (ref 5–15)
BUN (UREA NITROGEN): 18 mg/dL (ref 7–18)
CALCIUM: 9.6 mg/dL (ref 8.5–10.1)
CARBON DIOXIDE: 29 mmol/L (ref 21–32)
CHLORIDE: 100 mmol/L (ref 98–107)
CREATININE: 0.8 mg/dL (ref 0.4–1.2)
ESTIMATED GLOMERULAR FILT RATE: >60 mL/min (ref >60)
Glucose Random: 95 mg/dL (ref 74–160)
POTASSIUM: 3.6 mmol/L (ref 3.5–5.1)
SODIUM: 138 mmol/L (ref 136–145)

## 2015-04-01 MED ORDER — HYDROCHLOROTHIAZIDE 25 MG PO TABS
25.0000 mg | ORAL_TABLET | Freq: Every day | ORAL | Status: DC
Start: 2015-04-01 — End: 2015-04-01

## 2015-04-01 MED ORDER — HYDROCHLOROTHIAZIDE 25 MG PO TABS
25.0000 mg | ORAL_TABLET | Freq: Every day | ORAL | Status: DC
Start: 2015-04-01 — End: 2015-04-13

## 2015-04-01 MED ORDER — ONDANSETRON HCL 4 MG/2ML IJ SOLN
4.0000 mg | Freq: Once | INTRAMUSCULAR | Status: AC
Start: 2015-04-01 — End: 2015-04-01
  Administered 2015-04-01: 4 mg via INTRAVENOUS
  Filled 2015-04-01: qty 2

## 2015-04-01 MED ORDER — DILTIAZEM HCL 25 MG/5ML IV SOLN
INTRAVENOUS | Status: DC
Start: 2015-04-01 — End: 2015-04-02
  Filled 2015-04-01: qty 5

## 2015-04-01 MED ORDER — HYDROCHLOROTHIAZIDE 25 MG PO TABS
25.00 mg | ORAL_TABLET | Freq: Once | ORAL | Status: AC
Start: 2015-04-01 — End: 2015-04-01
  Administered 2015-04-01: 25 mg via ORAL
  Filled 2015-04-01: qty 1

## 2015-04-01 MED ORDER — DILTIAZEM HCL 25 MG/5ML IV SOLN
10.00 mg | Freq: Once | INTRAVENOUS | Status: AC
Start: 2015-04-01 — End: 2015-04-01
  Administered 2015-04-01: 10 mg via INTRAVENOUS

## 2015-04-01 NOTE — Discharge Instructions (Signed)
If you develop worsening symptoms, severe headache, persistent vomiting, chest pain or difficulty breathing or any other concerning symptoms then return to the emergency department immediately    Take all medications as previously prescribed, in addition take hydrochlorothiazide for blood pressure reduction, follow with your PCP tomorrow regarding this new medication and blood pressure monitoring

## 2015-04-01 NOTE — ED Triage Note (Signed)
Pt ambulated to ED with daughter.   Complains of dizziness like the room is spinning, numbness in her tongue, and nausea that began about 20 minutes ago. She believes it is in relation to high blood pressure.   BP: 213/92. Daughter states that her mother has been experiencing high blood pressure despite regularly taking her BP medication for some time.   No vomiting. Denies visual changes.

## 2015-04-01 NOTE — ED Provider Notes (Signed)
EMERGENCY DEPARTMENT  NOTE        The patient was seen primarily by me. The Emergency Department nursing record was reviewed. Prior records as available through the electronic health record were reviewed.    Chief Complaint: VERIFY CHIEF COMPLAINT      History of Present Illness:    This 59 year old female patient presents with high blood pressure.  Patient reports 30 minutes prior to arrival she started to feel unwell, reports feeling nauseous with dizziness and reports that her tongue felt numb.  Dizziness described as lightheadedness, comes and goes.  Patient reports history of high blood pressure, reports compliance with her blood pressure medication, has not had any recent   Denies any headache, extremity weakness/numbness, vision changes, chest pain, shortness of breath    Review of Systems: Pertinent positives were reviewed as per the HPI above. All other systems were reviewed and are negative.      Past Medical History/Problem list:    Past Medical History    Screening for hypertension 08/05/2004    Comment: occ elevated BP started on anti-HTN in [place]; chem 7, EKG, HCT u/a wnl 4/03; d/c'ed HCTZ 8/05 when we learned from daughter she has not been taking it    ANXIETY DEPRESSION 08/05/2004    Comment: TSH wnl 8/02; trial of fluoxetine 8/05    PERS HX TOBACCO USE 08/05/2004    Comment: quit 10/02; 1/2 PPD since 59 YO    BACK PAIN LOW 08/05/2004    Comment: LBP musculoskeletal most likely 1/04    OBESITY NOS 08/05/2004    Comment: has tried meds from [place]    PERS HX OF PAST NONCOMPLIANCE 08/05/2004    Comment: poor compliance to med regimen    Hyperparathyroidism 10/22/2006    Comment: osteoporosis found - pt referred to surgery for the hyperparathyroidism- s/p parathyroidectomy feb 2008    Vitamin D deficiency 05/19/2008    Comment: Supplement per dr Jake Samples.    Diarrhea 03/27/2006    Comment: 7/07 The colonoscopy was visually normal and biopsies taken to rule out microscopic colitis were negative.  A biopsy  of the descending duodenum to rule out celiac sprue because of the diarrhea was negative.      ABN FIND-STOOL CONTENTS-OCC BLOOD 03/27/2006    Comment: s/p colonoscopy and EGD 7/07    Cough 04/23/2006    Comment: s/p PFTs September 2007 --  some obstruction at mid to low lung volumes not correctable by bronchodilator CT scan showed stable pulmonary nodules over several years 11/07 - cough gone ** note combined with dx of "nodules on CT scan". Will file this note to hx.       ABN FIND-STOOL CONTENTS-OCC BLOOD 03/27/2006    Comment: s/p colonoscopy and EGD 7/07    Cough 04/23/2006    Comment: s/p PFTs September 2007 --  some obstruction at mid to low lung volumes not correctable by bronchodilator CT scan showed stable pulmonary nodules over several years 11/07 - cough gone ** note combined with dx of "nodules on CT scan". Will file this note to hx.       Diarrhea 03/27/2006    Comment: 7/07 The colonoscopy was visually normal and biopsies taken to rule out microscopic colitis were negative.  A biopsy of the descending duodenum to rule out celiac sprue because of the diarrhea was negative.      Gastritis 12/19/2012    Comment: Combined with entry "esophageal reflux      depressive disorder 08/05/2004  Comment: TSH wnl 8/02; trial of fluoxetine x 6 wks didn't really help 8/05  trial of citalopram per Dr. Patrice Paradise. later resolved. 04/15/2014 - Reassessed, pt reports feeling well, with no sx of depression. Just reports being tired, which she attributes to current life style. Will resolve from problem list.      Inconclusive mammogram 06/18/2012    Comment: June 2013 - BIRADS 3. Probable benign mammogram. Recommend 6 month  follow-up mammogram (Dec 2013).  Dec/13 - Stable left breast nodule with no evidence of malignancy. BIRADS 2 benign recommend annual screening mammogram in 6 months  (June 2014) June/14 - mammogram request given to pt, she will call to schedule:  Jan/15 - normal mammogram, yearly screening. Resolve from problem list.           nodules on chest CT 1/06 and 3/07 03/27/2006    Comment: s/p PFTs September 2007 --  some obstruction at mid to low lung volumes not correctable by bronchodilator CT scan showed stable pulmonary nodules over several years 11/07 - cough gone  04/29/12 - saw pneumologist, Dr Jordan Hawks, "Will repeat chest CT with high-resolution scans at the 2 month mark (already ordered). If abnormal, would do PFT's and further rheum w/u, consider lung bx . Pt to call if any r    ABSENCE OF MENSTRUATION - s/p TAH BSO 06/04/2006     Patient Active Problem List:     Obesity, unspecified     LATERAL FOOT PAIN     Benign hypertensive heart disease without heart failure     Alkaline phosphatase elevation     Insomnia, unspecified     Calculus of kidney     Gastritis     Allergic rhinitis due to other allergen     Osteoporosis     Murmur heart     Family History of Throat Cancer     Vitamin D deficiency     Subclavian artery stenosis, left     Schatzki's ring     Duodenal ulcer, unspecified as acute or chronic, without hemorrhage, perforation, or obstruction        Past Surgical History:       Past Surgical History    TOTAL ABDOMINAL HYSTERECT W/WO RMVL TUBE OVARY  5/00    Comment Hysterectomy, Total Abdominal secondary to fibroids    SALPINGO-OOPHORECTOMY COMPL/PRTL UNI/BI SPX  5/00    Comment Salpingo-Oophorectomy, bilateral    PARATHYROIDECTOMY  2006    Comment partial,     TONSILLECTOMY ONE-HALF            Medications:     No current facility-administered medications for this encounter.  Current Outpatient Prescriptions:  ibuprofen (ADVIL,MOTRIN) 800 MG tablet Take 1 tablet by mouth every 8 (eight) hours as needed for Pain. Disp: 90 tablet Rfl: 0   losartan (COZAAR) 50 MG tablet Take 2 tablets po in the morning Disp: 60 tablet Rfl: 5   atorvastatin (LIPITOR) 20 MG tablet Take 1 tablet by mouth daily. Disp: 30 tablet Rfl: 2   omeprazole (PRILOSEC) 20 MG capsule Take 1 capsule by mouth 2 (two) times daily. Disp: 60 capsule Rfl: 11    Vitamin D, Ergocalciferol, 50000 UNITS capsule Take 1 capsule by mouth once a week. Disp: 18 capsule Rfl: 0   Minoxidil (ROGAINE WOMENS) 5 % FOAM Apply 0.5 Units Topically daily. Apply directly to hair thinning areas, and massage. Disp: 60 g Rfl: 3   cholecalciferol (VITAMIN D3) 1000 UNIT tablet Take 1 tablet by mouth daily. Disp:  30 tablet Rfl: 11   Biotin 1000 MCG TABS Tablet Take 1,000 mcg by mouth daily. Disp:  Rfl:        Immunizations:    Social History:     Social History   Marital Status: Legally Separated  Spouse Name: N/A    Years of Education: N/A  Number of Children: N/A     Occupational History  None on file     Social History Main Topics   Smoking status: Former Smoker  0.50 Packs/Day  For 40.00 Years     Types: Cigarettes    Quit date: 03/31/2012    Smokeless tobacco: Former Systems developer    Comment: 3-4 cigarretes a day. Quit about 1 month ago    Alcohol Use: Yes    Comment: very occasional    Drug Use: No    Sexual Activity: Yes    Partners: Male    Comment: histerectomy     Other Topics Concern   None on file     Social History Narrative    work: Engineer, building services; owned bakery in [place]    Lives w/ daughter 24 YO (Grazielle [place]) & son-in-law Lowella Dandy)    Divorced; currently not in a relationship         Allergies:  Review of Patient's Allergies indicates:   Heparin                     Comment:local rash seen during hospitalization Feb 2008   Lisinopril              Cough      Physical Exam:  GENERAL:  59 year old female in no apparent distress.    VITAL SIGNS:  BP 195/98 mmHg   Pulse 70   Resp 16   SpO2 100%    HEENT:  Normocephalic, atraumatic.  PERRL.  Extraocular muscles intact.  Neck is supple.  CHEST:  Lungs are clear to auscultation bilaterally.  No wheeze or crackles.   CARDIAC:  RRR, S1/S2 present, no m/r/g     BACK:  No midline ttp, no flank ttp  ABDOMEN:  Soft, nontender, non distended, no obvious organomegaly. BS present  EXTREMITIES:  Warm and well perfused. Pulses are 2+ bilateral.  No  edema.    SKIN:  Warm and dry.  No rash.    NEUROLOGIC:  CN 2-12 intact.   Sensation intact.  Normal Strength in all extremities.  No dysmetria       ED Course and Medical Decision Making: 59 year old female presents with high blood pressure, dizziness, nausea and vague numbness to her tongue, does not have any obvious neurologic deficits on examination.  Differential diagnosis includes hypertensive urgency, intracranial hemorrhage, stroke, vertigo, anxiety.  Patient's symptoms seem most consistent with hypertensive urgency, she was given 10 milligrams of IV Cardizem and her blood pressure came down nicely.  EKG obtained shows normal sinus rhythm and rate, no ST segment elevation or depressions, no T-wave inversions, normal axis.  CT scan of the head unremarkable.  Labwork unremarkable, on reassessment patient reports feeling much better, has had complete resolution of symptoms.  Patient offered admission for blood pressure control, she would rather be discharged home.  Patient was started on hydrochlorothiazide, she'll follow with her PCP tomorrow to discuss blood pressure medications and rechecking her blood pressure.  Given return precautions    ED Visit History:  Prior ED visits      Pulse Oximetry Interpretation: 100%, normal     Rhythm Strip  Interpretation: NSR    Orders:     Orders Placed This Encounter  CT Head w/o Contrast  Standing Status: Standing  Number of Occurrences: 1  Order Specific Question: Critical Signs and Symptoms  Answer: hypertension, dizziness, nausea  Order Specific Question: To assure imaging is appropriate for clinical symptoms or due to patient restrictions, this order may be canceled in Epic (edited in [place]) to a more specific order.  Answer: Yes  CBC+Plt with Diff  Standing Status: Standing  Number of Occurrences: 1  Basic Metabolic Panel  Standing Status: Standing  Number of Occurrences: 1  EKG : Initial  Standing Status: Standing  Number of Occurrences: 1  Order Specific Question: EKG  Status:  Answer: Initial  Order Specific Question: Indication for Exam (EKG):  Answer: Dizziness-R42  ondansetron (ZOFRAN) injection 4 mg   Sig:   diltiazem (CARDIZEM) bolus injection 10 mg   Sig:   diltiazem (CARDIZEM) 25 MG/5ML bolus injection   Sig:    DHADWAR, GURJEET: cabinet override  hydrochlorothiazide (HYDRODIURIL) tablet 25 mg   Sig:   hydrochlorothiazide (HYDRODIURIL) 25 MG tablet   Sig: Take 1 tablet by mouth daily.   Dispense:  14 tablet   Refill:  0        Results:  Results for orders placed or performed during the hospital encounter of 04/01/15 (from the past 24 hour(s))   CBC+Plt with Diff    Collection Time: 04/01/15  8:44 PM   Result Value    WHITE BLOOD CELL COUNT 8.5    RED BLOOD CELL COUNT 4.77    HEMOGLOBIN 12.3    HEMATOCRIT 38.7    MEAN CORPUSCULAR VOL 81.1    MEAN CORPUSCULAR HGB 25.8 (L)    MEAN CORP HGB CONC 31.8    RBC DISTRIBUTION WIDTH STD DEV 43.6    RBC DISTRIBUTION WIDTH 14.8 (H)    PLATELET COUNT 300    MEAN PLATELET VOLUME 10.1    NEUTROPHIL % 55.4    IMMATURE GRANULOCYTE % 0.1    LYMPHOCYTE % 36.1    MONOCYTE % 6.2    EOSINOPHIL % 1.5    BASOPHIL % 0.7    ABSOLUTE NEUTROPHIL COUNT 4.7    ABSOLUTE IMM GRAN COUNT 0.01    ABSOLUTE LYMPH COUNT 3.1    ABSOLUTE MONO COUNT 0.5    ABSOLUTE EOSINOPHIL COUNT 0.1    ABSOLUTE BASO COUNT 0.1   Basic Metabolic Panel    Collection Time: 04/01/15  8:44 PM   Result Value    SODIUM 138    POTASSIUM 3.6    CHLORIDE 100    CARBON DIOXIDE 29    ANION GAP 9    CALCIUM 9.6    Glucose Random 95    BUN (UREA NITROGEN) 18    CREATININE 0.8    ESTIMATED GLOMERULAR FILT RATE > 60             Critical Care Time: 30 minutes          Patient was discharged from the Emergency Department in stable and improved condition.  The patient was given discharge instructions from [facility] and instructed to return to the Emergency Department for any new or worsening symptoms; otherwise, to follow up with the patients PCP.  Patient voiced understanding of the discharge  instructions and agreed with the Emergency Department plan and disposition.  Patients questions were asked and answered prior to discharge.       PROVISIONARY DIAGNOSIS:  Hypertensive urgency  (primary  encounter diagnosis)              Konrad Saha, MD, 04/01/2015, 8:29 PM          Dictation software was used for this chart, please excuse any dictation errors.

## 2015-04-01 NOTE — Narrator Note (Signed)
Patient Disposition    Patient education for diagnosis, medications, activity, diet and follow-up.  Patient left ED 10:33 PM.  Patient rep received written instructions.  Interpreter to provide instructions: Yes    Patient belongings with patient: YES    Have all existing LDAs been addressed? N/A    Have all IV infusions been stopped? Yes    Discharged to: Discharged to home. Pt ambulated from ED with daughter.

## 2015-04-02 ENCOUNTER — Telehealth (HOSPITAL_BASED_OUTPATIENT_CLINIC_OR_DEPARTMENT_OTHER): Payer: Self-pay

## 2015-04-02 LAB — CT HEAD WO CONTRAST

## 2015-04-02 LAB — EKG

## 2015-04-02 NOTE — Progress Notes (Signed)
Returned call to pt for ER f/u. She was seen in ER 4/14 for hypertension. She said she is feeling better today, just has a little dizziness at times. Denies headache, chest pain, SOB, numbness/tingling/weakness on one side of face or body. The ER physician prescribed her hctz to take in addition to the losartan she takes. She has not picked up that med yet, but plans on going to the pharmacy today after work. She is checking her bp at home and will continue to do that, I asked her to write them down in a log and bring that with her to next appt. Appt scheduled for f/u with pcp on 4/19, also reminded pt she has appt 4/26 with the pharmacist for mgmt of hypertension medication.

## 2015-04-02 NOTE — Telephone Encounter (Signed)
-----   Message from [facility] sent at 04/02/2015  9:38 AM EDT -----  Regarding: clinical  Contact: [phone]  Mariah [name] [id number], 59 year old, female    Calls today asking to speak to pcp as she was seen at the ER yesterday.  Person calling on behalf of patient: Patient (self)    [place]  Best time to call back:   Cell phone:   Other phone:    Patient's language of care: [place]    Patient needs a [place] interpreter.    Patient's PCP: Lawernce Pitts APRN

## 2015-04-06 ENCOUNTER — Ambulatory Visit (HOSPITAL_BASED_OUTPATIENT_CLINIC_OR_DEPARTMENT_OTHER): Payer: Self-pay | Admitting: Internal Medicine

## 2015-04-06 ENCOUNTER — Ambulatory Visit (HOSPITAL_BASED_OUTPATIENT_CLINIC_OR_DEPARTMENT_OTHER): Payer: Self-pay | Admitting: Rehabilitative and Restorative Service Providers"

## 2015-04-13 ENCOUNTER — Ambulatory Visit (HOSPITAL_BASED_OUTPATIENT_CLINIC_OR_DEPARTMENT_OTHER): Payer: No Typology Code available for payment source

## 2015-04-13 ENCOUNTER — Ambulatory Visit (HOSPITAL_BASED_OUTPATIENT_CLINIC_OR_DEPARTMENT_OTHER): Payer: Self-pay | Admitting: Allergy

## 2015-04-13 VITALS — BP 138/86

## 2015-04-13 DIAGNOSIS — I119 Hypertensive heart disease without heart failure: Secondary | ICD-10-CM

## 2015-04-13 MED ORDER — LOSARTAN POTASSIUM 100 MG PO TABS
100.0000 mg | ORAL_TABLET | Freq: Every day | ORAL | Status: DC
Start: 2015-04-13 — End: 2016-05-18

## 2015-04-13 MED ORDER — HYDROCHLOROTHIAZIDE 25 MG PO TABS
25.0000 mg | ORAL_TABLET | Freq: Every day | ORAL | Status: DC
Start: 2015-04-13 — End: 2016-04-12

## 2015-04-13 NOTE — Progress Notes (Signed)
PHARMACOTHERAPY CONSULT    Translator used for this visit: Yes - 1610    Mariah [name] is a 59 year old female who presents here today for hypertension teaching and medication review. Was seen in the ED on 04/01/15 for hypertensive urgency. She felt dizzy and nauseous and checked her BP at home it was 170/100, rechecked and SBP 190 and symptoms worsened, rechecked a third time and SBP was in the 200s. She received diltizem in the ED and was discharged with a 14-day supply of HCTZ 25mg  daily. She also takes losartan 100mg  daily. Since then, she reports BP have been improved. She denies complaints at this time.     Took medications today: Not yet, takes at night; took them as usual last night  Home blood pressure readings: 130-140/90-100 since ED visit two weeks ago  Diet: Does not eat canned goods or processed meats, drinks coffee 2 large cups per day,  SH: smokes 1 cigarette nightly, no EtOH    OBJECTIVE:  Most Recent BP Reading(s)  04/01/15 : 164/74  02/12/15 : 130/60  01/20/15 : 130/80  01/14/15 : 156/76  12/05/14 : 158/82      Current Outpatient Prescriptions on File Prior to Visit:  hydrochlorothiazide (HYDRODIURIL) 25 MG tablet Take 1 tablet by mouth daily. Disp: 14 tablet Rfl: 0   losartan (COZAAR) 50 MG tablet Take 2 tablets po in the morning Disp: 60 tablet Rfl: 5   atorvastatin (LIPITOR) 20 MG tablet Take 1 tablet by mouth daily. Disp: 30 tablet Rfl: 2   omeprazole (PRILOSEC) 20 MG capsule Take 1 capsule by mouth 2 (two) times daily. Disp: 60 capsule Rfl: 11   Vitamin D, Ergocalciferol, 50000 UNITS capsule Take 1 capsule by mouth once a week. Disp: 18 capsule Rfl: 0   Minoxidil (ROGAINE WOMENS) 5 % FOAM Apply 0.5 Units Topically daily. Apply directly to hair thinning areas, and massage. Disp: 60 g Rfl: 3   cholecalciferol (VITAMIN D3) 1000 UNIT tablet Take 1 tablet by mouth daily. Disp: 30 tablet Rfl: 11   Biotin 1000 MCG TABS Tablet Take 1,000 mcg by mouth daily. Disp:  Rfl:         ASSESSMENT:  BP: Controlled goal less than 150/90 per JNC 8 (no compelling indications, age < 57)    Recommendations:  1. Medication: continue losartan 100mg  daily and HCTZ 25mg  daily.   2. BMP tomorrow (patient did not have time today)  3. Regular exercise, lifestyle modifications, DASH diet    Education:  What can you do to lower your blood pressure: Regular exercise  Cut down on salt  Take your medication regularly  Check BP regularly    Planned return date: 5/24 at 3:15pm    Provided verbal and written dosing instructions. Pt verbalized understanding of everything discussed and agrees with plan. Pt was given the opportunity to ask questions/voice concerns.    Encouraged patient to keep scheduled pharmacotherapy visits and to call site pharmacist with questions.

## 2015-04-15 ENCOUNTER — Ambulatory Visit (HOSPITAL_BASED_OUTPATIENT_CLINIC_OR_DEPARTMENT_OTHER): Payer: No Typology Code available for payment source | Admitting: Internal Medicine

## 2015-04-22 ENCOUNTER — Ambulatory Visit (HOSPITAL_BASED_OUTPATIENT_CLINIC_OR_DEPARTMENT_OTHER): Payer: No Typology Code available for payment source | Admitting: Internal Medicine

## 2015-05-11 ENCOUNTER — Ambulatory Visit (HOSPITAL_BASED_OUTPATIENT_CLINIC_OR_DEPARTMENT_OTHER): Payer: No Typology Code available for payment source

## 2015-05-12 ENCOUNTER — Ambulatory Visit (HOSPITAL_BASED_OUTPATIENT_CLINIC_OR_DEPARTMENT_OTHER): Payer: Self-pay | Admitting: Orthopaedic Surgery

## 2015-05-20 ENCOUNTER — Telehealth (HOSPITAL_BASED_OUTPATIENT_CLINIC_OR_DEPARTMENT_OTHER): Payer: Self-pay

## 2015-05-20 NOTE — Progress Notes (Signed)
Translator used for this call: Yes, [place].    Patient reached: No. Left message.     I called the patient to reschedule an appointment with pharmacotherapy for blood pressure check. Left message with clinic callback number and instructions to make an appointment as soon as possible.

## 2015-05-26 ENCOUNTER — Ambulatory Visit (HOSPITAL_BASED_OUTPATIENT_CLINIC_OR_DEPARTMENT_OTHER): Payer: No Typology Code available for payment source | Admitting: Gastroenterology

## 2015-05-27 ENCOUNTER — Encounter (HOSPITAL_BASED_OUTPATIENT_CLINIC_OR_DEPARTMENT_OTHER): Payer: Self-pay | Admitting: Internal Medicine

## 2015-05-27 ENCOUNTER — Ambulatory Visit (HOSPITAL_BASED_OUTPATIENT_CLINIC_OR_DEPARTMENT_OTHER): Payer: No Typology Code available for payment source | Admitting: Internal Medicine

## 2015-05-27 VITALS — BP 140/69 | HR 70 | Ht 64.0 in | Wt 215.0 lb

## 2015-05-27 DIAGNOSIS — G4700 Insomnia, unspecified: Secondary | ICD-10-CM

## 2015-05-27 DIAGNOSIS — Z0000 Encounter for general adult medical examination without abnormal findings: Secondary | ICD-10-CM

## 2015-05-27 DIAGNOSIS — R300 Dysuria: Secondary | ICD-10-CM

## 2015-05-27 DIAGNOSIS — I119 Hypertensive heart disease without heart failure: Secondary | ICD-10-CM

## 2015-05-27 DIAGNOSIS — E785 Hyperlipidemia, unspecified: Secondary | ICD-10-CM

## 2015-05-27 DIAGNOSIS — E669 Obesity, unspecified: Secondary | ICD-10-CM

## 2015-05-27 LAB — URINE DIP (POINT OF CARE)
BILIRUBIN, URINE: NEGATIVE
GLUCOSE, URINE: NEGATIVE mg/dl
KETONE, URINE: NEGATIVE mg/dl
LEUKOCYTE ESTERASE: NEGATIVE
NITRITE, URINE: NEGATIVE
PH URINE: 5.5 (ref 5.0–8.0)
PROTEIN, URINE: NEGATIVE mg/dl (ref 0–15)
SPECIFIC GRAVITY URINE: 1.025 (ref 1.003–1.030)
UROBILINOGEN URINE: 0.2 mg/dl (ref 0.2–1.0)

## 2015-05-27 MED ORDER — TRAZODONE HCL 50 MG PO TABS
100.0000 mg | ORAL_TABLET | Freq: Every evening | ORAL | 5 refills | Status: AC
Start: 2015-05-27 — End: 2015-11-23

## 2015-05-27 MED ORDER — ATORVASTATIN CALCIUM 20 MG PO TABS
20.0000 mg | ORAL_TABLET | Freq: Every day | ORAL | 2 refills | Status: AC
Start: 2015-05-27 — End: 2015-08-25

## 2015-05-27 NOTE — Progress Notes (Signed)
SUBJECTIVE  Mariah [name] is a 59 year old female, [place] speaker, well known to this provider, for FU. She's due for a routine physical exam, and we will do today.     FU on chronic conditions:    #1) HTN  Pt reports taking medications daily. Denies chest pain, SOB, DOE, palpitations, dizziness, head aches, leg edema.   Most Recent BP Reading(s)  05/27/15 : 140/69  04/13/15 : 138/86  04/01/15 : 164/74  02/12/15 : 130/60  01/20/15 : 130/80    #2) Body aches  Much improved now that the cold weather is behind [place]. "It seems that cold weather makes everything worse"  Has more energy  Started taking a MVT, thinks that it helps with her energy level  Traveled to [facility] one weekend, and it was very relaxing. Then, another weekend, went in a boat.   Finds that these breaks helped her feel better. Wonders why she doesn't do it more often. Works very hard, there's never time for a break.     #3)  Decreased hearing right ear  No pain, no drainage. Subjective. Did not notice the need to increase the volume of the TV.    #4) Obesity  Could not schedule appt at [facility], she said that they never received the letter that we faxed. She called. They need a referral, and a letter.    #5) Insomnia -   Requests refill for trazodone. Does not have insomnia every day, only on days that she works more, up to 10-12h/day.   Trazodone was helpful, when she took it.    Review of Patient's Allergies indicates:   Heparin                     Comment:local rash seen during hospitalization Feb 2008   Lisinopril              Cough    Current Outpatient Prescriptions on File Prior to Visit:  hydrochlorothiazide (HYDRODIURIL) 25 MG tablet Take 1 tablet by mouth daily. Disp: 30 tablet Rfl: 11   losartan (COZAAR) 100 MG tablet Take 1 tablet by mouth daily. Disp: 30 tablet Rfl: 11   omeprazole (PRILOSEC) 20 MG capsule Take 1 capsule by mouth 2 (two) times daily. Disp: 60 capsule Rfl: 11   cholecalciferol (VITAMIN D3) 1000 UNIT tablet Take 1 tablet  by mouth daily. Disp: 30 tablet Rfl: 11   [DISCONTINUED] atorvastatin (LIPITOR) 20 MG tablet Take 1 tablet by mouth daily. Disp: 30 tablet Rfl: 2   [DISCONTINUED] traZODone (DESYREL) 50 MG tablet Take 2 tablets by mouth nightly. Disp: 30 tablet Rfl: 5   Biotin 1000 MCG TABS Tablet Take 1,000 mcg by mouth daily. Disp:  Rfl:      No current facility-administered medications on file prior to visit.   Patient Active Problem List:     Obesity, unspecified     LATERAL FOOT PAIN     Benign hypertensive heart disease without heart failure     Alkaline phosphatase elevation     Insomnia, unspecified     Calculus of kidney     Gastritis     Allergic rhinitis due to other allergen     Osteoporosis     Murmur heart     Family history of throat cancer     Vitamin D deficiency     Subclavian artery stenosis, left     Schatzki's ring     Duodenal ulcer, unspecified as acute or chronic, without  hemorrhage, perforation, or obstruction      Past Medical History    ABN FIND-STOOL CONTENTS-OCC BLOOD 03/27/2006    Comment: s/p colonoscopy and EGD 7/07    ABN FIND-STOOL CONTENTS-OCC BLOOD 03/27/2006    Comment: s/p colonoscopy and EGD 7/07    ABSENCE OF MENSTRUATION - s/p TAH BSO 06/04/2006    ANXIETY DEPRESSION 08/05/2004    Comment: TSH wnl 8/02; trial of fluoxetine 8/05    BACK PAIN LOW 08/05/2004    Comment: LBP musculoskeletal most likely 1/04    Cough 04/23/2006    Comment: s/p PFTs September 2007 --  some obstruction at mid to low lung volumes not correctable by bronchodilator CT scan showed stable pulmonary nodules over several years 11/07 - cough gone ** note combined with dx of "nodules on CT scan". Will file this note to hx.       Cough 04/23/2006    Comment: s/p PFTs September 2007 --  some obstruction at mid to low lung volumes not correctable by bronchodilator CT scan showed stable pulmonary nodules over several years 11/07 - cough gone ** note combined with dx of "nodules on CT scan". Will file this note to hx.       depressive  disorder 08/05/2004    Comment: TSH wnl 8/02; trial of fluoxetine x 6 wks didn't really help 8/05  trial of citalopram per Dr. Patrice Paradise. later resolved. 04/15/2014 - Reassessed, pt reports feeling well, with no sx of depression. Just reports being tired, which she attributes to current life style. Will resolve from problem list.      Diarrhea 03/27/2006    Comment: 7/07 The colonoscopy was visually normal and biopsies taken to rule out microscopic colitis were negative.  A biopsy of the descending duodenum to rule out celiac sprue because of the diarrhea was negative.      Diarrhea 03/27/2006    Comment: 7/07 The colonoscopy was visually normal and biopsies taken to rule out microscopic colitis were negative.  A biopsy of the descending duodenum to rule out celiac sprue because of the diarrhea was negative.      Gastritis 12/19/2012    Comment: Combined with entry "esophageal reflux      Hyperparathyroidism 10/22/2006    Comment: osteoporosis found - pt referred to surgery for the hyperparathyroidism- s/p parathyroidectomy feb 2008    Inconclusive mammogram 06/18/2012    Comment: June 2013 - BIRADS 3. Probable benign mammogram. Recommend 6 month  follow-up mammogram (Dec 2013).  Dec/13 - Stable left breast nodule with no evidence of malignancy. BIRADS 2 benign recommend annual screening mammogram in 6 months  (June 2014) June/14 - mammogram request given to pt, she will call to schedule:  Jan/15 - normal mammogram, yearly screening. Resolve from problem list.          nodules on chest CT 1/06 and 3/07 03/27/2006    Comment: s/p PFTs September 2007 --  some obstruction at mid to low lung volumes not correctable by bronchodilator CT scan showed stable pulmonary nodules over several years 11/07 - cough gone  04/29/12 - saw pneumologist, Dr Jordan Hawks, "Will repeat chest CT with high-resolution scans at the 2 month mark (already ordered). If abnormal, would do PFT's and further rheum w/u, consider lung bx . Pt to call if any r    OBESITY  NOS 08/05/2004    Comment: has tried meds from [place]    PERS HX OF PAST NONCOMPLIANCE 08/05/2004    Comment: poor compliance to med regimen  PERS HX TOBACCO USE 08/05/2004    Comment: quit 10/02; 1/2 PPD since 59 YO    Screening for hypertension 08/05/2004    Comment: occ elevated BP started on anti-HTN in [place]; chem 7, EKG, HCT u/a wnl 4/03; d/c'ed HCTZ 8/05 when we learned from daughter she has not been taking it    Vitamin D deficiency 05/19/2008    Comment: Supplement per dr Jake Samples.       Past Surgical History    TOTAL ABDOMINAL HYSTERECT W/WO RMVL TUBE OVARY  5/00    Comment Hysterectomy, Total Abdominal secondary to fibroids    SALPINGO-OOPHORECTOMY COMPL/PRTL UNI/BI SPX  5/00    Comment Salpingo-Oophorectomy, bilateral    PARATHYROIDECTOMY  2006    Comment partial,     TONSILLECTOMY ONE-HALF          Family History    Cancer - Lung Father     Comment: died from lung CA- throat cancer per pt. Also on dyalisis for 10 years    Hypertension Mother     Lipids Mother     Comment: hyperlipidemia    Heart Maternal Uncle     Comment: died 101 from MI    Diabetes Maternal Aunt     Diabetes Mother     Comment: also morbidly weight    Cancer - Other Brother     Comment: liver cancer, Sept 2013    Non-contributory Brother     Comment: no first deg rel. w/ breast ca       Social History   Marital status: Legally Separated  Spouse name: N/A    Years of education: N/A  Number of children: N/A     Occupational History  None on file     Social History Main Topics   Smoking status: Former Smoker  0.50 Packs/day  For 40.00 Years     Types: Cigarettes    Quit date: 03/31/2012    Smokeless tobacco: Former Systems developer    Comment: 3-4 cigarretes a day. Quit about 1 month ago    Alcohol use Yes    Comment: very occasional    Drug use: No    Sexual activity: Yes    Partners: Male    Comment: histerectomy     Other Topics Concern   None on file     Social History Narrative    work: Engineer, building services; owned bakery in [place]    Lives w/ daughter  40 YO (Grazielle Cullom) & son-in-law Lowella Dandy)    Divorced; currently not in a relationship        05/27/2015    From [place]. Works as Electrical engineer, has own business.     Married, Djaniro.     No regular exercise    Diet: varies. Eats at irregular intervals. Tries to watch diet, but likes to eat    Stressors: work    Pleasures: family, grand children     Review of symptoms:   No fevers or unexplained weight loss. No visual changes. No sore throat or ear ache.  No prolonged cough. No dyspnea or chest pain on exertion.  No abdominal pain or change in bowel habits.  No vaginal discharge, mild dysuria, after urination. No frequency or urgency. No new or unusual musculoskeletal symptoms. No new rashes or skin changes.  No pain or masses in breasts. No paresthesias or unusual headaches. No sadness or anxiety that interferes with day-to-day activities. No hot flashes. No enlarged nodes.  No new itching, sneezing or wheezing.  OBJECTIVE:  BP 140/69  Pulse 70  Ht 5\' 4"  (1.626 m)  Wt 97.5 kg (215 lb)  SpO2 100%  BMI 36.9 kg/m2  General Appearance: The patient appears well, in NAD. Alert, well, developed, well nourished.    HEENT: Head: Normocephalic, atraumatic.   Eyes:  PERRLA, EOMs intact. Conjunctiva, cornea clear.No discharge.   Ears: No pain with movement of tragus. Ear canals normal. TMs clear bilaterally.   Nose/Sinuses: Patent, scant amount of clear mucus. No maxillary, frontal, ethmoid sinus tenderness.  Oropharynx: Lips, oral mucosa, tongue pink without gingival lesions. Pharynx without lesions, exudate.  Neck: Supple. No adenopathy or thyromegaly.    Respiratory: Bilateral lungs clear to auscultation, good air entry, no wheezes, rhonchi or rales.   Cardiovascular: RRR. S1 and S2 normal, no murmurs, clicks, gallops or rubs. No JVD. No carotid bruit. No edema. Peripheral pulses intact.   GI: Soft,Normal BS present. Abdomen soft without tenderness, guarding, mass or organomegaly.  Skin: Color, texture, turgor  normal.  No rash, lesions.   Musculoskeletal: Spine ROM normal. Muscular strength intact.   Neuro: Gait normal. Reflexes normal and symmetric. Sensation grossly normal. CN 2-12 intact.     ASSESSMENT & PLAN:  (Z00.00) Visit for preventive health examination  (primary encounter diagnosis)  Comment:Comment: Well woman.   Allergies and medications reviewed. Problem list updated. HM updated  1) preventive care  -- mammogram: up to date  -- colon cancer screening: up to date  -- cervical cancer screening: not indicated  -- labwork: orders in Epic  -- hep B risk: completed, low risk  -- Depression screening: not addressed  -- Alcohol screening: neg  -- Drug screening:neg  -- health care proxy: not addressed  -- Immunization: reviewed, Tdap up to date  -- Routine dental care discussed  -- Routine eye care discussed  -- For general health, stay well hydrated, eat healthy at regular intervals (foods rich in dietary fiber, low sugar concentration, minimally processed are always preferred), sleep at least 8h at night, exercise regularly.  -- return annually or prn if issues arise  --  My CHArt: reviewed in AVS    (E66.9) Non morbid obesity, unspecified obesity type  Comment: letter of support, to be fax[facility] to [facility]. New referral in place  Plan: REFERRAL TO ENDOCRINOLOGY (EXT)            (E78.5) Hyperlipidemia, unspecified hyperlipidemia  Comment: Pt admits not taking atorvastatin. Recheck lipids. Encouraged to continue on medication given other vascular risks (HTN). We will further discuss at next visit.    Plan: atorvastatin (LIPITOR) 20 MG tablet, LIPID         PANEL, COLLECTION VENOUS BLOOD VENIPUNCTURE            (I11.9) Benign hypertensive heart disease without heart failure  Comment: Better controlled after adding HCTZ. Pt did not follow with pharmacotherapy, declines new appt for now.   Plan: Continue current regimen. Focus on life style, sodium intake, wt mgmt    (G47.00) Insomnia, unspecified  Comment: refill, to take  prn  Plan: traZODone (DESYREL) 50 MG tablet            (R30.0) Dysuria  Comment: udip with neg nitrites, leuks. Trace lysed blood.  Discussed increasing water intake, pt admits not drinking enough water during the day.  Monitor  Plan: URINE DIP (POINT OF CARE)          The patient was ready to learn and no apparent learning or adherence barriers were identified. I  explained the diagnosis and treatment plan, and the patient expressed understanding of the content. I attempted to answer any questions regarding the diagnosis and the proposed treatment.    We discussed the patients current medications. The patient expressed understanding and no barriers to adherence were identified.    follow-up will be scheduled September, to FU on wt mgmt, recheck BP  she has been advised to call or return with any worsening or new problems

## 2015-06-02 ENCOUNTER — Ambulatory Visit (HOSPITAL_BASED_OUTPATIENT_CLINIC_OR_DEPARTMENT_OTHER): Payer: No Typology Code available for payment source | Admitting: Gastroenterology

## 2015-08-19 ENCOUNTER — Ambulatory Visit (HOSPITAL_BASED_OUTPATIENT_CLINIC_OR_DEPARTMENT_OTHER): Payer: Medicaid Other | Admitting: Internal Medicine

## 2015-11-18 ENCOUNTER — Ambulatory Visit (HOSPITAL_BASED_OUTPATIENT_CLINIC_OR_DEPARTMENT_OTHER): Payer: Medicaid Other | Admitting: Internal Medicine

## 2015-11-18 VITALS — BP 140/63 | HR 72 | Temp 96.8°F | Ht 64.0 in | Wt 223.4 lb

## 2015-11-18 DIAGNOSIS — E611 Iron deficiency: Secondary | ICD-10-CM

## 2015-11-18 DIAGNOSIS — R52 Pain, unspecified: Secondary | ICD-10-CM

## 2015-11-18 DIAGNOSIS — K2950 Unspecified chronic gastritis without bleeding: Secondary | ICD-10-CM

## 2015-11-18 DIAGNOSIS — E559 Vitamin D deficiency, unspecified: Secondary | ICD-10-CM

## 2015-11-18 DIAGNOSIS — E6609 Other obesity due to excess calories: Secondary | ICD-10-CM

## 2015-11-18 MED ORDER — CHOLECALCIFEROL 25 MCG (1000 UT) PO TABS
1000.0000 [IU] | ORAL_TABLET | Freq: Every day | ORAL | 11 refills | Status: DC
Start: 2015-11-18 — End: 2015-11-18

## 2015-11-18 MED ORDER — VITAMIN D (ERGOCALCIFEROL) 1.25 MG (50000 UT) PO CAPS
1.0000 | ORAL_CAPSULE | ORAL | 0 refills | Status: DC
Start: 2015-11-18 — End: 2016-03-17

## 2015-11-18 MED ORDER — RANITIDINE HCL 150 MG PO TABS
150.00 mg | ORAL_TABLET | Freq: Two times a day (BID) | ORAL | 1 refills | Status: AC
Start: 2015-11-18 — End: 2016-01-17

## 2015-11-18 MED ORDER — FERROUS SULFATE 325 (65 FE) MG PO TABS
325.00 mg | ORAL_TABLET | Freq: Three times a day (TID) | ORAL | 1 refills | Status: AC
Start: 2015-11-18 — End: 2016-01-17

## 2015-11-18 NOTE — Progress Notes (Signed)
SUBJECTIVE  Mariah [name] is a 59 year old female, [place] speaker, well known to this provider.  I last saw her in June    Interim hx  - -Obesity - connected with [facility], plan for bariatric surgery in February/17  Had 2 appt with the nutritionist so far, and surgeon  Got a call today letting her know that vit D and iron were low. She asks me to send her the rx to [place], rather than she going to [facility]    -- Epigastric pain - tried ranitidine from a friend, found it very helpful. Would like to have it rx.     -- Body aches - a chronic problem, that was previously evaluated without a clear diagnosis. Admits to a lot of stressors, having no time for self. Has taken very few vacation in the last 10 years, and when she does, she feels a lot better. Works as Electrical engineer, drives long distances daily.    HTN  Pt reports taking medications daily. Denies chest pain, SOB, DOE, palpitations, dizziness, head aches, leg edema.   Most Recent BP Reading(s)  11/18/15 : 140/63  05/27/15 : 140/69  04/13/15 : 138/86  04/01/15 : 164/74  02/12/15 : 130/60    Most Recent Weight Reading(s)  11/18/15 : 101.3 kg (223 lb 6.4 oz)  05/27/15 : 97.5 kg (215 lb)  02/12/15 : 98.4 kg (217 lb)  01/20/15 : 98.5 kg (217 lb 3.2 oz)  01/14/15 : 96.9 kg (213 lb 9.6 oz)    ROS: No fevers or unexplained weight loss. No visual changes. No sore throat or ear ache. No cough. No abdominal pain or change in bowel habits. Diffuse body aches. No new rashes or skin changes. No paresthesias or new headaches. No enlarged nodes. No new itching, sneezing or wheezing.    OBJECTIVE:  BP 140/63  Pulse 72  Temp 96.8 F (36 C) (Temporal)  Ht 5\' 4"  (1.626 m)  Wt 101.3 kg (223 lb 6.4 oz)  SpO2 100%  BMI 38.35 kg/m2  Pleasant, in NAD    Psych: well nourished, well groomed, appropriate affect. Speaking in full sentences, A&O x 3, good recollection of events. No delusions. No SI/HI    AWQ:  PHQ-2 = 2  Alcohol screen: less than once a month, has 1-2 drinks  at a time  Drug screen:  neg  General health: poor  Intervention:see discussion below    ASSESSMENT & PLAN:  (K29.50) Other chronic gastritis  (primary encounter diagnosis)  Comment:   Plan: ranitidine (ZANTAC) 150 MG tablet            (E55.9) Vitamin D deficiency  Comment: noted at [facility], Care Everywhere reviewed, vit D = 23  Plan: Ergocalciferol 50,000 units,             (E61.1) Low iron  Comment: noted at [facility], Care Everywhere reviewed, 46  Plan: ferrous sulfate 325 (65 FE) MG tablet        (E66.09) Obesity due to excess calories, unspecified obesity severity  Comment: BMI 38.35. Pt is being followed at [facility], with planned bariatric surgery for February/17  Plan (per [facility] chart): Obesity: Patient meets NIH criteria for WLS. Good candidate. Needs to do few things to prepare for surgery. Patient has made lifestyle changes and needs to do the following:   -diet: more protein in AM, less refined carbs, more vegetables   -exercise: [employer] is the goal   -start weight loss medication: lomaira,  discussed contraception, had Tubal and bilateral oophrectomy   -need to lose 10 lbs to get to pre op goal  -lab work today prepare for surgery       (R52) Body aches  Comment: Chronic problem,  likely related to obesity, OA, and current life style/sedentary life style. Pt has no time for self. We reviewed this. Pt encouraged to find time to dedicate to herself, reevaluate her day to day life.  We had a lengthy discussion about howlife style can affect the way one feels, and that it is important to keep a balance between work, social life, and leisure time. Sleep hygiene and balanced nutrition discussed. Regular physical activity encouraged.   Plan: Pt says that the conversation resonated with the way she feels, she says that she usually feels better when she comes to see me, "just talking to you makes me feel better". She's considering seeing a private [place] therapist in [place], that a friend sees, and that has  helped her friend a lot.     The patient was ready to learn and no apparent learning or adherence barriers were identified. I explained the diagnosis and treatment plan, and the patient expressed understanding of the content. I attempted to answer any questions regarding the diagnosis and the proposed treatment.    We discussed the patients current medications. The patient expressed understanding and no barriers to adherence were identified.    follow-up will be scheduled 3 months  she has been advised to call or return with any worsening or new problems

## 2015-12-14 ENCOUNTER — Telehealth (HOSPITAL_BASED_OUTPATIENT_CLINIC_OR_DEPARTMENT_OTHER): Payer: Self-pay

## 2015-12-14 ENCOUNTER — Ambulatory Visit (HOSPITAL_BASED_OUTPATIENT_CLINIC_OR_DEPARTMENT_OTHER): Payer: Medicaid Other | Admitting: Internal Medicine

## 2015-12-14 VITALS — BP 144/77 | HR 86 | Temp 97.8°F | Ht 64.0 in | Wt 219.0 lb

## 2015-12-14 DIAGNOSIS — R300 Dysuria: Secondary | ICD-10-CM

## 2015-12-14 LAB — POC URINALYSIS
BILIRUBIN, URINE: NEGATIVE
GLUCOSE,URINE: NEGATIVE
KETONE, URINE: NEGATIVE
NITRITE, URINE: NEGATIVE
PH URINE: 5.5 (ref 5.0–8.0)
PROTEIN, URINE: 30 — AB
SPECIFIC GRAVITY, URINE: >=1.03 (ref 1.003–1.030)
UROBILINOGEN URINE: 0.2 (ref 0.2–1.0)

## 2015-12-14 MED ORDER — PHENAZOPYRIDINE HCL 100 MG PO TABS
100.00 mg | ORAL_TABLET | Freq: Three times a day (TID) | ORAL | 0 refills | Status: AC | PRN
Start: 2015-12-14 — End: 2015-12-19

## 2015-12-14 MED ORDER — SULFAMETHOXAZOLE-TRIMETHOPRIM 800-160 MG PO TABS
1.00 | ORAL_TABLET | Freq: Two times a day (BID) | ORAL | 0 refills | Status: AC
Start: 2015-12-14 — End: 2015-12-19

## 2015-12-14 NOTE — Telephone Encounter (Signed)
-----   Message from [facility] sent at 12/14/2015 11:32 AM EST -----  Regarding: sick call  Contact: [phone]          CONFIRMED TODAY: Yes    CALL BACK NUMBER: [phone]    Available times:    Patient's language of care: [place]    Patient needs a [place] interpreter.    Patient's PCP: Lawernce Pitts APRN    Person calling on behalf of patient: Patient (self)    Calls today with a sick call. Possible UTI infection. Called for a prescription. I offer app, but pt requested a prescription

## 2015-12-14 NOTE — Progress Notes (Addendum)
[facility]    CC: urine issue    HPI: Mariah [name] is a 59 year old female who presents to clinic for:    - having a lot of pain when she urinates. For 3 days. Lots of burning feeling deep within urine canal. Has to use a lot of effort to urinate, almost as if having bowel movement. No increased frequency, but goes a lot at baseline. No blood. Also having some urine incontinence with any movement, not new.   -  No fever/chill/nausea.  - no vaginal symptoms at all. No itching/discharge.     - reviewed past urine cultures - about 1x/year for the past year. Last one resistant to nitrofurantoin.     Review of systems:     Gen:No fevers or unexplained weight loss.   Neuro: No visual changes. No paresthesias or new headaches.   HEENT: No sore throat or ear ache.    Pulm: No cough. No dyspnea.  Cardiac: No chest pain on exertion.    Msk: No new or unusual musculoskeletal symptoms.   Derm: No new rashes or skin changes.    Psych: No sadness or anxiety that interferes with day-to-day activities.   Allergy: No enlarged nodes.  No new itching, sneezing or wheezing.      Medications, Allergies, Problem list, and Social history were all reviewed and updated in Epic as appropriate.       Physical Exam:  BP 144/77  Pulse 86  Temp 97.8 F (36.6 C) (Oral)  Ht 5\' 4"  (1.626 m)  Wt 99.3 kg (219 lb)  SpO2 100%  BMI 37.59 kg/m2    Gen: well appearing female in no distress  Psych: mood is good, affect is bright with normal range, speech fluent and coherent  HEENT: PERRL, EOMI, no icterus.   Abd: soft, discomfort on suprapubic palpation, makes her feel she has to urinate  Ext: 2+ pedal pulses bilaterally, no lower extremity edema  Neuro: strength and sensation grossly intact bilaterally. Gait is narrow-based and stable.  Skin: no rashes or lesions, no skin breakdown on lower extremities.    Udip: + for trace blood only    ASSESMENT/PLAN:  (R30.0) Dysuria  (primary encounter diagnosis)  Comment: completely  typical symptoms, though without LE on dip. Will tx presumptively, especially given culture-positive infections in the past  Plan: sulfamethoxazole-trimethoprim (BACTRIM DS)         800-160 MG per tablet, phenazopyridine         (PYRIDIUM) 100 MG tablet, URINE CULTURE        Call if not improved with tx.       **Addendum: I was initially looking at a urine dip results from earlier this year. Today's urine result shows small blood and + LE. No change in plan, but this more clearly fits with her symptoms.     The patient was ready to learn and no apparent learning or adherence barriers were identified. I explained the diagnosis and treatment plan, and the patient expressed understanding of the content. I attempted to answer any questions regarding the diagnosis and the proposed treatment.    Possible side effects of the new prescribed medication were explained, including stomach upset. We discussed the patients current medications.  We discussed the importance of medication compliance. The patient expressed understanding and no barriers to adherence were identified.    she has been advised to call or return with any worsening or new problems  to call or return with any worsening or new problems        Wells Guiles E. Stann Mainland, MD

## 2015-12-14 NOTE — Progress Notes (Signed)
Pt c/o burning pain with urination. Feels like she has to urinate all the time. Began 2 days ago. Denies fever or back pain. Appt scheduled for today.

## 2015-12-15 LAB — URINE CULTURE

## 2016-02-17 ENCOUNTER — Encounter (HOSPITAL_BASED_OUTPATIENT_CLINIC_OR_DEPARTMENT_OTHER): Payer: Self-pay

## 2016-03-01 ENCOUNTER — Other Ambulatory Visit (HOSPITAL_BASED_OUTPATIENT_CLINIC_OR_DEPARTMENT_OTHER): Payer: Self-pay | Admitting: Internal Medicine

## 2016-03-01 DIAGNOSIS — K219 Gastro-esophageal reflux disease without esophagitis: Secondary | ICD-10-CM

## 2016-03-01 NOTE — Progress Notes (Signed)
PER Patient (self), Mariah [name] is a 60 year old female has requested a refill of Omeprazole 20mg .      Last Office Visit: 12/14/15  Last Physical Exam: 05/27/15      Other Med Adult:  Most Recent BP Reading(s)  12/14/15 : 144/77          CHOLESTEROL (mg/dL)   Date Value   06/09/2014 198   ----------    LOW DENSITY LIPOPROTEIN DIRECT (mg/dL)   Date Value   06/09/2014 130   ----------    HIGH DENSITY LIPOPROTEIN (mg/dL)   Date Value   06/09/2014 53   ----------    TRIGLYCERIDES (mg/dL)   Date Value   06/09/2014 204 (H)   ----------        THYROID SCREEN TSH REFLEX FT4 (uIU/mL)   Date Value   06/09/2014 2.040   ----------        TSH (THYROID STIM HORMONE) (uIU/mL)   Date Value   04/03/2012 1.68   ----------    No results found for: HGBA1C        INR (no units)   Date Value   04/28/2014 < 1.0   01/18/2007 1.0 (L)   ----------       Documented patient preferred pharmacies:    [facility]  Phone: [phone] Fax: [phone]

## 2016-03-01 NOTE — Progress Notes (Signed)
[facility]    Person calling on behalf of patient: Patient (self)    May list multiple medications in this section    Medicine Name: omeprazole (PRILOSEC)     Dosage: 20 MG capsule    Frequency (how many pills, how many times a day): Take 1 capsule by mouth 2 (two) times daily. - Oral    Number of pills left: N/A    Documented patient preferred pharmacies:   [facility]  Phone: [phone] Fax: [phone]      CALL BACK NUMBER: [phone]

## 2016-03-02 MED ORDER — OMEPRAZOLE 20 MG PO CPDR
20.0000 mg | DELAYED_RELEASE_CAPSULE | Freq: Every day | ORAL | 5 refills | Status: DC
Start: 2016-03-02 — End: 2016-08-24

## 2016-03-30 ENCOUNTER — Ambulatory Visit (HOSPITAL_BASED_OUTPATIENT_CLINIC_OR_DEPARTMENT_OTHER): Payer: Medicaid Other | Admitting: Registered Nurse

## 2016-03-30 DIAGNOSIS — Z23 Encounter for immunization: Secondary | ICD-10-CM

## 2016-03-30 NOTE — Progress Notes (Signed)
03/30/2016  VIS given prior to administration and reviewed with the patient and or legal guardian. Patient understands the disease and the vaccine. See immunization/Injection module or chart review for date of publication and additional information.  Fransisco Beau, RN

## 2016-04-02 ENCOUNTER — Emergency Department (HOSPITAL_BASED_OUTPATIENT_CLINIC_OR_DEPARTMENT_OTHER)
Admission: RE | Admit: 2016-04-02 | Disposition: A | Discharge status: Home / Self Care | Payer: Self-pay | Source: Emergency Department | Attending: Emergency Medicine | Admitting: Emergency Medicine

## 2016-04-02 LAB — XR HAND LEFT MINIMUM 3 VIEWS

## 2016-04-02 LAB — XR FOREARM LEFT 2 VIEWS

## 2016-04-02 LAB — XR ELBOW LEFT MINIMUM 3 VIEWS

## 2016-04-02 LAB — XR HAND RIGHT MINIMUM 3 VIEWS

## 2016-04-02 MED ORDER — IBUPROFEN 600 MG PO TABS
600.00 mg | ORAL_TABLET | Freq: Four times a day (QID) | ORAL | 0 refills | Status: AC | PRN
Start: 2016-04-02 — End: 2016-04-09

## 2016-04-02 MED ORDER — IBUPROFEN 600 MG PO TABS
600.0000 mg | ORAL_TABLET | Freq: Once | ORAL | Status: AC
Start: 2016-04-02 — End: 2016-04-02
  Administered 2016-04-02: 600 mg via ORAL
  Filled 2016-04-02: qty 1

## 2016-04-02 MED ORDER — ACETAMINOPHEN 325 MG PO TABS
650.00 mg | ORAL_TABLET | Freq: Four times a day (QID) | ORAL | 0 refills | Status: AC | PRN
Start: 2016-04-02 — End: 2016-05-02

## 2016-04-02 NOTE — Narrator Note (Signed)
Patient Disposition    Patient education for diagnosis, medications, activity, diet and follow-up.  Patient left ED 2:42 PM.  Patient rep received written instructions.  Interpreter to provide instructions: No    Patient belongings with patient: YES      Have all existing LDAs been addressed? N/A    Have all IV infusions been stopped? N/A    Discharged to: Discharged to home

## 2016-04-02 NOTE — ED Triage Note (Signed)
ambuatory to ED  Alert  Slipped on grand-daughters toys  C/O left arm and right hand pain  Swelling noted to right hand  Denies head injury

## 2016-04-02 NOTE — Discharge Instructions (Signed)
Radial Head Fracture  A radial head fracture is a break of the smaller bone (radius) in the forearm. The head of this bone is the part near the elbow. These fractures commonly happen during a fall, when you land on an outstretched arm. These fractures are more common in middle aged adults and are common with a dislocation of the elbow.  SYMPTOMS    Swelling of the elbow joint and pain on the outside of the elbow.   Pain and difficulty in bending or straightening the elbow.   Pain and difficulty in turning the palm of the hand up or down with the elbow bent.  DIAGNOSIS   Your caregiver may make this diagnosis by a physical exam. X-rays can confirm the type and amount of fracture. Sometimes a fracture that is not displaced cannot be seen on the original X-ray.  TREATMENT   Radial head fractures are classified according to the amount of movement (displacement) of parts from the normal position.   Type 1 Fractures   Type 1 fractures are generally small fractures in which bone pieces remain together (nondisplaced fracture).   The fracture may not be seen on initial X-rays. Usually if X-rays are repeated two to three weeks later, the fracture will show up. A splint or sling is used for a few days. Gentle early motion is used to prevent the elbow from becoming stiff. It should not be done vigorously or forced as this could displace the bone pieces.  Type 2 Fractures   With type 2 fractures, bone pieces are slightly displaced and larger pieces of bone are broken off.   If only a little displacement of the bone piece is present, splinting for 4 to 5 days usually works well. This is again followed with gentle active range of motion. Small fragments may be surgically removed.   Large pieces of bone that can be put back into place will sometimes be fixed with pins or screws to hold them until the bone is healed. If this cannot be done, the fragments are removed. For older, less active people, sometimes the entire radial  head is removed if the wrist is not injured. The elbow and arm will still work fine. Soft tissue, tendon, and ligament injuries are corrected at the same time.  Type 3 Fractures   Type 3 fractures have multiple broken pieces of bone that cannot be fixed. Surgery is usually needed to remove the broken bits of bone and what is left of the radial head. Soft-tissue damage is repaired. Gentle early motion is used to prevent the elbow from becoming stiff. Sometimes an artificial radial head can be used to prevent deformity if the elbow is unstable.  Rest, ice, elevation, immobilization, medications, and pain control are used in the early care.  HOME CARE INSTRUCTIONS    Keep the injured part elevated while sitting or lying down. Keep the injury above the level of your heart (the center of the chest). This will decrease swelling and pain.   Apply ice to the injury for 15-20 minutes, 03-04 times per day while awake, for 2 days. Put the ice in a plastic bag and place a towel between the bag of ice and your cast or splint.   Move your fingers to avoid stiffness and minimize swelling.   If you have a plaster or fiberglass cast:    Do not try to scratch the skin under the cast using sharp or pointed objects.    Check the skin around  the cast every day. You may put lotion on any red or sore areas.    Keep your cast dry and clean.   If you have a plaster splint:    Wear the splint as directed.    You may loosen the elastic around the splint if your fingers become numb, tingle, or turn cold or blue.   Do not put pressure on any part of your cast or splint. It may break. Rest your cast only on a pillow for the first 24 hours until it is fully hardened.   Your cast or splint can be protected during bathing with a plastic bag. Do not lower the cast or splint into the water.   Only take over-the-counter or prescription medicines for pain, discomfort, or fever as directed by your caregiver.   Follow all instructions for  follow-up with your caregiver. This includes any orthopedic referrals, physical therapy, and rehabilitation. Any delay in obtaining necessary care could result in a delay or failure of the bones to heal or permanent elbow stiffness.   Do not overdo exercises. This could further damage your injury.  SEEK IMMEDIATE MEDICAL CARE IF:    Your cast or splint gets damaged or breaks.   You have more severe pain or swelling than you did before getting the cast.   You have severe pain when stretching your fingers.   There is a bad smell, new stains, and/or pus-like (purulent) drainage coming from under the cast.   Your fingers or hand turn pale or blue, become cold, or you lose feeling.     This information is not intended to replace advice given to you by your health care provider. Make sure you discuss any questions you have with your health care provider.     Document Released: 09/25/2006 Document Revised: 12/25/2014 Document Reviewed: 11/02/2009  Elsevier Interactive Patient Education 2016 [place] no punho  (Wrist Pain)  Leses no punho so frequentes em adultos e crianas. Uma entorse  uma leso nos ligamentos que mantm seus ossos unidos. Uma distenso  uma leso no msculo ou nas estruturas semelhantes a cordas [employer] (tendes) decorrente de um estiramento ou trao. Em geral, quando os punhos esto moderadamente sensveis ao toque aps uma queda ou leso, pode haver uma quebra no osso (fratura). [place] entorses ou distenses no punho melhoram em 3 a 5 dias, mas a Conservation officer, nature.  INSTRUES PARA TRATAMENTO DOMICILIAR    Coloque gelo na rea afetada.    Coloque gelo em uma sacola plstica.    Coloque uma toalha entre a pele e a sacola.    Deixe o gelo por 15 a 20 minutos, 3 a 4 vezes por dia, nos primeiros 2 dias, ou conforme orientado pelo seu mdico.   Mantenha seu brao elevado acima do nvel do corao sempre que possvel para reduzir o inchao e a  dor.   Deixe a rea lesionada em repouso por pelo menos 48 horas ou conforme orientado por seu mdico.   Se um gesso ou bandagem plstica tiver sido aplicado, use-o pelo tempo determinado pelo seu mdico ou at que seja examinado por um mdico no exame de acompanhamento.   Somente tome medicamentos de venda livre ou vendidos com prescrio para dor, desconforto ou febre conforme indicao do seu mdico.   V a todas as consultas de acompanhamento. Voc pode precisar de acompanhamento com um especialista ou fazer exames de raios-X de acompanhamento. A melhora no nvel  da dor no  uma garantia de que voc no fraturou um osso em seu punho. A nica maneira de determinar se voc tem ou no um osso quebrado  por meio de um exame de raios-X.  PROCURE UM MDICO IMEDIATAMENTE SE:    Seus dedos estiverem inchados, muito vermelhos, brancos ou frios e azuis.   Seus dedos estiverem dormentes ou formigando.   Chamisal dor.   Tiver dificuldade de movimentar os dedos.  CERTIFIQUE-SE DE:    Compreender estas instrues.   [place].   Procurar um mdico imediatamente se no se sentir bem ou piorar.     Estas informaes no se destinam a substituir as recomendaes de seu mdico. No deixe de discutir quaisquer dvidas com seu mdico.     Document Released: 12/04/2005 Document Revised: 12/09/2013  Elsevier Interactive Patient Education [employer].

## 2016-04-02 NOTE — ED Provider Notes (Signed)
Review of Patient's Allergies indicates:   Heparin                     Comment:local rash seen during hospitalization Feb 2008   Lisinopril              Cough    History   Patient presents with:  Fall: LEG AND HAND INJURY FROM A FALL  Arm Pain  Hand Pain      HPI Comments: [place] interpreter used.    Patient is 60 year old female with past medical history of hypertension presents with mechanical slip and fall at home, slipped on grandchild's toys, fell forward. Denies head trauma, LOC, vomiting, vision changes, neck pain, numbness, weakness. Pt c/o bilateral hand pain, left forearm/elbow pain, notes right hand swelling. Did not take any medications prior to arrival.     Fall   Pertinent negatives include no nausea, no vomiting and no headaches.   Arm Pain   Associated symptoms: no back pain and no neck pain    Hand Pain   Associated symptoms: no back pain and no neck pain        Past Medical History:  03/27/2006: ABN FIND-STOOL CONTENTS-OCC BLOOD      Comment: s/p colonoscopy and EGD 7/07  03/27/2006: ABN FIND-STOOL CONTENTS-OCC BLOOD      Comment: s/p colonoscopy and EGD 7/07  06/04/2006: ABSENCE OF MENSTRUATION - s/p TAH BSO  08/05/2004: ANXIETY DEPRESSION      Comment: TSH wnl 8/02; trial of fluoxetine 8/05  08/05/2004: BACK PAIN LOW      Comment: LBP musculoskeletal most likely 1/04  04/23/2006: Cough      Comment: s/p PFTs September 2007 --  some obstruction                at mid to low lung volumes not correctable by                bronchodilator CT scan showed stable pulmonary                nodules over several years 11/07 - cough gone                ** note combined with dx of "nodules on CT                scan". Will file this note to hx.     04/23/2006: Cough      Comment: s/p PFTs September 2007 --  some obstruction                at mid to low lung volumes not correctable by                bronchodilator CT scan showed stable pulmonary                nodules over several years 11/07 - cough gone                 ** note combined with dx of "nodules on CT                scan". Will file this note to hx.     08/05/2004: depressive disorder      Comment: TSH wnl 8/02; trial of fluoxetine x 6 wks                didn't really help 8/05  trial of citalopram  per Dr. Patrice Paradise. later resolved. 04/15/2014 -                Reassessed, pt reports feeling well, with no sx               of depression. Just reports being tired, which                she attributes to current life style. Will                resolve from problem list.    03/27/2006: Diarrhea      Comment: 7/07 The colonoscopy was visually normal and                biopsies taken to rule out microscopic colitis                were negative.  A biopsy of the descending                duodenum to rule out celiac sprue because of                the diarrhea was negative.    03/27/2006: Diarrhea      Comment: 7/07 The colonoscopy was visually normal and                biopsies taken to rule out microscopic colitis                were negative.  A biopsy of the descending                duodenum to rule out celiac sprue because of                the diarrhea was negative.    12/19/2012: Gastritis      Comment: Combined with entry "esophageal reflux    10/22/2006: Hyperparathyroidism      Comment: osteoporosis found - pt referred to surgery                for the hyperparathyroidism- s/p                parathyroidectomy feb 2008  06/18/2012: Inconclusive mammogram      Comment: June 2013 - BIRADS 3. Probable benign                mammogram. Recommend 6 month  follow-up                mammogram (Dec 2013).  Dec/13 - Stable left                breast nodule with no evidence of malignancy.                BIRADS 2 benign recommend annual screening                mammogram in 6 months  (June 2014) June/14 -                mammogram request given to pt, she will call to               schedule:  Jan/15 - normal mammogram, yearly                screening. Resolve from problem  list.        03/27/2006: nodules on chest CT 1/06 and 3/07      Comment: s/p PFTs September 2007 --  some obstruction  at mid to low lung volumes not correctable by                bronchodilator CT scan showed stable pulmonary                nodules over several years 11/07 - cough gone                 04/29/12 - saw pneumologist, Dr Jordan Hawks, "Will                repeat chest CT with high-resolution scans at                the 2 month [facility] (already ordered). If                abnormal, would do PFT's and further rheum w/u,               consider lung bx . Pt to call if any r  08/05/2004: OBESITY NOS      Comment: has tried meds from [place]  08/05/2004: PERS HX OF PAST NONCOMPLIANCE      Comment: poor compliance to med regimen  08/05/2004: PERS HX TOBACCO USE      Comment: quit 10/02; 1/2 PPD since 60 YO  08/05/2004: Screening for hypertension      Comment: occ elevated BP started on anti-HTN in [place];               chem 7, EKG, HCT u/a wnl 4/03; d/c'ed HCTZ 8/05               when we learned from daughter she has not been                taking it  05/19/2008: Vitamin D deficiency      Comment: Supplement per dr Jake Samples.    Prior to Admission medications :  Medication omeprazole (PRILOSEC) 20 MG capsule, Sig Take 1 capsule by mouth daily, Start Date 03/02/16, End Date 03/03/17, Taking? , Authorizing Provider Marquette Saa    Medication hydrochlorothiazide (HYDRODIURIL) 25 MG tablet, Sig Take 1 tablet by mouth daily., Start Date 04/13/15, End Date 04/12/16, Taking? , Authorizing Provider Marquette Saa    Medication losartan (COZAAR) 100 MG tablet, Sig Take 1 tablet by mouth daily., Start Date 04/13/15, End Date 07/21/16, Taking? , Authorizing Provider Marquette Saa    Medication Biotin 1000 MCG TABS Tablet, Sig Take 1,000 mcg by mouth daily., Start Date , End Date , Taking? , Authorizing Provider Provider Historical        Past Surgical History:  2006: PARATHYROIDECTOMY      Comment: partial,   5/00:  SALPINGO-OOPHORECTOMY COMPL/PRTL UNI/BI SPX      Comment: Salpingo-Oophorectomy, bilateral  No date: TONSILLECTOMY ONE-HALF <AGE 45  5/00: TOTAL ABDOMINAL HYSTERECT W/WO RMVL TUBE OVARY      Comment: Hysterectomy, Total Abdominal secondary to                fibroids      Family History    Cancer - Lung Father     Comment: died from lung CA- throat cancer per pt. Also on dyalisis for 10 years    Hypertension Mother     Lipids Mother     Comment: hyperlipidemia    Heart Maternal Uncle     Comment: died 37 from MI    Diabetes Maternal Aunt     Diabetes Mother     Comment: also  morbidly weight    Cancer - Other Brother     Comment: liver cancer, Sept 2013    Non-contributory Brother     Comment: no first deg rel. w/ breast ca       Smoking status: Former Smoker                                                              Packs/day: 0.50      Years: 40.00        Types: Cigarettes     Quit date: 03/31/2012  Smokeless status: Former Systems developer                     Comment: 3-4 cigarretes a day. Quit about 1 month ago  Alcohol use: Yes              Comment: very occasional      Review of Systems   Gastrointestinal: Negative for nausea and vomiting.   Musculoskeletal: Positive for arthralgias. Negative for back pain, neck pain and neck stiffness.   Skin: Positive for wound. Negative for rash.   Neurological: Negative for headaches.       Physical Exam   BP 136/81   Pulse 74   Temp 98.5 F   Resp 16   Wt 96.6 kg (213 lb)   SpO2 97%   BMI 36.56 kg/m2    Physical Exam   Constitutional: She is oriented to person, place, and time. She appears well-developed and well-nourished.   HENT:   Head: Normocephalic and atraumatic.   Eyes: Conjunctivae are normal.   Neck: Normal range of motion.   No midline c-spine ttp   Cardiovascular: Normal rate.    Pulmonary/Chest: Effort normal. She exhibits no tenderness.   Abdominal: Soft. There is no tenderness.   Musculoskeletal: Normal range of motion. She exhibits edema and tenderness.   Swelling to  right lateral hand and ttp at base of 5th metacarpal, sensation distally intact, good cap refill, motor intact  Swelling to left wrist, distal radius, no deformity. +ttp.  +ttp to left olecranon, FROM but pain on movement  Left distal sensation intact, good cap refill, 2+ pulse   Neurological: She is alert and oriented to person, place, and time.   Skin: Skin is warm and dry.   Nursing note and vitals reviewed.      ED Course   Procedures    MDM  Number of Diagnoses or Management Options      A/P: Patient is 60 year old female with past medical history as above presents with mechanical slip and fall, fell forward with no head strike, complaining of bilateral hand and left upper arm pain afterwards, the most notable swelling to right lateral hand, concern for fracture.  X-rays of bilateral hands and left upper extremity performed.  Patient given ibuprofen for pain. Xrays notable for left nondisplaced radial head fracture. Pt placed in sling for early mobility, also given right hand removal splint for comfort, encouraged RICE and limitation of medical equipment to prevent joint stiffness. Pt safe for discharge with ortho f/u, ortho to call with appt.    Prior records reviewed.    Ivy Lynn  Attending Physician  [facility]  [phone]

## 2016-04-06 ENCOUNTER — Ambulatory Visit (HOSPITAL_BASED_OUTPATIENT_CLINIC_OR_DEPARTMENT_OTHER): Payer: Medicaid Other | Admitting: Orthopaedic Surgery

## 2016-04-06 DIAGNOSIS — S52125A Nondisplaced fracture of head of left radius, initial encounter for closed fracture: Secondary | ICD-10-CM

## 2016-04-06 MED ORDER — IBUPROFEN 600 MG PO TABS
600.0000 mg | ORAL_TABLET | Freq: Three times a day (TID) | ORAL | 1 refills | Status: DC | PRN
Start: 2016-04-06 — End: 2016-05-18

## 2016-04-06 NOTE — Progress Notes (Addendum)
New patient    Interpreter: A Portugese interpreter was used today via telephone.  This patient was seen in the ED in 04/02/16, and presents today for urgent f/u.  PCP: Lawernce Pitts APRN  Date of injury: 04/02/2016    CC: Right arm pain  DX: Non-displaced radial head fx.    HPI:   -This is a 60 year old portugese female presenting today for evaluation of right arm pain. She is new to this service. A phone translator was used for the H&P during this visit. Pt reported that on 04/02/16, she tripped over grandchild's toy, and fell forward on outstretched hands with elbows extended. Pt felt immediate pain in the left elbow, and went to ED for evaluation. X-rays were taken. She states that since the incident she continues to have pain in her right elbow/arm, "along one of the bones". She reports that she is able to lift objects, but has noted pain with supination/pronated with object in hand. She complains of swelling and bruising of right hand knuckles and digits. She states that her pain, swelling, and bruising has progressively improved since 04/02/16. Pt denies paraesthesias, deformities, numbness along both arms, bilaterally.    Occupation: Electrical engineer    Review of Systems:   ROS was reviewed by myself and Dr. Maryan Puls and has been uploaded to epic.     Allergies and medications: Reviewed and updated in Epic.    Current Outpatient Prescriptions:     ibuprofen (ADVIL,MOTRIN) 600 MG tablet, Take 1 tablet by mouth every 6 (six) hours as needed pain, Disp: 20 tablet, Rfl: 0    acetaminophen (TYLENOL) 325 MG tablet, Take 2 tablets by mouth every 6 (six) hours as needed for Pain, Disp: 20 tablet, Rfl: 0    omeprazole (PRILOSEC) 20 MG capsule, Take 1 capsule by mouth daily, Disp: 30 capsule, Rfl: 5    hydrochlorothiazide (HYDRODIURIL) 25 MG tablet, Take 1 tablet by mouth daily., Disp: 30 tablet, Rfl: 11    losartan (COZAAR) 100 MG tablet, Take 1 tablet by mouth daily., Disp: 30 tablet, Rfl: 11    Biotin  1000 MCG TABS Tablet, Take 1,000 mcg by mouth daily., Disp: , Rfl:     ibuprofen (ADVIL,MOTRIN) 600 MG tablet, Take 1 tablet by mouth every 8 (eight) hours as needed for Pain, Disp: 90 tablet, Rfl: 1  Review of Patient's Allergies indicates:   Heparin                     Comment:local rash seen during hospitalization Feb 2008   Lisinopril              Cough    PMHx: Past Medical History:  03/27/2006: ABN FIND-STOOL CONTENTS-OCC BLOOD      Comment: s/p colonoscopy and EGD 7/07  03/27/2006: ABN FIND-STOOL CONTENTS-OCC BLOOD      Comment: s/p colonoscopy and EGD 7/07  06/04/2006: ABSENCE OF MENSTRUATION - s/p TAH BSO  08/05/2004: ANXIETY DEPRESSION      Comment: TSH wnl 8/02; trial of fluoxetine 8/05  08/05/2004: BACK PAIN LOW      Comment: LBP musculoskeletal most likely 1/04  04/23/2006: Cough      Comment: s/p PFTs September 2007 --  some obstruction                at mid to low lung volumes not correctable by                bronchodilator CT scan showed stable pulmonary  nodules over several years 11/07 - cough gone                ** note combined with dx of "nodules on CT                scan". Will file this note to hx.     04/23/2006: Cough      Comment: s/p PFTs September 2007 --  some obstruction                at mid to low lung volumes not correctable by                bronchodilator CT scan showed stable pulmonary                nodules over several years 11/07 - cough gone                ** note combined with dx of "nodules on CT                scan". Will file this note to hx.     08/05/2004: depressive disorder      Comment: TSH wnl 8/02; trial of fluoxetine x 6 wks                didn't really help 8/05  trial of citalopram                per Dr. Patrice Paradise. later resolved. 04/15/2014 -                Reassessed, pt reports feeling well, with no sx               of depression. Just reports being tired, which                she attributes to current life style. Will                resolve from problem  list.    03/27/2006: Diarrhea      Comment: 7/07 The colonoscopy was visually normal and                biopsies taken to rule out microscopic colitis                were negative.  A biopsy of the descending                duodenum to rule out celiac sprue because of                the diarrhea was negative.    03/27/2006: Diarrhea      Comment: 7/07 The colonoscopy was visually normal and                biopsies taken to rule out microscopic colitis                were negative.  A biopsy of the descending                duodenum to rule out celiac sprue because of                the diarrhea was negative.    12/19/2012: Gastritis      Comment: Combined with entry "esophageal reflux    10/22/2006: Hyperparathyroidism      Comment: osteoporosis found - pt referred to surgery  for the hyperparathyroidism- s/p                parathyroidectomy feb 2008  06/18/2012: Inconclusive mammogram      Comment: June 2013 - BIRADS 3. Probable benign                mammogram. Recommend 6 month  follow-up                mammogram (Dec 2013).  Dec/13 - Stable left                breast nodule with no evidence of malignancy.                BIRADS 2 benign recommend annual screening                mammogram in 6 months  (June 2014) June/14 -                mammogram request given to pt, she will call to               schedule:  Jan/15 - normal mammogram, yearly                screening. Resolve from problem list.        03/27/2006: nodules on chest CT 1/06 and 3/07      Comment: s/p PFTs September 2007 --  some obstruction                at mid to low lung volumes not correctable by                bronchodilator CT scan showed stable pulmonary                nodules over several years 11/07 - cough gone                 04/29/12 - saw pneumologist, Dr Jordan Hawks, "Will                repeat chest CT with high-resolution scans at                the 2 month mark (already ordered). If                abnormal, would do PFT's and further  rheum w/u,               consider lung bx . Pt to call if any r  08/05/2004: OBESITY NOS      Comment: has tried meds from [place]  08/05/2004: PERS HX OF PAST NONCOMPLIANCE      Comment: poor compliance to med regimen  08/05/2004: PERS HX TOBACCO USE      Comment: quit 10/02; 1/2 PPD since 60 YO  08/05/2004: Screening for hypertension      Comment: occ elevated BP started on anti-HTN in [place];               chem 7, EKG, HCT u/a wnl 4/03; d/c'ed HCTZ 8/05               when we learned from daughter she has not been                taking it  05/19/2008: Vitamin D deficiency      Comment: Supplement per dr Jake Samples.      Surgical Hx: Past Surgical History:  2006: PARATHYROIDECTOMY      Comment: partial,   5/00:  SALPINGO-OOPHORECTOMY COMPL/PRTL UNI/BI SPX      Comment: Salpingo-Oophorectomy, bilateral  No date: TONSILLECTOMY ONE-HALF <AGE 67  5/00: TOTAL ABDOMINAL HYSTERECT W/WO RMVL TUBE OVARY      Comment: Hysterectomy, Total Abdominal secondary to                fibroids      Family Hx:   Family History    Cancer - Lung Father     Comment: died from lung CA- throat cancer per pt. Also on dyalisis for 10 years    Hypertension Mother     Lipids Mother     Comment: hyperlipidemia    Heart Maternal Uncle     Comment: died 39 from MI    Diabetes Maternal Aunt     Diabetes Mother     Comment: also morbidly weight    Cancer - Other Brother     Comment: liver cancer, Sept 2013    Non-contributory Brother     Comment: no first deg rel. w/ breast ca         Social Hx:   Social History   Marital status: Legally Separated  Spouse name: N/A    Years of education: N/A  Number of children: N/A     Occupational History  None on file     Social History Main Topics   Smoking status: Former Smoker  0.50 Packs/day  For 40.00 Years     Types: Cigarettes    Quit date: 03/31/2012    Smokeless tobacco: Former Systems developer    Comment: 3-4 cigarretes a day. Quit about 1 month ago    Alcohol use Yes    Comment: very occasional    Drug use: No    Sexual  activity: Yes    Partners: Male    Comment: histerectomy     Other Topics Concern   None on file     Social History Narrative    work: Engineer, building services; owned bakery in [place]    Lives w/ daughter 40 YO (Grazielle [place]) & son-in-law Lowella Dandy)    Divorced; currently not in a relationship        05/27/2015    From [place]. Works as Electrical engineer, has own business.     Married, Djaniro.     No regular exercise    Diet: varies. Eats at irregular intervals. Tries to watch diet, but likes to eat    Stressors: work    Pleasures: family, grand children         Physical exam:  Extended Vitals not filed for this encounter.  General/neuro: Alert and oriented, no acute distress, cooperative  HEENT: PERRLA, EOM intact, sclerae anicteric  Neck: no noticeable or palpable swelling, redness or rash around throat or on face  Pulm: breathing comfortably on room air  Skin: warm and dry without rashes, lesions or abrasions  Musculoskeletal:   -Ecchymosis and swelling over MCP, PIP joints of right hand.  -Tender to palpation over radial head in left elbow area. Pain with against resistance with extension and pronation/supination.  -AFROM, but pain with full flexion.  -Median, radial and ulnar nerve is intact, both motor and sensory.  -Brisk capillary refill.   -Radial pulses 2+.    Imaging: X-rays of the left elbow show a non-displaced radial head fx. Otherwise x-rays are unremarkable.  Radiographs were visualized and interpreted by both myself and Dr. Maryan Puls.    Assessment/plan: This is a 60 y/o female who sustained a non-displaced radial head fx of the  left arm. The pt reports improvement of pain over the past several days, and is able to lift objects with minimal discomfort.  The remainder of her pain and swelling is likely acute. We suspect that this injury will continue to improve with time. Pt works as a Chartered certified accountant, and asked about returning to work on Monday. We suggested that she can go back to work, with lifting as tolerated.  Rx'ed 600mg  ibuprofen for pain PRN. F/u in 4-6 weeks to check status of fracture.    Patient was seen, discussed and examined with Dr. Maryan Puls who formulated the assessment and plan. Please see her dictated note as well.  Conchita Paris, 04/06/2016, 12:22 PM

## 2016-04-13 ENCOUNTER — Ambulatory Visit (HOSPITAL_BASED_OUTPATIENT_CLINIC_OR_DEPARTMENT_OTHER): Payer: Medicaid Other | Admitting: Internal Medicine

## 2016-04-15 ENCOUNTER — Encounter (HOSPITAL_BASED_OUTPATIENT_CLINIC_OR_DEPARTMENT_OTHER): Payer: Self-pay

## 2016-04-15 ENCOUNTER — Emergency Department (HOSPITAL_BASED_OUTPATIENT_CLINIC_OR_DEPARTMENT_OTHER)
Admission: RE | Admit: 2016-04-15 | Disposition: A | Discharge status: Home / Self Care | Payer: Self-pay | Source: Emergency Department | Attending: Emergency Medicine | Admitting: Emergency Medicine

## 2016-04-15 LAB — POC URINALYSIS
BILIRUBIN, URINE: NEGATIVE
GLUCOSE,URINE: NEGATIVE
KETONE, URINE: NEGATIVE
LEUKOCYTE ESTERASE: NEGATIVE
NITRITE, URINE: NEGATIVE
PH URINE: 6 (ref 5.0–8.0)
PROTEIN, URINE: NEGATIVE
SPECIFIC GRAVITY, URINE: 1.02 (ref 1.003–1.030)
UROBILINOGEN URINE: 0.2 (ref 0.2–1.0)

## 2016-04-15 MED ORDER — ACETAMINOPHEN 325 MG PO TABS
975.0000 mg | ORAL_TABLET | Freq: Once | ORAL | Status: AC
Start: 2016-04-15 — End: 2016-04-15
  Administered 2016-04-15: 975 mg via ORAL
  Filled 2016-04-15: qty 3

## 2016-04-15 MED ORDER — LIDOCAINE 5 % EX PTCH
1.0000 | MEDICATED_PATCH | Freq: Once | CUTANEOUS | Status: DC
Start: 2016-04-15 — End: 2016-04-15
  Administered 2016-04-15: 1 via TOPICAL
  Filled 2016-04-15: qty 1

## 2016-04-15 MED ORDER — IBUPROFEN 600 MG PO TABS
600.0000 mg | ORAL_TABLET | Freq: Once | ORAL | Status: DC
Start: 2016-04-15 — End: 2016-04-15

## 2016-04-15 MED ORDER — LIDOCAINE 5 % EX PTCH
1.00 | MEDICATED_PATCH | CUTANEOUS | 0 refills | Status: AC
Start: 2016-04-15 — End: 2016-04-22

## 2016-04-15 MED ORDER — CYCLOBENZAPRINE HCL 10 MG PO TABS
10.00 mg | ORAL_TABLET | Freq: Three times a day (TID) | ORAL | 0 refills | Status: AC | PRN
Start: 2016-04-15 — End: 2016-04-20

## 2016-04-15 NOTE — Discharge Instructions (Signed)
Your xrays did not show any problems with the bones in your back.    Apply ice to the painful area for 15-20 minutes 3 times a day, don't put ice directly on your skin so you don't get frostbite.  After 24-48 hours, moist heat may be helpful--apply for 15-20 minutes 3 times a day, be careful that you don't burn yourself.   Take ibuprofen, 600 milligrams, every 8 hours as needed for pain and fever.  You may also take Tylenol, 2 extra strength or 3 regular strength tablets, every 8 hours as needed for pain and fever.    **Do not take more than 3 doses of Tylenol within 24 hours as more will poison your liver.**    (This includes other over-the-counter or prescription medications that contain Tylenol such as cold or migraine medications and Vicodin, Percocet, Tylenol 3, etc..)   Apply 1 lidocaine patch to the most painful area once every 24 hours, leave in place for no more than 18 hours.   Take the Flexeril every 8 hours as needed for muscle spasm.This medication may make you sleepy. No work, driving, or alcohol while taking it.    If your blood pressure was read as greater than 140/90 during your emergency department visit today you should contact your primary care physician today to make a followup appointment to reevaluate your pressure. Initial steps you can take to lower blood pressure are to reduce salt intake in your diet, avoid alcohol, quit smoking, exercise daily.    Please read and follow the below instructions.

## 2016-04-15 NOTE — ED Provider Notes (Signed)
The patient was seen primarily by me. ED nursing record was reviewed. Prior records as available electronically through the Epic record were reviewed.    History, physical exam and disposition were conducted with an official hospital [place] interpreter.    HPI:    This 60 year old female patient with past medical history obesity, anxiety, depression, low back pain, hypertension, gastritis presents to the emergency department complaining of acute onset low back pain that started suddenly while bending over to make her bed yesterday.  Pain is constant, worse with movement.  Does not radiate.  Denies any leg numbness or weakness.  No bowel or bladder dysfunction.  No saddle anesthesia.  No fevers or chills.  Denies any history of back instrumentation or IV drug use.  Has taken ibuprofen for this with some relief, last at 7 AM.  No other acute recent illnesses or symptoms.    ROS: Pertinent positives were reviewed as per the HPI above. All other systems were reviewed and are negative.      Past Medical History/Problem list:  Past Medical History:  03/27/2006: ABN FIND-STOOL CONTENTS-OCC BLOOD      Comment: s/p colonoscopy and EGD 7/07  03/27/2006: ABN FIND-STOOL CONTENTS-OCC BLOOD      Comment: s/p colonoscopy and EGD 7/07  06/04/2006: ABSENCE OF MENSTRUATION - s/p TAH BSO  08/05/2004: ANXIETY DEPRESSION      Comment: TSH wnl 8/02; trial of fluoxetine 8/05  08/05/2004: BACK PAIN LOW      Comment: LBP musculoskeletal most likely 1/04  04/23/2006: Cough      Comment: s/p PFTs September 2007 --  some obstruction                at mid to low lung volumes not correctable by                bronchodilator CT scan showed stable pulmonary                nodules over several years 11/07 - cough gone                ** note combined with dx of "nodules on CT                scan". Will file this note to hx.     04/23/2006: Cough      Comment: s/p PFTs September 2007 --  some obstruction                at mid to low lung volumes not  correctable by                bronchodilator CT scan showed stable pulmonary                nodules over several years 11/07 - cough gone                ** note combined with dx of "nodules on CT                scan". Will file this note to hx.     08/05/2004: depressive disorder      Comment: TSH wnl 8/02; trial of fluoxetine x 6 wks                didn't really help 8/05  trial of citalopram                per Dr. Patrice Paradise. later resolved. 04/15/2014 -  Reassessed, pt reports feeling well, with no sx               of depression. Just reports being tired, which                she attributes to current life style. Will                resolve from problem list.    03/27/2006: Diarrhea      Comment: 7/07 The colonoscopy was visually normal and                biopsies taken to rule out microscopic colitis                were negative.  A biopsy of the descending                duodenum to rule out celiac sprue because of                the diarrhea was negative.    03/27/2006: Diarrhea      Comment: 7/07 The colonoscopy was visually normal and                biopsies taken to rule out microscopic colitis                were negative.  A biopsy of the descending                duodenum to rule out celiac sprue because of                the diarrhea was negative.    12/19/2012: Gastritis      Comment: Combined with entry "esophageal reflux    10/22/2006: Hyperparathyroidism      Comment: osteoporosis found - pt referred to surgery                for the hyperparathyroidism- s/p                parathyroidectomy feb 2008  06/18/2012: Inconclusive mammogram      Comment: June 2013 - BIRADS 3. Probable benign                mammogram. Recommend 6 month  follow-up                mammogram (Dec 2013).  Dec/13 - Stable left                breast nodule with no evidence of malignancy.                BIRADS 2 benign recommend annual screening                mammogram in 6 months  (June 2014) June/14 -                mammogram  request given to pt, she will call to               schedule:  Jan/15 - normal mammogram, yearly                screening. Resolve from problem list.        03/27/2006: nodules on chest CT 1/06 and 3/07      Comment: s/p PFTs September 2007 --  some obstruction                at mid to low lung volumes not correctable by  bronchodilator CT scan showed stable pulmonary                nodules over several years 11/07 - cough gone                 04/29/12 - saw pneumologist, Dr Jordan Hawks, "Will                repeat chest CT with high-resolution scans at                the 2 month mark (already ordered). If                abnormal, would do PFT's and further rheum w/u,               consider lung bx . Pt to call if any r  08/05/2004: OBESITY NOS      Comment: has tried meds from [place]  08/05/2004: PERS HX OF PAST NONCOMPLIANCE      Comment: poor compliance to med regimen  08/05/2004: PERS HX TOBACCO USE      Comment: quit 10/02; 1/2 PPD since 60 YO  08/05/2004: Screening for hypertension      Comment: occ elevated BP started on anti-HTN in [place];               chem 7, EKG, HCT u/a wnl 4/03; d/c'ed HCTZ 8/05               when we learned from daughter she has not been                taking it  05/19/2008: Vitamin D deficiency      Comment: Supplement per dr Jake Samples.  Patient Active Problem List:     Obesity, unspecified     LATERAL FOOT PAIN     Benign hypertensive heart disease without heart failure     Alkaline phosphatase elevation     Insomnia, unspecified     Calculus of kidney     Gastritis     Allergic rhinitis due to other allergen     Osteoporosis     Murmur heart     Family history of throat cancer     Vitamin D deficiency     Subclavian artery stenosis, left (HCC)     Schatzki's ring     Duodenal ulcer, unspecified as acute or chronic, without hemorrhage, perforation, or obstruction      Past Obstetrical/Gynecologic History: No LMP recorded. Patient is postmenopausal.     Past Surgical History: Past  Surgical History:  2006: PARATHYROIDECTOMY      Comment: partial,   5/00: SALPINGO-OOPHORECTOMY COMPL/PRTL UNI/BI SPX      Comment: Salpingo-Oophorectomy, bilateral  No date: TONSILLECTOMY ONE-HALF <AGE 43  5/00: TOTAL ABDOMINAL HYSTERECT W/WO RMVL TUBE OVARY      Comment: Hysterectomy, Total Abdominal secondary to                fibroids    Medications:   Current Facility-Administered Medications:  acetaminophen (TYLENOL) tablet 975 mg 975 mg Oral Once Nakiya Rallis M. Devory Mckinzie   ibuprofen (ADVIL,MOTRIN) tablet 600 mg 600 mg Oral Once Charnice Zwilling M. Deaisa Merida   lidocaine (LIDODERM) 5 % patch 1 patch 1 patch Topical Once Harrie Foreman     Current Outpatient Prescriptions:  ibuprofen (ADVIL,MOTRIN) 600 MG tablet Take 1 tablet by mouth every 8 (eight) hours as needed for Pain Disp: 90 tablet Rfl: 1   acetaminophen (TYLENOL) 325 MG tablet Take 2  tablets by mouth every 6 (six) hours as needed for Pain Disp: 20 tablet Rfl: 0   omeprazole (PRILOSEC) 20 MG capsule Take 1 capsule by mouth daily Disp: 30 capsule Rfl: 5   losartan (COZAAR) 100 MG tablet Take 1 tablet by mouth daily. Disp: 30 tablet Rfl: 11   Biotin 1000 MCG TABS Tablet Take 1,000 mcg by mouth daily. Disp:  Rfl:        Social History:   Social History   Marital status: Legally Separated  Spouse name: N/A    Years of education: N/A  Number of children: N/A     Occupational History  None on file     Social History Main Topics   Smoking status: Former Smoker  0.50 Packs/day  For 40.00 Years     Types: Cigarettes    Quit date: 03/31/2012    Smokeless tobacco: Former Systems developer    Comment: 3-4 cigarretes a day. 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 Works as Electrical engineer, has own business.     Married, Djaniro.     No regular exercise    Diet: varies. Eats at irregular intervals. Tries to watch diet, but likes to eat    Stressors: work    Pleasures: family, grand children     The patient lives at home.   Tobacco: former  EtOH: occ  Street drugs: no    Family History: noncontrib    Allergies:  Review of Patient's Allergies indicates:   Heparin                     Comment:local rash seen during hospitalization Feb 2008   Lisinopril              Cough    Physical Exam:  Patient Vitals for the past 720 hrs:   Temp Pulse Resp BP SpO2   04/15/16 1212 97.6 F 62 20 (!) 180/105 98 %       GENERAL:  WDWN, no acute distress, non-toxic   SKIN:  Warm & Dry, no rash, no petechia.  HEAD:  NCAT. Sclerae are anicteric and aninjected. Airway is patent. Oropharynx is clear with moist mucous membranes. Posterior pharynx without erythema, edema, or exudate. PERRL.  NECK:  No meningismus. No stridor.  LUNGS:  Clear to auscultation bilaterally. No wheezes, rales, rhonchi.   HEART:  RRR. Normal S1 and S2. No murmurs, rubs, or gallops. 2+radial pulses bilaterally.  ABDOMEN:  Soft, NTND.  No masses.  No involuntary guarding or rebound.   EXTREMITIES:  No obvious deformities.  Warm and well perfused.  No calf tenderness. No cyanosis, clubbing, or edema.   Back: No midline C or T-spine tenderness, lower mid L-spine tenderness without crepitus or step-offs.  Also bilateral lumbar paraspinous muscle tenderness.  Negative straight leg raise bilaterally.  NEUROLOGIC:  Alert and oriented x4; moves all extremities well; speaking in clear fluent sentences.  1+ patellar reflexes bilaterally.  Normal strength and sensation bilateral lower extremities.  Somewhat antalgic gait, but able to ambulate without assistance.    PSYCHIATRIC:  Appropriate for age, time of day, and situation    Results:  Imaging:  L spine XR: unremarkable    Laboratory studies  reviewed and within normal limits except as otherwise noted:  Labs Reviewed   POC URINALYSIS - Abnormal; Notable for the following:        Result Value    OCCULT BLOOD, URINE TRACE-LYSED (*)     All other components within normal limits       ED Course and Medical Decision-making:  60 year old female patient presenting w/ acute LBP that started after bending over yesterday. Likely lumbar strain vs bulging disk. No red flag sx or findings to raise concern for more severe etiology such as cauda equina syndrome or spinal epidural abscess. No compression frx or other abnl on L spine films. No UTI.    Incidental elev BP noted, pt w/o sx of acute end-organ damage, not felt to have hypertensive urgency/emergency, and not felt to need further ED w/u or intervention.      Pt medicated in ED w/ po Tylenol and lidoderm patch, d/c home w/ rx for lidoderm patches and flexeril, advised to continue ibuprofen/Tylenol at home, f/u closely w/ PCP.    Reasons to return to the ED were reviewed in detail. The patient agrees with this plan and disposition.      Condition on Discharge: Improved and Stable      Diagnosis/Diagnoses:  Acute midline low back pain without sciatica      Michel Santee, MD        This Emergency Department patient encounter note was created using voice-recognition software and in real time during the ED visit. Please excuse any typographical errors that have not been edited out.

## 2016-04-15 NOTE — ED Triage Note (Addendum)
After bending yesterday, has left sided back pain radiating to buttock.  Took 2 (600 mg) ibuprofen this am with little effect

## 2016-04-15 NOTE — Narrator Note (Signed)
Patient Disposition    Patient education for diagnosis, medications, activity, diet and follow-up.  Patient left ED 2:31 PM.  Patient rep received written instructions.  Interpreter to provide instructions:yes  Patient belongings with patient: yes    Have all existing LDAs been addressed? yes  Have all IV infusions been stopped? n/a  Discharged to: home 4/10 pain level

## 2016-04-17 HISTORY — PX: BARIATRIC SURGERY: SHX1103

## 2016-04-19 ENCOUNTER — Telehealth (HOSPITAL_BASED_OUTPATIENT_CLINIC_OR_DEPARTMENT_OTHER): Payer: Self-pay | Admitting: Ambulatory Care

## 2016-04-19 LAB — XR LUMBAR SPINE 2 OR 3 VIEWS

## 2016-04-19 NOTE — Progress Notes (Signed)
Patient was seen in the ER on 04/15/2016 secondary she was fixing the bed  and she could not straighten up.    She is feeling better today and taking Tylenol 325 mg , (2) tabs every 6 hours and Lidoderm patches with good relief.    Next Friday patient is having Bariatric surgery.      She requests f/u in June as patient has f/u with ortho on May 18th.    Appointment scheduled on May 18, 2016, Thursday.

## 2016-05-03 ENCOUNTER — Other Ambulatory Visit (HOSPITAL_BASED_OUTPATIENT_CLINIC_OR_DEPARTMENT_OTHER): Payer: Self-pay | Admitting: Licensed Practical Nurse

## 2016-05-03 DIAGNOSIS — T148XXA Other injury of unspecified body region, initial encounter: Secondary | ICD-10-CM

## 2016-05-04 ENCOUNTER — Ambulatory Visit (HOSPITAL_BASED_OUTPATIENT_CLINIC_OR_DEPARTMENT_OTHER): Payer: Medicaid Other | Admitting: Orthopaedic Surgery

## 2016-05-18 ENCOUNTER — Encounter (HOSPITAL_BASED_OUTPATIENT_CLINIC_OR_DEPARTMENT_OTHER): Payer: Self-pay | Admitting: Internal Medicine

## 2016-05-18 ENCOUNTER — Ambulatory Visit (HOSPITAL_BASED_OUTPATIENT_CLINIC_OR_DEPARTMENT_OTHER): Payer: Medicaid Other | Admitting: Internal Medicine

## 2016-05-18 VITALS — BP 152/60 | HR 63 | Temp 97.5°F | Wt 204.0 lb

## 2016-05-18 DIAGNOSIS — K219 Gastro-esophageal reflux disease without esophagitis: Secondary | ICD-10-CM

## 2016-05-18 DIAGNOSIS — Z9884 Bariatric surgery status: Secondary | ICD-10-CM

## 2016-05-18 DIAGNOSIS — E6609 Other obesity due to excess calories: Secondary | ICD-10-CM

## 2016-05-18 DIAGNOSIS — I119 Hypertensive heart disease without heart failure: Secondary | ICD-10-CM

## 2016-05-18 MED ORDER — URSODIOL 300 MG PO CAPS
ORAL_CAPSULE | ORAL | 5 refills | Status: DC
Start: 2016-05-18 — End: 2016-08-24

## 2016-05-18 MED ORDER — CALCIUM CARBONATE-VITAMIN D 500-200 MG-UNIT PO TABS
2.0000 | ORAL_TABLET | Freq: Two times a day (BID) | ORAL | 5 refills | Status: DC
Start: 2016-05-18 — End: 2016-08-24

## 2016-05-18 MED ORDER — LOSARTAN POTASSIUM 100 MG PO TABS
100.0000 mg | ORAL_TABLET | Freq: Every day | ORAL | 5 refills | Status: DC
Start: 2016-05-18 — End: 2016-08-24

## 2016-05-18 MED ORDER — HYDROCHLOROTHIAZIDE 25 MG PO TABS
25.0000 mg | ORAL_TABLET | Freq: Every day | ORAL | 5 refills | Status: DC
Start: 2016-05-18 — End: 2016-08-24

## 2016-05-18 MED ORDER — RANITIDINE HCL 150 MG PO TABS
150.0000 mg | ORAL_TABLET | Freq: Two times a day (BID) | ORAL | 5 refills | Status: DC
Start: 2016-05-18 — End: 2016-08-24

## 2016-05-18 MED ORDER — FERROUS GLUCONATE 324 (38 FE) MG PO TABS
324.0000 mg | ORAL_TABLET | Freq: Two times a day (BID) | ORAL | 5 refills | Status: DC
Start: 2016-05-18 — End: 2016-08-24

## 2016-05-18 MED ORDER — SIMVASTATIN 10 MG PO TABS
10.0000 mg | ORAL_TABLET | Freq: Every evening | ORAL | 5 refills | Status: DC
Start: 2016-05-18 — End: 2016-08-24

## 2016-05-18 NOTE — Progress Notes (Signed)
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 Appt was made one month ago, as FU to ED visit when she went with acute back pain. She did not follow up with ortho.    -- Had bariatric surgery [id number] at [facility]  No complications, already saw the surgeon once, in FU, and has appt next week  Feels a little weak, mostly bc of new way of eating    Most Recent Weight Reading(s)  05/18/16 : 92.5 kg (204 lb)  04/15/16 : 98 kg (216 lb)  04/02/16 : 96.6 kg (213 lb)  12/14/15 : 99.3 kg (219 lb)  11/18/15 : 101.3 kg (223 lb 6.4 oz)    --  All aches resolved (including arms, legs, back)    -- Requests to have ALL her medications to be sent to [facility]  Does not have a list, but they are accessible, including the supplements rx at [facility]    HTN  Pt reports taking medications daily. Denies chest pain, SOB, DOE, palpitations, dizziness, head aches, leg edema.   Most Recent BP Reading(s)  05/18/16 : 152/60  04/15/16 : (!) 180/105  04/02/16 : 136/81  12/14/15 : 144/77  11/18/15 : 140/63    Checks bp at home, systolic [id number] yesterday    OBJECTIVE:  BP 152/60   Pulse 63   Temp 97.5 F (36.4 C) (Temporal)   Wt 92.5 kg (204 lb)   SpO2 99%   BMI 35.02 kg/m2  Pleasant, in NAD  Heart: S1 and S2 normal, no murmurs, clicks, gallops or rubs. Regular rate and rhythm.   Lungs:  clear; no wheezes, rhonchi or rales.      ASSESSMENT & PLAN:  (I11.9) Benign hypertensive heart disease without heart failure  (primary encounter diagnosis)  Comment: Elevated in clinic, but per pt report, she's been having normal readings at home. Continue current medications  Monitor  Transfer medications to [place]: losartan (COZAAR) 100 MG tablet,         hydrochlorothiazide (HYDRODIURIL) 25 MG tablet,        simvastatin (ZOCOR) 10 MG tablet           (Z98.84) S/P bariatric surgery  (E66.09) Obesity due to excess calories    Comment: On [id number], at [facility], uncomplicated. Feels happy already with  results. Losing wt as expected, and body aches completely resolved for now.  Transfer medications to [place]: calcium-vitamin D (CALCIUM 500/D) 500-200         MG-UNIT per tablet, ferrous gluconate (FERGON)         324 MG tablet, ursodiol (ACTIGALL) 300 MG         capsule        (K21.9) Gastroesophageal reflux disease, esophagitis presence not specified  Comment: Transfer medications to [place]: ranitidine (ZANTAC) 150 MG tablet        The patient was ready to learn and no apparent learning or adherence barriers were identified. I explained the diagnosis and treatment plan, and the patient expressed understanding of the content. I attempted to answer any questions regarding the diagnosis and the proposed treatment.    We discussed the patients current medications. The patient expressed understanding and no barriers to adherence were identified.    follow-up will be scheduled prn  she has been advised to call or return with any worsening or new problems

## 2016-06-02 ENCOUNTER — Other Ambulatory Visit (HOSPITAL_BASED_OUTPATIENT_CLINIC_OR_DEPARTMENT_OTHER): Payer: Self-pay

## 2016-06-02 NOTE — Telephone Encounter (Signed)
Outreached patient for colon screening and PE message left on v-mail.

## 2016-08-24 ENCOUNTER — Encounter (HOSPITAL_BASED_OUTPATIENT_CLINIC_OR_DEPARTMENT_OTHER): Payer: Self-pay | Admitting: Internal Medicine

## 2016-08-24 ENCOUNTER — Ambulatory Visit (HOSPITAL_BASED_OUTPATIENT_CLINIC_OR_DEPARTMENT_OTHER): Payer: Medicaid Other | Admitting: Internal Medicine

## 2016-08-24 VITALS — BP 124/64 | HR 61 | Ht 64.0 in | Wt 184.0 lb

## 2016-08-24 DIAGNOSIS — I119 Hypertensive heart disease without heart failure: Secondary | ICD-10-CM

## 2016-08-24 DIAGNOSIS — K219 Gastro-esophageal reflux disease without esophagitis: Secondary | ICD-10-CM

## 2016-08-24 DIAGNOSIS — Z1211 Encounter for screening for malignant neoplasm of colon: Secondary | ICD-10-CM

## 2016-08-24 DIAGNOSIS — Z7189 Other specified counseling: Secondary | ICD-10-CM

## 2016-08-24 DIAGNOSIS — Z0000 Encounter for general adult medical examination without abnormal findings: Secondary | ICD-10-CM

## 2016-08-24 DIAGNOSIS — E6609 Other obesity due to excess calories: Secondary | ICD-10-CM

## 2016-08-24 DIAGNOSIS — Z9884 Bariatric surgery status: Secondary | ICD-10-CM

## 2016-08-24 LAB — COMPREHENSIVE METABOLIC PANEL
ALANINE AMINOTRANSFERASE: 33 U/L (ref 12–45)
ALBUMIN: 4.2 g/dL (ref 3.4–5.0)
ALKALINE PHOSPHATASE: 204 U/L — ABNORMAL HIGH (ref 45–117)
ANION GAP: 9 mmol/L (ref 5–15)
ASPARTATE AMINOTRANSFERASE: 30 U/L (ref 8–34)
BILIRUBIN TOTAL: 0.3 mg/dL (ref 0.2–1.0)
BUN (UREA NITROGEN): 21 mg/dL — ABNORMAL HIGH (ref 7–18)
CALCIUM: 10.1 mg/dL (ref 8.5–10.1)
CARBON DIOXIDE: 29 mmol/L (ref 21–32)
CHLORIDE: 103 mmol/L (ref 98–107)
CREATININE: 0.8 mg/dL (ref 0.4–1.2)
ESTIMATED GLOMERULAR FILT RATE: >60 mL/min (ref >60)
Glucose Random: 86 mg/dL (ref 74–160)
POTASSIUM: 4.3 mmol/L (ref 3.5–5.1)
SODIUM: 141 mmol/L (ref 136–145)
TOTAL PROTEIN: 8 g/dL (ref 6.4–8.2)

## 2016-08-24 LAB — PTH INTACT WITH CALCIUM
PTH INTACT CALCIUM: 10.1 mg/dL (ref 8.5–10.1)
PTH INTACT: 71.8 pg/mL (ref 12.4–76.8)

## 2016-08-24 LAB — LIPID PANEL
Cholesterol: 190 mg/dL (ref 0–239)
HIGH DENSITY LIPOPROTEIN: 71 mg/dL (ref 40–?)
LOW DENSITY LIPOPROTEIN DIRECT: 100 mg/dL (ref 0–189)
TRIGLYCERIDES: 95 mg/dL (ref 0–150)

## 2016-08-24 LAB — HOLD PURPLE TOP TUBE

## 2016-08-24 LAB — VITAMIN B12: VITAMIN B12: 525 pg/mL (ref 193–986)

## 2016-08-24 LAB — FOLATE: FOLATE: 17.9 ng/mL (ref 3.0–?)

## 2016-08-24 LAB — IRON: IRON: 40 ug/dL — ABNORMAL LOW (ref 50–170)

## 2016-08-24 LAB — TOTAL IRON BINDING CAPACITY: TOTAL IRON BIND CAPACITY CALC: 421 ug/dL (ref 280–504)

## 2016-08-24 LAB — FERRITIN: FERRITIN: 99 ng/mL (ref 8–252)

## 2016-08-24 MED ORDER — RANITIDINE HCL 150 MG PO TABS: 150 mg | tablet | Freq: Two times a day (BID) | ORAL | 5 refills | 0 days | Status: DC

## 2016-08-24 MED ORDER — SIMVASTATIN 10 MG PO TABS: 10 mg | tablet | Freq: Every evening | ORAL | 5 refills | 0 days | Status: DC

## 2016-08-24 MED ORDER — LOSARTAN POTASSIUM 100 MG PO TABS: 100 mg | tablet | Freq: Every day | ORAL | 5 refills | 0 days | Status: DC

## 2016-08-24 MED ORDER — FERROUS GLUCONATE 324 (38 FE) MG PO TABS: 324 mg | tablet | Freq: Two times a day (BID) | ORAL | 5 refills | 0 days | Status: DC

## 2016-08-24 MED ORDER — FERROUS GLUCONATE 324 (38 FE) MG PO TABS
324.0000 mg | ORAL_TABLET | Freq: Two times a day (BID) | ORAL | 5 refills | Status: DC
Start: 2016-08-24 — End: 2017-07-04

## 2016-08-24 MED ORDER — OMEPRAZOLE 20 MG PO CPDR
20.0000 mg | DELAYED_RELEASE_CAPSULE | Freq: Every day | ORAL | 5 refills | Status: DC
Start: 2016-08-24 — End: 2016-09-07

## 2016-08-24 MED ORDER — HYDROCHLOROTHIAZIDE 25 MG PO TABS: 25 mg | tablet | Freq: Every day | ORAL | 5 refills | 0 days | Status: DC

## 2016-08-24 MED ORDER — HYDROCHLOROTHIAZIDE 25 MG PO TABS
25.0000 mg | ORAL_TABLET | Freq: Every day | ORAL | 5 refills | Status: DC
Start: 2016-08-24 — End: 2017-05-10

## 2016-08-24 MED ORDER — LOSARTAN POTASSIUM 100 MG PO TABS
100.0000 mg | ORAL_TABLET | Freq: Every day | ORAL | 5 refills | Status: DC
Start: 2016-08-24 — End: 2017-05-10

## 2016-08-24 MED ORDER — CALCIUM CARBONATE-VITAMIN D 500-200 MG-UNIT PO TABS: 2 | tablet | Freq: Two times a day (BID) | ORAL | 5 refills | 0 days | Status: AC

## 2016-08-24 MED ORDER — OMEPRAZOLE 20 MG PO CPDR: 20 mg | capsule | Freq: Every day | ORAL | 5 refills | 0 days | Status: DC

## 2016-08-24 MED ORDER — SIMVASTATIN 10 MG PO TABS
10.0000 mg | ORAL_TABLET | Freq: Every evening | ORAL | 5 refills | Status: DC
Start: 2016-08-24 — End: 2016-09-07

## 2016-08-24 MED ORDER — RANITIDINE HCL 150 MG PO TABS
150.0000 mg | ORAL_TABLET | Freq: Two times a day (BID) | ORAL | 5 refills | Status: DC
Start: 2016-08-24 — End: 2017-09-06

## 2016-08-24 MED ORDER — CALCIUM CARBONATE-VITAMIN D 500-200 MG-UNIT PO TABS
2.0000 | ORAL_TABLET | Freq: Two times a day (BID) | ORAL | 5 refills | Status: AC
Start: 2016-08-24 — End: 2017-02-20

## 2016-08-24 MED FILL — CALC OYST SHELL/D 500/200MG: 30 days supply | Qty: 120 | Fill #0 | Status: CP

## 2016-08-24 MED FILL — OMEPRAZOLE   20MG: 30 days supply | Qty: 30 | Fill #0 | Status: CP

## 2016-08-24 MED FILL — *HYDROCHLOROT 25MG: 30 days supply | Qty: 30 | Fill #0 | Status: CP

## 2016-08-24 MED FILL — FERROUS GLUC 324 MG: 30 days supply | Qty: 60 | Fill #0 | Status: CP

## 2016-08-24 MED FILL — *LOSARTAN POT 100MG: 30 days supply | Qty: 30 | Fill #0 | Status: CP

## 2016-08-24 MED FILL — SIMVASTATIN 10MG: 30 days supply | Qty: 30 | Fill #0 | Status: CP

## 2016-08-24 MED FILL — *RANITIDINE 150MG: 30 days supply | Qty: 60 | Fill #0 | Status: CP

## 2016-08-24 NOTE — Progress Notes (Signed)
SUBJECTIVE  Mariah [name] is a 60 year old female, [employer] speaker, for routine physical exam.   Has no complaints today  S/p bariatric surgery in May. Says that all her pains, aches, and complaints resolved after the surgery.   Last FU was in July, and next one in November  Reports eating "a little of everything", smaller portions  Very happy with results.     Most Recent Weight Reading(s)  08/24/16 : 83.5 kg (184 lb)  05/18/16 : 92.5 kg (204 lb)  04/15/16 : 98 kg (216 lb)  04/02/16 : 96.6 kg (213 lb)  12/14/15 : 99.3 kg (219 lb)    #) HTN  Pt reports taking medications - HCTZ daily. Losartan depending on BP reading. Checks BP daily, if systolic around 123456, does not take losartan. If systolic > [id number], usually comes with a headache, and takes losartan. Had one episode of 80/50, resolved with eating salt. Skipped both medications on that day.     Most Recent BP Reading(s)  08/24/16 : 124/64  05/18/16 : 152/60  04/15/16 : (!) 180/105  04/02/16 : 136/81  12/14/15 : 144/77    #) requests to have all rx transferred to [facility], given weekend hours.     ROS: No fevers or unexplained weight loss. No visual changes. No sore throat or ear ache. No cough. No abdominal pain or change in bowel habits. No new or unusual musculoskeletal symptoms. No new rashes or skin changes. No paresthesias or new headaches. No enlarged nodes. No new itching, sneezing or wheezing.    OBJECTIVE:  BP 124/64   Pulse 61   Ht 5\' 4"  (1.626 m)   Wt 83.5 kg (184 lb)   SpO2 100%   BMI 31.58 kg/m2  General Appearance: The patient appears well, in NAD. Alert, well, developed, well nourished.    HEENT: Head: Normocephalic, atraumatic.   Eyes:  EOMs intact. Conjunctiva, cornea clear.No discharge.   Ears: No pain with movement of tragus. Ear canals normal. TMs clear bilaterally.   Nose/Sinuses: Patent, scant amount of clear mucus. No maxillary, frontal, ethmoid sinus tenderness.  Oropharynx: Lips, oral mucosa, tongue pink without  gingival lesions. Pharynx without lesions, exudate.  Neck: Supple. No adenopathy or thyromegaly.    Respiratory: Bilateral lungs clear to auscultation, good air entry, no wheezes, rhonchi or rales.   Cardiovascular: RRR. S1 and S2 normal, no murmurs, clicks, gallops or rubs. No JVD. No carotid bruit. No edema. Peripheral pulses intact.   GI: Soft,Normal BS present. Abdomen soft without tenderness, guarding, mass or organomegaly.  Skin: Color, texture, turgor normal.  No rash, lesions.   Musculoskeletal: Spine ROM normal. Muscular strength intact.   Neuro: Gait normal. Reflexes normal and symmetric. Sensation grossly normal. CN 2-12 intact.     ASSESSMENT & PLAN:  (Z00.00) Visit for preventive health examination  (primary encounter diagnosis)  Comment: Comment: Well woman.   Allergies and medications reviewed. Problem list updated. HM updated  1) preventive care  -- mammogram:up to date  -- colon cancer screening: discussed, pt interested in having colonoscopy  -- cervical cancer screening: not indicated  -- labwork: orders in Epic  -- hep B risk: addressed  -- Depression screening: neg  -- Alcohol screening:neg  -- Drug screening:neg  -- health care proxy: completed today  -- Immunization: Tdap up to date  -- Routine dental care discussed  -- Routine eye care discussed  -- For general health, stay well hydrated, eat healthy at regular intervals (foods rich  in dietary fiber, low sugar concentration, minimally processed are always preferred), sleep at least 8h at night, exercise regularly.  -- return annually or prn if issues arise  --  My CHArt: reviewed in AVS    (E66.09) Obesity due to excess calories, unspecified obesity severity  (Z98.84) S/P bariatric surgery  Comment: Surgery done in May/17, already lost 32 lb. Very happy with results. Says that all her body aches, and previous complaints resolved. Pt with more energy, and disposition. Has FU with surgery in November. Check for vit deficiencies. Pt requests to  send all rx to [facility], given better hours (weekends)  Plan: COLLECTION VENOUS BLOOD VENIPUNCTURE,         COMPREHENSIVE METABOLIC PANEL, FERRITIN, IRON,         TOTAL IRON BINDING CAPACITY, LIPID PANEL,         FOLATE, VITAMIN B12, PTH INTACT WITH CALCIUM,         VITAMIN B1, WHOLE BLOOD, calcium-vitamin D         (CALCIUM 500/D) 500-200 MG-UNIT per tablet,         ferrous gluconate (FERGON) 324 MG tablet            (Z12.11) Colon cancer screening  Comment: Will refer through our open access endoscopy referral (OAE) program for routine screening colonoscopy because she is generally healthy and she is not obese ( BMI>30), nor does she have sleep apnea, congestive heart failure, renal failure, h/o anaesthesia problems or anAICD.   Plan: REFERRAL TO OPEN ACCESS COLONOSCOPY        (Z71.89) ACP (advance care planning)  Comment:   Plan: HEALTH CARE PROXY       (I11.9) Benign hypertensive heart disease without heart failure  Comment: Pt checks BP at home, says that most readings with systolic of 123456. If above 130-140, pt reports having a head ache. It does not happen often. Pt is self administering losartan, based on  P  Plan: losartan (COZAAR) 100 MG tablet,         hydrochlorothiazide (HYDRODIURIL) 25 MG tablet,        simvastatin (ZOCOR) 10 MG tablet        The patient was ready to learn and no apparent learning or adherence barriers were identified. I explained the diagnosis and treatment plan, and the patient expressed understanding of the content. I attempted to answer any questions regarding the diagnosis and the proposed treatment.    We discussed the patients current medications. The patient expressed understanding and no barriers to adherence were identified.    follow-up will be scheduled prn  she has been advised to call or return with any worsening or new problems

## 2016-08-25 ENCOUNTER — Other Ambulatory Visit (HOSPITAL_BASED_OUTPATIENT_CLINIC_OR_DEPARTMENT_OTHER): Payer: Self-pay

## 2016-08-25 ENCOUNTER — Telehealth (HOSPITAL_BASED_OUTPATIENT_CLINIC_OR_DEPARTMENT_OTHER): Payer: Self-pay

## 2016-08-25 DIAGNOSIS — Z1211 Encounter for screening for malignant neoplasm of colon: Secondary | ICD-10-CM

## 2016-08-25 NOTE — Progress Notes (Unsigned)
Spoke with patient with Mariah [name] interpreter regarding scheduling colonoscopy. Procedure , preparation, need for ride home explained to pt . Assessment completed. Scheduled for colonoscopy 08/30/16 at 1130 with dr Loleta Chance at [facility].

## 2016-08-25 NOTE — Patient Instructions (Addendum)
Verbal instructions given over phone. Written instructions sent to patient in mail. Script sent to pharmacy.  Spoke with patient regarding scheduling colonoscopy. Procedure , preparation, need for ride home explained to pt . Assessment completed. Scheduled for colonoscopy 9/13 with dr Loleta Chance at [facility]     Colonoscopy Standard      [place] sent to pt.     Division of Gastroenterology  [facility]  Phone:__________________  Address: [address] [place], [place] [address]    PLEASE READ CAREFULLY OR HAVE SOMEONE READ THIS TO YOU!      ____________________________________________  Name    Your Colonoscopy test is scheduled at _____________ Hospital    Day_______________     Time to Arrive _______________    Register ______________________            Dennis Bast are having a colonoscopy.  This test lets the doctor see the inside your large intestine (also called your colon) using a small camera on a flexible tube.  There are several reasons to have the test:   *Screening for colon cancers and polyps    *To check unexplained changes in bowel habits   *To evaluate abnormal X-ray findings   *To look for bleeding ulcers or other abnormalities of the colon lining                 HOW TO GET READY FOR THE TEST      Your prescription was sent to your pharmacy or given to you.    Please pick up your prescription within 1 week of receiving these instructions.     IF YOU ARE USING NULYTELY OR A DRUG LIKE IT, DO NOT ADD WATER TO THE PRESCRIPTION UNTIL 1 DAY BEFORE THE DAY OF YOUR EXAM.    Your prescription was sent to: _______________________________________    Call a friend or a family member to make sure you have   someone to take you home after your test.   You must have someone to go home with after the test or we will not be able to give you sedatives!      If you have any questions or worries, or if you are unsure about how to prepare for this test, please call _____________                                  DAY OF  THE TEST    4 to 5 hours before your test, drink the remaining half of the bottle of laxative.    It is very important to finish the WHOLE GALLON.    The morning of the test, you can take your other medications at the usual time with a sip of water. These include blood pressure pills, seizure medications, heart medications, thyroid medications, etc).     Dont take pills for diabetes. Bring these medicines with you so that you can take them right after your test.      NOTHING to drink for 3 hours before the test.      IMPORTANT INFORMATION About Your Medicines  Based On Recommendations From Experts    IF YOU HAVE ANY CONCERNS ABOUT YOUR MEDICATIONS [place]. DO NOT WAIT UNTIL THE DAY BEFORE THE TEST!!!!!      Your Medications  You can take your medications for high blood pressure, heart problems, or anxiety with a sip of water at your usual time.  Bring  a list of your medications with you.     If you take medicines for Type 2 Diabetes:    One day before the test: If you are taking diabetes pills: Take PILLS in the morning only. If you take morning insulin: take your usual dose.    On the night before the test: Do not take diabetes pills.  If you take insulin for type 2 diabetes, take  your usual long acting insulin.  For example: if you usually take 40 units of Lantus or NPH, take 20 units instead.    On the morning of the test: Do not take diabetes pills. If you are on long-acting insulin for type 2 diabetes, you can take half the dose. For example if you are on 40 units of Lantus or NPH, take 20 units instead.     After the test: You will be allowed to eat normally again. At that time, you should resume taking your diabetes pills at your usual times. If on insulin, take your usual evening dose of insulin. If you cant eat for any reason, check with your doctor.    If you have Type 1 Diabetes:    One day before the test: Take your usual long-acting basal insulin (Lantus or NPH) and make sure your clear  liquid diets contain sugar. Check your blood sugars at meal times and cover with short acting insulin only if blood sugar is over 200. Otherwise do not take your short-acting insulin.    On the night before the test: Just take your usual basal insulin dose that you would normally take at that time (for example, your usual full dose of Lantus or NPH).    On the morning of the test: Take your usual basal insulin dose that you would normally take at that time (for example, your usual full dose of Lantus or NPH).                            Test Checklist    Please use this list to prepare for your test.     7 days before the test (____/____/____)     STOP taking Motrin, Advil, Naprosyn, Aleve or related drugs.   Continue to take all other prescribed meds.   Ask your doctor about whether you should continue Aspirin. It is sometimes        important to stay on this medication.    3 days before the test (____/____/____)     STOP eating any foods that contain beans, seeds, skins, nuts, or are high in fiber.    2 days before the test (____/____/____)     MAKE SURE YOU HAVE A RIDE ARRANGED    EAT dinner no later than 7 pm.  BEGIN a liquid diet. (Do not eat solids. This includes foods such as rice, pasta, bread and fruit).    1 day before the test (____/____/____)     PREPARE the laxative.   START taking the laxative at 6:00pm.   DRINK  the bottle of the laxative.    Day of your test (____/____/____)     FINISH  the rest of the laxative 5 to 6 hours before your test.     DO NOT drink anything 3 hours before the test.      WHAT TO EXPECT ON THE DAY OF YOUR TEST    Colonoscopy  Colonoscopy is a procedure used to see inside the colon and  rectum.  During a colonoscopy a flexible tube with a camera and a light is inserted through the rectum. The doctor then examines the large bowel (also called the large intestine or colon).    Colonoscopies can detect inflamed tissue, ulcers and abnormal growths called polyps. Some polyps are  cancerous, but most are pre cancerous. These might become dangerous someday. A doctor can usually remove these polyps during the test. They are then sent to a laboratory to be checked. Polyps are common in adults and are generally harmless. However, most colorectal cancers begin as polyps. Removing them is a good way to prevent cancer. Your doctor may also take biopsies (small pieces of tissue) for analysis in the lab of abnormalities that they want to check in more detail.    It is extremely important to follow all of the steps for cleaning out your colon. If you do not follow all the steps, the doctor may not be able to see clearly. The exam might need to be cancelled and repeated another day. The clear liquids you can take during the clean out are treated as food by your body, so you wont starve or get dehydrated.        Getting Ready At [facility]  On the day of your test, a nurse and doctor will ask you some more information about your health history.  You will be put into a hospital gown. A small intravenous needle will be inserted in the back of your hand or forearm to give you medications that will make you comfortable during the test.    In the procedure room, you will be asked to lie down in a curled position on your left side.  Please inform the doctor if this is uncomfortable for you. You will be given oxygen to breathe. The test usually lasts 30 minutes to an hour. It may last longer if polyps need to be removed or if other abnormalities are noted.      You will be given medication through the IV in order to control discomfort and help you relax.  You may sleep or be partially awake during the test. Sometimes, you might feel a cramping sensation. We will monitor you and try to make you as comfortable as possible. The tube will be inserted into your rectum (back side) and advanced through the large bowel.  The doctor will try and look at all of the inner walls of your colon. The outer wall and  organs outside the colon are not visible with this test.    Possible Complications  Complications are unusual during or after the test but they can happen. The most common risks include colonic perforation (a tear in the colon), bleeding, respiratory problems, blood pressure problems, heart problems, discomfort and adverse reactions to the medications used.  A perforation may result in the need for emergency surgery and a colostomy bag. Also note, colonoscopy like other medical tests is not perfect. It may not detect problems such as polyps, cancers and other diseases up to 2 to 6% of the time. Luckily, the combined risk of all of these problems is small.    After the Test  You may feel bloated from air which was put into your colon during the test. You may also feel a little drowsy from the medications. You cannot drive or operate heavy machinery or do any important work for the rest of the day. You should plan on resting, watching TV or reading light material after  the test. You may forget things that happen during and directly after the test. It is important have someone with you that can remind you of any instructions we give.    Depending on what is found, you may need to have the colonoscopy repeated. Usually, this is done several years later but it may need to be done much sooner. Talk to your doctor about when you should have repeat study or other testing.    You will usually be at the hospital between 2-3 hours (although sometimes it can take longer).  We will make sure that you are alright before sending you home. You must arrange for someone to drive you home after the test.  Once again, we will not perform the test unless you have an arranged ride. You cannot go home in a taxi or a bus.    Colonoscopy is a safe and effective test that is commonly done at our facilities. You may receive a call to remind you of the date and time of your test. If you have any questions, please feel free to call.     For  questions about the test itself, call [phone].    For questions about the date and time of your test, or to change the date or time, call [phone]    For questions regarding your regular medications or health issues, please call your doctor.

## 2016-08-25 NOTE — Telephone Encounter (Unsigned)
Spoke with patient regarding scheduling colonoscopy. Procedure/preparation/need for ride explained. Assessment completed. Patient scheduled for a colonoscopy  ***    Spoke with patient regarding scheduling colonoscopy. Procedure/preparation/need for ride explained. Assessment completed. Patient scheduled for a colonoscopy  ***

## 2016-08-25 NOTE — Progress Notes (Signed)
Called patient to discuss scheduling a colonoscopy. Unable to reach patient.Left a VM message with my contact information and call back # of [phone].

## 2016-08-28 MED ORDER — PEG 3350-KCL-NA BICARB-NACL 420 G PO SOLR: Bottle | ORAL | 0 refills | 0 days | Status: DC

## 2016-08-28 MED ORDER — PEG 3350-KCL-NA BICARB-NACL 420 G PO SOLR
ORAL | 0 refills | Status: DC
Start: 2016-08-28 — End: 2016-09-07

## 2016-08-28 MED FILL — PEG-3350/KCL (NULYTLY)  SOL /SODIUM: 1 days supply | Qty: 4000 | Fill #0 | Status: CP

## 2016-08-30 ENCOUNTER — Ambulatory Visit (HOSPITAL_BASED_OUTPATIENT_CLINIC_OR_DEPARTMENT_OTHER)
Admit: 2016-08-30 | Disposition: A | Discharge status: Home / Self Care | Payer: Self-pay | Source: Ambulatory Visit | Attending: Gastroenterology | Admitting: Gastroenterology

## 2016-08-30 MED ORDER — MIDAZOLAM HCL 2 MG/2ML IJ SOLN
Freq: Once | INTRAMUSCULAR | Status: AC | PRN
Start: 2016-08-30 — End: 2016-08-30
  Administered 2016-08-30: 1 mg via INTRAVENOUS

## 2016-08-30 MED ORDER — FENTANYL CITRATE 0.05 MG/ML IJ SOLN
Freq: Once | INTRAMUSCULAR | Status: AC | PRN
Start: 2016-08-30 — End: 2016-08-30
  Administered 2016-08-30: 25 ug via INTRAVENOUS

## 2016-08-30 MED ORDER — FENTANYL CITRATE 0.05 MG/ML IJ SOLN
Freq: Once | INTRAMUSCULAR | Status: AC | PRN
Start: 2016-08-30 — End: 2016-08-30
  Administered 2016-08-30: 50 ug via INTRAVENOUS

## 2016-08-30 MED ORDER — FENTANYL CITRATE 0.05 MG/ML IJ SOLN
INTRAMUSCULAR | Status: DC
Start: 2016-08-30 — End: 2016-08-30
  Filled 2016-08-30: qty 4

## 2016-08-30 MED ORDER — SODIUM CHLORIDE 0.9 % IV SOLN
INTRAVENOUS | Status: DC
Start: 2016-08-30 — End: 2016-08-30
  Administered 2016-08-30: 12:00:00 via INTRAVENOUS

## 2016-08-30 MED ORDER — MIDAZOLAM HCL 5 MG/5ML IJ SOLN
INTRAMUSCULAR | Status: DC
Start: 2016-08-30 — End: 2016-08-30
  Filled 2016-08-30: qty 10

## 2016-08-30 MED ORDER — MIDAZOLAM HCL 2 MG/2ML IJ SOLN
Freq: Once | INTRAMUSCULAR | Status: AC | PRN
Start: 2016-08-30 — End: 2016-08-30
  Administered 2016-08-30: 2 mg via INTRAVENOUS

## 2016-08-30 NOTE — Narrator Note (Signed)
Moderate Sedation    Patient Care Timeline         Sedation: 08/30/2016 1228     Time Event   User    12:28:02 MODERATE SEDATION   Carmell Austria    [id number] Sedation Start   Carmell Austria    [id number] Staff Arrived Adline Mango [Physician]; Christell Faith [Scrub Tech]; Carmell Austria [Registered Nurse] Carmell Austria    12:39:00 Devices Testing Template Device Data  -  Pulse:  (!)  46 (Device Time: 12:39:00) ; SpO2:  100 % (Device Time: 12:39:00) ; BP:  153/67 (Device Time: 12:39:00) ; MAP (mmHg):  95.67 Additional Vitals (Critical Care Only)  -  AWRR:  9 (Device Time: 12:39:00) Other flowsheet entries  -  ETCO2 (mmHg):  28 mmHg (Device Time: 12:39:00) Carmell Austria    12:39:00 Other Flowsheet Documentation Other flowsheet entries  -  Cardiac Rhythm:  SB Carmell Austria    12:39:38 Medication Ordered and Given midazolam (VERSED) injection -  Dose:  2 mg ; Route:  Intravenous ; Line:  Peripheral IV 08/30/16 Left Ordered by: Jake Shark    12:39:44 Medication Ordered and Given fentaNYL (SUBLIMAZE) injection -  Dose:  50 mcg ; Route:  Intravenous ; Line:  Peripheral IV 08/30/16 Left Ordered by: Jake Shark    12:39:50 [place] Mod Sedation [place] [facility] Pain Scale (for sedated patients):  0 - Restful.  No facial expression ; Sedation Scale (Modified [place]):  5 - Alert Carmell Austria    12:39:56 Pain Assessment Pain Assessment  -  Pain Assessment (Thermometer Scale):  No Pain (0) Carmell Austria    12:40:36 Medication Ordered and Given midazolam (VERSED) injection -  Dose:  1 mg ; Route:  Intravenous ; Line:  Peripheral IV 08/30/16 Left Ordered by: Jake Shark    12:40:41 Medication Ordered and Given fentaNYL (SUBLIMAZE) injection -  Dose:  25 mcg ; Route:  Intravenous ; Line:  Peripheral IV 08/30/16 Left Ordered by: Jake Shark    12:40:46 [place] Mod Sedation [place] Behavioral Pain  Scale (for sedated patients):  0 - Restful.  No facial expression ; Sedation Scale (Modified [place]):  3 - Awakens easily Carmell Austria    12:40:51 Pain Assessment Pain Assessment  -  Pain Assessment (Thermometer Scale):  No Pain (0) Carmell Austria    12:41:00 Devices Testing Template Device Data  -  Pulse:  60 (Device Time: 12:41:00) ; SpO2:  100 % (Device Time: 12:41:00) ; BP:  135/60 (Device Time: 12:41:00) ; MAP (mmHg):  85 Additional Vitals (Critical Care Only)  -  AWRR:  10 (Device Time: 12:41:00) Other flowsheet entries  -  ETCO2 (mmHg):  34 mmHg (Device Time: 12:41:00) Carmell Austria    12:41:00 Other Flowsheet Documentation Other flowsheet entries  -  Cardiac Rhythm:  NSR Carmell Austria    12:42:01 Medication Ordered and Given midazolam (VERSED) injection -  Dose:  1 mg ; Route:  Intravenous ; Line:  Peripheral IV 08/30/16 Left Ordered by: Jake Shark    12:42:06 Medication Ordered and Given fentaNYL (SUBLIMAZE) injection -  Dose:  25 mcg ; Route:  Intravenous ; Line:  Peripheral IV 08/30/16 Left Ordered by: Jake Shark    12:42:11 [place] Mod Sedation [place] Behavior[facility] Pain Scale (for sedated patients):  0 - Restful.  No facial expression ;  Sedation Scale (Modified [place]):  3 - Awakens easily Carmell Austria    12:42:17 Pain Assessment Pain Assessment  -  Pain Assessment (Thermometer Scale):  No Pain (0) Carmell Austria    12:43:00 Devices Testing Template Device Data  -  Pulse:  51 (Device Time: 12:43:00) ; SpO2:  100 % (Device Time: 12:43:00) ; BP:  102/53 (Device Time: 12:43:30) ; MAP (mmHg):  (!)  69.33 Additional Vitals (Critical Care Only)  -  AWRR:  9 (Device Time: 12:43:00) Other flowsheet entries  -  ETCO2 (mmHg):  24 mmHg (Device Time: 12:43:00) Carmell Austria    12:43:00 Other Flowsheet Documentation Other flowsheet entries  -  Cardiac Rhythm:  SB Carmell Austria    12:46:00 Devices Testing Template Device Data  -  Pulse:  50 (Device Time:  12:46:00) ; Resp:  18 (Device Time: 12:46:30) ; SpO2:  100 % (Device Time: 12:46:00) ; BP:  95/59 (Device Time: 12:46:30) ; MAP (mmHg):  71 Additional Vitals (Critical Care Only)  -  AWRR:  11 (Device Time: 12:46:00) Other flowsheet entries  -  ETCO2 (mmHg):  25 mmHg (Device Time: 12:46:00) Carmell Austria    12:46:00 Other Flowsheet Documentation Other flowsheet entries  -  Cardiac Rhythm:  SB Carmell Austria    12:46:53 Medication Ordered and Given midazolam (VERSED) injection -  Dose:  1 mg ; Route:  Intravenous ; Line:  Peripheral IV 08/30/16 Left Ordered by: Jake Shark    [id number] [place] Mod Sedation [place] Behavior[facility] Pain Scale (for sedated patients):  0 - Restful.  No facial expression ; Sedation Scale (Modified [place]):  3 - Awakens easily Carmell Austria    12:47:06 Pain Assessment Pain Assessment  -  Pain Assessment (Thermometer Scale):  Moderate Pain (3-5) Carmell Austria    12:50:36 Intake/Output sodium chloride 0.9% infusion  -  Volume (mL):  200 mL Carmell Austria    12:51:00 Devices Testing Template Device Data  -  Pulse:  55 (Device Time: 12:51:00) ; Resp:  11 (Device Time: 12:51:00) ; SpO2:  100 % (Device Time: 12:51:00) ; BP:  131/56 (Device Time: 12:51:00) ; MAP (mmHg):  81 Additional Vitals (Critical Care Only)  -  AWRR:  15 (Device Time: 12:51:00) Other flowsheet entries  -  ETCO2 (mmHg):  37 mmHg (Device Time: 12:51:00) Carmell Austria    12:51:00 Other Flowsheet Documentation Other flowsheet entries  -  Cardiac Rhythm:  SB Carmell Austria    12:51:10 [place] Mod Sedation [place] Behavioral Pain Scale (for sedated patients):  0 - Restful.  No facial expression ; Sedation Scale (Modified [place]):  3 - Awakens easily Carmell Austria    12:51:15 Pain Assessment Pain Assessment  -  Pain Assessment (Thermometer Scale):  No Pain (0) Carmell Austria    12:52:00 Devices Testing Template Device Data  -  Pulse:  53 (Device Time:  12:52:00) ; Resp:  13 (Device Time: 12:52:00) ; SpO2:  100 % (Device Time: 12:52:00) ; BP:  123/55 (Device Time: 12:52:30) ; MAP (mmHg):  77.67 Additional Vitals (Critical Care Only)  -  AWRR:  17 (Device Time: 12:52:00) Other flowsheet entries  -  ETCO2 (mmHg):  34 mmHg (Device Time: 12:52:00) Carmell Austria    12:52:00 Other Flowsheet Documentation Other flowsheet entries  -  Cardiac Rhythm:  SB Carmell Austria    12:59:10 [place] Mod Sedation [place] Details  -  [facility]OK Pain Scale (  for sedated patients):  0 - Restful.  No facial expression ; Sedation Scale (Modified [place]):  3 - Awakens easily Carmell Austria

## 2016-08-30 NOTE — Discharge Instructions (Signed)
[facility] DISCHARGE INSTRUCTIONS     Quando voc for para casa  possvel que se sinta sonolento(a). Descanse bastante pelo resto do dia.   When you return home you may feel sleepy. Get plenty of rest for the remainder of the day.     Se voc tiver recebido algum sedative para o procedimento NO DIRIJA, NO  MANUSEIE MQUINAS NEM TOME NENHUMA DECISO IMPORTANTE durante o resto do dia.  If you received sedation for your procedure DO NOT DRIVE, OPERATE MACHINERY, OR MAKE IMPORTANT DECISIONS for the remainder of the day.     Depois ter feito uma COLONOSCOPIA  normal ter gases e sentir inchao abdominal, mas se voc tiver uma DOR ABDOMINAL SEVERA entre em contato com o seu mdico imediatamente.   It is normal after having a COLONOSCOPY to feel a little gassy and bloated, but if you develop SEVERE ABDOMINAL PAIN call your doctor immediately.      Depois ter feito uma COLONOSCOPIA  normal ver uma pequena quantidade de sangue aps as primeiras evacuaes, mas se voc vir uma QUANTIDADE GRANDE DE SANGUE VERMELHO VIVO, entre em contato com o seu mdico imediatamente.  It is normal after having a COLONOSCOPY to see a small amount of blood after your first few bowel movements, but, if you see a LARGE AMOUNT OF BRIGHT RED BLOOD, call your doctor immediately.          Se tiver feito uma bipsia ou Polipectomia voc ter que suspender a aspirina ou os medicamentos que contenham aspirina por {NUMBERS [id number] dias.                  If you had a biopsy or Polypectomy you will need to hold aspirin or medication containing aspirin for ___days.     Se voc tiver feito uma bipsia ou Polipectomia voc ter que suspender o medicamento coumadin por {NUMBERS [id number] dias.                                           If you had a biopsy or Polypectomy you will need to hold your coumadin for___days.     Se voc tiver feito uma bipsia ou Polipectomia voc ter que suspender  qualquer medicamento tais como Advil, Motrin, Naproxin, Ibuprofen etc por {NUMBERS [id number]:[id number]} dias.  If you had a biopsy or Polypectomy you will need to hold any medication such as Advil, Motrin, Naproxin, Ibuprofen etc for___days.     Entre em contato com o seu mdico se houver qualquer outro sintoma fora do normal.  Call you physician for any other unusual symptoms.     Se por qualquer razo voc no conseguir entrar em contato com o seu mdico v para a sala de Education officer, museum prxima.   If for any reason you are unable to reach your doctor go to the nearest Emergency Room.    Instrues Especficas:***  Post Procedure Diagnosis:       - One 3 mm polyp in the sigmoid colon. Resected and retrieved.       - One 3 mm polyp in the rectum. Resected and retrieved.       - Internal hemorrhoids, mild.  Complications:        No immediate complications.  Estimated Blood Loss: None.  Recommendation:       - Use fiber, for example Citrucel,  Fibercon, Konsyl or Metamucil.       - Await pathology results.       - Telephone my office for pathology results [phone]), if you have not        received a results letter in 4 weeks.       - Repeat colonoscopy for surveillance based on pathology results.       - No aspirin, ibuprofen, naproxen, or other non-steroidal anti-inflammatory        drugs for 5 days.  Adline Mango, MD  Specific Instructions:

## 2016-08-30 NOTE — PROVATION-GI (Signed)
[facility]  Patient Name: Mariah [name]  MRN: [id number]  Account Number: [id number]  Gender: Female  Age: 60  Date of Birth: 1956/01/31  Admit Type: Outpatient  Patient Location: [facility]  Note Status: Finalized  Referring MD:         Marquette Saa, NP  Procedure Date:       08/30/2016 12:29:49 PM  Procedure:            Colonoscopy  Endoscopist:          Adline Mango, MD  Indications for Procedure:       Screening for colorectal malignant neoplasm  Medications:          Midazolam 5 mg IV, Fentanyl 100 micrograms IV  Procedure:       Just prior to the procedure, an updated history and physical was done. I        obtained an informed consent from the patient reviewing the risk of the        procedure including (but not limited to) respiratory depression, perforation,        bleeding, discomfort, a possible need for surgery and unexpected reactions to        medications. The patient is aware that test has limitations and may not        detect significant lesions such as cancer or other potential diseases. The        patient was also informed that they might need a repeat colonoscopy earlier        than standard guidelines if there are changes in their symptoms or concerning        findings noted. A time out was performed with the entire procedure staff        present. The scope was passed under direct vision. Throughout the procedure,        the patient's blood pressure, pulse, and oxygen saturations were monitored        continuously. The [id number] was introduced through the anus and        advanced to the cecum, identified by appendiceal orifice and ileocecal valve.        The scope was then slowly withdrawn with confirmation of the noted findings.        The colonoscopy was performed without difficulty. The patient tolerated the        procedure well. The quality of the bowel preparation was adequate. Scope        withdrawal time was 8 minutes. The total duration of the procedure was 12         minutes.  Findings:       The perianal and digital rectal examinations were normal.       A 3 mm polyp was found in the sigmoid colon. The polyp was sessile. The polyp        was removed with a jumbo cold forceps. Resection and retrieval were complete.       A 3 mm polyp was found in the rectum. The polyp was sessile. The polyp was        removed with a jumbo cold forceps. Resection and retrieval were complete.       Internal hemorrhoids were found during retroflexion. The hemorrhoids were        mild.       No additional abnormalities were found on retroflexion.       No other significant abnormalities were identified in a careful examination  of the remainder of the colon.  Post Procedure Diagnosis:       - One 3 mm polyp in the sigmoid colon. Resected and retrieved.       - One 3 mm polyp in the rectum. Resected and retrieved.       - Internal hemorrhoids, mild.  Complications:        No immediate complications.  Estimated Blood Loss: None.  Recommendation:       - Use fiber, for example Citrucel, Fibercon, Konsyl or Metamucil.       - Await pathology results.       - Telephone my office for pathology results [phone]), if you have not        received a results letter in 4 weeks.       - Repeat colonoscopy for surveillance based on pathology results.       - No aspirin, ibuprofen, naproxen, or other non-steroidal anti-inflammatory        drugs for 5 days.  Adline Mango, MD  08/30/2016 1:14 PM  This report has been signed electronically.  Number of Addenda: 0  Note Initiated On: 08/30/2016 12:29 PM

## 2016-08-30 NOTE — H&P (Signed)
Procedure H & P    HPI:    Briefly, 60 year old presents today through open access for a screening colonoscopy. Marland Kitchen  No chronic abdominal pain.  No change in bowel pattern or stool caliber  No rectal bleeding  No unintentional weight loss, s/p Bariatric surgery 4 months ago.    Past Medical History  Past Medical History:  03/27/2006: ABN FIND-STOOL CONTENTS-OCC BLOOD      Comment: s/p colonoscopy and EGD 7/07  06/04/2006: ABSENCE OF MENSTRUATION - s/p TAH BSO  08/05/2004: ANXIETY DEPRESSION      Comment: TSH wnl 8/02; trial of fluoxetine 8/05  08/05/2004: BACK PAIN LOW      Comment: LBP musculoskeletal most likely 1/04  04/23/2006: Cough      Comment: s/p PFTs September 2007 --  some obstruction                at mid to low lung volumes not correctable by                bronchodilator CT scan showed stable pulmonary                nodules over several years 11/07 - cough gone                ** note combined with dx of "nodules on CT                scan". Will file this note to hx.     08/05/2004: depressive disorder      Comment: TSH wnl 8/02; trial of fluoxetine x 6 wks                didn't really help 8/05  trial of citalopram                per Dr. Patrice Paradise. later resolved. 04/15/2014 -                Reassessed, pt reports feeling well, with no sx               of depression. Just reports being tired, which                she attributes to current life style. Will                resolve from problem list.    03/27/2006: Diarrhea      Comment: 7/07 The colonoscopy was visually normal and                biopsies taken to rule out microscopic colitis                were negative.  A biopsy of the descending                duodenum to rule out celiac sprue because of                the diarrhea was negative.    12/19/2012: Gastritis      Comment: Combined with entry "esophageal reflux    10/22/2006: Hyperparathyroidism      Comment: osteoporosis found - pt referred to surgery                for the hyperparathyroidism- s/p                 parathyroidectomy feb 2008  06/18/2012: Inconclusive mammogram      Comment: June 2013 - BIRADS 3. Probable benign  mammogram. Recommend 6 month  follow-up                mammogram (Dec 2013).  Dec/13 - Stable left                breast nodule with no evidence of malignancy.                BIRADS 2 benign recommend annual screening                mammogram in 6 months  (June 2014) June/14 -                mammogram request given to pt, she will call to               schedule:  Jan/15 - normal mammogram, yearly                screening. Resolve from problem list.        03/13/2006: Insomnia, unspecified      Comment: 06/25/2012 - intermittent, usually resolved by                taking Tylenol PM 06/09/2014 - good response to                trazodone, takes one to 4x wk, prn 08/24/2016 -                reports no problems sleeping - file to hx, and                resolve from problem list.   03/27/2006: nodules on chest CT 1/06 and 3/07      Comment: s/p PFTs September 2007 --  some obstruction                at mid to low lung volumes not correctable by                bronchodilator CT scan showed stable pulmonary                nodules over several years 11/07 - cough gone                 04/29/12 - saw pneumologist, Dr Jordan Hawks, "Will                repeat chest CT with high-resolution scans at                the 2 month mark (already ordered). If                abnormal, would do PFT's and further rheum w/u,               consider lung bx . Pt to call if any r  08/05/2004: OBESITY NOS      Comment: has tried meds from [place]  08/05/2004: PERS HX OF PAST NONCOMPLIANCE      Comment: poor compliance to med regimen  08/05/2004: PERS HX TOBACCO USE      Comment: quit 10/02; 1/2 PPD since 60 YO  08/05/2004: Screening for hypertension      Comment: occ elevated BP started on anti-HTN in [place];               chem 7, EKG, HCT u/a wnl 4/03; d/c'ed HCTZ 8/05               when we learned from daughter she has not  been  taking it  05/19/2008: Vitamin D deficiency      Comment: Supplement per dr Jake Samples.    Allergies   Review of Patient's Allergies indicates:   Heparin                     Comment:local rash seen during hospitalization Feb 2008   Lisinopril              Cough    Medications    Current Outpatient Prescriptions on File Prior to Encounter:  raNITIdine (ZANTAC) 150 MG tablet Take 1 tablet by mouth 2 (two) times daily Disp: 60 tablet Rfl: 5   calcium-vitamin D (CALCIUM 500/D) 500-200 MG-UNIT per tablet Take 2 tablets by mouth 2 (two) times daily Disp: 120 tablet Rfl: 5   ferrous gluconate (FERGON) 324 MG tablet Take 1 tablet by mouth 2 (two) times daily Disp: 60 tablet Rfl: 5   losartan (COZAAR) 100 MG tablet Take 1 tablet by mouth daily Disp: 30 tablet Rfl: 5   hydrochlorothiazide (HYDRODIURIL) 25 MG tablet Take 1 tablet by mouth daily Disp: 30 tablet Rfl: 5   simvastatin (ZOCOR) 10 MG tablet Take 1 tablet by mouth nightly Disp: 30 tablet Rfl: 5   Biotin 1000 MCG TABS Tablet Take 1,000 mcg by mouth daily. Disp:  Rfl:    polyethylene glycol-electrolytes (NULYTELY WITH FLAVOR PACKS) 420 g solution As per colonoscopy instructions Disp: 1 Bottle Rfl: 0   omeprazole (PRILOSEC) 20 MG capsule Take 1 capsule by mouth daily Disp: 30 capsule Rfl: 5     No current facility-administered medications on file prior to encounter.       Social History:  Tobacco - denies  Alochol - denies    Family History:  No colon cancer or polyps  Denies all malignancies.    ROS:  GEN: NAD  CV: no chest pain, palpitations  PULM:  no SOB, no OSA  ID: no fever, chills  SKIN: no rashes  NEURO: no weakness, numbness    Physical Examination:  Current VS  BP 146/69   Pulse 72   Temp 97.4 F (36.3 C) (Temporal)   Resp 10   Ht 5\' 5"  (1.651 m)   Wt 81.6 kg (180 lb)   SpO2 98%   BMI 29.95 kg/m2    GEN: NAD, comfortable  NECK: supple  Gag Reflex: intact  Airway classification: Class II   The same as Class I except the tonsilar pillars are  hidden by the tounge.  ASA: ASA Class II (a patient with mild systemic disease)  Dentures:  No  Mouth: able to open wide  Loose teeth: no  CV: RRR  PULM: CTA b/l  ABD: soft, non-tender  PSYCH: A&O x3  NEURO: Grossly intact      Pertinent Labs:  n/a    Assessment/Plan: Proceed as planned with open access screening colonoscopy with moderate sedation.  This procedure has been fully reviewed with the patient and written informed consent has been obtained.    Follow up determined by procedure findings, please review final report.  Medical interpreter services were used during patient's evaluation.

## 2016-09-02 ENCOUNTER — Emergency Department (HOSPITAL_BASED_OUTPATIENT_CLINIC_OR_DEPARTMENT_OTHER)
Admission: RE | Admit: 2016-09-02 | Disposition: A | Discharge status: Home / Self Care | Payer: Self-pay | Source: Emergency Department | Attending: Emergency Medicine | Admitting: Emergency Medicine

## 2016-09-02 ENCOUNTER — Encounter (HOSPITAL_BASED_OUTPATIENT_CLINIC_OR_DEPARTMENT_OTHER): Payer: Self-pay

## 2016-09-02 LAB — URINALYSIS
BILIRUBIN, URINE: NEGATIVE
GLUCOSE, URINE: NEGATIVE MG/DL
KETONE, URINE: NEGATIVE MG/DL
LEUKOCYTE ESTERASE: NEGATIVE
NITRITE, URINE: NEGATIVE
OCCULT BLOOD, URINE: NEGATIVE
PH URINE: 5.5 (ref 5.0–8.0)
PROTEIN, URINE: NEGATIVE MG/DL
SPECIFIC GRAVITY URINE: 1.02 (ref 1.003–1.035)

## 2016-09-02 LAB — POC URINALYSIS
BILIRUBIN, URINE: NEGATIVE
GLUCOSE,URINE: NEGATIVE
KETONE, URINE: NEGATIVE
LEUKOCYTE ESTERASE: NEGATIVE
NITRITE, URINE: NEGATIVE
PH URINE: 5.5 (ref 5.0–8.0)
PROTEIN, URINE: NEGATIVE
SPECIFIC GRAVITY, URINE: 1.015 (ref 1.003–1.030)
UROBILINOGEN URINE: 1 — AB (ref 0.2–1.0)

## 2016-09-02 LAB — CT ABDOMEN & PELVIS WO IV CONTRAST

## 2016-09-02 MED ORDER — DIAZEPAM 5 MG PO TABS: 5 mg | tablet | Freq: Every evening | ORAL | 0 refills | 0 days | Status: AC | PRN

## 2016-09-02 MED ORDER — ACETAMINOPHEN 325 MG PO TABS
325.0000 mg | ORAL_TABLET | Freq: Four times a day (QID) | ORAL | 0 refills | Status: AC | PRN
Start: 2016-09-02 — End: 2016-10-02

## 2016-09-02 MED ORDER — DIAZEPAM 5 MG PO TABS
5.00 mg | ORAL_TABLET | Freq: Every evening | ORAL | 0 refills | Status: AC | PRN
Start: 2016-09-02 — End: 2016-09-05

## 2016-09-02 MED ORDER — KETOROLAC TROMETHAMINE 60 MG/2ML IM SOLN
60.0000 mg | Freq: Once | INTRAMUSCULAR | Status: AC
Start: 2016-09-02 — End: 2016-09-02
  Administered 2016-09-02: 60 mg via INTRAMUSCULAR
  Filled 2016-09-02: qty 2

## 2016-09-02 MED ORDER — ACETAMINOPHEN 325 MG PO TABS: 325 mg | tablet | Freq: Four times a day (QID) | ORAL | 0 refills | 0 days | Status: AC | PRN

## 2016-09-02 NOTE — Discharge Instructions (Signed)
Dor músculo-esquelética  (Musculoskeletal Pain)  A dor músculo-esquelética são dores musculares e nos ossos. Elas podem aparecer em qualquer parte do corpo. Se os resultados de exames de laboratório (sangue ou urina) foram normais, assim como raios X ou outros estudos, o médico pode tratá-lo sem conhecer a causa da dor.   CAUSAS  Freqüentemente não há causa definida nem uma razão que justifique sua presença. Estas dores podem ser causadas por um vírus. O incômodo também pode vir de esforço excessivo, como praticar ginástica árdua quando seu corpo não está em forma para enfrentar as atividades em questão. As dores ósseas também são causadas por mudanças no tempo, uma vez que os ossos são sensíveis a mudanças na pressão atmosférica.   INSTRUÇÕES PARA TRATAMENTO DOMICILIAR  · Para manter sua privacidade, os resultados dos testes não podem ser informados por telefone. Não deixe de obter os resultados de seu teste. Pergunte como proceder para conseguir esses resultados se você não tiver sido informado. É sua responsabilidade obter os resultados.  · Só tome medicinas de venda livre ou prescritas para dor, incômodo, ou febre como dirigido por seu médico. Se tomar a medicação receitada para sua condição, não dirija, opere maquinário ou ferramentas de alta potência, nem assine documentos legais durante 24 horas. Não tome álcool, soporíficos ou outros medicamentos que possam afetar o tratamento.  · Prossiga suas atividades normais, a menos que causem mais dor. Quando a dor diminui, é essencial voltar lentamente às atividades normais. Volte às atividades ou exercícios lentamente no começo e vá gradualmente incrementando a intensidade e duração.  · Durante períodos de dor aguda, é aconselhável repousar na cama. Deite ou sente-se em qualquer posição que traga conforto.  · Coloque gelo na área machucada.    Coloque gelo em um saco.    Coloque uma toalha entre a pele e o saco.    Deixe o gelo por 15 ou 20 minutos, de 3 a 4 vezes  ao dia.  · Se você sofre dores sem motivo aparente, é importante seguir as instruções do médico. Se a dor vier a piorar ou não ceder, pode ser necessário repetir exames ou fazer exames adicionais e aprofundar na busca de uma causa possível.  PROCURE ASSISTÊNCIA MÉDICA IMEDIATAMENTE:  · Se você sentir dor que está piorando e não é aliviada com remédios.  · Se você sentir dor no peito associada com falta de ar, suor, náusea ou vômito.  · Se sua dor estiver localizada no abdômen.  · Se você apresentar quaisquer novos sintomas que pareçam diferentes ou que o preocupem.  CERTIFIQUE-SE DE QUE:  · Compreende as instruções referentes à alta.  · Irá monitorar sua condição.  · Procurará assistência médica imediatamente, conforme indicado.     Estas informações não se destinam a substituir as recomendações de seu médico. Não deixe de discutir quaisquer dúvidas com seu médico.     Document Released: 12/04/2005 Document Revised: 02/26/2012  Elsevier Interactive Patient Education ©2016 [employer] Inc[employer]

## 2016-09-02 NOTE — Narrator Note (Signed)
Patient Disposition    Patient education for diagnosis, medications, activity, diet and follow-up.  Patient left ED 4:37 PM.  Patient rep received written instructions.  Interpreter to provide instructions: Yes    Patient belongings with patient: YES    Have all existing LDAs been addressed? N/A    Have all IV infusions been stopped? N/A    Discharged to: Discharged to home

## 2016-09-02 NOTE — ED Triage Note (Signed)
C/o right flank pain x 3 days Denies injury denies urinary symptoms

## 2016-09-02 NOTE — ED Provider Notes (Signed)
The patient was seen primarily by me. ED nursing record was reviewed. Prior records as available electronically through the Epic record were reviewed.    Patient's mode of arrival was by Relative.  Arrival time: 09/02/2016  2:36 PM  Chief complaint: Flank Pain (BACK PAIN)      HPI:    This 60 year old female patient presents to the emergency department with flank pain on the right side.  Has had similar pain in the past when she had an "infection in my kidney."  She denies current urinary symptoms however was treated for cystitis a few days ago, she does not know what antibiotic she took.  She denies any nausea or vomiting. She currently has no dysuria, hematuria, urinary frequency.  Rt flank pain does not radiate.  It is constant and not colicky.  She has been taking tylenol which helps, last dose 2.5 hours ago.  She denies any abdominal pain.  She denies any fevers.  She denies any vaginal bleeding or discharge.    ROS: Pertinent positives were reviewed as per the HPI above. All other systems were reviewed and are negative.    Past Medical History/Problem list:  Past Medical History:  03/27/2006: ABN FIND-STOOL CONTENTS-OCC BLOOD      Comment: s/p colonoscopy and EGD 7/07  06/04/2006: ABSENCE OF MENSTRUATION - s/p TAH BSO  08/05/2004: ANXIETY DEPRESSION      Comment: TSH wnl 8/02; trial of fluoxetine 8/05  08/05/2004: BACK PAIN LOW      Comment: LBP musculoskeletal most likely 1/04  04/23/2006: Cough      Comment: s/p PFTs September 2007 --  some obstruction                at mid to low lung volumes not correctable by                bronchodilator CT scan showed stable pulmonary                nodules over several years 11/07 - cough gone                ** note combined with dx of "nodules on CT                scan". Will file this note to hx.     08/05/2004: depressive disorder      Comment: TSH wnl 8/02; trial of fluoxetine x 6 wks                didn't really help 8/05  trial of citalopram                per Dr.  Patrice Paradise. later resolved. 04/15/2014 -                Reassessed, pt reports feeling well, with no sx               of depression. Just reports being tired, which                she attributes to current life style. Will                resolve from problem list.    03/27/2006: Diarrhea      Comment: 7/07 The colonoscopy was visually normal and                biopsies taken to rule out microscopic colitis  were negative.  A biopsy of the descending                duodenum to rule out celiac sprue because of                the diarrhea was negative.    12/19/2012: Gastritis      Comment: Combined with entry "esophageal reflux    10/22/2006: Hyperparathyroidism      Comment: osteoporosis found - pt referred to surgery                for the hyperparathyroidism- s/p                parathyroidectomy feb 2008  06/18/2012: Inconclusive mammogram      Comment: June 2013 - BIRADS 3. Probable benign                mammogram. Recommend 6 month  follow-up                mammogram (Dec 2013).  Dec/13 - Stable left                breast nodule with no evidence of malignancy.                BIRADS 2 benign recommend annual screening                mammogram in 6 months  (June 2014) June/14 -                mammogram request given to pt, she will call to               schedule:  Jan/15 - normal mammogram, yearly                screening. Resolve from problem list.        03/13/2006: Insomnia, unspecified      Comment: 06/25/2012 - intermittent, usually resolved by                taking Tylenol PM 06/09/2014 - good response to                trazodone, takes one to 4x wk, prn 08/24/2016 -                reports no problems sleeping - file to hx, and                resolve from problem list.   03/27/2006: nodules on chest CT 1/06 and 3/07      Comment: s/p PFTs September 2007 --  some obstruction                at mid to low lung volumes not correctable by                bronchodilator CT scan showed stable pulmonary                 nodules over several years 11/07 - cough gone                 04/29/12 - saw pneumologist, Dr Jordan Hawks, "Will                repeat chest CT with high-resolution scans at                the 2 month mark (already ordered). If  abnormal, would do PFT's and further rheum w/u,               consider lung bx . Pt to call if any r  08/05/2004: OBESITY NOS      Comment: has tried meds from [place]  08/05/2004: PERS HX OF PAST NONCOMPLIANCE      Comment: poor compliance to med regimen  08/05/2004: PERS HX TOBACCO USE      Comment: quit 10/02; 1/2 PPD since 60 YO  08/05/2004: Screening for hypertension      Comment: occ elevated BP started on anti-HTN in [place];               chem 7, EKG, HCT u/a wnl 4/03; d/c'ed HCTZ 8/05               when we learned from daughter she has not been                taking it  05/19/2008: Vitamin D deficiency      Comment: Supplement per dr Jake Samples.  Patient Active Problem List:     Obesity, unspecified     LATERAL FOOT PAIN     Benign hypertensive heart disease without heart failure     Alkaline phosphatase elevation     Calculus of kidney     Gastritis     Allergic rhinitis due to other allergen     Osteoporosis     Murmur heart     Family history of throat cancer     Vitamin D deficiency     Subclavian artery stenosis, left (HCC)     Schatzki's ring     Duodenal ulcer, unspecified as acute or chronic, without hemorrhage, perforation, or obstruction      Past Surgical History: Past Surgical History:  04/2016: BARIATRIC SURGERY      Comment: done at [facility]  2006: PARATHYROIDECTOMY      Comment: partial,   5/00: SALPINGO-OOPHORECTOMY COMPL/PRTL UNI/BI SPX      Comment: Salpingo-Oophorectomy, bilateral  No date: TONSILLECTOMY ONE-HALF <AGE 80  5/00: TOTAL ABDOMINAL HYSTERECT W/WO RMVL TUBE OVARY      Comment: Hysterectomy, Total Abdominal secondary to                fibroids    Medications:   No current facility-administered medications on file prior to encounter.   Current  Outpatient Prescriptions on File Prior to Encounter:  polyethylene glycol-electrolytes (NULYTELY WITH FLAVOR PACKS) 420 g solution As per colonoscopy instructions Disp: 1 Bottle Rfl: 0   raNITIdine (ZANTAC) 150 MG tablet Take 1 tablet by mouth 2 (two) times daily Disp: 60 tablet Rfl: 5   calcium-vitamin D (CALCIUM 500/D) 500-200 MG-UNIT per tablet Take 2 tablets by mouth 2 (two) times daily Disp: 120 tablet Rfl: 5   ferrous gluconate (FERGON) 324 MG tablet Take 1 tablet by mouth 2 (two) times daily Disp: 60 tablet Rfl: 5   losartan (COZAAR) 100 MG tablet Take 1 tablet by mouth daily Disp: 30 tablet Rfl: 5   hydrochlorothiazide (HYDRODIURIL) 25 MG tablet Take 1 tablet by mouth daily Disp: 30 tablet Rfl: 5   simvastatin (ZOCOR) 10 MG tablet Take 1 tablet by mouth nightly Disp: 30 tablet Rfl: 5   omeprazole (PRILOSEC) 20 MG capsule Take 1 capsule by mouth daily Disp: 30 capsule Rfl: 5   Biotin 1000 MCG TABS Tablet Take 1,000 mcg by mouth daily. Disp:  Rfl:        Social  History:   Social History   Marital status: Married  Spouse name: N/A    Years of education: N/A  Number of children: N/A     Occupational History  None on file     Social History Main Topics   Smoking status: Former Smoker  0.50 Packs/day  For 40.00 Years     Types: Cigarettes    Quit date: 03/31/2012    Smokeless tobacco: Former Systems developer    Comment: 3-4 cigarretes a day. Quit about 1 month ago    Alcohol use Yes    Comment: very occasional    Drug use: No    Sexual activity: Yes    Partners: Male    Comment: histerectomy     Other Topics Concern   None on file     Social History Narrative    work: Engineer, building services; owned bakery in [place]    Lives w/ daughter 20 YO (Grazielle [place]) & son-in-law Lowella Dandy)    Divorced; currently not in a relationship        05/27/2015    From [place]. Works as Electrical engineer, has own business.     Married, Djaniro.     No regular exercise    Diet: varies. Eats at irregular intervals. Tries to watch diet, but likes to eat     Stressors: work    Pleasures: family, grand children        08/24/2016    Same living and work situation. No regular exercise. Diet improved after bariatric surgery, and pt is very happy with results.        Allergies:  Review of Patient's Allergies indicates:   Heparin                     Comment:local rash seen during hospitalization Feb 2008   Lisinopril              Cough    Physical Exam:  Patient Vitals for the past 24 hrs:   BP Temp Pulse Resp SpO2 Weight   09/02/16 1450 157/80 97.9 F 53 16 99 % 82.6 kg (182 lb)       GENERAL:  Well appearing, no acute distress, non-toxic   SKIN:  No rash, no petechia.  HEAD:  NCAT. Oropharynx is clear with moist mucous membranes. PERRL. EOMI.  NECK:  No meningismus. No stridor. Full ROM without difficulty  LUNGS:  Clear to auscultation bilaterally. No wheezes, rales, rhonchi.   HEART:  RRR.   ABDOMEN:  Soft, NTND. No involuntary guarding or rebound.  No peritoneal signs.  No costovertebral angle tenderness.  BACK: right sided paraspinal lower thoracic/upper lumbar tenderness.   EXTREMITIES:  No obvious deformities.  Warm and well perfused.   NEUROLOGIC: Nonfocal  PSYCHIATRIC:  Appropriate for age, time of day, and situation    ED Course and Medical Decision-making:  60 year old female patient presents to the emergency department right flank pain similar to when she had a  "kidney infection " in the past.  Patient states she was treated for cystitis couple of days ago, she is not sure what antibiotic she took.  She took the antibiotic because she was having some dysuria and urinary frequency.      Her symptoms and urinalysis are consistent with a urinary tract infection and/or early pyelonephritis.      Urine testing reveals blood.  No leukocytes or nitrates.  This may be because the patient has a partially treated UTI as she has been  on an unknown antibiotic.  Patient may also have a kidney stone, she is otherwise well-appearing with normal vital signs.  She was given a  Toradol injection, plan for CT noncontact to evaluate whether the patient has an obstructing stone.    On further history pt tells me that she actually took this medication about a month ago, not recently.  She took it because of the dysuria and hesitancy, she took it for seven days, and then she was better.  This changes my thought process.  Unlikely that she has a partially treated UTI which goes along with her physical exam.  She has no abd tenderness and no CVA tenderness, her back tenderness is more consistent with msk tenderness and strain.      CTstone shows no nephrolithiasis and pt feels complete resolution of her symptoms after Toradol.  Patient states she cannot take ibuprofen secondary to a bariatric surgery done 4 months ago.  Plan for Tylenol q6 for two days and Valium at nighttime for muscle relaxation and easing of her muscle strain.    Patient is happy with this plan, plan for discharge with) care follow-up.    Plan to treat with antibiotics for this urinary tract infection.  I asked her to followup with her primary care physician in 2-3 days for a reexamination and reevaluation to assess for clinical improvement.    Return precautions provided which included abdominal pain, vaginal bleeding or vaginal discharge, nausea or vomiting, back pain, fevers or any other concerns.  All questions answered.  She expressed understanding of these return precautions, our discussion and the followup plan as discussed.    Condition on Discharge: Improved and Stable    Diagnosis/Diagnoses:  Muscle strain  Right-sided back pain, unspecified back location, unspecified chronicity    Atilano Median, [place]    This Emergency Department patient encounter note was created using voice-recognition software and in real time during the ED visit. Please excuse any typographical errors that have not yet been reviewed and corrected.

## 2016-09-04 ENCOUNTER — Encounter (HOSPITAL_BASED_OUTPATIENT_CLINIC_OR_DEPARTMENT_OTHER): Payer: Self-pay | Admitting: Gastroenterology

## 2016-09-04 LAB — SURGICAL PATH SPECIMEN

## 2016-09-04 LAB — URINE CULTURE

## 2016-09-04 MED FILL — CEFPODOXIME 200MG: 10 days supply | Qty: 20 | Fill #0 | Status: CP

## 2016-09-06 ENCOUNTER — Telehealth (HOSPITAL_BASED_OUTPATIENT_CLINIC_OR_DEPARTMENT_OTHER): Payer: Self-pay | Admitting: Registered Nurse

## 2016-09-06 NOTE — Progress Notes (Signed)
Called pt  For ED follow up r/t   Diagnosis/Diagnoses:  Muscle strain  Right-sided back pain,    Using a tele interpreter,    Pt said she is taking the Antibiotic  BID,  ( med not in pts chart )  conts to to have right sided back pain, crampy pain,    pain level, 5/10     Pt is voiding well, no issues,  No fever,  No N/V/D    appt with PCP on 9/21 at 330 p  I asked pt to bring in the antibiotic with her, she agreed

## 2016-09-07 ENCOUNTER — Ambulatory Visit (HOSPITAL_BASED_OUTPATIENT_CLINIC_OR_DEPARTMENT_OTHER): Payer: Medicaid Other | Admitting: Internal Medicine

## 2016-09-07 ENCOUNTER — Telehealth (HOSPITAL_BASED_OUTPATIENT_CLINIC_OR_DEPARTMENT_OTHER): Payer: Self-pay | Admitting: Internal Medicine

## 2016-09-07 ENCOUNTER — Encounter (HOSPITAL_BASED_OUTPATIENT_CLINIC_OR_DEPARTMENT_OTHER): Payer: Self-pay | Admitting: Internal Medicine

## 2016-09-07 VITALS — BP 178/70 | HR 50 | Temp 97.1°F | Wt 185.0 lb

## 2016-09-07 DIAGNOSIS — I119 Hypertensive heart disease without heart failure: Secondary | ICD-10-CM

## 2016-09-07 DIAGNOSIS — R109 Unspecified abdominal pain: Secondary | ICD-10-CM

## 2016-09-07 DIAGNOSIS — E559 Vitamin D deficiency, unspecified: Secondary | ICD-10-CM

## 2016-09-07 DIAGNOSIS — R5383 Other fatigue: Secondary | ICD-10-CM

## 2016-09-07 DIAGNOSIS — R10A1 Flank pain, right side: Secondary | ICD-10-CM

## 2016-09-07 MED ORDER — TIZANIDINE HCL 4 MG PO CAPS
4.0000 mg | ORAL_CAPSULE | Freq: Every evening | ORAL | 0 refills | Status: DC | PRN
Start: 2016-09-07 — End: 2016-11-21

## 2016-09-07 MED ORDER — CHOLECALCIFEROL 1000 UNITS PO TABS: 1000 [IU] | tablet | Freq: Every day | ORAL | 11 refills | 0 days | Status: DC

## 2016-09-07 MED ORDER — CHOLECALCIFEROL 25 MCG (1000 UT) PO TABS
1000.0000 [IU] | ORAL_TABLET | Freq: Every day | ORAL | 11 refills | Status: DC
Start: 2016-09-07 — End: 2017-10-25

## 2016-09-07 MED ORDER — TIZANIDINE HCL 4 MG PO CAPS: 4 mg | capsule | Freq: Every evening | ORAL | 0 refills | 0 days | Status: DC | PRN

## 2016-09-07 NOTE — Progress Notes (Signed)
[facility]    Prior authorization request for tizanidine (ZANAFLEX) 4 MG capsule    Patient has [employer] for prescription coverage.  I.D.# [id number]      **Please note, Tizanidine CAPSULES require a PA. The Tizanidine TABLETS do not.     Please let central refill know if it is okay to change to the Tablets and will call the pharmacy to change the Rx.     If not, please provide clinical rationale as to why the patient cannot take the tablets.

## 2016-09-07 NOTE — Progress Notes (Signed)
SUBJECTIVE  Mariah [name] is a 60 year old female, Heard speaker    -- ED FU - visit 9/18 for acute R flank pain  ? UTI, started on abx. 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 Searched in [email or web address]:    Cada comprimido contm:  diclofenaco sdico.................Marland Kitchen50 mg  paracetamol .....................300 mg  (acetaminophen)  carisoprodol..................125 mg  cafena....................30 mg    -- Lack of energy  Feels tired. Has been working many long hours, no breaks.   Anxious at night, cannot sleep well  Thinks about cutting down work, "I'm too old to be working like this, it's been 16 years".   Would like to return to [place], but has daughters and grandchildren here.   Has been eating more d/t anxiety, and is concerned bc she put some wt.     Most Recent Weight Reading(s)  09/07/16 : 83.9 kg (185 lb)  09/02/16 : 82.6 kg (182 lb)  08/30/16 : 81.6 kg (180 lb)  08/24/16 : 83.5 kg (184 lb)  05/18/16 : 92.5 kg (204 lb)    -- HTN  Pt reports checking BP at home, and they have been wnl on HCTZ alone   If she has readings > 140/90, she takes losartan, but it does not happen often. Denies chest pain, SOB, DOE, palpitations, dizziness, head aches, leg edema.     Most Recent BP Reading(s)  09/07/16 : 178/70  09/02/16 : 157/80  08/30/16 : 135/74  08/24/16 : 124/64  05/18/16 : 152/60    -- Data review from last visit, low iron (40), elevated Alk Phosph  Otherwise, CMP wnl, lipids wnl, ferritin, calcium, PTH folate B12, TIBC wnl    OBJECTIVE:  BP 178/70   Pulse 50   Temp 97.1 F (36.2  C) (Oral)   Wt 83.9 kg (185 lb)   SpO2 100%   BMI 30.79 kg/m2  Pleasant, in NAD  HEENT: normocephalic.  Eyes with no lesions or discharge, EOM intact. Ears with TM pearly gray, light cone reflex, canals clear bl. Nose patent, with scant clear rhinorrhea. Oropharynx mucosa pink and moist, teeth in good repair, uvula midline, tonsils 1+ bl with no exudates.  Neck: supple, full ROM, no lymphadenopathy, no palpable masses on thyroid  Heart: S1 and S2 normal, no murmurs, clicks, gallops or rubs. Regular rate and rhythm.   Lungs:  clear; no wheezes, rhonchi or rales.  Back: On inspection, back is symmetric. There are no obvious deformities, erythema or edema. There is no tenderness to palpation over the cervical spine. There is no tenderness to palpation on paravertebral muscles. There is mild tenderness to palpation on lumbar region R>L She has full range of motion on flexion, extension, and rotation. Straight leg raise is negative bilaterally.     ASSESSMENT & PLAN:  (R10.9) Right flank pain  (primary encounter diagnosis)  Comment: Appears myofascial, neg renal w/u per imaging during ED visit, sx appears to be resolving over time. Discussed stretching, using warm compresses. Advised agst [place] med d/t diclofenac, as pt was instructed to avoid  NSAID after bariatric surgery. Will try muscle relaxant with acetaminophen. Expect gradual resolution of sx. Drowsy precautions with MR reviewed.   Plan: tizanidine (ZANAFLEX) 4 MG capsule            (R53.83) Lack of energy  Comment: This is a common complaint of pt, who tends to be overworked, and has little insight on the effect of excessive work, poor sleep, on general health. Wonders if she should restart vit D, she usually feels better on it. Given decreased absorption d/t bariatric surgery, and limited exposure to sunlight, it may be a good idea. Her iron was low, though ferritin is wnl. She's taking iron supplements once a day, we discussed increasing to BID.   For  general health, stay well hydrated, eat healthy at regular intervals (foods rich in dietary fiber, low sugar concentration, minimally processed are always preferred), sleep at least 8h at night, exercise regularly.  Plan: cholecalciferol (VITAMIN D3) 1000 UNIT tablet            (E55.9) Hypovitaminosis D  Comment:   Plan: cholecalciferol (VITAMIN D3) 1000 UNIT tablet            (I11.9) Benign hypertensive heart disease without heart failure  Comment: ELevated BP in clinic. Pt says that readings are wnl with home monitoring. She's on daily HCTZ, and occasionally takes losartan if readings > 140/90  Plan: Continue home monitoring, mgmt as above. Keep log. If notices that there are 4+ readings > 140/90 in a week, should consider restarting losartan daily.       The patient was ready to learn and no apparent learning or adherence barriers were identified. I explained the diagnosis and treatment plan, and the patient expressed understanding of the content. I attempted to answer any questions regarding the diagnosis and the proposed treatment.    Possible side effects of the prescribed medication was explained. We discussed the patients current medications.  We discussed the importance of medication compliance. The patient expressed understanding and no barriers to adherence were identified.    follow-up will be scheduled prn  she has been advised to call or return with any worsening or new problems

## 2016-10-09 MED FILL — *RANITIDINE 150MG: 30 days supply | Qty: 60 | Fill #1 | Status: CP

## 2016-10-09 MED FILL — CALC OYST SHELL/D 500/200MG: 30 days supply | Qty: 120 | Fill #1 | Status: CP

## 2016-10-09 MED FILL — TIZANIDINE  4MG: 30 days supply | Qty: 30 | Fill #0 | Status: CP

## 2016-10-09 MED FILL — OMEPRAZOLE   20MG: 30 days supply | Qty: 30 | Fill #1 | Status: CP

## 2016-10-09 MED FILL — *LOSARTAN POT 100MG: 30 days supply | Qty: 30 | Fill #1 | Status: CP

## 2016-10-09 MED FILL — SIMVASTATIN 10MG: 30 days supply | Qty: 30 | Fill #1 | Status: CP

## 2016-10-09 MED FILL — *HYDROCHLOROT 25MG: 30 days supply | Qty: 30 | Fill #1 | Status: CP

## 2016-10-09 MED FILL — VITAMIN D3 1000UNIT: 30 days supply | Qty: 30 | Fill #0 | Status: CP

## 2016-10-09 MED FILL — FERROUS GLUC 324 MG: 30 days supply | Qty: 60 | Fill #1 | Status: CP

## 2016-11-03 ENCOUNTER — Ambulatory Visit (HOSPITAL_BASED_OUTPATIENT_CLINIC_OR_DEPARTMENT_OTHER): Payer: PRIVATE HEALTH INSURANCE | Admitting: Plastic Surgery

## 2016-11-03 VITALS — Ht 63.0 in | Wt 174.0 lb

## 2016-11-03 DIAGNOSIS — R238 Other skin changes: Secondary | ICD-10-CM

## 2016-11-03 NOTE — Progress Notes (Signed)
Plastic and Reconstructive Surgery      Reason for Consultation: Mariah [name] is a 60 year old F with complaints regarding her face    Specifically, the patient is not happy with   1. A fatty bulge at the submental area/neck  2. Excess rhytids at or near the nasolabial fold  3. Excess upper eyelid skin    The patient has recently undergone RYGB at [facility] 04/2016. Was 222# preop and now is 177# and continues to lose weight. She states she has now reached her goal however.     Regarding her eyes, no vision complaints. Wears glasses for far sight as well as reading.   No dry eyes.     No previous plastic surgery. PMH sig for HTN. She takes Losartan. Takes supplements daily. Non-smoker.     Past medical history is significant for:  Past Medical History:  03/27/2006: ABN FIND-STOOL CONTENTS-OCC BLOOD      Comment: s/p colonoscopy and EGD 7/07  06/04/2006: ABSENCE OF MENSTRUATION - s/p TAH BSO  08/05/2004: ANXIETY DEPRESSION      Comment: TSH wnl 8/02; trial of fluoxetine 8/05  08/05/2004: BACK PAIN LOW      Comment: LBP musculoskeletal most likely 1/04  04/23/2006: Cough      Comment: s/p PFTs September 2007 --  some obstruction                at mid to low lung volumes not correctable by                bronchodilator CT scan showed stable pulmonary                nodules over several years 11/07 - cough gone                ** note combined with dx of "nodules on CT                scan". Will file this note to hx.     08/05/2004: depressive disorder      Comment: TSH wnl 8/02; trial of fluoxetine x 6 wks                didn't really help 8/05  trial of citalopram                per Dr. Patrice Paradise. later resolved. 04/15/2014 -                Reassessed, pt reports feeling well, with no sx               of depression. Just reports being tired, which                she attributes to current life style. Will                resolve from problem list.    03/27/2006: Diarrhea      Comment: 7/07 The colonoscopy was visually normal and                 biopsies taken to rule out microscopic colitis                were negative.  A biopsy of the descending                duodenum to rule out celiac sprue because of                the diarrhea was negative.  12/19/2012: Gastritis      Comment: Combined with entry "esophageal reflux    10/22/2006: Hyperparathyroidism      Comment: osteoporosis found - pt referred to surgery                for the hyperparathyroidism- s/p                parathyroidectomy feb 2008  06/18/2012: Inconclusive mammogram      Comment: June 2013 - BIRADS 3. Probable benign                mammogram. Recommend 6 month  follow-up                mammogram (Dec 2013).  Dec/13 - Stable left                breast nodule with no evidence of malignancy.                BIRADS 2 benign recommend annual screening                mammogram in 6 months  (June 2014) June/14 -                mammogram request given to pt, she will call to               schedule:  Jan/15 - normal mammogram, yearly                screening. Resolve from problem list.        03/13/2006: Insomnia, unspecified      Comment: 06/25/2012 - intermittent, usually resolved by                taking Tylenol PM 06/09/2014 - good response to                trazodone, takes one to 4x wk, prn 08/24/2016 -                reports no problems sleeping - file to hx, and                resolve from problem list.   03/27/2006: nodules on chest CT 1/06 and 3/07      Comment: s/p PFTs September 2007 --  some obstruction                at mid to low lung volumes not correctable by                bronchodilator CT scan showed stable pulmonary                nodules over several years 11/07 - cough gone                 04/29/12 - saw pneumologist, Dr Jordan Hawks, "Will                repeat chest CT with high-resolution scans at                the 2 month mark (already ordered). If                abnormal, would do PFT's and further rheum w/u,               consider lung bx . Pt to call if any r  08/05/2004:  OBESITY NOS      Comment: has tried meds from [place]  08/05/2004: PERS HX OF  PAST NONCOMPLIANCE      Comment: poor compliance to med regimen  08/05/2004: PERS HX TOBACCO USE      Comment: quit 10/02; 1/2 PPD since 60 YO  08/05/2004: Screening for hypertension      Comment: occ elevated BP started on anti-HTN in [place];               chem 7, EKG, HCT u/a wnl 4/03; d/c'ed HCTZ 8/05               when we learned from daughter she has not been                taking it  05/19/2008: Vitamin D deficiency      Comment: Supplement per dr Jake Samples.    Past surgical history includes:  Past Surgical History:  04/2016: BARIATRIC SURGERY      Comment: done at [place]  2006: PARATHYROIDECTOMY      Comment: partial,   5/00: SALPINGO-OOPHORECTOMY COMPL/PRTL UNI/BI SPX      Comment: Salpingo-Oophorectomy, bilateral  No date: TONSILLECTOMY ONE-HALF <AGE 33  5/00: TOTAL ABDOMINAL HYSTERECT W/WO RMVL TUBE OVARY      Comment: Hysterectomy, Total Abdominal secondary to                fibroids    Allergies to:  Review of Patient's Allergies indicates:   Heparin                     Comment:local rash seen during hospitalization Feb 2008   Lisinopril              Cough      Current Outpatient Prescriptions on File Prior to Visit:  cholecalciferol (VITAMIN D3) 1000 UNIT tablet Take 1 tablet by mouth daily Disp: 30 tablet Rfl: 11   cefpodoxime (VANTIN) 200 MG tablet TAKE 1 TABLET BY MOUTH TWICE A DAY. Disp:  Rfl: 0   raNITIdine (ZANTAC) 150 MG tablet Take 1 tablet by mouth 2 (two) times daily Disp: 60 tablet Rfl: 5   calcium-vitamin D (CALCIUM 500/D) 500-200 MG-UNIT per tablet Take 2 tablets by mouth 2 (two) times daily Disp: 120 tablet Rfl: 5   ferrous gluconate (FERGON) 324 MG tablet Take 1 tablet by mouth 2 (two) times daily Disp: 60 tablet Rfl: 5   losartan (COZAAR) 100 MG tablet Take 1 tablet by mouth daily Disp: 30 tablet Rfl: 5   hydrochlorothiazide (HYDRODIURIL) 25 MG tablet Take 1 tablet by mouth daily Disp: 30 tablet Rfl: 5    Biotin 1000 MCG TABS Tablet Take 1,000 mcg by mouth daily. Disp:  Rfl:      No current facility-administered medications on file prior to visit.       Social History  Social History   Marital status: Married  Spouse name: N/A    Years of education: N/A  Number of children: N/A     Occupational History  None on file     Social History Main Topics   Smoking status: Former Smoker  0.50 Packs/day  For 40.00 Years     Types: Cigarettes    Quit date: 03/31/2012    Smokeless tobacco: Former Systems developer    Comment: 3-4 cigarretes a day. Quit about 1 month ago    Alcohol use Yes    Comment: very occasional    Drug use: No    Sexual activity: Yes    Partners: Male    Comment: histerectomy     Other  Topics Concern   None on file     Social History Narrative    work: Engineer, building services; owned bakery in [place]    Lives w/ daughter 41 YO (Durward Fortes Buchanan) & son-in-law Lowella Dandy)    Divorced; currently not in a relationship        05/27/2015    From [place]. Works as Electrical engineer, has own business.     Married, Djaniro.     No regular exercise    Diet: varies. Eats at irregular intervals. Tries to watch diet, but likes to eat    Stressors: work    Pleasures: family, grand children        08/24/2016    Same living and work situation. No regular exercise. Diet improved after bariatric surgery, and pt is very happy with results.        On physical exam,  HT 5'3"  WT 174lbs  BP wnl  NAD, well appearing  CN 2-12 intact, symmetric facies  Transverse rhytids noted on forehead, short forehead 5 cm  She has notable skin excess b/l upper lids, brows are at supraorbital rim, no ptosis, levator function is good  Lower lids grossly wnl  Has excess skin near nasolabial folds b/l, no jowls, marionette lines  Excess fat at submental area, no platysmal bands  No scars    Diagnostic Studies: na    Assessment: Mariah [name] is a 60 year old F with an aging face in the context of massive weight loss. We spent approximately 30 minutes today discussing the case  and care with the patient with more than half of this time spent discussing coordination of care and counseling. The plan is to wait until a stable weight has been achieved and proceed at 6 months following this with blepharoplasty, face/neck lift, and possible coronal brow lift. We reviewed the risks of the procedure which include but are not limited to bleeding, infection, scar, wound healing problems, asymmetry, injury to nearby structures and finally the need for further surgery.      She will see me in 4 months to assess her weight and further planning.

## 2016-11-21 ENCOUNTER — Other Ambulatory Visit (HOSPITAL_BASED_OUTPATIENT_CLINIC_OR_DEPARTMENT_OTHER): Payer: Self-pay | Admitting: Internal Medicine

## 2016-11-21 DIAGNOSIS — R10A1 Flank pain, right side: Secondary | ICD-10-CM

## 2016-11-21 DIAGNOSIS — R109 Unspecified abdominal pain: Secondary | ICD-10-CM

## 2016-11-21 MED FILL — SIMVASTATIN 10MG: 30 days supply | Qty: 30 | Fill #2 | Status: CP

## 2016-11-21 MED FILL — FERROUS GLUC 324 MG: 30 days supply | Qty: 60 | Fill #2 | Status: CP

## 2016-11-21 MED FILL — *HYDROCHLOROT 25MG: 30 days supply | Qty: 30 | Fill #2 | Status: CP

## 2016-11-21 MED FILL — *LOSARTAN POT 100MG: 30 days supply | Qty: 30 | Fill #2 | Status: CP

## 2016-11-21 MED FILL — CALC OYST SHELL/D 500/200MG: 30 days supply | Qty: 120 | Fill #2 | Status: CP

## 2016-11-21 MED FILL — *RANITIDINE  150MG: 30 days supply | Qty: 60 | Fill #2 | Status: CP

## 2016-11-21 MED FILL — VITAMIN D3 1000UNIT: 30 days supply | Qty: 30 | Fill #1 | Status: CP

## 2016-11-21 MED FILL — OMEPRAZOLE   20MG: 30 days supply | Qty: 30 | Fill #2 | Status: CP

## 2016-11-21 NOTE — Progress Notes (Signed)
PER Pharmacy, Mariah [name] is a 60 year old female has requested a refill of      - Tizanidine       Last Office Visit: 09/07/2016 with Lawernce Pitts  Last Physical Exam: 08/24/2016      Other Med Adult:  Most Recent BP Reading(s)  09/07/16 : 178/70          Cholesterol (mg/dL)   Date Value   08/24/2016 190   ----------    LOW DENSITY LIPOPROTEIN DIRECT (mg/dL)   Date Value   08/24/2016 100   ----------    HIGH DENSITY LIPOPROTEIN (mg/dL)   Date Value   08/24/2016 71   ----------    TRIGLYCERIDES (mg/dL)   Date Value   08/24/2016 95   ----------        THYROID SCREEN TSH REFLEX FT4 (uIU/mL)   Date Value   06/09/2014 2.040   ----------        TSH (THYROID STIM HORMONE) (uIU/mL)   Date Value   04/03/2012 1.68   ----------    No results found for: HGBA1C    No results found for: POCA1C        INR (no units)   Date Value   04/28/2014 < 1.0   01/18/2007 1.0 (L)   ----------      SODIUM (mmol/L)   Date Value   08/24/2016 141   ----------      POTASSIUM (mmol/L)   Date Value   08/24/2016 4.3   ----------          CREATININE (mg/dL)   Date Value   08/24/2016 0.8   ----------      Documented patient preferred pharmacies:    [facility]  Phone: [phone] Fax: [phone]

## 2016-11-22 MED FILL — TIZANIDINE  4MG: 20 days supply | Qty: 30 | Fill #0 | Status: CP

## 2016-12-27 MED FILL — FERROUS GLUC 324 MG: 30 days supply | Qty: 60 | Fill #3 | Status: CP

## 2016-12-27 MED FILL — CALC OYST SHELL/D 500/200MG: 30 days supply | Qty: 120 | Fill #3 | Status: CP

## 2016-12-27 MED FILL — VITAMIN D3 1000UNIT: 30 days supply | Qty: 30 | Fill #2 | Status: CP

## 2016-12-27 MED FILL — OMEPRAZOLE   20MG: 30 days supply | Qty: 30 | Fill #3 | Status: CP

## 2016-12-27 MED FILL — *HYDROCHLOROT 25MG: 30 days supply | Qty: 30 | Fill #3 | Status: CP

## 2016-12-27 MED FILL — *RANITIDINE  150MG: 30 days supply | Qty: 60 | Fill #3 | Status: CP

## 2016-12-27 MED FILL — *LOSARTAN POT 100MG: 30 days supply | Qty: 30 | Fill #3 | Status: CP

## 2016-12-27 MED FILL — SIMVASTATIN 10MG: 30 days supply | Qty: 30 | Fill #3 | Status: CP

## 2017-02-05 MED FILL — VITAMIN D3 1000UNIT: 30 days supply | Qty: 30 | Fill #3 | Status: CP

## 2017-02-05 MED FILL — FERROUS GLUC 324 MG: 30 days supply | Qty: 60 | Fill #4 | Status: CP

## 2017-02-05 MED FILL — OMEPRAZOLE   20MG: 30 days supply | Qty: 30 | Fill #4 | Status: CP

## 2017-02-05 MED FILL — *LOSARTAN POT 100MG: 30 days supply | Qty: 30 | Fill #4 | Status: CP

## 2017-02-05 MED FILL — *HYDROCHLOROT 25MG: 30 days supply | Qty: 30 | Fill #4 | Status: CP

## 2017-02-05 MED FILL — CALC OYST SHELL/D 500/200MG: 30 days supply | Qty: 120 | Fill #4 | Status: CP

## 2017-02-05 MED FILL — *RANITIDINE  150MG: 30 days supply | Qty: 60 | Fill #4 | Status: CP

## 2017-02-05 MED FILL — SIMVASTATIN 10MG: 30 days supply | Qty: 30 | Fill #4 | Status: CP

## 2017-03-09 ENCOUNTER — Ambulatory Visit (HOSPITAL_BASED_OUTPATIENT_CLINIC_OR_DEPARTMENT_OTHER): Payer: Medicaid Other | Admitting: Plastic Surgery

## 2017-03-20 MED FILL — *LOSARTAN POT 100MG: 30 days supply | Qty: 30 | Fill #5 | Status: CP

## 2017-03-20 MED FILL — FERROUS GLUC 324 MG: 30 days supply | Qty: 60 | Fill #5 | Status: CP

## 2017-03-20 MED FILL — HYDROCHLOROT 25MG: 30 days supply | Qty: 30 | Fill #5 | Status: CP

## 2017-03-20 MED FILL — VITAMIN D3 1000UNIT: 30 days supply | Qty: 30 | Fill #4 | Status: CP

## 2017-03-20 MED FILL — OMEPRAZOLE   20MG: 30 days supply | Qty: 30 | Fill #5 | Status: CP

## 2017-03-20 MED FILL — SIMVASTATIN 10MG: 30 days supply | Qty: 30 | Fill #5 | Status: CP

## 2017-03-20 MED FILL — CALC OYST SHELL/D 500/200MG: 30 days supply | Qty: 120 | Fill #5 | Status: CP

## 2017-04-19 ENCOUNTER — Ambulatory Visit (HOSPITAL_BASED_OUTPATIENT_CLINIC_OR_DEPARTMENT_OTHER): Payer: Medicaid Other | Admitting: Internal Medicine

## 2017-05-10 ENCOUNTER — Other Ambulatory Visit (HOSPITAL_BASED_OUTPATIENT_CLINIC_OR_DEPARTMENT_OTHER): Payer: Self-pay | Admitting: Internal Medicine

## 2017-05-10 DIAGNOSIS — K219 Gastro-esophageal reflux disease without esophagitis: Secondary | ICD-10-CM

## 2017-05-10 DIAGNOSIS — I119 Hypertensive heart disease without heart failure: Secondary | ICD-10-CM

## 2017-05-10 MED FILL — *LOSARTAN POT 100MG: 30 days supply | Qty: 30 | Fill #0 | Status: CP

## 2017-05-10 MED FILL — ERYTHROMYCIN OIN OP: 10 days supply | Qty: 8 | Fill #0 | Status: CP

## 2017-05-10 MED FILL — SIMVASTATIN 10MG: 30 days supply | Qty: 30 | Fill #0 | Status: CP

## 2017-05-10 MED FILL — *RANITIDINE  150MG: 30 days supply | Qty: 60 | Fill #5 | Status: CP

## 2017-05-10 MED FILL — CLONIDINE  0.1MG: 2 days supply | Qty: 2 | Fill #0 | Status: CP

## 2017-05-10 MED FILL — OMEPRAZOLE   20MG: 30 days supply | Qty: 30 | Fill #0 | Status: CP

## 2017-05-10 MED FILL — *CEPHALEXIN 500MG: 7 days supply | Qty: 28 | Fill #0 | Status: CP

## 2017-05-10 MED FILL — ALPRAZOLAM  2MG: 1 days supply | Qty: 1 | Fill #0 | Status: CP

## 2017-05-10 MED FILL — HYDROCHLOROT 25MG: 30 days supply | Qty: 30 | Fill #0 | Status: CP

## 2017-05-10 MED FILL — METHYLPRED  PAK 4MG: 7 days supply | Qty: 21 | Fill #0 | Status: CP

## 2017-05-10 MED FILL — VITAMIN D3 1000UNIT: 30 days supply | Qty: 30 | Fill #5 | Status: CP

## 2017-05-10 NOTE — Progress Notes (Signed)
PER Pharmacy, Mariah [name] is a 61 year old female has requested a refill of      - Losartan   - Hydrochlorothiazide   - Simvastatin   - Omeprazole      Last Office Visit: 09/07/2016 with Lawernce Pitts  Last Physical Exam: 08/24/2016      Other Med Adult:  Most Recent BP Reading(s)  09/07/16 : 178/70          Cholesterol (mg/dL)   Date Value   08/24/2016 190   ----------    LOW DENSITY LIPOPROTEIN DIRECT (mg/dL)   Date Value   08/24/2016 100   ----------    HIGH DENSITY LIPOPROTEIN (mg/dL)   Date Value   08/24/2016 71   ----------    TRIGLYCERIDES (mg/dL)   Date Value   08/24/2016 95   ----------        THYROID SCREEN TSH REFLEX FT4 (uIU/mL)   Date Value   06/09/2014 2.040   ----------        TSH (THYROID STIM HORMONE) (uIU/mL)   Date Value   04/03/2012 1.68   ----------    No results found for: HGBA1C    No results found for: POCA1C        INR (no units)   Date Value   04/28/2014 < 1.0   01/18/2007 1.0 (L)   ----------      SODIUM (mmol/L)   Date Value   08/24/2016 141   ----------      POTASSIUM (mmol/L)   Date Value   08/24/2016 4.3   ----------          CREATININE (mg/dL)   Date Value   08/24/2016 0.8   ----------    Documented patient preferred pharmacies:    [place], [place], [place] - [place]  Phone: [phone] Fax: [phone]

## 2017-07-02 MED FILL — *LOSARTAN POT 100MG: 30 days supply | Qty: 30 | Fill #1 | Status: CP

## 2017-07-02 MED FILL — OMEPRAZOLE   20MG: 30 days supply | Qty: 30 | Fill #1 | Status: CP

## 2017-07-02 MED FILL — HYDROCHLOROT 25MG: 30 days supply | Qty: 30 | Fill #1 | Status: CP

## 2017-07-04 ENCOUNTER — Other Ambulatory Visit (HOSPITAL_BASED_OUTPATIENT_CLINIC_OR_DEPARTMENT_OTHER): Payer: Self-pay | Admitting: Internal Medicine

## 2017-07-04 DIAGNOSIS — Z9884 Bariatric surgery status: Secondary | ICD-10-CM

## 2017-07-04 MED FILL — NYSTATIN POW 100000: 30 days supply | Qty: 60 | Fill #0 | Status: CP

## 2017-07-05 MED FILL — FERROUS GLUC 324 MG: 30 days supply | Qty: 60 | Fill #0 | Status: CP

## 2017-07-05 NOTE — Progress Notes (Signed)
PER Pharmacy, Mariah [name] is a 61 year old female has requested a refill of Ferrous Gluconate 324 mg.      Last Office Visit: 09/07/16 with M. Eustace Pen  Last Physical Exam: 08/24/16      Other Med Adult:  Most Recent BP Reading(s)  09/07/16 : 178/70          Cholesterol (mg/dL)   Date Value   08/24/2016 190   ----------    LOW DENSITY LIPOPROTEIN DIRECT (mg/dL)   Date Value   08/24/2016 100   ----------    HIGH DENSITY LIPOPROTEIN (mg/dL)   Date Value   08/24/2016 71   ----------    TRIGLYCERIDES (mg/dL)   Date Value   08/24/2016 95   ----------        THYROID SCREEN TSH REFLEX FT4 (uIU/mL)   Date Value   06/09/2014 2.040   ----------        TSH (THYROID STIM HORMONE) (uIU/mL)   Date Value   04/03/2012 1.68   ----------    No results found for: HGBA1C    No results found for: POCA1C        INR (no units)   Date Value   04/28/2014 < 1.0   01/18/2007 1.0 (L)   ----------      SODIUM (mmol/L)   Date Value   08/24/2016 141   ----------      POTASSIUM (mmol/L)   Date Value   08/24/2016 4.3   ----------          CREATININE (mg/dL)   Date Value   08/24/2016 0.8   ----------    Documented patient preferred pharmacies:    [facility], [place], [place] - [place]  Phone: [phone] Fax: [phone]

## 2017-07-11 ENCOUNTER — Ambulatory Visit (HOSPITAL_BASED_OUTPATIENT_CLINIC_OR_DEPARTMENT_OTHER): Payer: Medicaid Other | Admitting: Internal Medicine

## 2017-07-19 ENCOUNTER — Ambulatory Visit (HOSPITAL_BASED_OUTPATIENT_CLINIC_OR_DEPARTMENT_OTHER): Payer: Medicaid Other | Admitting: Internal Medicine

## 2017-07-19 ENCOUNTER — Encounter (HOSPITAL_BASED_OUTPATIENT_CLINIC_OR_DEPARTMENT_OTHER): Payer: Self-pay | Admitting: Internal Medicine

## 2017-07-19 NOTE — Progress Notes (Signed)
Left without being seen    A user error has taken place: encounter opened in error, closed for administrative reasons.

## 2017-07-20 ENCOUNTER — Ambulatory Visit (HOSPITAL_BASED_OUTPATIENT_CLINIC_OR_DEPARTMENT_OTHER): Payer: Medicaid Other | Admitting: Internal Medicine

## 2017-08-08 ENCOUNTER — Other Ambulatory Visit (HOSPITAL_BASED_OUTPATIENT_CLINIC_OR_DEPARTMENT_OTHER): Payer: Self-pay | Admitting: Internal Medicine

## 2017-08-08 DIAGNOSIS — Z1239 Encounter for other screening for malignant neoplasm of breast: Secondary | ICD-10-CM

## 2017-08-15 ENCOUNTER — Ambulatory Visit (HOSPITAL_BASED_OUTPATIENT_CLINIC_OR_DEPARTMENT_OTHER): Payer: Self-pay | Admitting: Internal Medicine

## 2017-08-15 DIAGNOSIS — Z1239 Encounter for other screening for malignant neoplasm of breast: Secondary | ICD-10-CM

## 2017-08-15 MED FILL — LOSARTAN POT 100MG: 30 days supply | Qty: 30 | Fill #2 | Status: CP

## 2017-08-15 MED FILL — OMEPRAZOLE   20MG: 30 days supply | Qty: 30 | Fill #2 | Status: CP

## 2017-08-15 MED FILL — NYSTATIN   POW 100000: 30 days supply | Qty: 60 | Fill #1 | Status: CP

## 2017-08-15 MED FILL — FERROUS GLUC 324 MG: 30 days supply | Qty: 60 | Fill #1 | Status: CP

## 2017-08-15 MED FILL — HYDROCHLOROT 25MG: 30 days supply | Qty: 30 | Fill #2 | Status: CP

## 2017-08-15 NOTE — Addendum Note (Signed)
Addended by: Tami Lin on: 08/15/2017 03:28 PM     Modules accepted: Orders

## 2017-08-16 LAB — MA SCREENING MAMMO BILATERAL DIGITAL WITH DBT & CAD

## 2017-09-06 ENCOUNTER — Encounter (HOSPITAL_BASED_OUTPATIENT_CLINIC_OR_DEPARTMENT_OTHER): Payer: Self-pay | Admitting: Internal Medicine

## 2017-09-06 ENCOUNTER — Ambulatory Visit (HOSPITAL_BASED_OUTPATIENT_CLINIC_OR_DEPARTMENT_OTHER): Payer: Medicaid Other | Admitting: Internal Medicine

## 2017-09-06 VITALS — BP 152/74 | HR 57 | Wt 177.0 lb

## 2017-09-06 DIAGNOSIS — R42 Dizziness and giddiness: Secondary | ICD-10-CM

## 2017-09-06 DIAGNOSIS — R7989 Other specified abnormal findings of blood chemistry: Secondary | ICD-10-CM

## 2017-09-06 DIAGNOSIS — H9190 Unspecified hearing loss, unspecified ear: Secondary | ICD-10-CM

## 2017-09-06 DIAGNOSIS — F32A Depression, unspecified: Secondary | ICD-10-CM | POA: Insufficient documentation

## 2017-09-06 DIAGNOSIS — I119 Hypertensive heart disease without heart failure: Secondary | ICD-10-CM

## 2017-09-06 DIAGNOSIS — K219 Gastro-esophageal reflux disease without esophagitis: Secondary | ICD-10-CM

## 2017-09-06 DIAGNOSIS — F320 Major depressive disorder, single episode, mild: Principal | ICD-10-CM

## 2017-09-06 MED ORDER — RANITIDINE HCL 150 MG PO TABS: 150 mg | tablet | Freq: Two times a day (BID) | ORAL | 5 refills | 0 days | Status: DC

## 2017-09-06 MED ORDER — RANITIDINE HCL 150 MG PO TABS
150.0000 mg | ORAL_TABLET | Freq: Two times a day (BID) | ORAL | 5 refills | Status: DC
Start: 2017-09-06 — End: 2017-10-25

## 2017-09-06 MED ORDER — CITALOPRAM HYDROBROMIDE 10 MG PO TABS: 10 mg | tablet | Freq: Every day | ORAL | 2 refills | 0 days | Status: DC

## 2017-09-06 MED ORDER — CITALOPRAM HYDROBROMIDE 10 MG PO TABS
10.0000 mg | ORAL_TABLET | Freq: Every day | ORAL | 2 refills | Status: DC
Start: 2017-09-06 — End: 2017-10-25

## 2017-09-06 NOTE — Progress Notes (Signed)
SUBJECTIVE  Mariah [name] is a 61 year old female, [place] speaker, known to this provider    "It's going to be a long visit today. I need to talk to you about many things"    #) Saw ophthalmologist, private practice, to evaluate surgery an no longer wear glasses. ? Glaucoma. Provider also recommended doing a carotid US, pt is not clear why. She has an eye exam at [facility] scheduled for December for further evaluation of her eyes.   She reports occasional light headedness, unclear of triggers   Sometimes she needs to take very deep breaths, as if to catch her breath, even though she's not doing any effort    #) Decreased hearing R>L., Noticed by the need to increase TV volume    #) She's being seen at [facility], bariatric surgeon. Had LFTs, and abd [place]. She was told to have fatty liver. She's concerned bc she was told to lose wt, and all she can do is to gain weight d/t emotional eating (more on this below). She was also told there is "fluid in the kidneys" that need follow up. This was already arranged.     Most Recent Weight Reading(s)  09/06/17 : 80.3 kg (177 lb)  07/19/17 : 78.9 kg (174 lb)  11/03/16 : 78.9 kg (174 lb)  09/07/16 : 83.9 kg (185 lb)  09/02/16 : 82.6 kg (182 lb)    #) Insomnia  Feels like she's crying often, would like to see a [place] speaking therapist  Notices being more anxious, lacks energy, increased irritability, emotional eating. Admits to low libido, no interest in sex (admits this has always been true, but she felt it was not an issue to be discussed). Admits frustration with facial plastic surgery she did a few months ago. Admits to no motivation for self care, or to do pleasurable things (such as dancing). Admits to prefer to be in her bedroom, and just sleep.   Not sure what is going on. Cannot identify particular stressors. Work is stable, family situation is stable. Denies financial concerns.   Denies SI/HI  Sister has ? Bipolar in [place]. Other family members with depression. Pt has  never been in therapy, never took medication for depression (always afraid of side effects and becoming addicted to it).     OBJECTIVE:  BP 152/74 (Site: RA, Position: Sitting, Cuff Size: Lrg)  Pulse 57  Wt 80.3 kg (177 lb)  SpO2 100%  BMI 31.35 kg/m2  Most Recent BP Reading(s)  09/06/17 : 152/74  07/19/17 : 133/76  09/07/16 : 178/70  09/02/16 : 157/80  08/30/16 : 135/74  Pleasant, in NAD    Heart: S1 and S2 normal, no murmurs, clicks, gallops or rubs. Regular rate and rhythm. No carotid bruits appreciated on exam  Lungs:  clear; no wheezes, rhonchi or rales.  Mood: well nourished, well groomed, appropriate affect, teary at times. Speaking in full sentences, A&O x 3, good recollection of events. No delusions. No SI/HI    PHQ-9 = 14    ASSESSMENT & PLAN:  (F32.0) Mild depression ([place])  (primary encounter diagnosis)  Comment: As hx above. Supportive listening. We reviewed possible methods of tx, and pt agrees to referral to psych (requests [facility] speaking therapist), and start SSRI. I think citalopram will be a good choice, given her sx of emotional eating.   For general health, stay well hydrated, eat healthy at regular intervals (foods rich in dietary fiber, low sugar concentration, minimally processed are always preferred),  sleep at least 8h at night, exercise regularly.  Plan: REFERRAL TO ADULT PSYCHIATRY (INT)     (H91.90) Decreased hearing, unspecified laterality  Comment:   Plan: REFERRAL TO ENT ( INT)            (R42) Lightheadedness  Comment: given lightheadedness, and ophthalmology (private practice) suggestion to check carotid US (unclear reason, ? Suspect glaucoma), will order   Plan: DUPLEX SCAN EXTRACRANIAL ART UNI/LMTD STUDY            (K21.9) Gastroesophageal reflux disease, esophagitis presence not specified  Comment: needs refill  Plan: raNITIdine (ZANTAC) 150 MG tablet            (R79.89) Elevated LFTs  Comment: reviewed, pt concerned for dx of fatty liver. We reviewed previous exams, this is not new.  Unfortunately I don't have her most recent imaging report (done at [facility]), but labs show Alk Phosph 219, ALT 77 and AST 72 in July [facility])  Plan: We reviewed this condition, needs monitoring over time, wt loss. She had seen Dr Marcelene Butte in the past here. We could consider new GI consult as needed.    (I11.9) Benign hypertensive heart disease without heart failure  Comment: borderline today, but pt was emotional during the visit  Plan: monitor. Continue taking meds.     The patient was ready to learn and no apparent learning or adherence barriers were identified. I explained the diagnosis and treatment plan, and the patient expressed understanding of the content. I attempted to answer any questions regarding the diagnosis and the proposed treatment.    Possible side effects of the prescribed medication was explained. We discussed the patients current medications.  We discussed the importance of medication compliance. The patient expressed understanding and no barriers to adherence were identified.    I've explained to her that drugs of the SSRI class can have side effects such as weight gain, sexual dysfunction, insomnia, headache, nausea. These medications are generally effective at alleviating symptoms of anxiety and/or depression. Let me know if significant side effects do occur.    > 50% of this 40 minute visit was spent in face to face counseling and coordination of care    follow-up will be scheduled 4 wk to FU on medication  she has been advised to call or return with any worsening or new problems

## 2017-09-07 ENCOUNTER — Other Ambulatory Visit (HOSPITAL_BASED_OUTPATIENT_CLINIC_OR_DEPARTMENT_OTHER): Payer: Self-pay | Admitting: Social Worker

## 2017-09-07 NOTE — Progress Notes (Signed)
09/07/17-Thank you for your referral. Forwarded to Jalene Mullet for [place] clinic.

## 2017-09-18 ENCOUNTER — Ambulatory Visit: Payer: Self-pay | Admitting: Internal Medicine

## 2017-09-25 ENCOUNTER — Telehealth (HOSPITAL_BASED_OUTPATIENT_CLINIC_OR_DEPARTMENT_OTHER): Payer: Self-pay

## 2017-09-25 NOTE — Progress Notes (Signed)
This Probation officer is covering for [facility] pt with [place] interpreter for depression outreach  Per chart, started on citalopram 09/06/17 and has appt at [place] [facility] 10/02/17  Pt reached  Her mood has been "good"  She is feeling better than at 09/06/17, "thank God"  She has not started citalopram medication because she was traveling and couldn't pick it up  She plans to pick it up today or tomorrow  Confirm appt with therapist at [facility] 10/02/17  No additional questions or concerns at this time

## 2017-09-25 NOTE — Progress Notes (Signed)
Gila!   I saw her 09/06/2017, PHQ-9 = 14. Started SSRI, referred to [facility].   Do you mind giving her a call to see how she's doing?   Thanks!!!   :-)

## 2017-10-01 ENCOUNTER — Ambulatory Visit: Payer: Self-pay | Admitting: Internal Medicine

## 2017-10-01 DIAGNOSIS — R42 Dizziness and giddiness: Secondary | ICD-10-CM

## 2017-10-01 NOTE — Addendum Note (Signed)
Addended byTawanna Solo on: 10/01/2017 04:56 PM     Modules accepted: Orders

## 2017-10-02 ENCOUNTER — Ambulatory Visit (HOSPITAL_BASED_OUTPATIENT_CLINIC_OR_DEPARTMENT_OTHER): Payer: Medicaid Other

## 2017-10-02 LAB — US CAROTID DUPLEX BILATERAL

## 2017-10-08 MED FILL — OMEPRAZOLE   20MG: 30 days supply | Qty: 30 | Fill #3 | Status: CP

## 2017-10-08 MED FILL — VITAMIN D 50000 IU (ERGO): 28 days supply | Qty: 4 | Fill #0 | Status: CP

## 2017-10-08 MED FILL — HYDROCHLOROT 25MG: 30 days supply | Qty: 30 | Fill #3 | Status: CP

## 2017-10-08 MED FILL — FERROUS GLUC 324 MG: 30 days supply | Qty: 60 | Fill #2 | Status: CP

## 2017-10-08 MED FILL — SIMVASTATIN 10MG: 30 days supply | Qty: 30 | Fill #1 | Status: CP

## 2017-10-08 MED FILL — LOSARTAN POT 100MG: 30 days supply | Qty: 30 | Fill #3 | Status: CP

## 2017-10-10 ENCOUNTER — Telehealth (HOSPITAL_BASED_OUTPATIENT_CLINIC_OR_DEPARTMENT_OTHER): Payer: Self-pay

## 2017-10-10 NOTE — Progress Notes (Signed)
Called pt with [place] interpreter to check in and see if she started citalopram  Pt reached  She is doing well  She started taking citalopram and it is going well so far  Denies s/e  Mood has been "fine"  Wasn't able to make therapy appt but rescheduled for another intake in Dec  Confirmed next appt with Lawernce Pitts

## 2017-10-25 ENCOUNTER — Ambulatory Visit (HOSPITAL_BASED_OUTPATIENT_CLINIC_OR_DEPARTMENT_OTHER): Payer: Medicaid Other | Admitting: Internal Medicine

## 2017-10-25 ENCOUNTER — Telehealth (HOSPITAL_BASED_OUTPATIENT_CLINIC_OR_DEPARTMENT_OTHER): Payer: Self-pay | Admitting: Internal Medicine

## 2017-10-25 VITALS — BP 158/75 | HR 66 | Temp 98.3°F | Wt 177.9 lb

## 2017-10-25 DIAGNOSIS — R5383 Other fatigue: Secondary | ICD-10-CM

## 2017-10-25 DIAGNOSIS — E559 Vitamin D deficiency, unspecified: Secondary | ICD-10-CM

## 2017-10-25 DIAGNOSIS — F320 Major depressive disorder, single episode, mild: Principal | ICD-10-CM

## 2017-10-25 DIAGNOSIS — I119 Hypertensive heart disease without heart failure: Secondary | ICD-10-CM

## 2017-10-25 DIAGNOSIS — K219 Gastro-esophageal reflux disease without esophagitis: Secondary | ICD-10-CM

## 2017-10-25 DIAGNOSIS — F32A Depression, unspecified: Secondary | ICD-10-CM

## 2017-10-25 DIAGNOSIS — Z7189 Other specified counseling: Secondary | ICD-10-CM

## 2017-10-25 DIAGNOSIS — Z7185 Encounter for immunization safety counseling: Secondary | ICD-10-CM

## 2017-10-25 MED ORDER — BIOTIN 1000 MCG PO TABS: 1000 ug | tablet | Freq: Every day | ORAL | 11 refills | 0 days | Status: AC

## 2017-10-25 MED ORDER — RANITIDINE HCL 150 MG PO TABS: 150 mg | tablet | Freq: Two times a day (BID) | ORAL | 5 refills | 0 days | Status: DC

## 2017-10-25 MED ORDER — CITALOPRAM HYDROBROMIDE 10 MG PO TABS: 10 mg | tablet | Freq: Every day | ORAL | 5 refills | 0 days | Status: DC

## 2017-10-25 MED ORDER — VITAMIN D (ERGOCALCIFEROL) 50000 UNITS PO CAPS: 50000 [IU] | capsule | ORAL | 2 refills | 0 days | Status: AC

## 2017-10-25 MED ORDER — CHOLECALCIFEROL 25 MCG (1000 UT) PO TABS
1000.0000 [IU] | ORAL_TABLET | Freq: Every day | ORAL | 11 refills | Status: DC
Start: 2017-10-25 — End: 2018-11-13

## 2017-10-25 MED ORDER — RANITIDINE HCL 150 MG PO TABS
150.0000 mg | ORAL_TABLET | Freq: Two times a day (BID) | ORAL | 5 refills | Status: DC
Start: 2017-10-25 — End: 2018-08-23

## 2017-10-25 MED ORDER — VITAMIN D (ERGOCALCIFEROL) 1.25 MG (50000 UT) PO CAPS
50000.0000 [IU] | ORAL_CAPSULE | ORAL | 2 refills | Status: DC
Start: 2017-10-25 — End: 2018-01-11

## 2017-10-25 MED ORDER — CHOLECALCIFEROL 1000 UNITS PO TABS: 1000 [IU] | tablet | Freq: Every day | ORAL | 11 refills | 0 days | Status: AC

## 2017-10-25 MED ORDER — CITALOPRAM HYDROBROMIDE 10 MG PO TABS
10.0000 mg | ORAL_TABLET | Freq: Every day | ORAL | 5 refills | Status: DC
Start: 2017-10-25 — End: 2018-08-23

## 2017-10-25 MED ORDER — BIOTIN 1000 MCG PO TABS
1000.0000 ug | ORAL_TABLET | Freq: Every day | ORAL | 11 refills | Status: DC
Start: 2017-10-25 — End: 2020-04-28

## 2017-10-25 MED FILL — *RANITIDINE  150MG: 30 days supply | Qty: 60 | Fill #0 | Status: CP

## 2017-10-25 MED FILL — BOOSTRIX INJ: 1 days supply | Qty: 1 | Fill #0 | Status: CP

## 2017-10-25 MED FILL — VITAMIN D3  1000UNIT: 30 days supply | Qty: 30 | Fill #0 | Status: CP

## 2017-10-25 MED FILL — CITALOPRAM 10MG: 30 days supply | Qty: 30 | Fill #0 | Status: CP

## 2017-10-25 NOTE — Progress Notes (Signed)
Influenza Vaccine Procedure  October 25, 2017    1. Has the patient received the information for the influenza vaccine? Yes    2. Does the patient have any of the following contraindications?  Allergy to eggs? No  Allergic reaction to previous influenza vaccines? No  Any other problems to previous influenza vaccines? No  Paralyzed by Guillain-Barre syndrome?  No  Currently pregnant? No  Current moderate or severe illness? No  Allergy to contact lens solution? No    3. The vaccine has been administered in the usual fashion and the patient/guardian was instructed to wait 20 minutes before leaving the building in the event of an allergic reaction:     Immunization information and current VIS for flu vaccine(s) reviewed; verbal consent given by patient/guardian.

## 2017-10-25 NOTE — Progress Notes (Signed)
Per orders of Dr. Eustace Pen, injection of Tdap given by Lianne Bushy. Patient instructed to remain in clinic for 20 minutes afterwards, and to report any adverse reaction to me immediately.  Pt educated on signs and symptoms of infection to report to clinic.  Pt verbalized understanding.    Interpreter # [phone] provided translation for entire administration of vaccines.

## 2017-10-25 NOTE — Progress Notes (Unsigned)
nurse from [facility] called the [facility] to complete a benefit analysis for the tdap Vaccine.   The vaccine is covered under the patient’s prescription coverage.     Please choose 90715.2 tdap (Prior Auth/Pharmacy)

## 2017-10-25 NOTE — Progress Notes (Signed)
SUBJECTIVE  Mariah [name] is a 61 year old female, [place] speaker, for FU Depression. We met 9/20, and she started SSRI    #) Says she's feeling well. She's taking SSRI with no side effects. Has no complaints. Missed appt with therapist, but it was rescheduled for December.    #) Data review:   Bilateral Carotid Duplex:   Impression:    1. Mild vascular plaques in the carotid bulbs bilaterally.    2. No hemodynamically significant stenosis (less than 50%).      Mammogram - normal    #) Med review, requests that all meds are sent to [facility] pharmacy given convenience of hours and location.    Uses ranitidine most times, prn, PPI as needed when ranitidine does not control sx. In average uses PPI < 1 wk. Fills rx once every 3 months or so    #) HTN  Pt reports taking medications daily. Denies chest pain, SOB, DOE, palpitations, dizziness, head aches, leg edema.   Most Recent BP Reading(s)  10/25/17 : 158/75  09/06/17 : 152/74  07/19/17 : 133/76  09/07/16 : 178/70  09/02/16 : 157/80     ROS: No fevers or unexplained weight loss. No new headaches. No shortness of breath or chest pain.    OBJECTIVE:  BP 158/75 (Site: LA, Position: Sitting, Cuff Size: Reg)  Pulse 66  Temp 98.3 F (36.8 C) (Oral)  Wt 80.7 kg (177 lb 14.4 oz)  SpO2 98%  BMI 31.51 kg/m2  Pleasant, in NAD    PE: deferred    ASSESSMENT & PLAN:  (F32.0) Mild depression (HCC)  (primary encounter diagnosis)  Comment: Started SSRI with no SE, says she feels "excellent".   We reviewed continuing medication for at least 6 months, given her ups and downs. She rescheduled missed appt with psychotherapist at [facility], and plans to keep that visit.   Plan: citalopram (CELEXA) 10 MG tablet            (K21.9) Gastroesophageal reflux disease, esophagitis presence not specified  Comment: Taker ranitidine prn, a few times a week, usually with good relief of sx. Uses PPI < 1x week for more severe episodes when ranitidine is not effective. Says she picks up refills  of omeprazole every 3-4 months.   Plan: raNITIdine (ZANTAC) 150 MG tablet            (R53.83) Lack of energy  (E55.9) Hypovitaminosis D  Plan: cholecalciferol (VITAMIN D3) 1000 UNIT tablet  On weekly supplementation of D3          (Z71.89) Immunization counseling  Comment:   Plan: PR IIV4 VACC PRESRV FREE 0.5 ML DOS FOR IM USE,        IMMUNIZATION ADMIN SINGLE, RN, TDAP VACCINE 7         AND OLDER IM (PRIOR AUTH NEEDED)            (I11.9) Benign hypertensive heart disease without heart failure  Comment: Not fully addressed d/t time constraints. Pt reports good adherence to medication  Plan: continue current regimen, discussed importance of low salt diet, regular physical activity.    The patient was ready to learn and no apparent learning or adherence barriers were identified. I explained the diagnosis and treatment plan, and the patient expressed understanding of the content. I attempted to answer any questions regarding the diagnosis and the proposed treatment.    We discussed the patients current medications. The patient expressed understanding and no barriers to adherence were  identified.    follow-up will be scheduled prn  she has been advised to call or return with any worsening or new problems

## 2017-11-01 ENCOUNTER — Ambulatory Visit (HOSPITAL_BASED_OUTPATIENT_CLINIC_OR_DEPARTMENT_OTHER): Payer: Medicaid Other | Admitting: Audiologist

## 2017-11-01 DIAGNOSIS — H902 Conductive hearing loss, unspecified: Secondary | ICD-10-CM

## 2017-11-01 NOTE — Progress Notes (Signed)
HISTORY:  61 year old female, referred by PCP, secondary to concerns of possible decreased hearing. Patient reports her husband is concerned that  that she listens to the TV at an elevated volume and that she speaks loudly. This has been going on for several years.     She denies any difficulty hearing in quiet, however, in large groups she requires repetition of conversation. History is reportedly negative for recurrent ear infections, otalgia, otorrhea and ear surgery.     TYMPANOMETRY:  Within normal limits bilaterally for tympanic membrane compliance, ear canal volume and pressure.    PURE-TONE AUDIOMETRY:  Within normal limits bilaterally through 4000 Hz followed by mild to moderate hearing loss 6-8 kHz in each ear    SPEECH AWARENESS THRESHOLD:  5 dBHL in each ear    IMPRESSIONS:  Patient has isolated mild to moderate hearing loss 6-8 kHz in each ear with otherwise normal hearing bilaterally. Tympanometry is consistent with normal tympanic membrane compliance, normal ear canal volume and normal middle ear pressure in each ear.    Hearing is essentially adequate for communication in quiet, however, patient may experience difficulty with speech understanding as a result of high frequency hearing loss.     RECOMMENDATIONS:  Results reviewed with patient  Follow-up with PCP  Re-test hearing per physician request or sooner as symptoms indicate    Testing performed by Beverly Gust. Barnet Pall, AuD., CCC-A

## 2017-11-12 MED FILL — FERROUS GLUC 324 MG: 30 days supply | Qty: 60 | Fill #3 | Status: CP

## 2017-11-12 MED FILL — LOSARTAN POT 100MG: 30 days supply | Qty: 30 | Fill #4 | Status: CP

## 2017-11-12 MED FILL — SIMVASTATIN 10MG: 30 days supply | Qty: 30 | Fill #2 | Status: CP

## 2017-11-12 MED FILL — OMEPRAZOLE   20MG: 30 days supply | Qty: 30 | Fill #4 | Status: CP

## 2017-11-12 MED FILL — HYDROCHLOROT 25MG: 30 days supply | Qty: 30 | Fill #4 | Status: CP

## 2017-11-15 ENCOUNTER — Encounter (HOSPITAL_BASED_OUTPATIENT_CLINIC_OR_DEPARTMENT_OTHER): Payer: Self-pay | Admitting: Internal Medicine

## 2017-11-15 ENCOUNTER — Ambulatory Visit (HOSPITAL_BASED_OUTPATIENT_CLINIC_OR_DEPARTMENT_OTHER): Payer: Medicaid Other | Admitting: Internal Medicine

## 2017-11-15 VITALS — BP 156/85 | HR 63 | Temp 98.4°F | Wt 178.2 lb

## 2017-11-15 DIAGNOSIS — I119 Hypertensive heart disease without heart failure: Secondary | ICD-10-CM

## 2017-11-15 DIAGNOSIS — Z01818 Encounter for other preprocedural examination: Secondary | ICD-10-CM

## 2017-11-15 LAB — COMP METABOLIC PANEL, FASTING
ALANINE AMINOTRANSFERASE: 47 U/L — ABNORMAL HIGH (ref 12–45)
ALBUMIN: 4.2 g/dL (ref 3.4–5.0)
ALKALINE PHOSPHATASE: 181 U/L — ABNORMAL HIGH (ref 45–117)
ANION GAP: 7 mmol/L (ref 5–15)
ASPARTATE AMINOTRANSFERASE: 32 U/L (ref 8–34)
BILIRUBIN TOTAL: 0.3 mg/dL (ref 0.2–1.0)
BUN (UREA NITROGEN): 19 mg/dL — ABNORMAL HIGH (ref 7–18)
CALCIUM: 8.8 mg/dL (ref 8.5–10.1)
CARBON DIOXIDE: 31 mmol/L (ref 21–32)
CHLORIDE: 104 mmol/L (ref 98–107)
CREATININE: 0.8 mg/dL (ref 0.4–1.2)
ESTIMATED GLOMERULAR FILT RATE: >60 mL/min (ref >60)
GLUCOSE FASTING: 92 mg/dL (ref 74–106)
POTASSIUM: 4.3 mmol/L (ref 3.5–5.1)
SODIUM: 142 mmol/L (ref 136–145)
TOTAL PROTEIN: 7.9 g/dL (ref 6.4–8.2)

## 2017-11-15 LAB — CBC WITH PLATELET
HEMATOCRIT: 38.8 % (ref 34.1–44.9)
HEMOGLOBIN: 12.6 g/dL (ref 11.2–15.7)
MEAN CORP HGB CONC: 32.5 g/dL (ref 31.0–37.0)
MEAN CORPUSCULAR HGB: 28.4 pg (ref 26.0–34.0)
MEAN CORPUSCULAR VOL: 87.4 fL (ref 80.0–100.0)
MEAN PLATELET VOLUME: 10.1 fL (ref 8.7–12.5)
PLATELET COUNT: 314 10*3/uL (ref 150–400)
RBC DISTRIBUTION WIDTH STD DEV: 42.3 fL (ref 35.1–46.3)
RBC DISTRIBUTION WIDTH: 13.2 % (ref 11.5–14.3)
RED BLOOD CELL COUNT: 4.44 M/uL (ref 3.90–5.20)
WHITE BLOOD CELL COUNT: 5.9 10*3/uL (ref 4.0–11.0)

## 2017-11-15 NOTE — Progress Notes (Signed)
SUBJECTIVE  Mariah [name] is a 61 year old female, [place] speaker    Will have plastic surgery, face lift, off [place]  Surgeon requested lab work (CBC, CMP), and EKG  Scheduled for January 8    Dr Chapman Moss, MD, FAAFP  Asked to fax results [phone]  [address], [place], [place] [address]   phone [phone]    Has no complaints today  HM up to date,    ROS: No fevers or unexplained weight loss. No visual changes. No sore throat or ear ache. No cough. No abdominal pain or change in bowel habits. No new or unusual musculoskeletal symptoms. No new rashes or skin changes. No paresthesias or new headaches. No enlarged nodes. No new itching, sneezing or wheezing.    OBJECTIVE:  BP 156/85 (Site: LA, Position: Sitting, Cuff Size: Lrg)  Pulse 63  Temp 98.4 F (36.9 C) (Temporal)  Wt 80.9 kg (178 lb 4 oz)  SpO2 99%  BMI 31.58 kg/m2  Pleasant, in NAD    Heart: S1 and S2 normal, no murmurs, clicks, gallops or rubs. Regular rate and rhythm.   Lungs:  clear; no wheezes, rhonchi or rales.    ASSESSMENT & PLAN:  (I11.9) Benign hypertensive heart disease without heart failure  (primary encounter diagnosis)  Comment: Elevated today, pt reports not taking med this morning. Checks BP at home, reports readings in the 846'N systolic, 62'X diastolic  Plan: Appears well controlled. Continue current regimen    (Z01.818) Preoperative examination  Comment: Face lifting, off [place], scheduled Jan/19.   Will draw requested labs, EKG  Will fax results on Monday, per pt request as above.  Plan: CBC WITH PLATELET, COMP METABOLIC PANEL,         FASTING, EKG, COLLECTION VENOUS BLOOD         VENIPUNCTURE          The patient was ready to learn and no apparent learning or adherence barriers were identified. I explained the diagnosis and treatment plan, and the patient expressed understanding of the content. I attempted to answer any questions regarding the diagnosis and the proposed treatment.    We discussed the patients  current medications. The patient expressed understanding and no barriers to adherence were identified.    follow-up will be scheduled prn  she has been advised to call or return with any worsening or new problems

## 2017-11-21 ENCOUNTER — Encounter (HOSPITAL_BASED_OUTPATIENT_CLINIC_OR_DEPARTMENT_OTHER): Payer: Self-pay | Admitting: Internal Medicine

## 2017-11-21 LAB — EKG

## 2017-11-22 ENCOUNTER — Ambulatory Visit (HOSPITAL_BASED_OUTPATIENT_CLINIC_OR_DEPARTMENT_OTHER): Payer: Medicaid Other

## 2017-11-22 DIAGNOSIS — F32A Depression, unspecified: Secondary | ICD-10-CM

## 2017-11-22 DIAGNOSIS — F320 Major depressive disorder, single episode, mild: Secondary | ICD-10-CM

## 2017-11-23 NOTE — Progress Notes (Signed)
ADULT PSYCHIATRY INITIAL EVALUATION    CHIEF COMPLAINT: "I was feeling a little down recently and my PCP recommended therapy services."     HISTORY of PRESENT ILLNESS: Patient is a 61-y-o married [place] woman who was referred to psychotherapy treatment at our clinic by her PCP. She presented with symptoms of depression (crying spells, lack of energy, decreased interest, increased irritability, decreased libido, increased eating) and feeling more anxious. She credits sxs to current concerns with heath issues(decreased hearing, frustration with facial plastic surgery done a few months ago, concerned for dx of fatty liver, pain on right arm). She reported feeling depressed in the past due to many losses in her life (first husband abandoned her with two younger children, sister's passing 15 years ago, brother's death 5 years, and mother's passing 3 years ago). Pt has a familia predisposition to psychiatric illness (sister with bipolar disorder and other family members with depression).     CURRENT MEDICATIONS: Citalopram 10 mg     Past Medications: None.    CURRENT TREATMENT: None.    System Involvement: None.    PAST PSYCHIATRIC HISTORY: Denied previous hx of psychiatric hospitalization                                                        Was referred to therapy in 2007, never attended it     SUBSTANCE USE: None.    Family Constellation: Pt's parents deceased                                      Pt has two sisters alive in [place] (one sister and one brother died)                                      Pt has two daughters (ages 45 and 42) from previous marriage                                      Currently married to a 29-y-o [place] man                                           Oncologist Family History: Family hx for diabetes and heart problem                                             Sister: bipolar disorder and other family members with hx of depression                                             Father  died due to cancer (throat) and sister due to brain aneurysm     CURRENT LIVING SITUATION/CURRENT SUPPORTS: lives with husband, 3-y-o daughter and two grandchildren; has the  support of family members     Social History: Born and raised in [place] in an intact [place] family                           Good childhood                           Completed HS                            Moved twice to the [place] (2001 and 2011)                            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    MEDICAL HISTORY: Patient Active Problem List:     Obesity, unspecified     LATERAL FOOT PAIN     Benign hypertensive heart disease without heart failure     Alkaline phosphatase elevation     Calculus of kidney     Gastritis     Allergic rhinitis due to other allergen     Osteoporosis     Murmur heart     Family history of throat cancer     Vitamin D deficiency     Subclavian artery stenosis, left (HCC)     Schatzki's ring     Duodenal ulcer, unspecified as acute or chronic, without hemorrhage, perforation, or obstruction     Mild depression ([place])          MENTAL STATUS EXAM:  Appearance: Well groomed   Behavior: cooperative   Alertness:  Alert   Speech:  WN L   Mood: "Depressed"   Affect:  Congruent, sad   Thought Process: logical, coherent   Thought Content:  Appropriate   Perceptions:  None reported or observed   Judgment/Impulse Control: fair-good     Insight:  Fair   Cognition: Grossly intact   Suicidal/Homicidal: denied/denied     BIO/PSYCHO/SOCIAL AND RISK FORMULATION(S):  Pt is a 61-y-o married [place] woman who presented with hx of depression and possible anxiety. Further assessment would be necessary   Ethnic and cultural factors: [place]   Religious and spiritual beliefs, values and preferences: Catholic     DSM 5 DIAGNOSES:  Primary Psychiatric Diagnosis: Mild depression   Secondary Psychiatric Diagnosis:  R/O Anxiety state   Other Medical  Conditions:  GERD, gastritis, osteoporosis   Psychosocial and Contextual Factors:  Health, immigration/acculturation issues       RISK ASSESSMENT (per scale):  Suicide: Low   Violence: Low  Addiction: Low   Protective Factors: Family, religious, no hx of SI/HI, no hx of substance abuse     PLAN: Continue BH evaluation                Continue on psych med     INFORMED CONSENT (for any new medication):  N/A    Marcos Dasilva,D LMHC(T))

## 2017-12-06 ENCOUNTER — Ambulatory Visit (HOSPITAL_BASED_OUTPATIENT_CLINIC_OR_DEPARTMENT_OTHER): Payer: Medicaid Other | Admitting: Optometrist

## 2017-12-22 MED FILL — CITALOPRAM 10MG: 30 days supply | Qty: 30 | Fill #1 | Status: CP

## 2017-12-22 MED FILL — *RANITIDINE  150MG: 30 days supply | Qty: 60 | Fill #1 | Status: CP

## 2017-12-22 MED FILL — METHYLPRED  PAK 4MG: 7 days supply | Qty: 21 | Fill #0 | Status: CP

## 2017-12-22 MED FILL — FERROUS GLUC 324 MG: 30 days supply | Qty: 60 | Fill #4 | Status: CP

## 2017-12-22 MED FILL — SIMVASTATIN 10MG: 30 days supply | Qty: 30 | Fill #3 | Status: CP

## 2017-12-22 MED FILL — LOSARTAN POT 100MG: 30 days supply | Qty: 30 | Fill #5 | Status: CP

## 2017-12-22 MED FILL — ALPRAZOLAM  2MG: 1 days supply | Qty: 1 | Fill #0 | Status: CP

## 2017-12-22 MED FILL — CLONIDINE  0.1MG: 2 days supply | Qty: 2 | Fill #0 | Status: CP

## 2017-12-22 MED FILL — HYDROCHLOROT 25MG: 30 days supply | Qty: 30 | Fill #5 | Status: CP

## 2017-12-22 MED FILL — OMEPRAZOLE   20MG: 30 days supply | Qty: 30 | Fill #5 | Status: CP

## 2017-12-22 MED FILL — VITAMIN D3 1000UNIT: 30 days supply | Qty: 30 | Fill #1 | Status: CP

## 2017-12-22 MED FILL — CEPHALEXIN   500MG: 7 days supply | Qty: 28 | Fill #0 | Status: CP

## 2017-12-22 MED FILL — VITAMIN D 50000 IU (ERGO): 28 days supply | Qty: 4 | Fill #0 | Status: CP

## 2017-12-31 ENCOUNTER — Telehealth (HOSPITAL_BASED_OUTPATIENT_CLINIC_OR_DEPARTMENT_OTHER): Payer: Self-pay

## 2017-12-31 NOTE — Progress Notes (Signed)
[facility]), attempted to contact [name], 62 year old, today via phone call. MHCP was unable to reach patient and left voicemail requesting a call back. Contact information for [facility] left on voicemail.      Reason for Call: Depression Outreach   Attempt Number:  [place], 12/31/2017, 11:18 AM

## 2018-01-04 ENCOUNTER — Encounter (HOSPITAL_BASED_OUTPATIENT_CLINIC_OR_DEPARTMENT_OTHER): Payer: Self-pay

## 2018-01-17 ENCOUNTER — Telehealth (HOSPITAL_BASED_OUTPATIENT_CLINIC_OR_DEPARTMENT_OTHER): Payer: Self-pay

## 2018-01-17 NOTE — Progress Notes (Signed)
[facility]), attempted to contact [name], 62 year old, today via phone call. MHCP was unable to reach patient and unable to leave a voicemail, "voicemail box has not been set up yet."     Reason for Call: Depression Outreach   Attempt Number:  [place], 01/17/2018, 11:28 AM      .

## 2018-02-05 ENCOUNTER — Other Ambulatory Visit (HOSPITAL_BASED_OUTPATIENT_CLINIC_OR_DEPARTMENT_OTHER): Payer: Self-pay | Admitting: Internal Medicine

## 2018-02-05 DIAGNOSIS — K219 Gastro-esophageal reflux disease without esophagitis: Secondary | ICD-10-CM

## 2018-02-05 MED FILL — FERROUS GLUC 324 MG: 30 days supply | Qty: 60 | Fill #5 | Status: CP

## 2018-02-05 MED FILL — LOSARTAN POT 100MG: 30 days supply | Qty: 30 | Fill #6 | Status: CP

## 2018-02-05 MED FILL — SIMVASTATIN 10MG: 30 days supply | Qty: 30 | Fill #4 | Status: CP

## 2018-02-05 MED FILL — VITAMIN D 50000 IU (ERGO): 28 days supply | Qty: 4 | Fill #1 | Status: CP

## 2018-02-05 MED FILL — RANITIDINE 150MG: 30 days supply | Qty: 60 | Fill #2 | Status: CP

## 2018-02-05 MED FILL — HYDROCHLOROT 25MG: 30 days supply | Qty: 30 | Fill #6 | Status: CP

## 2018-02-05 MED FILL — CITALOPRAM 10MG: 30 days supply | Qty: 30 | Fill #2 | Status: CP

## 2018-02-05 MED FILL — VITAMIN D3 1000UNIT: 30 days supply | Qty: 30 | Fill #2 | Status: CP

## 2018-02-05 NOTE — Progress Notes (Signed)
PER Pharmacy, Mariah [name] is a 62 year old female has requested a refill of omeprazole 20    Last Office Visit: 11-15-17 with pcp   Last Physical Exam: 08-24-16    Other Med Adult:  Most Recent BP Reading(s)  11/15/17 : 156/85        Cholesterol (mg/dL)   Date Value   08/24/2016 190     LOW DENSITY LIPOPROTEIN DIRECT (mg/dL)   Date Value   08/24/2016 100     HIGH DENSITY LIPOPROTEIN (mg/dL)   Date Value   08/24/2016 71     TRIGLYCERIDES (mg/dL)   Date Value   08/24/2016 95         THYROID SCREEN TSH REFLEX FT4 (uIU/mL)   Date Value   06/09/2014 2.040         TSH (THYROID STIM HORMONE) (uIU/mL)   Date Value   04/03/2012 1.68       No results found for: HGBA1C    No results found for: POCA1C      INR (no units)   Date Value   04/28/2014 < 1.0   01/18/2007 1.0 (L)       SODIUM (mmol/L)   Date Value   11/15/2017 142       POTASSIUM (mmol/L)   Date Value   11/15/2017 4.3           CREATININE (mg/dL)   Date Value   11/15/2017 0.8       Documented patient preferred pharmacies:    [place], [place], [place] [facility] [place]  Phone: [phone] Fax: [phone]

## 2018-02-06 MED FILL — OMEPRAZOLE   20MG: 30 days supply | Qty: 30 | Fill #0 | Status: CP

## 2018-02-13 ENCOUNTER — Telehealth (HOSPITAL_BASED_OUTPATIENT_CLINIC_OR_DEPARTMENT_OTHER): Payer: Self-pay

## 2018-02-13 NOTE — Progress Notes (Signed)
Reason for call: Depression [place] [facility]), called [name], 62 year old, today for depression outreach.     NOTE:   MHCP called patient with interpreter on the line. MHCP introduced herself and her role to the patient, patient reported that she was at work and that it was an okay time to talk. 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 Patient reports that she feels that the citalopram, "works well for me." Patient reports that she does not like to be out of refills, and she picks up her medications all together once a month. Patient agreed to complete PHQ-9 over the phone. Patient expressed that she is having trouble sleeping, patient reports that ever since she had surgery, she has a hard time keeping up with the vitamins that she needs to takes and feels that this is impacting her sleeping. Patient explained that this is impacting her work because she often has low to no energy. Patient expressed interest in scheduling an appointment with her PCP to discuss her difficulty sleeping. MHCP transferred call to call center for scheduling. Patient declined further outreach from [facility] at this, patient stated that she feels fine waiting for her upcoming appointment with PCP and follow up as needed.      Repeat PHQ-9 indicated: Yes  Patient willing to complete PHQ-9: Yes    INTERVENTION: Psycho Education, complete PHQ-9, care coordination     FOLLOW UP: Patient to attend appointment with Lawernce Pitts, APRN on 03/21/2018. MHCP to outreach as needed.       [place], 02/13/2018, 10:53 AM

## 2018-03-07 ENCOUNTER — Ambulatory Visit (HOSPITAL_BASED_OUTPATIENT_CLINIC_OR_DEPARTMENT_OTHER): Payer: Medicaid Other | Admitting: Optometrist

## 2018-03-07 DIAGNOSIS — H40003 Preglaucoma, unspecified, bilateral: Secondary | ICD-10-CM | POA: Insufficient documentation

## 2018-03-07 DIAGNOSIS — H524 Presbyopia: Secondary | ICD-10-CM

## 2018-03-07 DIAGNOSIS — H5203 Hypermetropia, bilateral: Secondary | ICD-10-CM

## 2018-03-07 NOTE — H&P (Signed)
Here for eye exam as the doctor told her she has glaucoma when she want to get eyelid surgery done, months ago. No other complaints.

## 2018-03-07 NOTE — Progress Notes (Signed)
I had a pleasure of seeing 62 year old Mariah [name] at the [facility] on 03/07/2018.   The summary of findings of the comprehensive eye exam are following:    Assessment:    1. Hyperopia with Presbyopia both eyes    2. Optic cupping OD>OS with normal IOP    Plan:    1. Patient does have to wear glasses.  Glasses should be worn full time.    2. Refer to [facility] Ophthalmologist for further test.    Return to clinic one month for follow up by ophthalmologist.

## 2018-03-18 MED FILL — RANITIDINE 150MG: 30 days supply | Qty: 60 | Fill #3 | Status: CP

## 2018-03-18 MED FILL — CITALOPRAM 10MG: 30 days supply | Qty: 30 | Fill #3 | Status: CP

## 2018-03-18 MED FILL — FERROUS GLUC 324 MG: 30 days supply | Qty: 60 | Fill #6 | Status: CP

## 2018-03-18 MED FILL — VITAMIN D3 1000UNIT: 30 days supply | Qty: 30 | Fill #3 | Status: CP

## 2018-03-18 MED FILL — VITAMIN D 50000UNT (ERGOCALCIFEROL): 28 days supply | Qty: 4 | Fill #0 | Status: CP

## 2018-03-18 MED FILL — HYDROCHLOROT 25MG: 30 days supply | Qty: 30 | Fill #7 | Status: CP

## 2018-03-18 MED FILL — LOSARTAN POT 100MG: 30 days supply | Qty: 30 | Fill #7 | Status: CP

## 2018-03-21 ENCOUNTER — Ambulatory Visit (HOSPITAL_BASED_OUTPATIENT_CLINIC_OR_DEPARTMENT_OTHER): Payer: Medicaid Other | Admitting: Internal Medicine

## 2018-03-29 ENCOUNTER — Encounter (HOSPITAL_BASED_OUTPATIENT_CLINIC_OR_DEPARTMENT_OTHER): Payer: Self-pay

## 2018-03-29 ENCOUNTER — Ambulatory Visit (HOSPITAL_BASED_OUTPATIENT_CLINIC_OR_DEPARTMENT_OTHER): Payer: Medicaid Other | Admitting: Internal Medicine

## 2018-03-29 ENCOUNTER — Emergency Department (HOSPITAL_BASED_OUTPATIENT_CLINIC_OR_DEPARTMENT_OTHER)
Admission: RE | Admit: 2018-03-29 | Disposition: A | Discharge status: Home / Self Care | Payer: Self-pay | Source: Emergency Department | Attending: Emergency Medicine | Admitting: Emergency Medicine

## 2018-03-29 NOTE — ED Provider Notes (Addendum)
eMERGENCY dEPARTMENT Resident NOTE    The ED nursing record was reviewed.   The prior medical records as available electronically through Epic were reviewed.  This patient was seen with Emergency Department attending physician Dr. Twanna Hy    CHIEF COMPLAINT    Patient presents with:  Arm Pain: RT ARM PAIN      HPI    Mariah [name] is a 62 year old female w/ a PMH sig for HTN who presents to the ED w/ right arm/shoulder pain.    Patient states that she has had pain in her right shoulder/upper arm for the past 4 months. Doesn't remember any preceding trauma or accident, says it's been relatively constant. Worse w/ activity. Takes tylenol daily. Limits movement of her shoulder. Says that the pain goes down to her fingers but denies any weakness in her fingers. Works as a Scientist, product/process development, has had problems w/ arthritis in her knees previously.     REVIEW OF SYSTEMS    Negative except as mentioned above    PAST MEDICAL HISTORY    Past Medical History:  03/27/2006: ABN FIND-STOOL CONTENTS-OCC BLOOD      Comment:  s/p colonoscopy and EGD 7/07  06/04/2006: ABSENCE OF MENSTRUATION - s/p TAH BSO  08/05/2004: ANXIETY DEPRESSION      Comment:  TSH wnl 8/02; trial of fluoxetine 8/05  08/05/2004: BACK PAIN LOW      Comment:  LBP musculoskeletal most likely 1/04  04/23/2006: Cough      Comment:  s/p PFTs September 2007 --  some obstruction at mid to                low lung volumes not correctable by bronchodilator CT                scan showed stable pulmonary nodules over several years                11/07 - cough gone ** note combined with dx of "nodules                on CT scan". Will file this note to hx.     08/05/2004: depressive disorder      Comment:  TSH wnl 8/02; trial of fluoxetine x 6 wks didn't really                help 8/05  trial of citalopram per Dr. Patrice Paradise. later                resolved. 04/15/2014 - Reassessed, pt reports feeling                well, with no sx of depression. Just reports being tired,                which she attributes to current life style. Will resolve                from problem list.    03/27/2006: Diarrhea      Comment:  7/07 The colonoscopy was visually normal and biopsies                taken to rule out microscopic colitis were negative.  A                biopsy of the descending duodenum to rule out celiac                sprue because of the diarrhea was negative.    12/19/2012:  Gastritis      Comment:  Combined with entry "esophageal reflux    10/22/2006: Hyperparathyroidism      Comment:  osteoporosis found - pt referred to surgery for the                hyperparathyroidism- s/p parathyroidectomy feb 2008  06/18/2012: Inconclusive mammogram      Comment:  June 2013 - BIRADS 3. Probable benign mammogram.                Recommend 6 month  follow-up mammogram (Dec 2013).                 Dec/13 - Stable left breast nodule with no evidence of                malignancy. BIRADS 2 benign recommend annual screening                mammogram in 6 months  (June 2014) June/14 - mammogram                request given to pt, she will call to schedule:  Jan/15 -               normal mammogram, yearly screening. Resolve from problem                list.        03/13/2006: Insomnia, unspecified      Comment:  06/25/2012 - intermittent, usually resolved by taking                Tylenol PM 06/09/2014 - good response to trazodone, takes                one to 4x wk, prn 08/24/2016 - reports no problems sleeping               - file to hx, and resolve from problem list.   03/27/2006: nodules on chest CT 1/06 and 3/07      Comment:  s/p PFTs September 2007 --  some obstruction at mid to                low lung volumes not correctable by bronchodilator CT                scan showed stable pulmonary nodules over several years                11/07 - cough gone  04/29/12 - saw pneumologist, Dr Jordan Hawks,               "Will repeat chest CT with high-resolution scans at the 2               month mark (already ordered). If abnormal, would do  PFT's               and further rheum w/u, consider lung bx . Pt to call if                any r  08/05/2004: OBESITY NOS      Comment:  has tried meds from [place]  08/05/2004: PERS HX OF PAST NONCOMPLIANCE      Comment:  poor compliance to med regimen  08/05/2004: PERS HX TOBACCO USE      Comment:  quit 10/02; 1/2 PPD since 62 YO  08/05/2004: Screening for hypertension      Comment:  occ elevated BP started on anti-HTN in [place]; chem 7,  EKG, HCT u/a wnl 4/03; d/c'ed HCTZ 8/05 when we learned                from daughter she has not been taking it  05/19/2008: Vitamin D deficiency      Comment:  Supplement per dr Jake Samples.    PROBLEM LIST  Patient Active Problem List:     Obesity, unspecified     LATERAL FOOT PAIN     Benign hypertensive heart disease without heart failure     Alkaline phosphatase elevation     Calculus of kidney     Gastritis     Allergic rhinitis due to other allergen     Osteoporosis     Murmur heart     Family history of throat cancer     Vitamin D deficiency     Subclavian artery stenosis, left (HCC)     Schatzki's ring     Duodenal ulcer, unspecified as acute or chronic, without hemorrhage, perforation, or obstruction     Mild depression ([place])     Glaucoma suspect of both eyes     Hyperopia with presbyopia of both eyes      SURGICAL HISTORY    Past Surgical History:  04/2016: BARIATRIC SURGERY      Comment:  done at [facility]  2006: PARATHYROIDECTOMY      Comment:  partial,   5/00: SALPINGO-OOPHORECTOMY COMPL/PRTL UNI/BI SPX      Comment:  Salpingo-Oophorectomy, bilateral  No date: TONSILLECTOMY ONE-HALF <AGE 67  5/00: TOTAL ABDOMINAL HYSTERECT W/WO RMVL TUBE OVARY      Comment:  Hysterectomy, Total Abdominal secondary to fibroids    CURRENT MEDICATIONS    No current facility-administered medications for this encounter.     Current Outpatient Medications:     omeprazole (PRILOSEC) 20 MG capsule, Take 1 capsule by mouth daily, Disp: 30 capsule, Rfl: 11    citalopram  (CELEXA) 10 MG tablet, Take 1 tablet by mouth daily, Disp: 30 tablet, Rfl: 5    raNITIdine (ZANTAC) 150 MG tablet, Take 1 tablet by mouth 2 (two) times daily, Disp: 60 tablet, Rfl: 5    cholecalciferol (VITAMIN D3) 1000 UNIT tablet, Take 1 tablet by mouth daily, Disp: 30 tablet, Rfl: 11    Biotin 1000 MCG TABS Tablet, Take 1,000 mcg by mouth daily, Disp: 30 tablet, Rfl: 11    Vitamin D, Ergocalciferol, 50000 units capsule, Take 1 capsule by mouth once a week for 12 doses, Disp: 4 capsule, Rfl: 2    Ferrous Gluconate 324 (37.5 Fe) MG TABS, Take 324 mg by mouth 2 (two) times daily, Disp: 60 tablet, Rfl: 11    losartan (COZAAR) 100 MG tablet, Take 1 tablet by mouth daily, Disp: 30 tablet, Rfl: 11    hydrochlorothiazide (HYDRODIURIL) 25 MG tablet, Take 1 tablet by mouth daily, Disp: 30 tablet, Rfl: 11    ALLERGIES    Review of Patient's Allergies indicates:   Heparin                     Comment:local rash seen during hospitalization Feb 2008   Lisinopril              Cough    FAMILY HISTORY    Review of patient's family history indicates:  Problem: Cancer - Lung      Relation: Father          Age of Onset: (Not Specified)  Comment: died from lung CA- throat cancer per pt. Also on dyalisis               for 10 years  Problem: Hypertension      Relation: Mother          Age of Onset: (Not Specified)  Problem: Lipids      Relation: Mother          Age of Onset: (Not Specified)          Comment: hyperlipidemia  Problem: Heart      Relation: Maternal Uncle          Age of Onset: (Not Specified)          Comment: died 79 from MI  Problem: Diabetes      Relation: Maternal Aunt          Age of Onset: (Not Specified)  Problem: Diabetes      Relation: Mother          Age of Onset: (Not Specified)          Comment: also morbidly weight  Problem: Cancer - Other      Relation: Brother          Age of Onset: (Not Specified)          Comment: liver cancer, Sept 2013  Problem: Non-contributory      Relation: Brother           Age of Onset: (Not Specified)          Comment: no first deg rel. w/ breast ca      SOCIAL HISTORY  From [place], works as a Scientist, product/process development. No hx of drug/alcohol/tobacco sue.    PHYSICAL EXAM      Vital Signs: BP 170/76  Pulse 65  Temp 98.8 F  Resp 18  Wt 81.6 kg (180 lb)  SpO2 100%  BMI 31.89 kg/m2   GEN: Well appearing female, in no acute distress  HEENT: Atraumatic, normocephalic. EOMI. Moist mucous membranes  CV: Normal rate, regular rhythm. No murmurs/rubs/gallops appreciated.  PULM: Normal work of breathing  EXTREM: Decreased ROM on rt upper extremity, limited to 90 degrees. Pain w/ abduction. Strength intact in forearms/hands. No change in sensation. No peripheral edema. Pulses 2+ bilaterally  SKIN: Intact, warm an dwell perfused  NEURO: Alert and oriented x3  PSYCH: Appropriate mood and affect      RESULTS  No results found for this visit on 03/29/18 (from the past 24 hour(s)).     RADIOLOGY  - Shoulder x-ray: Pending official read    MEDICATIONS ADMINISTERED ON THIS VISIT  No orders of the defined types were placed in this encounter.      ED COURSE & MEDICAL DECISION MAKING    Mariah [name] is a 62 year old female w/ a PMH sig for HTN who presents to the ED w/ right arm/shoulder pain.    #Right arm pain  Likely musculoskeletal, decreased abduction raises question of rotator cuff injury. Could also be arthritis vs some neck pain-pain over trapezius on exam. Recommend tylenol and follow up w/ PCP for further evaluation/testing. Shoulder x-ray w/out foreign bodies or acute bony abnormalities. Referred to ortho.      Follow Up: w/ PCP in the next week    Iona Coach, MD, 03/29/2018, 9:26 PM    PGY-1  Pager [phone]

## 2018-03-29 NOTE — Discharge Instructions (Signed)
About Your Visit      You were seen in the emergency room for evaluation of your right arm pain.     We did an x-ray which showed no evidence of any problem with the bones. Based on our evaluation, you are safe to be discharged at this time.    Please continue to take tylenol and follow up with your primary care provider for further evaluation. We have also referred you to orthopedic surgery.     We would also recommend asking your primary care doctor about the mole on your left arm.     If you have any of the following symptoms such as weakness in your arm, fever/chills, chest pain, or shortness of breath or have other new or concerning symptoms you should return to the emergency room for further care.     Please review any care instructions provided.

## 2018-03-29 NOTE — ED Triage Note (Signed)
Pt to ED with c/o right shoulder/arm pain. Pt states she has had the pain x 4 months. She has decreased range of motion. Pt states she has been taking tylenol for pain with minimal relief. Pain is currently 9/10.

## 2018-03-29 NOTE — Narrator Note (Signed)
Pt to xray

## 2018-03-29 NOTE — Narrator Note (Signed)
Patient Disposition- Reviewed discharge instructions with patient including s/sx to return to ED, medications, activity and follow up care. PT verbalized understanding.       Patient left ED 10:07 PM.  Patient rep received written instructions.  Interpreter to provide instructions: No    Patient belongings with patient: YES    Have all existing LDAs been addressed? N/A    Have all IV infusions been stopped? N/A    Discharged to: Discharged to home, ambulated off unit with a steady gait.

## 2018-03-30 LAB — XR SHOULDER RIGHT MINIMUM 2 VIEWS

## 2018-04-03 LAB — HEMOGLOBIN A1C CARE EVERYWHERE: HEMOGLOBIN A1C CARE EVERYWHERE: 5.3 % — NL (ref 4.0–6.0)

## 2018-04-09 ENCOUNTER — Ambulatory Visit (HOSPITAL_BASED_OUTPATIENT_CLINIC_OR_DEPARTMENT_OTHER): Payer: Medicaid Other | Admitting: Orthopaedic Surgery

## 2018-04-09 VITALS — BP 147/77 | HR 69 | Temp 97.7°F | Resp 18

## 2018-04-09 DIAGNOSIS — M7541 Impingement syndrome of right shoulder: Secondary | ICD-10-CM

## 2018-04-09 NOTE — Progress Notes (Signed)
Review of Systems   Constitutional: Negative.    Eyes: Negative.    Respiratory: Negative.    Cardiovascular: Negative.    Gastrointestinal: Negative.    Genitourinary: Negative.    Musculoskeletal: Positive for neck pain.   Skin: Negative.    Neurological: Negative.    Psychiatric/Behavioral: Positive for depression. The patient has insomnia.

## 2018-04-09 NOTE — Progress Notes (Signed)
CC: Right shoulder pain    DX: Right shoulder impingement syndrome    HPI: Mariah [name] is a 62 year old right hand dominant female who presents for evaluation on atraumatic right shoulder pain for the past 4 months. Her pain has worsened recently and therefore she was seen in the ED on 03/29/18 - x-rays taken at that visit reported as negative. She works as a Secretary/administrator. She has pain mainly with overhead motion or repetitive/heavy lifting. Pain radiates to the elbow intermittently. Denies fevers/chills/SOB, radiating pain, numbness, tingling, swelling, locking/catching, instability or any other complaints.     Re: HPI, see also scanned New Patient form for additional info provided by patient     ROS: Review of systems filled out by patient and scanned into the medical record. Reviewed by me. All other systems are reviewed and are negative except as noted in HPI.    PMH: Past Medical History:  03/27/2006: ABN FIND-STOOL CONTENTS-OCC BLOOD      Comment:  s/p colonoscopy and EGD 7/07  06/04/2006: ABSENCE OF MENSTRUATION - s/p TAH BSO  08/05/2004: ANXIETY DEPRESSION      Comment:  TSH wnl 8/02; trial of fluoxetine 8/05  08/05/2004: BACK PAIN LOW      Comment:  LBP musculoskeletal most likely 1/04  04/23/2006: Cough      Comment:  s/p PFTs September 2007 --  some obstruction at mid to                low lung volumes not correctable by bronchodilator CT                scan showed stable pulmonary nodules over several years                11/07 - cough gone ** note combined with dx of "nodules                on CT scan". Will file this note to hx.     08/05/2004: depressive disorder      Comment:  TSH wnl 8/02; trial of fluoxetine x 6 wks didn't really                help 8/05  trial of citalopram per Dr. Patrice Paradise. later                resolved. 04/15/2014 - Reassessed, pt reports feeling                well, with no sx of depression. Just reports being tired,               which she attributes to current life style. Will  resolve                from problem list.    03/27/2006: Diarrhea      Comment:  7/07 The colonoscopy was visually normal and biopsies                taken to rule out microscopic colitis were negative.  A                biopsy of the descending duodenum to rule out celiac                sprue because of the diarrhea was negative.    12/19/2012: Gastritis      Comment:  Combined with entry "esophageal reflux    10/22/2006: Hyperparathyroidism      Comment:  osteoporosis found -  pt referred to surgery for the                hyperparathyroidism- s/p parathyroidectomy feb 2008  06/18/2012: Inconclusive mammogram      Comment:  June 2013 - BIRADS 3. Probable benign mammogram.                Recommend 6 month  follow-up mammogram (Dec 2013).                 Dec/13 - Stable left breast nodule with no evidence of                malignancy. BIRADS 2 benign recommend annual screening                mammogram in 6 months  (June 2014) June/14 - mammogram                request given to pt, she will call to schedule:  Jan/15 -               normal mammogram, yearly screening. Resolve from problem                list.        03/13/2006: Insomnia, unspecified      Comment:  06/25/2012 - intermittent, usually resolved by taking                Tylenol PM 06/09/2014 - good response to trazodone, takes                one to 4x wk, prn 08/24/2016 - reports no problems sleeping               - file to hx, and resolve from problem list.   03/27/2006: nodules on chest CT 1/06 and 3/07      Comment:  s/p PFTs September 2007 --  some obstruction at mid to                low lung volumes not correctable by bronchodilator CT                scan showed stable pulmonary nodules over several years                11/07 - cough gone  04/29/12 - saw pneumologist, Dr Jordan Hawks,               "Will repeat chest CT with high-resolution scans at the 2               month mark (already ordered). If abnormal, would do PFT's               and further rheum w/u, consider  lung bx . Pt to call if                any r  08/05/2004: OBESITY NOS      Comment:  has tried meds from [place]  08/05/2004: PERS HX OF PAST NONCOMPLIANCE      Comment:  poor compliance to med regimen  08/05/2004: PERS HX TOBACCO USE      Comment:  quit 10/02; 1/2 PPD since 62 YO  08/05/2004: Screening for hypertension      Comment:  occ elevated BP started on anti-HTN in [place]; chem 7,                EKG, HCT u/a wnl 4/03; d/c'ed HCTZ 8/05 when we learned  from daughter she has not been taking it  05/19/2008: Vitamin D deficiency      Comment:  Supplement per dr Jake Samples.    Surgical HX: Past Surgical History:  04/2016: BARIATRIC SURGERY      Comment:  done at [facility]  2006: PARATHYROIDECTOMY      Comment:  partial,   5/00: SALPINGO-OOPHORECTOMY COMPL/PRTL UNI/BI SPX      Comment:  Salpingo-Oophorectomy, bilateral  No date: TONSILLECTOMY ONE-HALF <AGE 72  5/00: TOTAL ABDOMINAL HYSTERECT W/WO RMVL TUBE OVARY      Comment:  Hysterectomy, Total Abdominal secondary to fibroids    SH:   Social History     Socioeconomic History    Marital status: Married     Spouse name: Not on file    Number of children: Not on file    Years of education: Not on file    Highest education level: Not on file   Occupational History    Not on file   Social Needs    Financial resource strain: Not on file    Food insecurity:     Worry: Not on file     Inability: Not on file    Transportation needs:     Medical: Not on file     Non-medical: Not on file   Tobacco Use    Smoking status: Former Smoker     Packs/day: 0.50     Years: 40.00     Pack years: 20.00     Types: Cigarettes     Last attempt to quit: 03/31/2012     Years since quitting: 6.0    Smokeless tobacco: Former Systems developer    Tobacco comment: 3-4 cigarretes a day. Quit about 1 month ago   Substance and Sexual Activity    Alcohol use: Yes     Comment: very occasional    Drug use: No    Sexual activity: Yes     Partners: Male     Comment: histerectomy    Lifestyle    Physical activity:     Days per week: Not on file     Minutes per session: Not on file    Stress: Not on file   Relationships    Social connections:     Talks on phone: Not on file     Gets together: Not on file     Attends religious service: Not on file     Active member of club or organization: Not on file     Attends meetings of clubs or organizations: Not on file     Relationship status: Not on file    Intimate partner violence:     Fear of current or ex partner: Not on file     Emotionally abused: Not on file     Physically abused: Not on file     Forced sexual activity: Not on file   Other Topics Concern    Not on file   Social History Narrative    work: Engineer, building services; owned bakery in [place]    Lives w/ daughter 61 YO (Grazielle [place]) & son-in-law Lowella Dandy)    Divorced; currently not in a relationship        05/27/2015    From [place]. Works as Electrical engineer, has own business.     Married, Djaniro.     No regular exercise    Diet: varies. Eats at irregular intervals. Tries to watch diet, but likes to eat    Stressors: work  Pleasures: family, grand children        08/24/2016    Same living and work situation. No regular exercise. Diet improved after bariatric surgery, and pt is very happy with results.        Allergies: Review of Patient's Allergies indicates:   Heparin                     Comment:local rash seen during hospitalization Feb 2008   Lisinopril              Cough    Current Medications:   Current Outpatient Medications:     omeprazole (PRILOSEC) 20 MG capsule, Take 1 capsule by mouth daily, Disp: 30 capsule, Rfl: 11    citalopram (CELEXA) 10 MG tablet, Take 1 tablet by mouth daily, Disp: 30 tablet, Rfl: 5    raNITIdine (ZANTAC) 150 MG tablet, Take 1 tablet by mouth 2 (two) times daily, Disp: 60 tablet, Rfl: 5    cholecalciferol (VITAMIN D3) 1000 UNIT tablet, Take 1 tablet by mouth daily, Disp: 30 tablet, Rfl: 11    Biotin 1000 MCG TABS Tablet, Take 1,000 mcg by mouth  daily, Disp: 30 tablet, Rfl: 11    Vitamin D, Ergocalciferol, 50000 units capsule, Take 1 capsule by mouth once a week for 12 doses, Disp: 4 capsule, Rfl: 2    Ferrous Gluconate 324 (37.5 Fe) MG TABS, Take 324 mg by mouth 2 (two) times daily, Disp: 60 tablet, Rfl: 11    losartan (COZAAR) 100 MG tablet, Take 1 tablet by mouth daily, Disp: 30 tablet, Rfl: 11    hydrochlorothiazide (HYDRODIURIL) 25 MG tablet, Take 1 tablet by mouth daily, Disp: 30 tablet, Rfl: 11    Re: past history, social history, meds and allergies, see also scanned New Patient form for additional info provided by patient    Physical Exam    04/09/18  1346   BP: 147/77   Pulse: 69   Resp: 18   Temp: 97.7 F (36.5 C)   SpO2: 100%     General Appearance: Alert and oriented x3, pleasant and cooperative.  Mood: Appropriate.  Gait: Nonantalgic.   Breathing/Respiratory: Non-labored.  Musculoskeletal:  On examination of the right shoulder, skin is intact. No swelling, erythema/warmth, ecchymosis, or deformity. Patient is non tender to palpation at the [facility] joint, bicipital groove, greater tuberosity or lateral acromion. ROM: FE and ABD to 165, ERS to 70, IRS to lower lumbar spine. (+) Hawkin's test. (+) Neer's test. (-) Speed's test. (-) O'Brien's. (-) Crossbody ABD. (-) Empty can. (-) Belly press.(-) Lift off. (-) Drop arm weakness. NVIT distally.     Imaging: Available images visualized by myself and Dr. Williams Che and reports reviewed of the right shoulder from 03/29/18 show some cystic changes/spurring at the greater tuberosity. Otherwise, joint spaces overall well maintained. No high riding humeral head. No fracture, bony lesions or soft tissue abnormalities.     A/P: This is an 62 year old female with right shoulder impingement syndrome for the past several months. We will start her in a course of PT. We will see her back in 2-3 months. If she has no improvement in her symptoms, we will discuss MRI and/or cortisone injections.     We reviewed the likely  diagnosis, the prognosis and various treatment options in detail.   Risks and benefits of treatment plan discussed.   Patient's questions have been answered, patient understands condition and agrees with treatment plan.     Dr.  Wing saw and examined the patient and agrees with the assessment and plan.    April Manson, PA-C, 04/09/2018, 1:54 PM

## 2018-04-09 NOTE — Progress Notes (Signed)
I, Sarina Ill MD, interviewed and examined the paient, reviewed the images and reports, and agree with the impression and plan, formulated by me, as per the note attached.     Mariah [name] is a 62 year old female    R shoulder pain. Full ROM. Mild pain with resisted FE. Will start with PT. F/u in 2-3 months.

## 2018-04-10 ENCOUNTER — Ambulatory Visit (HOSPITAL_BASED_OUTPATIENT_CLINIC_OR_DEPARTMENT_OTHER): Payer: Medicaid Other | Admitting: Ophthalmology

## 2018-04-24 MED FILL — OMEPRAZOLE   20MG: 30 days supply | Qty: 30 | Fill #1 | Status: CP

## 2018-04-24 MED FILL — HYDROCHLOROT 25MG: 30 days supply | Qty: 30 | Fill #8 | Status: CP

## 2018-04-24 MED FILL — SIMVASTATIN 10MG: 30 days supply | Qty: 30 | Fill #5 | Status: CP

## 2018-04-24 MED FILL — VITAMIN D 50000UNT (ERGOCALCIFEROL): 28 days supply | Qty: 4 | Fill #1 | Status: CP

## 2018-04-24 MED FILL — VITAMIN D3 1000UNIT: 30 days supply | Qty: 30 | Fill #4 | Status: CP

## 2018-04-24 MED FILL — CITALOPRAM 10MG: 30 days supply | Qty: 30 | Fill #4 | Status: CP

## 2018-04-24 MED FILL — LOSARTAN POT 100MG: 30 days supply | Qty: 30 | Fill #8 | Status: CP

## 2018-04-24 MED FILL — FERROUS GLUC 324 MG: 30 days supply | Qty: 60 | Fill #7 | Status: CP

## 2018-04-24 MED FILL — RANITIDINE 150MG: 30 days supply | Qty: 60 | Fill #4 | Status: CP

## 2018-04-29 ENCOUNTER — Ambulatory Visit (HOSPITAL_BASED_OUTPATIENT_CLINIC_OR_DEPARTMENT_OTHER): Payer: Medicaid Other | Admitting: Rehabilitative and Restorative Service Providers"

## 2018-04-29 DIAGNOSIS — M25511 Pain in right shoulder: Secondary | ICD-10-CM | POA: Insufficient documentation

## 2018-04-29 DIAGNOSIS — M7541 Impingement syndrome of right shoulder: Secondary | ICD-10-CM

## 2018-04-29 NOTE — Progress Notes (Signed)
PT Evaluation    HPI: Pt is a 62 year old, R-handed female who presents to PT with R shoulder and upper traps pain which began 2 months ago, insidiously.     Aggravating factors: constant, working as a Electrical engineer, stopping moving/resting, worst at night.   Alleviating factors: tylenol (h/o gastric bypass)    ADLs: independent with increased pain.  Sleep: wakes regularly due to pain - has to get up and take tylenol, can be less painful to lay on R side vs Left side when R arm comes across body.   IADLs: increased pain especially with cleaning.  Driving: independent with increased pain occasionally.     Occupation: works as a Electrical engineer: 4 hours/day, 5 days/wk with increased pain especially with scrubbing.   Recreation: none.    X-ray R shoulder 03/29/18: Impression: No acute displaced fractures of the right shoulder.       04/29/18 1600   Language Information   Language of Care Portuguese   Interpreter Yes   Name [id number]   Evaluation Type   Evaluation Type Initial Evaluation   Rehab Discipline   Rehab Discipline PT   Visit   Visit number 1   POC Due date 06/01/18   Pain   Pain Score 7   (took 2 tylenols 4 hours ago)   Precautions   Precautions Yes   Other   Other (Comments) osteoporosis, h/o gastric bypass   Patient Stated Goals   Patient stated goals decrease R shoulder pain   Posture   Forward Head Minimal   Rounded Shoulders Minimal   Posture assessment X   LUE AROM (degrees)   L Shoulder Flexion  0-170 165 Degrees   L Shoulder Extension  0-60 65 Degrees   L Shoulder ABduction 0-40 170 Degrees   L Shoulder Internal Rotation  0-70   (to T8)   L Shoulder External Rotation  0-90 70 Degrees   LUE Strength   L Shoulder Flexion 4+/5   L Shoulder Extension 4/5   L Shoulder ABduction 4+/5   L Shoulder Internal Rotation 4+/5   L Shoulder External Rotation 4+/5   Scapular MMT   MMT Overall grossly 3+/5 (?pt's understanding via interpreter)   RUE Assessment   RUE Assessment X   RUE AROM (degrees)   R Shoulder  Flexion  0-170 145 Degrees  (pain)   R Shoulder Extension  0-60 40 Degrees  (pain)   R Shoulder ABduction 0-140 145 Degrees  (pain)   R Shoulder Internal Rotation  0-70   (to T10 with pain)   R Shoulder External Rotation  0-90 65 Degrees  (pain)   RUE PROM (degrees)   RUE Overall PROM full R shoulder PROM with pain at end ranges.    RUE Strength   R Shoulder Flexion 4/5  (pain)   R Shoulder Extension 4/5  (pain)   R Shoulder ABduction 4/5  (pain)   R Shoulder Internal Rotation 4/5  (pain)   R Shoulder External Rotation 4/5  (pain)   Scapular MMT   MMT Overall grossly 3+/5 (?pt's understanding via interpreter)   Clinical Special Tests   Special Tests Yes   Right Shoulder results   R Hawkins-Kennedy Positive   R Full can  Positive   R Empty Can Positive   R Neer Positive   Palpation   Tenderness to Palpation TTP R supraspinatus, biceps, upper traps   Increased Tissue Density min increased tension R upper traps, supraspinatus and biceps  UE Joint Mobility (0-6 scale)   UE Joint Mobility assessment Yes   Right Shoulder Glenohumeral   R Glenohumeral Anterior 3/6   R Glenohumeral Posterior 3/6   R Glenohumeral inferior 3/6   R Glenohumeral distraction 3/6   Right Shoulder Acromioclavicular   R Acromioclavicular Ventrolateral 3/6   Left Shoulder Glenohumeral   L Glenohumeral Anterior 3/6   L Glenohumeral Posterior 3/6   L Glenohumeral inferior 3/6   L Glenohumeral distraction 3/6   Left Shoulder Acromioclavicular   L Acromioclavicular Ventrolateral 3/6   Patient Education   What was taught? PT POC; ice or heat for pain control prn   Method Verbal   Patient comprehension Yes     Physical Therapy Plan of Care     ZO:XWRUE,AVWUJ APRN  Referring Provider: Mariel Aloe, PA-C  Diagnosis: Right shoulder pain, unspecified chronicity  (primary encounter diagnosis)  Impingement syndrome of right shoulder    Assessment/Objective Findings:   Patient is a 63 year old R-handed female who presents to PT with R shoulder and upper traps  pain which began insidiously 2 months ago. PT exam findings suggest repetitive stress strain to the R RTC, biceps and upper traps with 2ndary subacromial impingement. Pt with limited R shoulder AROM with pain. Full R shoulder PROM and GH joint mobility. Significant tenderness and tension in R supraspinatus, upper traps and biceps. Limited R shoulder strength with pain. Limited B scapula stabilizer strength, however question pt's understanding of scapula MMT despite use of telephone interpreter. Positive R full can, empty can, Neer's and Hawkin's-Kennedy special tests. 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 Pt will benefit from skilled PT to address impairments and educate pt on appropriate HEP and self-management skills to optimize pt's ability to perform daily and occupational activities without pain or limitation. The prescribed treatment plan of care is medically necessary.    Co-morbidities of obesity, h/o bariatric surgery, benign hypertensive heart disease without heart failure, gastritis, osteoporosis, heart murmur, L subclavian artery stenosis were identified and taken into considerations of plan of care.    Short Term Functional Goals: 4 weeks.   1. Pt will be compliant with HEP.   2. Pt will demonstrate independent R shoulder pendulums 3x30 sec reps.   3. Pt will have full R shoulder AROM.   4. Pt will be able to initiate B scapula stabilizer strengthening without increased pain.     Long Term Goal: 6 weeks.   1. Pt will be independent with HEP.   2. Pt will have increased B scapula stabilizer strength by 1/3 MMT grade.   3. Pt will have negative R shoulder full and empty can special tests.   4. Pt will have decreased tension in R supraspinatus, upper traps and biceps: R=L  5. Pt will demonstrate independent body mechanics with typical cleaning activities.     Treatment Plan:  ** PT Eval - Low Complexity (CPT 97161)  **  Stretching/ROM/Therapeutic Exercise [phone])  ** Home Exercise Program/ Patient Education (CPT [phone])  ** Neuromuscular Re-education (CPT [phone])  ** Manual Therapy / Joint / Soft tissue Mobilization (CPT 97140)  ** Hot/Cold Rx (CPT 97010)  ** Functional Activities (CPT 97530)    Recommend Physical Therapy be continued 2 times per week for 6 weeks.  The rehabilitation potential for this patient is good    Patient Regis Bill is aware of attendance policy: Yes  Plan of care discussed with  Patient/Family: Yes  Patient goals reviewed and incorporated in plan of care: Yes  Patient/Family agrees with plan of care: Yes  Patient/Family education: Yes  Does patient feel safe at home: Yes    Egbert Garibaldi, PT, DPT Lic # [id number]

## 2018-04-29 NOTE — Progress Notes (Signed)
PT Treatment Flowsheet     04/29/18 1600   Language Information   Language of Care Portuguese   Interpreter Yes   Name [id number]   Precautions   Precautions Yes   Other   Other (Comments) osteoporosis, h/o gastric bypass   Rehab Discipline   Rehab Discipline PT   Visit   Visit number 1   POC Due date 06/01/18   Time Calculation   Start Time 1600   Stop Time 1640   Time Calculation (min) 40 min   Pain   Pain Score 7   (took 2 tylenols 4 hours ago)   Patient Education   What was taught? PT POC; ice or heat for pain control prn   Method Verbal   Patient comprehension Yes     Egbert Garibaldi, PT, DPT Lic # [id number]

## 2018-05-07 ENCOUNTER — Ambulatory Visit (HOSPITAL_BASED_OUTPATIENT_CLINIC_OR_DEPARTMENT_OTHER): Payer: Medicaid Other | Admitting: Rehabilitative and Restorative Service Providers"

## 2018-05-08 NOTE — Progress Notes (Signed)
I certify that the documented Treatment Plan is reasonable and necessary.    05/08/2018  April Manson, PA-C

## 2018-05-09 ENCOUNTER — Ambulatory Visit (HOSPITAL_BASED_OUTPATIENT_CLINIC_OR_DEPARTMENT_OTHER): Payer: Medicaid Other | Admitting: Internal Medicine

## 2018-05-20 ENCOUNTER — Ambulatory Visit (HOSPITAL_BASED_OUTPATIENT_CLINIC_OR_DEPARTMENT_OTHER): Payer: Medicaid Other | Admitting: Physical Therapy

## 2018-05-22 ENCOUNTER — Ambulatory Visit (HOSPITAL_BASED_OUTPATIENT_CLINIC_OR_DEPARTMENT_OTHER): Payer: Medicaid Other | Admitting: Physical Therapy

## 2018-05-23 ENCOUNTER — Telehealth (HOSPITAL_BASED_OUTPATIENT_CLINIC_OR_DEPARTMENT_OTHER): Payer: Self-pay

## 2018-05-23 NOTE — Progress Notes (Signed)
Reason for call: Depression [facility]), called [name], 62 year old, today for depression outreach.     NOTE: MHCP introduced herself and her role to the patient. Patient reports that this is not a good time to speak because she is currently at work. Patient asked for a call back at a later date.   Telephone interpreter from "[employer]" assisted with call, with first attempt. [place] interpreter assisted with call at second attempt.       FOLLOW UP: MHCP to follow up with patient again within the next 2 weeks.       Ambrose Mantle, 05/23/2018

## 2018-05-23 NOTE — Telephone Encounter (Signed)
-----   Message from [facility] sent at 05/23/2018  1:06 PM EDT -----  Regarding: returning call  Contact: [place] [phone], 62 year old, female    Calls today:  returning call  Person calling on behalf of patient: Patient (self)    [place] NUMBER: [phone]  Best time to call back:   Cell phone:   Other phone:    Patient's language of care: [place]    Patient needs a [place] interpreter.    Patient's PCP: Lawernce Pitts APRN

## 2018-05-23 NOTE — Progress Notes (Signed)
[facility]), attempted to contact [name], 62 year old, today via phone call. MHCP was unable to reach patient and left voicemail requesting a call back. Contact information for [facility] left on voicemail.      Reason for Call: Depression Outreach   Attempt Number:  [place], 05/23/2018

## 2018-05-27 ENCOUNTER — Ambulatory Visit (HOSPITAL_BASED_OUTPATIENT_CLINIC_OR_DEPARTMENT_OTHER): Payer: Medicaid Other | Admitting: Rehabilitative and Restorative Service Providers"

## 2018-05-30 ENCOUNTER — Ambulatory Visit (HOSPITAL_BASED_OUTPATIENT_CLINIC_OR_DEPARTMENT_OTHER): Payer: Medicaid Other | Admitting: Rehabilitative and Restorative Service Providers"

## 2018-06-03 ENCOUNTER — Ambulatory Visit (HOSPITAL_BASED_OUTPATIENT_CLINIC_OR_DEPARTMENT_OTHER): Payer: Medicaid Other | Admitting: Rehabilitative and Restorative Service Providers"

## 2018-06-04 ENCOUNTER — Telehealth (HOSPITAL_BASED_OUTPATIENT_CLINIC_OR_DEPARTMENT_OTHER): Payer: Self-pay

## 2018-06-04 NOTE — Progress Notes (Signed)
Reason for call: Depression [place] [facility]), called [name], 62 year old, today for depression outreach.       NOTE: MHCP introduced herself and role to the patient. Patient reports that this is a good time to speak. When asked how she is doing, patient reports that she is "feeling good thank god, feels better, still taking citalopram and feels good." Patient reports feeling fine, and reports that she is okay with outreach calls being spread out a little further. Patient declined further supports from [facility] at this time. Patient also reports that she has the contact information for the clinic and will reach out to the clinic if she has any questions.     INTERVENTION: Psycho Education     FOLLOW UP: MHCP to outreach to patient within 2 months. Patient to outreach to the clinic should she have any questions.       Ambrose Mantle, 06/04/2018

## 2018-06-05 ENCOUNTER — Ambulatory Visit (HOSPITAL_BASED_OUTPATIENT_CLINIC_OR_DEPARTMENT_OTHER): Payer: Medicaid Other | Admitting: Physical Therapy

## 2018-06-07 ENCOUNTER — Encounter (HOSPITAL_BASED_OUTPATIENT_CLINIC_OR_DEPARTMENT_OTHER): Payer: Self-pay | Admitting: Rehabilitative and Restorative Service Providers"

## 2018-06-07 DIAGNOSIS — M25511 Pain in right shoulder: Secondary | ICD-10-CM

## 2018-06-07 DIAGNOSIS — M7541 Impingement syndrome of right shoulder: Secondary | ICD-10-CM

## 2018-06-07 NOTE — Progress Notes (Signed)
PHYSICAL THERAPY  DISCHARGE REPORT    Referring Provider: Mariel Aloe, PA-C  Diagnosis: Right shoulder pain, unspecified chronicity  (primary encounter diagnosis)  Impingement syndrome of right shoulder    Your patient, Mariah [name], has been discharged from Physical Therapy after a total of 1 visit - Initial Eval only.    REASON(S) FOR DISCHARGE:    This patient has: cancelled all scheduled follow-up appointments. Initial Eval completed 04/29/18, plan of care has since expired.     RECOMMENDATIONS:    This patient should: follow-up with referring provider should symptoms worsen or fail to improve.     COMMENTS:    If you have any questions please feel free to contact the department at Dept: [phone].    Therapist: Egbert Garibaldi, PT, DPT    Egbert Garibaldi, PT, Lic # [id number]

## 2018-06-10 ENCOUNTER — Ambulatory Visit (HOSPITAL_BASED_OUTPATIENT_CLINIC_OR_DEPARTMENT_OTHER): Payer: Medicaid Other | Admitting: Rehabilitative and Restorative Service Providers"

## 2018-06-10 ENCOUNTER — Other Ambulatory Visit (HOSPITAL_BASED_OUTPATIENT_CLINIC_OR_DEPARTMENT_OTHER): Payer: Self-pay | Admitting: Internal Medicine

## 2018-06-10 DIAGNOSIS — I119 Hypertensive heart disease without heart failure: Secondary | ICD-10-CM

## 2018-06-10 MED FILL — SIMVASTATIN 10MG: 30 days supply | Qty: 30 | Fill #0 | Status: CP

## 2018-06-10 MED FILL — VITAMIN D3 1000UNIT: 30 days supply | Qty: 30 | Fill #5 | Status: CP

## 2018-06-10 MED FILL — CITALOPRAM 10MG: 30 days supply | Qty: 30 | Fill #5 | Status: CP

## 2018-06-10 MED FILL — LOSARTAN POT 100MG: 30 days supply | Qty: 30 | Fill #0 | Status: CP

## 2018-06-10 MED FILL — HYDROCHLOROT 25MG: 30 days supply | Qty: 30 | Fill #0 | Status: CP

## 2018-06-10 MED FILL — FERROUS GLUC 324 MG: 30 days supply | Qty: 60 | Fill #8 | Status: CP

## 2018-06-10 MED FILL — OMEPRAZOLE   20MG: 30 days supply | Qty: 30 | Fill #2 | Status: CP

## 2018-06-10 MED FILL — VITAMIN D 50000UNT (ERGOCALCIFEROL): 28 days supply | Qty: 4 | Fill #2 | Status: CP

## 2018-06-10 MED FILL — RANITIDINE 150MG: 30 days supply | Qty: 60 | Fill #5 | Status: CP

## 2018-06-10 NOTE — Progress Notes (Signed)
PER Pharmacy, Mariah [name] is a 62 year old female has requested a refill of losartan,hctz,simvastatin.      Last Office Visit: 04/09/18 with wing,d  Last Physical Exam: n/a    There are no preventive care reminders to display for this patient.    Other Med Adult:  Most Recent BP Reading(s)  04/09/18 : 147/77        Cholesterol (mg/dL)   Date Value   08/24/2016 190     LOW DENSITY LIPOPROTEIN DIRECT (mg/dL)   Date Value   08/24/2016 100     HIGH DENSITY LIPOPROTEIN (mg/dL)   Date Value   08/24/2016 71     TRIGLYCERIDES (mg/dL)   Date Value   08/24/2016 95         THYROID SCREEN TSH REFLEX FT4 (uIU/mL)   Date Value   06/09/2014 2.040         TSH (THYROID STIM HORMONE) (uIU/mL)   Date Value   04/03/2012 1.68       No results found for: HGBA1C    No results found for: POCA1C      INR (no units)   Date Value   04/28/2014 < 1.0   01/18/2007 1.0 (L)       SODIUM (mmol/L)   Date Value   11/15/2017 142       POTASSIUM (mmol/L)   Date Value   11/15/2017 4.3           CREATININE (mg/dL)   Date Value   11/15/2017 0.8       Documented patient preferred pharmacies:    [facility], [place], [place]  Phone: [phone] Fax: [phone]

## 2018-07-09 ENCOUNTER — Emergency Department (HOSPITAL_BASED_OUTPATIENT_CLINIC_OR_DEPARTMENT_OTHER): Payer: No Typology Code available for payment source

## 2018-07-09 ENCOUNTER — Ambulatory Visit: Payer: No Typology Code available for payment source | Attending: Internal Medicine | Admitting: Orthopaedic Surgery

## 2018-07-09 ENCOUNTER — Emergency Department
Admission: EM | Admit: 2018-07-09 | Discharge: 2018-07-09 | Disposition: A | Discharge status: Home / Self Care | Payer: No Typology Code available for payment source | Source: Intra-hospital | Attending: Emergency Medicine | Admitting: Emergency Medicine

## 2018-07-09 DIAGNOSIS — S59902A Unspecified injury of left elbow, initial encounter: Secondary | ICD-10-CM | POA: Diagnosis not present

## 2018-07-09 DIAGNOSIS — M25461 Effusion, right knee: Secondary | ICD-10-CM | POA: Diagnosis not present

## 2018-07-09 DIAGNOSIS — W19XXXA Unspecified fall, initial encounter: Secondary | ICD-10-CM | POA: Diagnosis not present

## 2018-07-09 DIAGNOSIS — W01198A Fall on same level from slipping, tripping and stumbling with subsequent striking against other object, initial encounter: Secondary | ICD-10-CM | POA: Diagnosis present

## 2018-07-09 DIAGNOSIS — S8991XA Unspecified injury of right lower leg, initial encounter: Secondary | ICD-10-CM | POA: Diagnosis present

## 2018-07-09 DIAGNOSIS — M25422 Effusion, left elbow: Secondary | ICD-10-CM

## 2018-07-09 DIAGNOSIS — M7541 Impingement syndrome of right shoulder: Secondary | ICD-10-CM

## 2018-07-09 DIAGNOSIS — Z87891 Personal history of nicotine dependence: Secondary | ICD-10-CM | POA: Diagnosis not present

## 2018-07-09 DIAGNOSIS — M25561 Pain in right knee: Secondary | ICD-10-CM | POA: Diagnosis not present

## 2018-07-09 DIAGNOSIS — S42402A Unspecified fracture of lower end of left humerus, initial encounter for closed fracture: Secondary | ICD-10-CM | POA: Diagnosis not present

## 2018-07-09 MED ORDER — ACETAMINOPHEN 325 MG PO TABS: 325 mg | tablet | Freq: Four times a day (QID) | ORAL | 0 refills | 0 days | Status: AC | PRN

## 2018-07-09 MED ORDER — IBUPROFEN 600 MG PO TABS: 600 mg | tablet | Freq: Four times a day (QID) | ORAL | 0 refills | 0 days | Status: AC | PRN

## 2018-07-09 MED ORDER — IBUPROFEN 600 MG PO TABS
600.00 mg | ORAL_TABLET | Freq: Four times a day (QID) | ORAL | 0 refills | Status: AC | PRN
Start: 2018-07-09 — End: 2018-07-16

## 2018-07-09 MED ORDER — ACETAMINOPHEN 325 MG PO TABS
325.0000 mg | ORAL_TABLET | Freq: Four times a day (QID) | ORAL | 0 refills | Status: AC | PRN
Start: 2018-07-09 — End: 2018-08-08

## 2018-07-09 NOTE — ED Provider Notes (Signed)
I have reviewed the ED nursing notes and prior records. I have reviewed the patient's past medical history/problem list, allergies, social history and medication list.  I saw this patient primarily.    HPI:  This 62 year old female patient presents with chief complaint of left elbow pain and right knee pain.  Last night pt tripped over her dog and fell. She hit her face and arm and knee.  She did not lose consciousness.  No neck pain.  No numbness, tingling, weakness.  No blurry vision.  No n/v.  Unable to fully extend her left elbow.  Hurts to bend her right knee.  Able to ambulate but with pain.      ROS: Pertinent positives were reviewed as per the HPI above. All other systems were reviewed and are negative.    Past Medical History/Problem List:  Past Medical History:  03/27/2006: ABN FIND-STOOL CONTENTS-OCC BLOOD      Comment:  s/p colonoscopy and EGD 7/07  06/04/2006: ABSENCE OF MENSTRUATION - s/p TAH BSO  08/05/2004: ANXIETY DEPRESSION      Comment:  TSH wnl 8/02; trial of fluoxetine 8/05  08/05/2004: BACK PAIN LOW      Comment:  LBP musculoskeletal most likely 1/04  04/23/2006: Cough      Comment:  s/p PFTs September 2007 --  some obstruction at mid to                low lung volumes not correctable by bronchodilator CT                scan showed stable pulmonary nodules over several years                11/07 - cough gone ** note combined with dx of "nodules                on CT scan". Will file this note to hx.     08/05/2004: depressive disorder      Comment:  TSH wnl 8/02; trial of fluoxetine x 6 wks didn't really                help 8/05  trial of citalopram per Dr. Patrice Paradise. later                resolved. 04/15/2014 - Reassessed, pt reports feeling                well, with no sx of depression. Just reports being tired,               which she attributes to current life style. Will resolve                from problem list.    03/27/2006: Diarrhea      Comment:  7/07 The colonoscopy was visually normal and  biopsies                taken to rule out microscopic colitis were negative.  A                biopsy of the descending duodenum to rule out celiac                sprue because of the diarrhea was negative.    12/19/2012: Gastritis      Comment:  Combined with entry "esophageal reflux    10/22/2006: Hyperparathyroidism      Comment:  osteoporosis found - pt referred to surgery for the  hyperparathyroidism- s/p parathyroidectomy feb 2008  06/18/2012: Inconclusive mammogram      Comment:  June 2013 - BIRADS 3. Probable benign mammogram.                Recommend 6 month  follow-up mammogram (Dec 2013).                 Dec/13 - Stable left breast nodule with no evidence of                malignancy. BIRADS 2 benign recommend annual screening                mammogram in 6 months  (June 2014) June/14 - mammogram                request given to pt, she will call to schedule:  Jan/15 -               normal mammogram, yearly screening. Resolve from problem                list.        03/13/2006: Insomnia, unspecified      Comment:  06/25/2012 - intermittent, usually resolved by taking                Tylenol PM 06/09/2014 - good response to trazodone, takes                one to 4x wk, prn 08/24/2016 - reports no problems sleeping               - file to hx, and resolve from problem list.   03/27/2006: nodules on chest CT 1/06 and 3/07      Comment:  s/p PFTs September 2007 --  some obstruction at mid to                low lung volumes not correctable by bronchodilator CT                scan showed stable pulmonary nodules over several years                11/07 - cough gone  04/29/12 - saw pneumologist, Dr Jordan Hawks,               "Will repeat chest CT with high-resolution scans at the 2               month mark (already ordered). If abnormal, would do PFT's               and further rheum w/u, consider lung bx . Pt to call if                any r  08/05/2004: OBESITY NOS      Comment:  has tried meds from [place]  08/05/2004:  PERS HX OF PAST NONCOMPLIANCE      Comment:  poor compliance to med regimen  08/05/2004: PERS HX TOBACCO USE      Comment:  quit 10/02; 1/2 PPD since 62 YO  08/05/2004: Screening for hypertension      Comment:  occ elevated BP started on anti-HTN in [place]; chem 7,                EKG, HCT u/a wnl 4/03; d/c'ed HCTZ 8/05 when we learned                from daughter she has not been taking it  05/19/2008:  Vitamin D deficiency      Comment:  Supplement per dr Jake Samples.  Patient Active Problem List:     Obesity, unspecified     LATERAL FOOT PAIN     Benign hypertensive heart disease without heart failure     Alkaline phosphatase elevation     Calculus of kidney     Gastritis     Allergic rhinitis due to other allergen     Osteoporosis     Murmur heart     Family history of throat cancer     Vitamin D deficiency     Subclavian artery stenosis, left (HCC)     Schatzki's ring     Duodenal ulcer, unspecified as acute or chronic, without hemorrhage, perforation, or obstruction     Mild depression ([place])     Glaucoma suspect of both eyes     Hyperopia with presbyopia of both eyes      Past Surgical History:  Past Surgical History:  04/2016: BARIATRIC SURGERY      Comment:  done at [place]  2006: PARATHYROIDECTOMY      Comment:  partial,   5/00: SALPINGO-OOPHORECTOMY COMPL/PRTL UNI/BI SPX      Comment:  Salpingo-Oophorectomy, bilateral  No date: TONSILLECTOMY ONE-HALF <AGE 26  5/00: TOTAL ABDOMINAL HYSTERECT W/WO RMVL TUBE OVARY      Comment:  Hysterectomy, Total Abdominal secondary to fibroids    Medications:      Prior to Admission Medications   Prescriptions Last Dose Informant Patient Reported? Taking?   Biotin 1000 MCG TABS Tablet   No No   Sig: Take 1,000 mcg by mouth daily   Vitamin D, Ergocalciferol, 50000 units capsule   No No   Sig: Take 1 capsule by mouth once a week for 12 doses   cholecalciferol (VITAMIN D3) 1000 UNIT tablet   No No   Sig: Take 1 tablet by mouth daily   citalopram (CELEXA) 10 MG tablet   No  No   Sig: Take 1 tablet by mouth daily   hydrochlorothiazide (HYDRODIURIL) 25 MG tablet   No No   Sig: Take 1 tablet by mouth daily   losartan (COZAAR) 100 MG tablet   No No   Sig: Take 1 tablet by mouth daily   omeprazole (PRILOSEC) 20 MG capsule   No No   Sig: Take 1 capsule by mouth daily   simvastatin (ZOCOR) 10 MG tablet   No No   Sig: Take 1 tablet by mouth nightly      Facility-Administered Medications: None       Social History:  Social History    Tobacco Use      Smoking status: Former Smoker        Packs/day: 0.50        Years: 40.00        Pack years: 20        Types: Cigarettes        Quit date: 03/31/2012        Years since quitting: 6.2      Smokeless tobacco: Former Systems developer      Tobacco comment: 3-4 cigarretes a day. Quit about 1 month ago    Alcohol use: Yes      Comment: very occasional      Allergies:  Review of Patient's Allergies indicates:   Heparin                     Comment:local rash seen during hospitalization  Feb 2008   Lisinopril              Cough    Physical Exam:  BP 134/82  Pulse 87  Temp 98.4 F  Resp 18  SpO2 96%  GENERAL:  Well-appearing, no distress.  SKIN:  Warm & Dry, no rash  HEAD:  Atraumatic. Anicteric sclera. EOMI.  No facial bony tenderness.    NECK:  Supple with full painless ROM at the neck, no midline cervical tenderness.    LUNGS:  Clear to auscultation bilaterally without rales, rhonchi or wheezing.   HEART:  RRR.  No murmurs, rubs, or gallops.   ABDOMEN:  Nontender to palpation.   MUSCULOSKELETAL:  No deformities. Right knee edema, tender over the patella.  No tibial plateau tenderness.  2+ pedal pulses.  Left elbow with diffuse tenderness.  Full ROM of wrist and shoulder.  2+ radial pulses.    NEUROLOGIC:  Normal speech.  Moving all four extremities.  PSYCHIATRIC:  Normal affect    DIAGNOSTICS:   No results found for this visit on 07/09/18 (from the past 24 hour(s)).    ED Course and Medical Decision-making:    The patient is 62 year old female with left elbow and right  knee pain after fall.  No indication for CT head or neck at this time.     Plan for xrays of left elbow and right knee.     Left elbow suspicious for fx with anterior fat pad sail sign and posterior fat pad.  Plan for sling.     Right knee with no fx or dislocation, there is significant swelling, plan for ACE wrap and crutches for comfort.      Message sent to ortho within the Epic system.      Plan for tylenol/ibuprofen.     VSS BP 134/82  Pulse 87  Temp 98.4 F  Resp 18  SpO2 96%    Reasons to return to the ED were reviewed in detail. The patient agrees with this plan and disposition.    Disposition: Discharge    Diagnosis/Diagnoses:  Closed fracture of left elbow, initial encounter  Acute pain of right knee  Effusion, right knee    Critical care time outside of procedures: 0    Atilano Median, MD  Attending [place]

## 2018-07-09 NOTE — ED Triage Note (Signed)
Patient to ED from home, reports last night she tripped over her dog and fell forward, landing on her face and injuring left elbow and right knee.  Denies LOC, last took tylenol at 2:30a

## 2018-07-09 NOTE — Progress Notes (Signed)
CC: Right shoulder pain, recent fall, R knee + left elbow    HPI:  F/u right shoulder pain - a little better, wants to schedule PT  Also fell yesterday and went to ED for left elbow and R knee - wants to be seen for those new injuries as well.    Re: HPI, see also scanned New Patient form for additional info provided by patient     ROS: Review of systems filled out by patient and scanned into the medical record. Reviewed by me. All other systems are reviewed and are negative except as noted in HPI.    PMH: Past Medical History:  03/27/2006: ABN FIND-STOOL CONTENTS-OCC BLOOD      Comment:  s/p colonoscopy and EGD 7/07  06/04/2006: ABSENCE OF MENSTRUATION - s/p TAH BSO  08/05/2004: ANXIETY DEPRESSION      Comment:  TSH wnl 8/02; trial of fluoxetine 8/05  08/05/2004: BACK PAIN LOW      Comment:  LBP musculoskeletal most likely 1/04  04/23/2006: Cough      Comment:  s/p PFTs September 2007 --  some obstruction at mid to                low lung volumes not correctable by bronchodilator CT                scan showed stable pulmonary nodules over several years                11/07 - cough gone ** note combined with dx of "nodules                on CT scan". Will file this note to hx.     08/05/2004: depressive disorder      Comment:  TSH wnl 8/02; trial of fluoxetine x 6 wks didn't really                help 8/05  trial of citalopram per Dr. Patrice Paradise. later                resolved. 04/15/2014 - Reassessed, pt reports feeling                well, with no sx of depression. Just reports being tired,               which she attributes to current life style. Will resolve                from problem list.    03/27/2006: Diarrhea      Comment:  7/07 The colonoscopy was visually normal and biopsies                taken to rule out microscopic colitis were negative.  A                biopsy of the descending duodenum to rule out celiac                sprue because of the diarrhea was negative.    12/19/2012: Gastritis      Comment:  Combined  with entry "esophageal reflux    10/22/2006: Hyperparathyroidism      Comment:  osteoporosis found - pt referred to surgery for the                hyperparathyroidism- s/p parathyroidectomy feb 2008  06/18/2012: Inconclusive mammogram      Comment:  June 2013 - BIRADS 3. Probable benign mammogram.  Recommend 6 month  follow-up mammogram (Dec 2013).                 Dec/13 - Stable left breast nodule with no evidence of                malignancy. BIRADS 2 benign recommend annual screening                mammogram in 6 months  (June 2014) June/14 - mammogram                request given to pt, she will call to schedule:  Jan/15 -               normal mammogram, yearly screening. Resolve from problem                list.        03/13/2006: Insomnia, unspecified      Comment:  06/25/2012 - intermittent, usually resolved by taking                Tylenol PM 06/09/2014 - good response to trazodone, takes                one to 4x wk, prn 08/24/2016 - reports no problems sleeping               - file to hx, and resolve from problem list.   03/27/2006: nodules on chest CT 1/06 and 3/07      Comment:  s/p PFTs September 2007 --  some obstruction at mid to                low lung volumes not correctable by bronchodilator CT                scan showed stable pulmonary nodules over several years                11/07 - cough gone  04/29/12 - saw pneumologist, Dr Jordan Hawks,               "Will repeat chest CT with high-resolution scans at the 2               month mark (already ordered). If abnormal, would do PFT's               and further rheum w/u, consider lung bx . Pt to call if                any r  08/05/2004: OBESITY NOS      Comment:  has tried meds from [place]  08/05/2004: PERS HX OF PAST NONCOMPLIANCE      Comment:  poor compliance to med regimen  08/05/2004: PERS HX TOBACCO USE      Comment:  quit 10/02; 1/2 PPD since 62 YO  08/05/2004: Screening for hypertension      Comment:  occ elevated BP started on anti-HTN in  [place]; chem 7,                EKG, HCT u/a wnl 4/03; d/c'ed HCTZ 8/05 when we learned                from daughter she has not been taking it  05/19/2008: Vitamin D deficiency      Comment:  Supplement per dr Jake Samples.    Surgical HX: Past Surgical History:  04/2016: BARIATRIC SURGERY      Comment:  [facility]  2006: PARATHYROIDECTOMY      Comment:  partial,   5/00: SALPINGO-OOPHORECTOMY COMPL/PRTL UNI/BI SPX      Comment:  Salpingo-Oophorectomy, bilateral  No date: TONSILLECTOMY ONE-HALF <AGE 47  5/00: TOTAL ABDOMINAL HYSTERECT W/WO RMVL TUBE OVARY      Comment:  Hysterectomy, Total Abdominal secondary to fibroids    SH:   Social History     Socioeconomic History    Marital status: Married     Spouse name: Not on file    Number of children: Not on file    Years of education: Not on file    Highest education level: Not on file   Occupational History    Not on file   Social Needs    Financial resource strain: Not on file    Food insecurity:     Worry: Not on file     Inability: Not on file    Transportation needs:     Medical: Not on file     Non-medical: Not on file   Tobacco Use    Smoking status: Former Smoker     Packs/day: 0.50     Years: 40.00     Pack years: 20.00     Types: Cigarettes     Last attempt to quit: 03/31/2012     Years since quitting: 6.2    Smokeless tobacco: Former Systems developer    Tobacco comment: 3-4 cigarretes a day. Quit about 1 month ago   Substance and Sexual Activity    Alcohol use: Yes     Comment: very occasional    Drug use: No    Sexual activity: Yes     Partners: Male     Comment: histerectomy   Lifestyle    Physical activity:     Days per week: Not on file     Minutes per session: Not on file    Stress: Not on file   Relationships    Social connections:     Talks on phone: Not on file     Gets together: Not on file     Attends religious service: Not on file     Active member of club or organization: Not on file     Attends meetings of clubs or organizations: Not  on file     Relationship status: Not on file    Intimate partner violence:     Fear of current or ex partner: Not on file     Emotionally abused: Not on file     Physically abused: Not on file     Forced sexual activity: Not on file   Other Topics Concern    Not on file   Social History Narrative    work: Engineer, building services; owned bakery in [place]    Lives w/ daughter 40 YO (Grazielle Sedgwick) & son-in-law Lowella Dandy)    Divorced; currently not in a relationship        05/27/2015    From [place]. Works as Electrical engineer, has own business.     Married, Djaniro.     No regular exercise    Diet: varies. Eats at irregular intervals. Tries to watch diet, but likes to eat    Stressors: work    Pleasures: family, grand children        08/24/2016    Same living and work situation. No regular exercise. Diet improved after bariatric surgery, and pt is very happy with results.        Allergies: Review of Patient's Allergies  indicates:   Heparin                     Comment:local rash seen during hospitalization Feb 2008   Lisinopril              Cough    Current Medications:   Current Outpatient Medications:     acetaminophen (TYLENOL) 325 MG tablet, Take 1-2 tablets by mouth every 6 (six) hours as needed for Pain, Disp: 15 tablet, Rfl: 0    ibuprofen (ADVIL,MOTRIN) 600 MG tablet, Take 1 tablet by mouth every 6 (six) hours as needed for Pain pain  for up to 7 days, Disp: 20 tablet, Rfl: 0    losartan (COZAAR) 100 MG tablet, Take 1 tablet by mouth daily, Disp: 30 tablet, Rfl: 11    hydrochlorothiazide (HYDRODIURIL) 25 MG tablet, Take 1 tablet by mouth daily, Disp: 30 tablet, Rfl: 11    simvastatin (ZOCOR) 10 MG tablet, Take 1 tablet by mouth nightly, Disp: 30 tablet, Rfl: 11    omeprazole (PRILOSEC) 20 MG capsule, Take 1 capsule by mouth daily, Disp: 30 capsule, Rfl: 11    citalopram (CELEXA) 10 MG tablet, Take 1 tablet by mouth daily, Disp: 30 tablet, Rfl: 5    cholecalciferol (VITAMIN D3) 1000 UNIT tablet, Take 1 tablet by mouth  daily, Disp: 30 tablet, Rfl: 11    Biotin 1000 MCG TABS Tablet, Take 1,000 mcg by mouth daily, Disp: 30 tablet, Rfl: 11    Vitamin D, Ergocalciferol, 50000 units capsule, Take 1 capsule by mouth once a week for 12 doses, Disp: 4 capsule, Rfl: 2    Re: past history, social history, meds and allergies, see also scanned New Patient form for additional info provided by patient    Physical Exam   There were no vitals filed for this visit.  General Appearance: Alert and oriented x3, pleasant and cooperative.  Mood: Appropriate.  Gait: Nonantalgic.   Breathing/Respiratory: Non-labored.  Musculoskeletal:  On examination of the right shoulder, skin is intact. Full FE w mild pain. +Impingement maneuvers.  L elbow - lacks 20 degrees extension. Diffusely tender. Moderate swelling.  R knee - full ROM. Moderate swelling.    Imaging: Available images visualized and reports reviewed.    A/P: This is an 62 year old female    (M75.41) Shoulder impingement syndrome, right  (primary encounter diagnosis)  Comment: Slowly improving  Plan: REFERRAL TO PHYSICAL THERAPY ( INT) - F/u after PT    (D22.02RK) Injury of right knee, initial encounter  Comment: Acute  Plan: RICE f/u PRN    (Y70.623J) Elbow injury, left, initial encounter  Comment: Acute - no obvious fx on X ray  Plan: RICE - f/u PRN    Lauro Franklin, MD, 07/09/2018

## 2018-07-09 NOTE — Narrator Note (Signed)
Patient Disposition    Patient education for diagnosis, medications, activity, diet and follow-up.  Patient left ED 7:10 AM.  Patient rep received written instructions.  Interpreter to provide instructions: Yes    Patient belongings with patient: YES    Have all existing LDAs been addressed? N/A    Have all IV infusions been stopped? N/A    Discharged to: Discharged to home  Discharge instructions explained to patient including follow up care and prescriptions patient is agreeable to DC plan, provided teach back.  Patient reports having ortho appt today already and will follow up

## 2018-07-09 NOTE — Discharge Instructions (Signed)
You came in today because you fell and have pain in your right knee and left elbow.     Based on your history and physical exam- we do not believe there is anything emergently dangerous causing your symptoms.      The xray of your elbow is suspicious for a fracture.      Your knee is swollen but there are no fractures.      The radiologist will re-read the xray today and you will receive a phone call with any discrepancies.      In the meantime please wear a sling for your elbow.     Use the ACE wrap and crutches for your knee.     You should receive a phone call from orthopedics to schedule follow up.     Please call the number attached if you do not hear from them in 24-48 hours.       Alternate doses of Tylenol (acetaminophen) with Motrin/Advil (ibuprofen) to control fever/pain.  Wait 6 hours between the SAME kind of medication.  An example schedule follows:       6:00am   Motrin/Advil (ibuprofen)  600 MG = 3 TABLETS, TAKE WITH FOOD!     9:00am   Tylenol (acetaminophen)  975 MG = 3 REGULAR strength tablets     12:00noon   Motrin/Advil     3:00pm   Tylenol     6:00pm   Motrin/Advil     9:00pm   Tylenol     etcetera          Diagnosis or diagnoses:      What we did in the Emergency Department (ED):    Next steps:  - Follow up with your primary care doctor    - Take antibiotics for the full course, as prescribed, for complete treatment of your infection    - Please return if you experience new or worsening symptoms    - Alternate doses of Tylenol (acetaminophen) with Motrin/Advil (ibuprofen) to control fever/pain.  Wait 6 hours between the SAME kind of medication.  An example schedule follows:       6:00am   Motrin/Advil (ibuprofen)  600 MG = 3 TABLETS, TAKE WITH FOOD!     9:00am   Tylenol (acetaminophen)  975 MG = 3 REGULAR strength tablets     12:00noon   Motrin/Advil     3:00pm   Tylenol     6:00pm   Motrin/Advil     9:00pm   Tylenol     etcetera          Come back to the Emergency Department (ED) for:  Vomiting  blood, fever not responding to Tylenol after one hour, unusual behavior, not peeing for over 6 hours.          PARENTS - please remember to bring a copy of your child's immunization record to all healthcare visits (office, Emergency Department, etc.)

## 2018-08-23 ENCOUNTER — Other Ambulatory Visit (HOSPITAL_BASED_OUTPATIENT_CLINIC_OR_DEPARTMENT_OTHER): Payer: Self-pay | Admitting: Internal Medicine

## 2018-08-23 DIAGNOSIS — Z9884 Bariatric surgery status: Secondary | ICD-10-CM

## 2018-08-23 DIAGNOSIS — F32A Depression, unspecified: Secondary | ICD-10-CM

## 2018-08-23 DIAGNOSIS — F320 Major depressive disorder, single episode, mild: Principal | ICD-10-CM

## 2018-08-23 DIAGNOSIS — K219 Gastro-esophageal reflux disease without esophagitis: Secondary | ICD-10-CM

## 2018-08-23 MED FILL — HYDROCHLOROT 25MG: 30 days supply | Qty: 30 | Fill #1 | Status: CP

## 2018-08-23 MED FILL — OMEPRAZOLE   20MG: 30 days supply | Qty: 30 | Fill #3 | Status: CP

## 2018-08-23 MED FILL — VITAMIN D3 1000UNIT: 30 days supply | Qty: 30 | Fill #6 | Status: CP

## 2018-08-23 MED FILL — SIMVASTATIN 10MG: 30 days supply | Qty: 30 | Fill #1 | Status: CP

## 2018-08-23 MED FILL — LOSARTAN POT 100MG: 30 days supply | Qty: 30 | Fill #1 | Status: CP

## 2018-08-23 NOTE — Progress Notes (Signed)
[facility] Pharm[facility], Mariah [name] is a 62 year old female has requested a refill of ranitidine,ferrous gluconate, citalopram      Last Office Visit: 11/15/2017 with Jyl Heinz  Last Physical Exam: 08/24/2016    There are no preventive care reminders to display for this patient.    Other Med Adult:  Most Recent BP Reading(s)  07/09/18 : 134/82        Cholesterol (mg/dL)   Date Value   08/24/2016 190     LOW DENSITY LIPOPROTEIN DIRECT (mg/dL)   Date Value   08/24/2016 100     HIGH DENSITY LIPOPROTEIN (mg/dL)   Date Value   08/24/2016 71     TRIGLYCERIDES (mg/dL)   Date Value   08/24/2016 95         THYROID SCREEN TSH REFLEX FT4 (uIU/mL)   Date Value   06/09/2014 2.040         TSH (THYROID STIM HORMONE) (uIU/mL)   Date Value   04/03/2012 1.68       No results found for: HGBA1C    No results found for: POCA1C      INR (no units)   Date Value   04/28/2014 < 1.0   01/18/2007 1.0 (L)       SODIUM (mmol/L)   Date Value   11/15/2017 142       POTASSIUM (mmol/L)   Date Value   11/15/2017 4.3           CREATININE (mg/dL)   Date Value   11/15/2017 0.8       Documented patient preferred pharmacies:    [place], [place], [place] - [place]  Phone: [phone] Fax: [phone]

## 2018-09-02 MED FILL — RANITIDINE 150MG: 30 days supply | Qty: 60 | Fill #0 | Status: CP

## 2018-09-02 MED FILL — CITALOPRAM 10MG: 30 days supply | Qty: 30 | Fill #0 | Status: CP

## 2018-09-02 MED FILL — FERROUS GLUC 324MG (38 MG): 30 days supply | Qty: 60 | Fill #0 | Status: CP

## 2018-09-18 ENCOUNTER — Telehealth (HOSPITAL_BASED_OUTPATIENT_CLINIC_OR_DEPARTMENT_OTHER): Payer: Self-pay

## 2018-09-18 NOTE — Progress Notes (Signed)
[facility]), attempted to contact [name], 62 year old, today via phone call. [facility] was unable to reach patient and left voicemail requesting a call back. Contact information for [facility] left on voicemail.      Reason for Call: Depression Outreach   Attempt Number:  [place], 09/18/2018

## 2018-09-20 MED FILL — VITAMIN D3 1000UNIT: 30 days supply | Qty: 30 | Fill #7 | Status: CP

## 2018-09-20 MED FILL — SIMVASTATIN 10MG: 30 days supply | Qty: 30 | Fill #2 | Status: CP

## 2018-09-20 MED FILL — HYDROCHLOROT 25MG: 30 days supply | Qty: 30 | Fill #2 | Status: CP

## 2018-09-20 MED FILL — OMEPRAZOLE   20MG: 30 days supply | Qty: 30 | Fill #4 | Status: CP

## 2018-09-20 MED FILL — LOSARTAN POT 100MG: 30 days supply | Qty: 30 | Fill #2 | Status: CP

## 2018-09-26 ENCOUNTER — Encounter (HOSPITAL_BASED_OUTPATIENT_CLINIC_OR_DEPARTMENT_OTHER): Payer: Self-pay | Admitting: Registered Nurse

## 2018-10-02 ENCOUNTER — Ambulatory Visit (HOSPITAL_BASED_OUTPATIENT_CLINIC_OR_DEPARTMENT_OTHER): Payer: Self-pay

## 2018-10-08 ENCOUNTER — Ambulatory Visit
Admission: RE | Admit: 2018-10-08 | Discharge: 2018-10-08 | Disposition: A | Discharge status: Home / Self Care | Payer: No Typology Code available for payment source | Attending: Internal Medicine | Admitting: Internal Medicine

## 2018-10-08 ENCOUNTER — Encounter (HOSPITAL_BASED_OUTPATIENT_CLINIC_OR_DEPARTMENT_OTHER): Payer: Self-pay

## 2018-10-08 DIAGNOSIS — Z1231 Encounter for screening mammogram for malignant neoplasm of breast: Secondary | ICD-10-CM | POA: Diagnosis present

## 2018-10-08 DIAGNOSIS — Z1239 Encounter for other screening for malignant neoplasm of breast: Secondary | ICD-10-CM

## 2018-10-09 DIAGNOSIS — Z1231 Encounter for screening mammogram for malignant neoplasm of breast: Secondary | ICD-10-CM

## 2018-11-13 ENCOUNTER — Other Ambulatory Visit (HOSPITAL_BASED_OUTPATIENT_CLINIC_OR_DEPARTMENT_OTHER): Payer: Self-pay | Admitting: Internal Medicine

## 2018-11-13 DIAGNOSIS — R5383 Other fatigue: Secondary | ICD-10-CM

## 2018-11-13 DIAGNOSIS — E559 Vitamin D deficiency, unspecified: Secondary | ICD-10-CM

## 2018-11-13 MED ORDER — CHOLECALCIFEROL 25 MCG (1000 UT) PO TABS: 1000 [IU] | tablet | Freq: Every day | ORAL | 11 refills | 0 days | Status: AC

## 2018-11-13 MED ORDER — CHOLECALCIFEROL 25 MCG (1000 UT) PO TABS
1000.0000 [IU] | ORAL_TABLET | Freq: Every day | ORAL | 11 refills | Status: DC
Start: 2018-11-13 — End: 2020-02-03

## 2018-11-13 MED FILL — LOSARTAN POT 100MG: 30 days supply | Qty: 30 | Fill #3 | Status: CP

## 2018-11-13 MED FILL — OMEPRAZOLE   20MG: 30 days supply | Qty: 30 | Fill #5 | Status: CP

## 2018-11-13 MED FILL — HYDROCHLOROT 25MG: 30 days supply | Qty: 30 | Fill #3 | Status: CP

## 2018-11-13 MED FILL — CITALOPRAM 10MG: 30 days supply | Qty: 30 | Fill #1 | Status: CP

## 2018-11-13 MED FILL — FERROUS GLUC 324MG (38 MG): 30 days supply | Qty: 60 | Fill #1 | Status: CP

## 2018-11-13 MED FILL — SIMVASTATIN 10MG: 30 days supply | Qty: 30 | Fill #3 | Status: CP

## 2018-11-13 MED FILL — VITAMIN D3  1000UNIT: 30 days supply | Qty: 30 | Fill #0

## 2018-11-13 MED FILL — FERROUS GLUC 324MG (38 MG): 30 days supply | Qty: 60 | Fill #1

## 2018-11-13 MED FILL — OMEPRAZOLE   20MG: 30 days supply | Qty: 30 | Fill #5

## 2018-11-13 MED FILL — HYDROCHLOROT 25MG: 30 days supply | Qty: 30 | Fill #3

## 2018-11-13 MED FILL — LOSARTAN POT 100MG: 30 days supply | Qty: 30 | Fill #3

## 2018-11-13 MED FILL — SIMVASTATIN 10MG: 30 days supply | Qty: 30 | Fill #3

## 2018-11-13 MED FILL — CITALOPRAM 10MG: 30 days supply | Qty: 30 | Fill #1

## 2018-11-13 MED FILL — RANITIDINE 150MG: 30 days supply | Qty: 60 | Fill #1

## 2018-11-13 NOTE — Progress Notes (Signed)
[facility] Pharmacy, Mariah [name] is a 62 year old female has requested a refill of         -  VITAMIN D 1000       Last Office Visit: 11/15/17 with Jyl Heinz.  Last Physical Exam: 08/24/16     There are no preventive care reminders to display for this patient.     Other Med Adult:  Most Recent BP Reading(s)  07/09/18 : 134/82        Cholesterol (mg/dL)   Date Value   08/24/2016 190     LOW DENSITY LIPOPROTEIN DIRECT (mg/dL)   Date Value   08/24/2016 100     HIGH DENSITY LIPOPROTEIN (mg/dL)   Date Value   08/24/2016 71     TRIGLYCERIDES (mg/dL)   Date Value   08/24/2016 95         THYROID SCREEN TSH REFLEX FT4 (uIU/mL)   Date Value   06/09/2014 2.040         TSH (THYROID STIM HORMONE) (uIU/mL)   Date Value   04/03/2012 1.68       No results found for: HGBA1C    No results found for: POCA1C      INR (no units)   Date Value   04/28/2014 < 1.0   01/18/2007 1.0 (L)       SODIUM (mmol/L)   Date Value   11/15/2017 142       POTASSIUM (mmol/L)   Date Value   11/15/2017 4.3           CREATININE (mg/dL)   Date Value   11/15/2017 0.8        Documented patient preferred pharmacies:    [place], [place], [place] - [place]  Phone: [phone] Fax: [phone]

## 2018-11-13 NOTE — Progress Notes (Signed)
[facility]    Person calling on behalf of patient: Patient (self)    May list multiple medications in this section    Medicine Name: cholecalciferol (VITAMIN D3) 1000 UNIT tablet    Dosage:     Frequency (how many pills, how many times a day):     Number of pills left:     Documented patient preferred pharmacies:   [place], [place], [place]  Phone: [phone] Fax: [phone]

## 2018-11-15 ENCOUNTER — Other Ambulatory Visit (HOSPITAL_BASED_OUTPATIENT_CLINIC_OR_DEPARTMENT_OTHER): Payer: Self-pay | Admitting: Internal Medicine

## 2018-11-15 DIAGNOSIS — K219 Gastro-esophageal reflux disease without esophagitis: Secondary | ICD-10-CM

## 2018-11-15 MED ORDER — FAMOTIDINE 20 MG PO TABS
20.00 mg | ORAL_TABLET | Freq: Two times a day (BID) | ORAL | 5 refills | Status: AC
Start: 2018-11-15 — End: 2019-05-14

## 2018-11-15 MED ORDER — FAMOTIDINE 20 MG PO TABS: 20 mg | tablet | Freq: Two times a day (BID) | ORAL | 5 refills | 0 days | Status: AC

## 2018-11-15 MED FILL — FAMOTIDINE  20MG: 30 days supply | Qty: 60 | Fill #0

## 2018-11-28 ENCOUNTER — Ambulatory Visit (HOSPITAL_BASED_OUTPATIENT_CLINIC_OR_DEPARTMENT_OTHER): Payer: Self-pay | Admitting: Internal Medicine

## 2018-12-16 MED FILL — *VITAMIN D3  1000UNIT: 30 days supply | Qty: 30 | Fill #0 | Status: CP

## 2018-12-16 MED FILL — OMEPRAZOLE   20MG: 30 days supply | Qty: 30 | Fill #6 | Status: CP

## 2018-12-16 MED FILL — SIMVASTATIN 10MG: 30 days supply | Qty: 30 | Fill #4 | Status: CP

## 2018-12-16 MED FILL — FERROUS GLUC 324MG (38 MG): 30 days supply | Qty: 60 | Fill #2 | Status: CP

## 2018-12-16 MED FILL — LOSARTAN POT 100MG: 30 days supply | Qty: 30 | Fill #4 | Status: CP

## 2018-12-16 MED FILL — HYDROCHLOROT 25MG: 30 days supply | Qty: 30 | Fill #4 | Status: CP

## 2018-12-16 MED FILL — CITALOPRAM 10MG: 30 days supply | Qty: 30 | Fill #2 | Status: CP

## 2018-12-16 MED FILL — OMEPRAZOLE   20MG: 30 days supply | Qty: 30 | Fill #6

## 2018-12-16 MED FILL — VITAMIN D3  1000UNIT: 30 days supply | Qty: 30 | Fill #0

## 2018-12-16 MED FILL — CITALOPRAM 10MG: 30 days supply | Qty: 30 | Fill #2

## 2018-12-16 MED FILL — HYDROCHLOROT 25MG: 30 days supply | Qty: 30 | Fill #4

## 2018-12-16 MED FILL — LOSARTAN POT 100MG: 30 days supply | Qty: 30 | Fill #4

## 2018-12-16 MED FILL — SIMVASTATIN 10MG: 30 days supply | Qty: 30 | Fill #4

## 2018-12-16 MED FILL — FERROUS GLUC 324MG (38 MG): 30 days supply | Qty: 60 | Fill #2

## 2018-12-27 ENCOUNTER — Ambulatory Visit: Payer: No Typology Code available for payment source | Attending: Internal Medicine | Admitting: Internal Medicine

## 2018-12-27 VITALS — BP 137/84 | HR 69 | Temp 98.0°F | Ht 63.5 in | Wt 174.0 lb

## 2018-12-27 DIAGNOSIS — Z0000 Encounter for general adult medical examination without abnormal findings: Secondary | ICD-10-CM | POA: Diagnosis not present

## 2018-12-27 DIAGNOSIS — I119 Hypertensive heart disease without heart failure: Secondary | ICD-10-CM

## 2018-12-27 DIAGNOSIS — Z9884 Bariatric surgery status: Secondary | ICD-10-CM

## 2018-12-27 DIAGNOSIS — Z13228 Encounter for screening for other metabolic disorders: Secondary | ICD-10-CM

## 2018-12-27 DIAGNOSIS — F32A Depression, unspecified: Secondary | ICD-10-CM

## 2018-12-27 DIAGNOSIS — Z87891 Personal history of nicotine dependence: Secondary | ICD-10-CM | POA: Diagnosis present

## 2018-12-27 DIAGNOSIS — F320 Major depressive disorder, single episode, mild: Secondary | ICD-10-CM

## 2018-12-27 DIAGNOSIS — Z129 Encounter for screening for malignant neoplasm, site unspecified: Secondary | ICD-10-CM | POA: Diagnosis present

## 2018-12-27 NOTE — Progress Notes (Signed)
SUBJECTIVE  Mariah [name] is a 63 year old female, [place] speaker, for routine physical exam.     c/o  R deltoid pain for over 1 year, since she had a tetanus shot. She has full ROM, but it hurts. No paresthesia.    S/p bariatric surgery, 2017, feels "wonderful". Weight at where she wanted it to be.  Has overall, more energy    Most Recent Weight Reading(s)  12/27/18 : 78.9 kg (174 lb)  10/08/18 : 81.6 kg (180 lb)  11/15/17 : 80.9 kg (178 lb 4 oz)  10/25/17 : 80.7 kg (177 lb 14.4 oz)  09/06/17 : 80.3 kg (177 lb)    Most Recent BP Reading(s)  12/27/18 : 137/84  07/09/18 : 134/82  04/09/18 : 147/77  11/15/17 : 156/85  10/25/17 : 158/75    Review of Patient's Allergies indicates:   Heparin                     Comment:local rash seen during hospitalization Feb 2008   Lisinopril              Cough    Current Outpatient Medications on File Prior to Visit:  famotidine (PEPCID) 20 MG tablet Take 1 tablet by mouth 2 (two) times daily Disp: 60 tablet Rfl: 5   cholecalciferol (VITAMIN D3) 1000 UNIT tablet Take 1 tablet by mouth daily Disp: 30 tablet Rfl: 11   citalopram (CELEXA) 10 MG tablet Take 1 tablet by mouth daily Disp: 30 tablet Rfl: 11   Ferrous Gluconate 324 (37.5 Fe) MG TABS TAKE 1 TABLET BY MOUTH 2 (TWO) TIMES DAILY Disp: 60 tablet Rfl: 11   losartan (COZAAR) 100 MG tablet Take 1 tablet by mouth daily Disp: 30 tablet Rfl: 11   hydrochlorothiazide (HYDRODIURIL) 25 MG tablet Take 1 tablet by mouth daily Disp: 30 tablet Rfl: 11   simvastatin (ZOCOR) 10 MG tablet Take 1 tablet by mouth nightly Disp: 30 tablet Rfl: 11   omeprazole (PRILOSEC) 20 MG capsule Take 1 capsule by mouth daily Disp: 30 capsule Rfl: 11   Biotin 1000 MCG TABS Tablet Take 1,000 mcg by mouth daily Disp: 30 tablet Rfl: 11     No current facility-administered medications on file prior to visit.   Patient Active Problem List:     History of bariatric surgery     LATERAL FOOT PAIN     Benign hypertensive heart disease without heart failure      Alkaline phosphatase elevation     Calculus of kidney     Gastritis     Allergic rhinitis due to other allergen     Osteoporosis     Murmur heart     Family history of throat cancer     Vitamin D deficiency     Subclavian artery stenosis, left (HCC)     Schatzki's ring     Duodenal ulcer, unspecified as acute or chronic, without hemorrhage, perforation, or obstruction     Mild depression ([place])     Glaucoma suspect of both eyes     Hyperopia with presbyopia of both eyes    Past Medical History:  03/27/2006: ABN FIND-STOOL CONTENTS-OCC BLOOD      Comment:  s/p colonoscopy and EGD 7/07  06/04/2006: ABSENCE OF MENSTRUATION - s/p TAH BSO  08/05/2004: ANXIETY DEPRESSION      Comment:  TSH wnl 8/02; trial of fluoxetine 8/05  08/05/2004: BACK PAIN LOW      Comment:  LBP musculoskeletal  most likely 1/04  04/23/2006: Cough      Comment:  s/p PFTs September 2007 --  some obstruction at mid to                low lung volumes not correctable by bronchodilator CT                scan showed stable pulmonary nodules over several years                11/07 - cough gone ** note combined with dx of "nodules                on CT scan". Will file this note to hx.     08/05/2004: depressive disorder      Comment:  TSH wnl 8/02; trial of fluoxetine x 6 wks didn't really                help 8/05  trial of citalopram per Dr. Patrice Paradise. later                resolved. 04/15/2014 - Reassessed, pt reports feeling                well, with no sx of depression. Just reports being tired,               which she attributes to current life style. Will resolve                from problem list.    03/27/2006: Diarrhea      Comment:  7/07 The colonoscopy was visually normal and biopsies                taken to rule out microscopic colitis were negative.  A                biopsy of the descending duodenum to rule out celiac                sprue because of the diarrhea was negative.    12/19/2012: Gastritis      Comment:  Combined with entry "esophageal reflux     10/22/2006: Hyperparathyroidism      Comment:  osteoporosis found - pt referred to surgery for the                hyperparathyroidism- s/p parathyroidectomy feb 2008  06/18/2012: Inconclusive mammogram      Comment:  June 2013 - BIRADS 3. Probable benign mammogram.                Recommend 6 month  follow-up mammogram (Dec 2013).                 Dec/13 - Stable left breast nodule with no evidence of                malignancy. BIRADS 2 benign recommend annual screening                mammogram in 6 months  (June 2014) June/14 - mammogram                request given to pt, she will call to schedule:  Jan/15 -               normal mammogram, yearly screening. Resolve from problem                list.        03/13/2006: Insomnia, unspecified  Comment:  06/25/2012 - intermittent, usually resolved by taking                Tylenol PM 06/09/2014 - good response to trazodone, takes                one to 4x wk, prn 08/24/2016 - reports no problems sleeping               - file to hx, and resolve from problem list.   03/27/2006: nodules on chest CT 1/06 and 3/07      Comment:  s/p PFTs September 2007 --  some obstruction at mid to                low lung volumes not correctable by bronchodilator CT                scan showed stable pulmonary nodules over several years                11/07 - cough gone  04/29/12 - saw pneumologist, Dr Jordan Hawks,               "Will repeat chest CT with high-resolution scans at the 2               month mark (already ordered). If abnormal, would do PFT's               and further rheum w/u, consider lung bx . Pt to call if                any r  08/05/2004: OBESITY NOS      Comment:  has tried meds from [place]  08/05/2004: PERS HX OF PAST NONCOMPLIANCE      Comment:  poor compliance to med regimen  08/05/2004: PERS HX TOBACCO USE      Comment:  quit 10/02; 1/2 PPD since 63 YO  08/05/2004: Screening for hypertension      Comment:  occ elevated BP started on anti-HTN in [place]; chem 7,                EKG,  HCT u/a wnl 4/03; d/c'ed HCTZ 8/05 when we learned                from daughter she has not been taking it  05/19/2008: Vitamin D deficiency      Comment:  Supplement per dr Jake Samples.  Past Surgical History:  04/2016: BARIATRIC SURGERY      Comment:  done at [facility]  2006: PARATHYROIDECTOMY      Comment:  partial,   5/00: SALPINGO-OOPHORECTOMY COMPL/PRTL UNI/BI SPX      Comment:  Salpingo-Oophorectomy, bilateral  No date: TONSILLECTOMY ONE-HALF <AGE 23  5/00: TOTAL ABDOMINAL HYSTERECT W/WO RMVL TUBE OVARY      Comment:  Hysterectomy, Total Abdominal secondary to fibroids  Review of patient's family history indicates:  Problem: No Known Problems      Relation: Father          Age of Onset: (Not Specified)  Problem: Cancer - Lung      Relation: Father          Age of Onset: (Not Specified)          Comment: died from lung CA- throat cancer per pt. Also on dyalisis               for 10 years  Problem: Hypertension      Relation: Mother  Age of Onset: (Not Specified)  Problem: Lipids      Relation: Mother          Age of Onset: (Not Specified)          Comment: hyperlipidemia  Problem: Heart      Relation: Maternal Uncle          Age of Onset: (Not Specified)          Comment: died 53 from MI  Problem: Diabetes      Relation: Maternal Aunt          Age of Onset: (Not Specified)  Problem: Diabetes      Relation: Mother          Age of Onset: (Not Specified)          Comment: also morbidly weight  Problem: Cancer - Other      Relation: Brother          Age of Onset: (Not Specified)          Comment: liver cancer, Sept 2013  Problem: Non-contributory      Relation: Brother          Age of Onset: (Not Specified)          Comment: no first deg rel. w/ breast ca  Problem: No Known Problems      Relation: Sister          Age of Onset: (Not Specified)  Problem: No Known Problems      Relation: Daughter          Age of Onset: (Not Specified)  Problem: No Known Problems      Relation: Maternal Grandmother           Age of Onset: (Not Specified)  Problem: No Known Problems      Relation: Maternal Grandfather          Age of Onset: (Not Specified)  Problem: No Known Problems      Relation: Paternal Grandmother          Age of Onset: (Not Specified)  Problem: No Known Problems      Relation: Paternal Grandfather          Age of Onset: (Not Specified)    Social History     Socioeconomic History    Marital status: Married     Spouse name: Not on file    Number of children: Not on file    Years of education: Not on file    Highest education level: Not on file   Occupational History    Not on file   Social Needs    Financial resource strain: Not on file    Food insecurity:     Worry: Not on file     Inability: Not on file    Transportation needs:     Medical: Not on file     Non-medical: Not on file   Tobacco Use    Smoking status: Former Smoker     Packs/day: 0.50     Years: 40.00     Pack years: 20.00     Types: Cigarettes     Last attempt to quit: 03/31/2012     Years since quitting: 6.7    Smokeless tobacco: Former Systems developer    Tobacco comment: 3-4 cigarretes a day. Quit about 1 month ago   Substance and Sexual Activity    Alcohol use: Yes     Comment: very occasional    Drug use: No  Sexual activity: Yes     Partners: Male     Comment: histerectomy   Lifestyle    Physical activity:     Days per week: Not on file     Minutes per session: Not on file    Stress: Not on file   Relationships    Social connections:     Talks on phone: Not on file     Gets together: Not on file     Attends religious service: Not on file     Active member of club or organization: Not on file     Attends meetings of clubs or organizations: Not on file     Relationship status: Not on file    Intimate partner violence:     Fear of current or ex partner: Not on file     Emotionally abused: Not on file     Physically abused: Not on file     Forced sexual activity: Not on file   Other Topics Concern    Not on file   Social History Narrative     work: Engineer, building services; owned bakery in [place]    Lives w/ daughter 63 YO (Grazielle Oviedo) & son-in-law Lowella Dandy)    Divorced; currently not in a relationship        05/27/2015    From [place]. Works as Electrical engineer, has own business.     Married, Djaniro.     No regular exercise    Diet: varies. Eats at irregular intervals. Tries to watch diet, but likes to eat    Stressors: work    Pleasures: family, grand children        08/24/2016    Same living and work situation. No regular exercise. Diet improved after bariatric surgery, and pt is very happy with results.         01/09/2019    No changes in social hx     Review of symptoms:   No fevers or unexplained weight loss. No visual changes. No sore throat or ear ache.  No prolonged cough. No dyspnea or chest pain on exertion.  No abdominal pain or change in bowel habits.  No vaginal discharge or dysuria.  No new or unusual musculoskeletal symptoms. No new rashes or skin changes.  No pain or masses in breasts. No paresthesias or unusual headaches. No sadness or anxiety that interferes with day-to-day activities. No hot flashes. No enlarged nodes.  No new itching, sneezing or wheezing.    OBJECTIVE:  BP 137/84 (Site: RA, Position: Sitting, Cuff Size: Reg)  Pulse 69  Temp 98 F (36.7 C) (Temporal)  Ht 5' 3.5" (1.613 m)  Wt 78.9 kg (174 lb)  SpO2 100%  BMI 30.34 kg/m2  General Appearance: The patient appears well, in NAD. Alert, well, developed, well nourished.    HEENT: Head: Normocephalic, atraumatic.   Eyes:  EOMs intact. Conjunctiva, cornea clear.No discharge.   Ears: No pain with movement of tragus. Ear canals normal. TMs clear bilaterally.   Nose/Sinuses: Patent, scant amount of clear mucus. No maxillary, frontal, ethmoid sinus tenderness.  Oropharynx: Lips, oral mucosa, tongue pink without gingival lesions. Pharynx without lesions, exudate.  Neck: Supple. No adenopathy or thyromegaly.    Respiratory: Bilateral lungs clear to auscultation, good air entry, no wheezes,  rhonchi or rales.   Cardiovascular: RRR. S1 and S2 normal, no murmurs, clicks, gallops or rubs. No JVD. No carotid bruit. No edema. Peripheral pulses intact.   GI: Soft,Normal BS present. Abdomen soft without  tenderness, guarding, mass or organomegaly.  Skin: Color, texture, turgor normal.  No rash, lesions.   Musculoskeletal: Spine ROM normal. Muscular strength intact.   Neuro: Gait normal. Reflexes normal and symmetric. Sensation grossly normal. CN 2-12 intact.     ASSESSMENT & PLAN:  (Z00.00) Encounter for preventive health examination  (primary encounter diagnosis)  63 yo F for outine check up  with unremarkable physical exam (except as noted below). Routine screening tests reviewed/ordered. HM updated.   For general health, stay well hydrated, eat healthy at regular intervals (foods rich in dietary fiber, low sugar concentration, minimally processed are always preferred), sleep at least 8h at night, exercise regularly.    (F32.0) Mild depression ([place])  Comment: Reports feeling well, denies current sx. Stable on SSRI  Plan: continue monitoring yearly    (Z12.9) Screening for cancer  (Q68.341) Former smoker  Comment: Comment: 40+p/yr hx. Quit 3 ys ago. Agrees for screening  Plan: CT LUNG CANCER SCREENING, COUNSELING TO DETERM         CT LUNG SCR ELIG        (Z13.228) Screening for metabolic disorder  Comment:   Plan: COMP METABOLIC PANEL, FASTING, HEMOGLOBIN A1C,         LIPID PANEL            (I11.9) Benign hypertensive heart disease without heart failure  Comment: BP at target, steady dose of losartan 100 mg + HCTZ 25 mg. No adverse rxn.   Plan: Continue    (D62.22) Hx of bariatric surgery  Comment: 2017, happy with results. Routine labs  Plan: HEMATOCRIT, VITAMIN D,25 HYDROXY, VITAMIN B12          The patient was ready to learn and no apparent learning or adherence barriers were identified. I explained the diagnosis and treatment plan, and the patient expressed understanding of the content. I attempted to  answer any questions regarding the diagnosis and the proposed treatment.    We discussed the patients current medications. The patient expressed understanding and no barriers to adherence were identified.    follow-up will be scheduled prn  she has been advised to call or return with any worsening or new problems

## 2018-12-30 ENCOUNTER — Ambulatory Visit: Payer: No Typology Code available for payment source | Attending: Internal Medicine | Admitting: Lab

## 2018-12-30 DIAGNOSIS — Z9884 Bariatric surgery status: Secondary | ICD-10-CM | POA: Diagnosis present

## 2018-12-30 DIAGNOSIS — Z13228 Encounter for screening for other metabolic disorders: Secondary | ICD-10-CM | POA: Diagnosis present

## 2018-12-30 LAB — HEMATOCRIT: HEMATOCRIT: 41.5 % (ref 34.1–44.9)

## 2018-12-30 NOTE — Progress Notes (Signed)
Lab draw  Mariah [name], [place], 12/30/2018

## 2018-12-31 ENCOUNTER — Other Ambulatory Visit (HOSPITAL_BASED_OUTPATIENT_CLINIC_OR_DEPARTMENT_OTHER): Payer: Self-pay | Admitting: Internal Medicine

## 2018-12-31 DIAGNOSIS — R7989 Other specified abnormal findings of blood chemistry: Secondary | ICD-10-CM

## 2018-12-31 DIAGNOSIS — Z9884 Bariatric surgery status: Secondary | ICD-10-CM

## 2018-12-31 LAB — COMP METABOLIC PANEL, FASTING
ALANINE AMINOTRANSFERASE: 58 U/L — ABNORMAL HIGH (ref 12–45)
ALBUMIN: 3.7 g/dL (ref 3.4–5.0)
ALKALINE PHOSPHATASE: 173 U/L — ABNORMAL HIGH (ref 45–117)
ANION GAP: 7 mmol/L (ref 5–15)
ASPARTATE AMINOTRANSFERASE: 40 U/L — ABNORMAL HIGH (ref 8–34)
BILIRUBIN TOTAL: 0.4 mg/dL (ref 0.2–1.0)
BUN (UREA NITROGEN): 21 mg/dL — ABNORMAL HIGH (ref 7–18)
CALCIUM: 9.1 mg/dL (ref 8.5–10.1)
CARBON DIOXIDE: 27 mmol/L (ref 21–32)
CHLORIDE: 107 mmol/L (ref 98–107)
CREATININE: 0.9 mg/dL (ref 0.4–1.2)
ESTIMATED GLOMERULAR FILT RATE: >60 mL/min (ref >60)
GLUCOSE FASTING: 157 mg/dL — ABNORMAL HIGH (ref 74–106)
POTASSIUM: 4.5 mmol/L (ref 3.5–5.1)
SODIUM: 141 mmol/L (ref 136–145)
TOTAL PROTEIN: 7.4 g/dL (ref 6.4–8.2)

## 2018-12-31 LAB — HEMOGLOBIN A1C
ESTIMATED AVERAGE GLUCOSE: 108 (ref 74–160)
HEMOGLOBIN A1C: 5.4 % (ref 4.0–5.6)

## 2018-12-31 LAB — IRON: IRON: 61 ug/dL (ref 50–170)

## 2018-12-31 LAB — LIPID PANEL
Cholesterol: 184 mg/dL (ref 0–239)
HIGH DENSITY LIPOPROTEIN: 65 mg/dL (ref 40–?)
LOW DENSITY LIPOPROTEIN DIRECT: 95 mg/dL (ref 0–189)
TRIGLYCERIDES: 220 mg/dL — ABNORMAL HIGH (ref 0–150)

## 2018-12-31 LAB — FOLATE: FOLATE: 19 ng/mL (ref 3.0–?)

## 2018-12-31 LAB — VITAMIN B12: VITAMIN B12: 749 pg/mL (ref 193–986)

## 2018-12-31 LAB — ADD ON CAN'T BE DONE REFERENCE

## 2018-12-31 LAB — VITAMIN D,25 HYDROXY: VITAMIN D,25 HYDROXY: 24 ng/mL — ABNORMAL LOW (ref 30.0–100.0)

## 2018-12-31 NOTE — Progress Notes (Signed)
Dear RN,    Please:    1. Create Telephone encounter for this patient.  2. Share with the patient   -- she has no diabetes  -- level of B12 is normal  -- Cholesterol is OK  -- No anemia    -- Liver function test, the enzymes are still elevated (this was noted in the past, attributed to fatty liver). I'd like to repeat a liver ultrasound to make sure we are not missing anything new.   I'm entering the referral today. Please have pt call Radiology [phone]) to schedule.     -- Vitamin D is a little on the low side (24). Please continue taking the daily supplementation.       Plan:  1. Abd ultrasound as above.     2. Type of Outreach: 3 phone calls and if unable to reach send letter     3. Document the conversation in the Telephone Encounter and close the encounter, no need to send back to me.     Thank you,  Lawernce Pitts, APRN

## 2019-01-01 ENCOUNTER — Telehealth (HOSPITAL_BASED_OUTPATIENT_CLINIC_OR_DEPARTMENT_OTHER): Payer: Self-pay | Admitting: Registered Nurse

## 2019-01-01 NOTE — Progress Notes (Signed)
Called pt, msg relayed, pt expressed understanding and has no further questions at this time.       Marquette Saa, APRN  [address] Adult Nurse Pool            Dear RN,     Please:    1. Create Telephone encounter for this patient.   2. Share with the patient   -- she has no diabetes   -- level of B12 is normal   -- Cholesterol is OK   -- No anemia     -- Liver function test, the enzymes are still elevated (this was noted in the past, attributed to fatty liver). I'd like to repeat a liver ultrasound to make sure we are not missing anything new.   I'm entering the referral today. Please have pt call Radiology [phone]) to schedule.     -- Vitamin D is a little on the low side (24). Please continue taking the daily supplementation.       Plan:   1. Abd ultrasound as above.      2. Type of Outreach: 3 phone calls and if unable to reach send letter     3. Document the conversation in the Telephone Encounter and close the encounter, no need to send back to me.     Thank you,   Lawernce Pitts, APRN

## 2019-01-09 ENCOUNTER — Encounter (HOSPITAL_BASED_OUTPATIENT_CLINIC_OR_DEPARTMENT_OTHER): Payer: Self-pay | Admitting: Internal Medicine

## 2019-01-16 MED FILL — HYDROCHLOROT 25MG: 30 days supply | Qty: 30 | Fill #5 | Status: CP

## 2019-01-16 MED FILL — CITALOPRAM 10MG: 30 days supply | Qty: 30 | Fill #3 | Status: CP

## 2019-01-16 MED FILL — RANITIDINE 150MG: 30 days supply | Qty: 60 | Fill #1 | Status: CP

## 2019-01-16 MED FILL — OMEPRAZOLE   20MG: 30 days supply | Qty: 30 | Fill #7 | Status: CP

## 2019-01-16 MED FILL — SIMVASTATIN 10MG: 30 days supply | Qty: 30 | Fill #5 | Status: CP

## 2019-01-16 MED FILL — *VITAMIN D3  1000UNIT: 30 days supply | Qty: 30 | Fill #1 | Status: CP

## 2019-01-16 MED FILL — FERROUS GLUC 324MG (38 MG): 30 days supply | Qty: 60 | Fill #3 | Status: CP

## 2019-01-16 MED FILL — LOSARTAN POT 100MG: 30 days supply | Qty: 30 | Fill #5 | Status: CP

## 2019-01-16 MED FILL — SIMVASTATIN 10MG: 30 days supply | Qty: 30 | Fill #5

## 2019-01-16 MED FILL — FERROUS GLUC 324MG (38 MG): 30 days supply | Qty: 60 | Fill #3

## 2019-01-16 MED FILL — CITALOPRAM 10MG: 30 days supply | Qty: 30 | Fill #3

## 2019-01-16 MED FILL — LOSARTAN POT 100MG: 30 days supply | Qty: 30 | Fill #5

## 2019-01-16 MED FILL — OMEPRAZOLE   20MG: 30 days supply | Qty: 30 | Fill #7

## 2019-01-16 MED FILL — VITAMIN D3  1000UNIT: 30 days supply | Qty: 30 | Fill #1

## 2019-01-16 MED FILL — HYDROCHLOROT 25MG: 30 days supply | Qty: 30 | Fill #5

## 2019-01-16 MED FILL — RANITIDINE 150MG: 30 days supply | Qty: 60 | Fill #1

## 2019-01-17 ENCOUNTER — Ambulatory Visit
Admission: RE | Admit: 2019-01-17 | Discharge: 2019-01-17 | Disposition: A | Discharge status: Home / Self Care | Payer: No Typology Code available for payment source | Attending: Internal Medicine | Admitting: Internal Medicine

## 2019-01-17 DIAGNOSIS — Z122 Encounter for screening for malignant neoplasm of respiratory organs: Secondary | ICD-10-CM | POA: Diagnosis not present

## 2019-01-17 DIAGNOSIS — Z129 Encounter for screening for malignant neoplasm, site unspecified: Secondary | ICD-10-CM

## 2019-01-17 DIAGNOSIS — R918 Other nonspecific abnormal finding of lung field: Secondary | ICD-10-CM | POA: Diagnosis not present

## 2019-01-17 DIAGNOSIS — F17210 Nicotine dependence, cigarettes, uncomplicated: Secondary | ICD-10-CM

## 2019-01-18 ENCOUNTER — Ambulatory Visit (HOSPITAL_BASED_OUTPATIENT_CLINIC_OR_DEPARTMENT_OTHER): Payer: No Typology Code available for payment source

## 2019-01-30 ENCOUNTER — Encounter (HOSPITAL_BASED_OUTPATIENT_CLINIC_OR_DEPARTMENT_OTHER): Payer: Self-pay | Admitting: Internal Medicine

## 2019-01-30 DIAGNOSIS — R918 Other nonspecific abnormal finding of lung field: Secondary | ICD-10-CM | POA: Insufficient documentation

## 2019-02-10 ENCOUNTER — Other Ambulatory Visit (HOSPITAL_BASED_OUTPATIENT_CLINIC_OR_DEPARTMENT_OTHER): Payer: Self-pay | Admitting: Internal Medicine

## 2019-02-10 ENCOUNTER — Ambulatory Visit
Admission: RE | Admit: 2019-02-10 | Discharge: 2019-02-10 | Disposition: A | Discharge status: Home / Self Care | Payer: No Typology Code available for payment source | Attending: Internal Medicine | Admitting: Internal Medicine

## 2019-02-10 DIAGNOSIS — N281 Cyst of kidney, acquired: Secondary | ICD-10-CM

## 2019-02-10 DIAGNOSIS — R7989 Other specified abnormal findings of blood chemistry: Secondary | ICD-10-CM | POA: Diagnosis not present

## 2019-02-10 DIAGNOSIS — K760 Fatty (change of) liver, not elsewhere classified: Secondary | ICD-10-CM | POA: Diagnosis not present

## 2019-02-11 ENCOUNTER — Encounter (HOSPITAL_BASED_OUTPATIENT_CLINIC_OR_DEPARTMENT_OTHER): Payer: Self-pay | Admitting: Internal Medicine

## 2019-02-11 DIAGNOSIS — R7989 Other specified abnormal findings of blood chemistry: Secondary | ICD-10-CM | POA: Insufficient documentation

## 2019-03-19 ENCOUNTER — Other Ambulatory Visit (HOSPITAL_BASED_OUTPATIENT_CLINIC_OR_DEPARTMENT_OTHER): Payer: Self-pay | Admitting: Internal Medicine

## 2019-03-19 DIAGNOSIS — K219 Gastro-esophageal reflux disease without esophagitis: Secondary | ICD-10-CM

## 2019-03-19 MED FILL — OMEPRAZOLE   20MG: 30 days supply | Qty: 30 | Fill #0 | Status: CP

## 2019-03-19 MED FILL — HYDROCHLOROT 25MG: 30 days supply | Qty: 30 | Fill #6 | Status: CP

## 2019-03-19 MED FILL — LOSARTAN POT 100MG: 30 days supply | Qty: 30 | Fill #6 | Status: CP

## 2019-03-19 MED FILL — SIMVASTATIN 10MG: 30 days supply | Qty: 30 | Fill #6 | Status: CP

## 2019-03-19 MED FILL — FERROUS GLUC 324MG (38 MG): 30 days supply | Qty: 60 | Fill #4 | Status: CP

## 2019-03-19 MED FILL — CITALOPRAM 10MG: 30 days supply | Qty: 30 | Fill #4 | Status: CP

## 2019-03-19 MED FILL — *VITAMIN D3  1000UNIT: 30 days supply | Qty: 30 | Fill #2 | Status: CP

## 2019-03-19 NOTE — Progress Notes (Signed)
PER Pharmacy, Mariah [name] is a 63 year old female has requested a refill of         -  OMEPRAZOLE       Last Office Visit: 12/27/18 with Jyl Heinz.  Last Physical Exam: 12/27/18     There are no preventive care reminders to display for this patient.     Other Med Adult:  Most Recent BP Reading(s)  12/27/18 : 137/84        Cholesterol (mg/dL)   Date Value   12/30/2018 184     LOW DENSITY LIPOPROTEIN DIRECT (mg/dL)   Date Value   12/30/2018 95     HIGH DENSITY LIPOPROTEIN (mg/dL)   Date Value   12/30/2018 65     TRIGLYCERIDES (mg/dL)   Date Value   12/30/2018 220 (H)         THYROID SCREEN TSH REFLEX FT4 (uIU/mL)   Date Value   06/09/2014 2.040         TSH (THYROID STIM HORMONE) (uIU/mL)   Date Value   04/03/2012 1.68       HEMOGLOBIN A1C (%)   Date Value   12/30/2018 5.4       No results found for: POCA1C      INR (no units)   Date Value   04/28/2014 < 1.0   01/18/2007 1.0 (L)       SODIUM (mmol/L)   Date Value   12/30/2018 141       POTASSIUM (mmol/L)   Date Value   12/30/2018 4.5           CREATININE (mg/dL)   Date Value   12/30/2018 0.9        Documented patient preferred pharmacies:    [facility], [place], [place] - [place]  Phone: [phone] Fax: [phone]

## 2019-05-19 ENCOUNTER — Other Ambulatory Visit (HOSPITAL_BASED_OUTPATIENT_CLINIC_OR_DEPARTMENT_OTHER): Payer: Self-pay | Admitting: Internal Medicine

## 2019-05-19 DIAGNOSIS — I119 Hypertensive heart disease without heart failure: Secondary | ICD-10-CM

## 2019-05-19 MED ORDER — LOSARTAN POTASSIUM 50 MG PO TABS
100.0000 mg | ORAL_TABLET | Freq: Every day | ORAL | 3 refills | Status: DC
Start: 2019-05-19 — End: 2020-06-07

## 2019-05-19 MED FILL — OMEPRAZOLE   20MG: 30 days supply | Qty: 30 | Fill #1 | Status: CP

## 2019-05-19 MED FILL — SIMVASTATIN 10MG: 30 days supply | Qty: 30 | Fill #7

## 2019-05-19 MED FILL — SIMVASTATIN 10MG: 30 days supply | Qty: 30 | Fill #7 | Status: CP

## 2019-05-19 MED FILL — OMEPRAZOLE   20MG: 30 days supply | Qty: 30 | Fill #1

## 2019-05-19 MED FILL — FERROUS GLUC 324MG (38 MG): 30 days supply | Qty: 60 | Fill #5

## 2019-05-19 MED FILL — HYDROCHLOROT 25MG: 30 days supply | Qty: 30 | Fill #7 | Status: CP

## 2019-05-19 MED FILL — CITALOPRAM 10MG: 30 days supply | Qty: 30 | Fill #5

## 2019-05-19 MED FILL — VITAMIN D3  1000UNIT: 30 days supply | Qty: 30 | Fill #3

## 2019-05-19 MED FILL — LOSARTAN POT 50MG: 90 days supply | Qty: 180 | Fill #0 | Status: CP

## 2019-05-19 MED FILL — LOSARTAN POT 50MG: 90 days supply | Qty: 180 | Fill #0

## 2019-05-19 MED FILL — FERROUS GLUC 324MG (38 MG): 30 days supply | Qty: 60 | Fill #5 | Status: CP

## 2019-05-19 MED FILL — HYDROCHLOROT 25MG: 30 days supply | Qty: 30 | Fill #7

## 2019-05-19 MED FILL — *VITAMIN D3  1000UNIT: 30 days supply | Qty: 30 | Fill #3 | Status: CP

## 2019-05-19 MED FILL — CITALOPRAM 10MG: 30 days supply | Qty: 30 | Fill #5 | Status: CP

## 2019-05-19 NOTE — Progress Notes (Signed)
pharmacy seeking alternative   Received: Today   Message Contents   Deenwood Stann Mainland, MD   Phone Number: [phone]            Hello,     Losartan 100mg  is currently on [facility] backorder with an expected availability date of mid to end of June. Pharmacy requesting to have a new prescription sent to the pharmacy for the 50mg  (2 tablets=TD of 100mg ) daily.

## 2019-06-13 ENCOUNTER — Other Ambulatory Visit (HOSPITAL_BASED_OUTPATIENT_CLINIC_OR_DEPARTMENT_OTHER): Payer: Self-pay | Admitting: Internal Medicine

## 2019-06-13 DIAGNOSIS — I119 Hypertensive heart disease without heart failure: Secondary | ICD-10-CM

## 2019-06-13 MED FILL — *VITAMIN D3  1000UNIT: 30 days supply | Qty: 30 | Fill #4 | Status: CP

## 2019-06-13 MED FILL — HYDROCHLOROT 25MG: 90 days supply | Qty: 90 | Fill #0 | Status: CP

## 2019-06-13 MED FILL — CITALOPRAM 10MG: 30 days supply | Qty: 30 | Fill #6 | Status: CP

## 2019-06-13 MED FILL — SIMVASTATIN 10MG: 90 days supply | Qty: 90 | Fill #0 | Status: CP

## 2019-06-13 MED FILL — FERROUS GLUC 324MG (38 MG): 30 days supply | Qty: 60 | Fill #6 | Status: CP

## 2019-06-13 NOTE — Progress Notes (Unsigned)
PER Pharmacy, Mariah [name] is a 63 year old female has requested a refill of simvastatin and hctz.      Last Office Visit: 12/27/2018 with pcp  Last Physical Exam: 12/27/2018    There are no preventive care reminders to display for this patient.    Other Med Adult:  Most Recent BP Reading(s)  12/27/18 : 137/84        Cholesterol (mg/dL)   Date Value   12/30/2018 184     LOW DENSITY LIPOPROTEIN DIRECT (mg/dL)   Date Value   12/30/2018 95     HIGH DENSITY LIPOPROTEIN (mg/dL)   Date Value   12/30/2018 65     TRIGLYCERIDES (mg/dL)   Date Value   12/30/2018 220 (H)         THYROID SCREEN TSH REFLEX FT4 (uIU/mL)   Date Value   06/09/2014 2.040         TSH (THYROID STIM HORMONE) (uIU/mL)   Date Value   04/03/2012 1.68       HEMOGLOBIN A1C (%)   Date Value   12/30/2018 5.4       No results found for: POCA1C      INR (no units)   Date Value   04/28/2014 < 1.0   01/18/2007 1.0 (L)       SODIUM (mmol/L)   Date Value   12/30/2018 141       POTASSIUM (mmol/L)   Date Value   12/30/2018 4.5           CREATININE (mg/dL)   Date Value   12/30/2018 0.9       Documented patient preferred pharmacies:    [facility], [place], [place] - [place]  Phone: [phone] Fax: [phone]    [facility] [place], [place] - [place].  Phone: [phone] Fax: [phone]

## 2019-07-22 MED FILL — FERROUS GLUC 324MG (38 MG): 30 days supply | Qty: 60 | Fill #7 | Status: CP

## 2019-07-22 MED FILL — OMEPRAZOLE   20MG: 30 days supply | Qty: 30 | Fill #2 | Status: CP

## 2019-07-22 MED FILL — CITALOPRAM 10MG: 30 days supply | Qty: 30 | Fill #7 | Status: CP

## 2019-07-22 MED FILL — VITAMIN D3 1000UNIT (25MCG): 30 days supply | Qty: 30 | Fill #5 | Status: CP

## 2019-07-30 ENCOUNTER — Ambulatory Visit
Admission: RE | Admit: 2019-07-30 | Discharge: 2019-07-30 | Disposition: A | Discharge status: Home / Self Care | Payer: No Typology Code available for payment source | Attending: Family Medicine | Admitting: Family Medicine

## 2019-07-30 ENCOUNTER — Telehealth (HOSPITAL_BASED_OUTPATIENT_CLINIC_OR_DEPARTMENT_OTHER): Payer: Self-pay | Admitting: Family Medicine

## 2019-07-30 DIAGNOSIS — Z20822 Contact with and (suspected) exposure to covid-19: Secondary | ICD-10-CM

## 2019-07-30 DIAGNOSIS — Z20828 Contact with and (suspected) exposure to other viral communicable diseases: Secondary | ICD-10-CM

## 2019-07-30 DIAGNOSIS — Z1159 Encounter for screening for other viral diseases: Secondary | ICD-10-CM | POA: Diagnosis not present

## 2019-07-30 NOTE — Progress Notes (Signed)
Covid-19

## 2019-07-31 ENCOUNTER — Encounter (HOSPITAL_BASED_OUTPATIENT_CLINIC_OR_DEPARTMENT_OTHER): Payer: Self-pay | Admitting: Internal Medicine

## 2019-07-31 LAB — COVID-19 OUTPATIENT
COVID-19 OUTPATIENT: NEGATIVE
DEVICE ID: 1382

## 2019-08-28 ENCOUNTER — Other Ambulatory Visit (HOSPITAL_BASED_OUTPATIENT_CLINIC_OR_DEPARTMENT_OTHER): Payer: Self-pay | Admitting: Internal Medicine

## 2019-08-28 DIAGNOSIS — Z9884 Bariatric surgery status: Secondary | ICD-10-CM

## 2019-08-28 MED FILL — LOSARTAN POT 50MG: 90 days supply | Qty: 180 | Fill #1

## 2019-08-28 MED FILL — VITAMIN D3 1000UNIT (25MCG): 30 days supply | Qty: 30 | Fill #6

## 2019-08-28 NOTE — Progress Notes (Signed)
PER Pharmacy, Mariah [name] is a 63 year old female has requested a refill of ferrous gluconate.      Last Office Visit: 12/27/2018 with Jyl Heinz  Last Physical Exam: 12/27/2018    There are no preventive care reminders to display for this patient.    Other Med Adult:  Most Recent BP Reading(s)  12/27/18 : 137/84        Cholesterol (mg/dL)   Date Value   12/30/2018 184     LOW DENSITY LIPOPROTEIN DIRECT (mg/dL)   Date Value   12/30/2018 95     HIGH DENSITY LIPOPROTEIN (mg/dL)   Date Value   12/30/2018 65     TRIGLYCERIDES (mg/dL)   Date Value   12/30/2018 220 (H)         THYROID SCREEN TSH REFLEX FT4 (uIU/mL)   Date Value   06/09/2014 2.040         TSH (THYROID STIM HORMONE) (uIU/mL)   Date Value   04/03/2012 1.68       HEMOGLOBIN A1C (%)   Date Value   12/30/2018 5.4       No results found for: POCA1C      INR (no units)   Date Value   04/28/2014 < 1.0   01/18/2007 1.0 (L)       SODIUM (mmol/L)   Date Value   12/30/2018 141       POTASSIUM (mmol/L)   Date Value   12/30/2018 4.5           CREATININE (mg/dL)   Date Value   12/30/2018 0.9       Documented patient preferred pharmacies:    [facility], [place], [place] - [place]  Phone: [phone] Fax: [phone]

## 2019-08-29 MED FILL — FERROUS GLUC 324MG (38 MG): 30 days supply | Qty: 60 | Fill #0

## 2019-09-01 ENCOUNTER — Other Ambulatory Visit: Payer: Self-pay

## 2019-09-01 ENCOUNTER — Telehealth (HOSPITAL_BASED_OUTPATIENT_CLINIC_OR_DEPARTMENT_OTHER): Payer: Self-pay | Admitting: Registered Nurse

## 2019-09-01 MED ORDER — FLUCONAZOLE 150 MG PO TABS
150.0000 mg | ORAL_TABLET | Freq: Once | ORAL | 0 refills | Status: DC
Start: 2019-09-01 — End: 2019-11-25

## 2019-09-01 MED FILL — FLUCONAZOLE 150MG: 1 days supply | Qty: 1 | Fill #0 | Status: CP

## 2019-09-01 NOTE — Progress Notes (Signed)
Called pt  -- vulvar itch, she's been scratching a lot, it hurts the skin. The area is very sensitive.  She's taking cranberry juice, and herbal teas  -- also washed the area with vinegar  -- not sure if there was initial vaginal itching, "everything itches"    Plan:  -- discussed scratch-itch cycle, urged pt to stop itching.   -- Apply cold compresses  -- OTC zinc oxide (desitin) to create a barrier to protect the skin  -- rx diflucan, as there may be an underlying yeast infection (other possibilities are vaginal atrophy, lichen chronicus)  -- stressed over and over the importance to stop trauma to the skin.    Pt agreed with plan

## 2019-09-01 NOTE — Progress Notes (Signed)
returned call to pt via interpretor ID # [id number]  Confirmed name and dob  Pt thinking she has a vaginal infection  Vaginal itching started one week ago  Pt tried vinegar with salt no relief  No vaginal discharge  No abd or lower back pain  Denies fever  No burning with urination  Last yeast infection was a long time ago    Please send RX to below pharmacy if possible      [facility], [place], [place] Phone:  [phone] Fax:  [phone]    Address:  [address] [place] [place] [address]        Will route to PCP          sick   Received: Today   [facility] [facility] Adult Nurse Pool   Phone Number: [phone]            Mariah [name] [id number], 63 year old, female     Calls today: Sick    What are the symptoms vaginal itchy   How long has patient been sick? For a week   What has pt. tried at home   Person calling on behalf of patient: Patient (self)     CALL BACK NUMBER: [phone]   Best time to call back:   Cell phone:   Other phone:     Patient's language of care: [place]     Patient needs a [place] interpreter.     Patient's PCP: Lawernce Pitts, APRN      Harrison Mons, RN 09/01/19 12:30 PM

## 2019-09-26 ENCOUNTER — Other Ambulatory Visit (HOSPITAL_BASED_OUTPATIENT_CLINIC_OR_DEPARTMENT_OTHER): Payer: Self-pay | Admitting: Internal Medicine

## 2019-09-26 DIAGNOSIS — I119 Hypertensive heart disease without heart failure: Secondary | ICD-10-CM

## 2019-09-26 MED FILL — HYDROCHLOROT 25MG: 90 days supply | Qty: 90 | Fill #1

## 2019-09-26 MED FILL — SIMVASTATIN 10MG: 90 days supply | Qty: 90 | Fill #1

## 2019-09-26 MED FILL — OMEPRAZOLE   20MG: 30 days supply | Qty: 30 | Fill #3

## 2019-09-26 NOTE — Progress Notes (Signed)
PER Pharmacy, Mariah [name] is a 63 year old female has requested a refill of     -  FLUCONAZOLE       Last Office Visit: 12/27/2018 with PCP  Last Physical Exam: 12/27/2018    There are no preventive care reminders to display for this patient.    Other Med Adult:  Most Recent BP Reading(s)  12/27/18 : 137/84        Cholesterol (mg/dL)   Date Value   12/30/2018 184     LOW DENSITY LIPOPROTEIN DIRECT (mg/dL)   Date Value   12/30/2018 95     HIGH DENSITY LIPOPROTEIN (mg/dL)   Date Value   12/30/2018 65     TRIGLYCERIDES (mg/dL)   Date Value   12/30/2018 220 (H)         THYROID SCREEN TSH REFLEX FT4 (uIU/mL)   Date Value   06/09/2014 2.040         TSH (THYROID STIM HORMONE) (uIU/mL)   Date Value   04/03/2012 1.68       HEMOGLOBIN A1C (%)   Date Value   12/30/2018 5.4       No results found for: POCA1C      INR (no units)   Date Value   04/28/2014 < 1.0   01/18/2007 1.0 (L)       SODIUM (mmol/L)   Date Value   12/30/2018 141       POTASSIUM (mmol/L)   Date Value   12/30/2018 4.5           CREATININE (mg/dL)   Date Value   12/30/2018 0.9       Documented patient preferred pharmacies:    [facility], [place], [place] - [place]  Phone: [phone] Fax: [phone]

## 2019-10-03 MED FILL — BABY DDROPS LIQ 400UNIT: 30 days supply | Qty: 3 | Fill #0

## 2019-10-22 MED FILL — SIMVASTATIN 10MG: 90 days supply | Qty: 90 | Fill #1

## 2019-10-22 MED FILL — HYDROCHLOROT 25MG: 90 days supply | Qty: 90 | Fill #1

## 2019-10-22 MED FILL — VITAMIN D3 1000UNIT TAB: 30 days supply | Qty: 30 | Fill #7

## 2019-10-22 MED FILL — OMEPRAZOLE   20MG: 30 days supply | Qty: 30 | Fill #3

## 2019-10-22 MED FILL — FERROUS GLUC 324MG (38 MG): 30 days supply | Qty: 60 | Fill #1

## 2019-11-20 MED FILL — FERROUS GLUC 324MG (38 MG): 30 days supply | Qty: 60 | Fill #2

## 2019-11-20 MED FILL — OMEPRAZOLE   20MG: 30 days supply | Qty: 30 | Fill #4

## 2019-11-24 MED FILL — LOSARTAN POT 50MG: 90 days supply | Qty: 180 | Fill #2 | Status: CP

## 2019-11-25 ENCOUNTER — Other Ambulatory Visit: Payer: Self-pay

## 2019-11-25 ENCOUNTER — Ambulatory Visit (HOSPITAL_BASED_OUTPATIENT_CLINIC_OR_DEPARTMENT_OTHER): Payer: Self-pay | Admitting: Registered Nurse

## 2019-11-25 DIAGNOSIS — N760 Acute vaginitis: Secondary | ICD-10-CM

## 2019-11-25 MED ORDER — FLUCONAZOLE 150 MG PO TABS
150.00 mg | ORAL_TABLET | Freq: Once | ORAL | 0 refills | Status: AC
Start: 2019-11-25 — End: 2019-11-25

## 2019-11-25 MED FILL — FLUCONAZOLE 150MG: 1 days supply | Qty: 1 | Fill #0

## 2019-11-25 NOTE — Telephone Encounter (Signed)
Pt requesting refill for Diflucan  Symptoms similar to what she experienced 08/2019  Symptoms then resolved after a dose of Diflucan  Reason for Disposition   Symptoms of a vaginal yeast infection (i.e., white, thick, cottage-cheese-like, itchy, not bad smelling discharge)    Answer Assessment - Initial Assessment Questions  1. DISCHARGE: "Describe the discharge." (e.g., white, yellow, green, gray, foamy, cottage cheese-like)      white  2. ODOR: "Is there a bad odor?"    no  3. ONSET: "When did the discharge begin?"     5 days ago  4. RASH: "Is there a rash in that area?" If so, ask: "Describe it." (e.g., redness, blisters, sores, bumps)     Pink labia /has beenscratching  5. ABDOMINAL PAIN: "Are you having any abdominal pain?" If yes: "What does it feel like? " (e.g., crampy, dull, intermittent, constant)     no  7. CAUSE: "What do you think is causing the discharge?" "Have you had the same problem before? What happened then?"     Like previous yeast infection  8. OTHER SYMPTOMS: "Do you have any other symptoms?" (e.g., fever, itching, vaginal bleeding, pain with urination, injury to genital area, vaginal foreign body)     itching  9. PREGNANCY: "Is there any chance you are pregnant?" "When was your last menstrual period?"    no    Protocols used: ADULT VAGINAL DISCHARGE-A-[place]

## 2019-11-25 NOTE — Telephone Encounter (Signed)
Regarding: vaginal itchy  ----- Message from [facility] sent at 11/25/2019 11:17 AM EST -----  Mariah [name] [id number], 63 year old, female    Calls today:  Sick    What are the symptoms vaginal itchy  How long has patient been sick? For 5 days  What has pt. tried at home   Person calling on behalf of patient: Patient (self)    CALL BACK NUMBER: [phone]  Best time to call back:   Cell phone:   Other phone:    Patient's language of care: [place]    Patient needs a [place] interpreter.    Patient's PCP: Lawernce Pitts, APRN

## 2019-11-26 ENCOUNTER — Other Ambulatory Visit (HOSPITAL_BASED_OUTPATIENT_CLINIC_OR_DEPARTMENT_OTHER): Payer: Self-pay | Admitting: Internal Medicine

## 2019-11-26 DIAGNOSIS — F320 Major depressive disorder, single episode, mild: Secondary | ICD-10-CM

## 2019-11-26 DIAGNOSIS — F32A Depression, unspecified: Secondary | ICD-10-CM

## 2019-11-26 NOTE — Telephone Encounter (Signed)
PER Pharmacy, Mariah [name] is a 63 year old female has requested a refill of citalopram.      Last Office Visit: 12/27/18 with Jyl Heinz  Last Physical Exam: 12/27/18    There are no preventive care reminders to display for this patient.    Other Med Adult:  Most Recent BP Reading(s)  12/27/18 : 137/84        Cholesterol (mg/dL)   Date Value   12/30/2018 184     LOW DENSITY LIPOPROTEIN DIRECT (mg/dL)   Date Value   12/30/2018 95     HIGH DENSITY LIPOPROTEIN (mg/dL)   Date Value   12/30/2018 65     TRIGLYCERIDES (mg/dL)   Date Value   12/30/2018 220 (H)         THYROID SCREEN TSH REFLEX FT4 (uIU/mL)   Date Value   06/09/2014 2.040         TSH (THYROID STIM HORMONE) (uIU/mL)   Date Value   04/03/2012 1.68       HEMOGLOBIN A1C (%)   Date Value   12/30/2018 5.4       No results found for: POCA1C      INR (no units)   Date Value   04/28/2014 < 1.0   01/18/2007 1.0 (L)       SODIUM (mmol/L)   Date Value   12/30/2018 141       POTASSIUM (mmol/L)   Date Value   12/30/2018 4.5           CREATININE (mg/dL)   Date Value   12/30/2018 0.9       Documented patient preferred pharmacies:      [facility], [place] - [place].  Phone: [phone] Fax: [phone]

## 2019-11-28 MED FILL — CITALOPRAM 10MG: 90 days supply | Qty: 90 | Fill #0

## 2019-12-25 ENCOUNTER — Other Ambulatory Visit: Payer: Self-pay

## 2020-01-02 MED FILL — HYDROCHLOROT 25MG: 90 days supply | Qty: 90 | Fill #2

## 2020-01-21 ENCOUNTER — Ambulatory Visit (HOSPITAL_BASED_OUTPATIENT_CLINIC_OR_DEPARTMENT_OTHER): Payer: No Typology Code available for payment source | Admitting: Internal Medicine

## 2020-02-02 NOTE — Progress Notes (Signed)
Mariah [name] is a 64 year old female, [place] speaker, last primary care visit in January/20.     HM: zoster, influenza  Lung CT FU for lung nodules 40mm (pended order)  ______________    Today with c/o:    #) Pt tested pos for COVID 01/26/20 after developing flu type sx. Husb tested neg on that day, but pos the following day. She's feeling better today, on day 9: sinus pressure resolved, fever resolved. Still a little weak, but markedly improved.     #) She'd like to have routine lab tests     #) HTN  Pt reports taking medications daily. Denies chest pain, SOB, DOE, palpitations, dizziness, head aches, leg edema. Does not check bp at home    #) other issues addressed under A&P    OBJECTIVE:  VS and PE deferred d/t COVID concerns  Pt speaking in full sentences in NAD    ASSESSMENT & PLAN:  (I11.9) Benign hypertensive heart disease without heart failure  (primary encounter diagnosis)  (Z13.228) Screening for metabolic disorder  Comment: Reports good compliance to meds, asymptomatic.   Due for routine yearly labs  Future orders in place, pt pos for COVID, msg sent to [facility] for lab only visit  Plan: COMP METABOLIC PANEL, FASTING, HEMOGLOBIN A1C,         LIPID PANEL, COLLECTION VENOUS BLOOD         VENIPUNCTURE    (R91.8) Abnormal CT lung screening  Comment: hx lung nodule, recommended 12 month fu  Plan: CT CHEST WO CONTRAST      (R53.83) Lack of energy  (E55.9) Hypovitaminosis D  Comment:refill  Plan: cholecalciferol (VITAMIN D3) 1000 UNIT tablet        (F32.0) Mild depression (HCC)  Comment: Admits long hx, and FHx of depression. Saw psychiatry at some point, rx citalopram 10 mg. Says she hasn't picked up refill in a few months, but refill still active. Plans to restart. Not interested in seeing psychiatry again, did not have good rapport with provider. Discussed possibility of therapy, available services reviewed, pt declines for now  Plan: Monitor    (U07.1) COVID-19 virus infection  Comment: DOI 8,  feeling better. Fever, myalgia, headaches are improving. Still with anosmia. Husb on day 7 with diarrhea, he's followed by CM  Plan: Reviewed normal course, recommended 10 day quarantine (pt is compliant). Denies respiratory problems. Sounds like a non complicated course.    The patient was ready to learn and no apparent learning or adherence barriers were identified. I explained the diagnosis and treatment plan, and the patient expressed understanding of the content. I attempted to answer any questions regarding the diagnosis and the proposed treatment.    We discussed the patients current medications.  We discussed the importance of medication compliance. The patient expressed understanding and no barriers to adherence were identified.    follow-up prn, per test results  she has been advised to call or return with any worsening or new problems

## 2020-02-03 ENCOUNTER — Ambulatory Visit: Payer: No Typology Code available for payment source | Attending: Internal Medicine | Admitting: Internal Medicine

## 2020-02-03 DIAGNOSIS — F32A Depression, unspecified: Secondary | ICD-10-CM

## 2020-02-03 DIAGNOSIS — Z13228 Encounter for screening for other metabolic disorders: Secondary | ICD-10-CM | POA: Diagnosis present

## 2020-02-03 DIAGNOSIS — E559 Vitamin D deficiency, unspecified: Secondary | ICD-10-CM | POA: Diagnosis present

## 2020-02-03 DIAGNOSIS — R918 Other nonspecific abnormal finding of lung field: Secondary | ICD-10-CM | POA: Insufficient documentation

## 2020-02-03 DIAGNOSIS — U071 COVID-19: Secondary | ICD-10-CM | POA: Insufficient documentation

## 2020-02-03 DIAGNOSIS — F320 Major depressive disorder, single episode, mild: Secondary | ICD-10-CM | POA: Diagnosis present

## 2020-02-03 DIAGNOSIS — I119 Hypertensive heart disease without heart failure: Secondary | ICD-10-CM | POA: Insufficient documentation

## 2020-02-03 DIAGNOSIS — R5383 Other fatigue: Secondary | ICD-10-CM | POA: Diagnosis present

## 2020-02-03 MED ORDER — CHOLECALCIFEROL 25 MCG (1000 UT) PO TABS
1000.0000 [IU] | ORAL_TABLET | Freq: Every day | ORAL | 11 refills | Status: DC
Start: 2020-02-03 — End: 2021-06-08

## 2020-02-05 ENCOUNTER — Other Ambulatory Visit: Payer: Self-pay

## 2020-02-10 ENCOUNTER — Other Ambulatory Visit: Payer: Self-pay

## 2020-02-11 ENCOUNTER — Other Ambulatory Visit: Payer: Self-pay

## 2020-02-11 ENCOUNTER — Ambulatory Visit: Payer: No Typology Code available for payment source | Attending: Internal Medicine

## 2020-02-11 DIAGNOSIS — Z13228 Encounter for screening for other metabolic disorders: Secondary | ICD-10-CM | POA: Diagnosis present

## 2020-02-11 LAB — LIPID PANEL
Cholesterol: 190 mg/dL (ref 0–239)
HIGH DENSITY LIPOPROTEIN: 56 mg/dL (ref 40–?)
LOW DENSITY LIPOPROTEIN DIRECT: 116 mg/dL (ref 0–189)
TRIGLYCERIDES: 128 mg/dL (ref 0–150)

## 2020-02-11 LAB — COMP METABOLIC PANEL, FASTING
ALANINE AMINOTRANSFERASE: 155 U/L — ABNORMAL HIGH (ref 12–45)
ALBUMIN: 3.7 g/dL (ref 3.4–5.0)
ALKALINE PHOSPHATASE: 457 U/L (ref 45–117)
ANION GAP: 11 mmol/L (ref 5–15)
ASPARTATE AMINOTRANSFERASE: 81 U/L — ABNORMAL HIGH (ref 8–34)
BILIRUBIN TOTAL: 0.4 mg/dL (ref 0.2–1.0)
BUN (UREA NITROGEN): 17 mg/dL (ref 7–18)
CALCIUM: 9.4 mg/dL (ref 8.5–10.1)
CARBON DIOXIDE: 23 mmol/L (ref 21–32)
CHLORIDE: 109 mmol/L — ABNORMAL HIGH (ref 98–107)
CREATININE: 0.7 mg/dL (ref 0.4–1.2)
ESTIMATED GLOMERULAR FILT RATE: >60 mL/min (ref >60)
GLUCOSE FASTING: 76 mg/dL (ref 74–106)
POTASSIUM: 4.3 mmol/L (ref 3.5–5.1)
SODIUM: 143 mmol/L (ref 136–145)
TOTAL PROTEIN: 7.7 g/dL (ref 6.4–8.2)

## 2020-02-11 NOTE — Progress Notes (Signed)
Released open orders.  Routine blood draw, 1 SST and 1 LAV tube.  Prepared specimens for courier.  Milas Gain, [place], 02/11/2020

## 2020-02-12 ENCOUNTER — Ambulatory Visit (HOSPITAL_BASED_OUTPATIENT_CLINIC_OR_DEPARTMENT_OTHER): Payer: Self-pay

## 2020-02-12 LAB — HEMOGLOBIN A1C
ESTIMATED AVERAGE GLUCOSE: 123 (ref 74–160)
HEMOGLOBIN A1C: 5.9 % — ABNORMAL HIGH (ref 4.0–5.6)

## 2020-02-13 ENCOUNTER — Telehealth (HOSPITAL_BASED_OUTPATIENT_CLINIC_OR_DEPARTMENT_OTHER): Payer: Self-pay | Admitting: Registered Nurse

## 2020-02-13 ENCOUNTER — Other Ambulatory Visit (HOSPITAL_BASED_OUTPATIENT_CLINIC_OR_DEPARTMENT_OTHER): Payer: Self-pay | Admitting: Internal Medicine

## 2020-02-13 DIAGNOSIS — R7989 Other specified abnormal findings of blood chemistry: Secondary | ICD-10-CM

## 2020-02-13 NOTE — Progress Notes (Signed)
Dear RN,    Please:    1. Create Telephone encounter for this patient.  2. Share with the patient:    Her cholesterol is very good.  Her diabetes test (hemoglobin A1c) is a little elevated. She DOES NOT have diabetes, but could be at risk for it. We should check again in one year.   Her liver function test is a little higher, compared to previous exams. I'd like to order an abdominal ultrasound, to have a better picture of the liver. There are orders in Epic. Please have her call radiology at [phone] to schedule it.   One of the enzymes that is elevated (Alk Phosphatase), could be elevated because of the gall bladder. Is she having any right upper quadrant pain, or any discomfort after eating foods containing fat?  (the ultrasound will show the gallbladder as well)      Plan:  1. As above    2. Type of Outreach: 3 phone calls and if unable to reach send letter     3. Document the conversation in the Telephone Encounter and close the encounter, no need to send back to me.     Thank you,  Lawernce Pitts, APRN

## 2020-02-13 NOTE — Telephone Encounter (Signed)
RN place call to patient via [place] interpreter.  Results and recommendations relayed per provider's request.  All questions answered.  Patient will call now for [place] appointment.  Grafton Folk, RN, 02/13/2020  1540      Dear RN,     Please:    1. Create Telephone encounter for this patient.   2. Share with the patient:     Her cholesterol is very good.   Her diabetes test (hemoglobin A1c) is a little elevated. She DOES NOT have diabetes, but could be at risk for it. We should check again in one year.   Her liver function test is a little higher, compared to previous exams. I'd like to order an abdominal ultrasound, to have a better picture of the liver. There are orders in Epic. Please have her call radiology at [phone] to schedule it.   One of the enzymes that is elevated (Alk Phosphatase), could be elevated because of the gall bladder. Is she having any right upper quadrant pain, or any discomfort after eating foods containing fat?   (the ultrasound will show the gallbladder as well)       Plan:   1. As above      2. Type of Outreach: 3 phone calls and if unable to reach send letter     3. Document the conversation in the Telephone Encounter and close the encounter, no need to send back to me.     Thank you,   Lawernce Pitts, APRN

## 2020-02-14 ENCOUNTER — Other Ambulatory Visit: Payer: Self-pay

## 2020-02-14 ENCOUNTER — Ambulatory Visit
Admission: RE | Admit: 2020-02-14 | Discharge: 2020-02-14 | Disposition: A | Discharge status: Home / Self Care | Payer: No Typology Code available for payment source | Attending: Internal Medicine | Admitting: Internal Medicine

## 2020-02-14 DIAGNOSIS — R918 Other nonspecific abnormal finding of lung field: Secondary | ICD-10-CM

## 2020-02-14 DIAGNOSIS — Z87891 Personal history of nicotine dependence: Secondary | ICD-10-CM | POA: Diagnosis not present

## 2020-02-16 ENCOUNTER — Other Ambulatory Visit: Payer: Self-pay

## 2020-02-17 ENCOUNTER — Ambulatory Visit
Admission: RE | Admit: 2020-02-17 | Discharge: 2020-02-17 | Disposition: A | Discharge status: Home / Self Care | Payer: No Typology Code available for payment source | Attending: Internal Medicine | Admitting: Internal Medicine

## 2020-02-17 ENCOUNTER — Other Ambulatory Visit (HOSPITAL_BASED_OUTPATIENT_CLINIC_OR_DEPARTMENT_OTHER): Payer: Self-pay | Admitting: Internal Medicine

## 2020-02-17 DIAGNOSIS — R7989 Other specified abnormal findings of blood chemistry: Secondary | ICD-10-CM | POA: Insufficient documentation

## 2020-02-17 DIAGNOSIS — N281 Cyst of kidney, acquired: Secondary | ICD-10-CM | POA: Diagnosis not present

## 2020-02-17 DIAGNOSIS — K760 Fatty (change of) liver, not elsewhere classified: Secondary | ICD-10-CM | POA: Diagnosis not present

## 2020-02-25 ENCOUNTER — Other Ambulatory Visit (HOSPITAL_BASED_OUTPATIENT_CLINIC_OR_DEPARTMENT_OTHER): Payer: Self-pay | Admitting: Internal Medicine

## 2020-02-25 DIAGNOSIS — F32A Depression, unspecified: Secondary | ICD-10-CM

## 2020-02-25 DIAGNOSIS — F320 Major depressive disorder, single episode, mild: Secondary | ICD-10-CM

## 2020-02-25 MED FILL — CITALOPRAM 10MG: 90 days supply | Qty: 90 | Fill #0

## 2020-02-25 MED FILL — LOSARTAN POT 50MG: 90 days supply | Qty: 180 | Fill #3

## 2020-02-25 MED FILL — HYDROCHLOROT 25MG: 90 days supply | Qty: 90 | Fill #2

## 2020-02-25 MED FILL — OMEPRAZOLE   20MG: 30 days supply | Qty: 30 | Fill #5

## 2020-02-25 MED FILL — FERROUS GLUC 324MG (38 MG): 30 days supply | Qty: 60 | Fill #3

## 2020-02-25 MED FILL — SIMVASTATIN 10MG: 90 days supply | Qty: 90 | Fill #2

## 2020-02-27 MED FILL — VITAMIN D3 1000UNIT TAB: 30 days supply | Qty: 30 | Fill #0

## 2020-03-02 ENCOUNTER — Other Ambulatory Visit: Payer: Self-pay | Admitting: Pediatrics

## 2020-03-08 ENCOUNTER — Other Ambulatory Visit: Payer: Self-pay

## 2020-03-10 ENCOUNTER — Ambulatory Visit: Payer: No Typology Code available for payment source | Attending: Internal Medicine | Admitting: Internal Medicine

## 2020-03-10 DIAGNOSIS — R918 Other nonspecific abnormal finding of lung field: Secondary | ICD-10-CM | POA: Diagnosis present

## 2020-03-10 DIAGNOSIS — F419 Anxiety disorder, unspecified: Secondary | ICD-10-CM | POA: Diagnosis present

## 2020-03-10 MED ORDER — ESCITALOPRAM OXALATE 10 MG PO TABS
10.0000 mg | ORAL_TABLET | Freq: Every day | ORAL | 2 refills | Status: DC
Start: 2020-03-10 — End: 2020-04-28

## 2020-03-10 NOTE — Progress Notes (Signed)
Mariah [name] is a 64 year old female, [place] speaker, FU from televisit 02/03/20:    Today with c/o    #) Sinus pressure after COVID-19 (early Feb), constant. Tylenol is helpful. Sometimes she feels her nose congested, not always. Has a headache, periorbital. Not every day.     #) Anxiety, emotional eating  Not sure what's triggering it, "life is good, family is doing well". Very anxious about COVID pandemic. 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 Misses going to the mall, eating out, or going for a walk.     #)  Has vaccine scheduled 3/29     Data review:  Abd US  IMPRESSION:     Hepatic steatosis. Co-existing hepatic fibrosis or cirrhosis cannot be excluded by sonographic imaging.   Bilateral renal cysts     CT lung screening  IMPRESSION:     Multiple pulmonary nodules largest measuring 6 mm, unchanged compared to prior CT scan.     LUNG-RADS category: 2 - Benign appearance or behavior   Modifier: S - Clinically significant finding     RECOMMENDATIONS: LDCT 1 Yr - Continue annual screening with LDCT chest in 12 months based on ACR Lung-RADS Version 1.1.     (S): Multiple multiple solid and groundglass nodular opacities throughout the lungs in the upper lobes, and lower lobes, right middle lobe and lingula. These may represent infectious or inflammatory nodules. Short-term follow-up with a low dose CT chest is suggested in 8-12 weeks to evaluate for stability.     (After 4/24)    OBJECTIVE:  VS and PE deferred d/t COVID pandemic  Pt speaking in full sentences in NAD    ASSESSMENT & PLAN:  (F41.9) Anxiety  (primary encounter diagnosis)  Comment: Supportive listening. Sounds like with the pandemic pt lost her "happy places" (ie, going to the mall, eating out, seeing friends)  We reviewed other ways of self care  Hopeful that she's booked for the COVID vaccine 3/29 (wasn't aware)  Agrees to start escitalopram, chosen d/t less risk for wt gain  Start  1/2 tab x 1 wk, then 1 full tab. Expect gradual resolution of sx.   [facility] referral discussed, pt declines  Regular physical activity encouraged  Plan: escitalopram (LEXAPRO) 10 MG tablet            (R91.8) Abnormal CT lung screening  Comment: results reviewed, pt will call to schedule FU after 4/24  Plan: CT CHEST    30 min visit  FU with me in 4 wk, msg sent to front end

## 2020-03-12 ENCOUNTER — Telehealth (HOSPITAL_BASED_OUTPATIENT_CLINIC_OR_DEPARTMENT_OTHER): Payer: Self-pay | Admitting: Internal Medicine

## 2020-03-12 NOTE — Telephone Encounter (Signed)
Unable to reach after multiple attempts.Left message to call and schedule televisit with Mariah [name].

## 2020-03-15 ENCOUNTER — Other Ambulatory Visit: Payer: Self-pay

## 2020-03-15 ENCOUNTER — Ambulatory Visit: Payer: 344 | Attending: Pediatrics

## 2020-03-15 DIAGNOSIS — Z23 Encounter for immunization: Secondary | ICD-10-CM | POA: Diagnosis present

## 2020-03-17 MED FILL — DICLOFENAC SOD GL 1% 100GM TOP: 25 days supply | Qty: 100 | Fill #0 | Status: CP

## 2020-04-01 ENCOUNTER — Other Ambulatory Visit (HOSPITAL_BASED_OUTPATIENT_CLINIC_OR_DEPARTMENT_OTHER): Payer: Self-pay | Admitting: Internal Medicine

## 2020-04-01 DIAGNOSIS — K219 Gastro-esophageal reflux disease without esophagitis: Secondary | ICD-10-CM

## 2020-04-01 MED FILL — OMEPRAZOLE   20MG: 30 days supply | Qty: 30 | Fill #0

## 2020-04-01 NOTE — Telephone Encounter (Incomplete)
PER Pharmacy, Mariah [name] is a 64 year old female has requested a refill of      -  Omeprazole        Last Office Visit: 12/27/2018   with pcp   Last Physical Exam: 12/27/2018     There are no preventive care reminders to display for this patient.     Other Med Adult:  Most Recent BP Reading(s)  12/27/18 : 137/84        Cholesterol (mg/dL)   Date Value   02/11/2020 190     LOW DENSITY LIPOPROTEIN DIRECT (mg/dL)   Date Value   02/11/2020 116     HIGH DENSITY LIPOPROTEIN (mg/dL)   Date Value   02/11/2020 56     TRIGLYCERIDES (mg/dL)   Date Value   02/11/2020 128         THYROID SCREEN TSH REFLEX FT4 (uIU/mL)   Date Value   06/09/2014 2.040         TSH (THYROID STIM HORMONE) (uIU/mL)   Date Value   04/03/2012 1.68       HEMOGLOBIN A1C (%)   Date Value   02/11/2020 5.9 (H)       No results found for: POCA1C      INR (no units)   Date Value   04/28/2014 < 1.0   01/18/2007 1.0 (L)       SODIUM (mmol/L)   Date Value   02/11/2020 143       POTASSIUM (mmol/L)   Date Value   02/11/2020 4.3           CREATININE (mg/dL)   Date Value   02/11/2020 0.7        Documented patient preferred pharmacies:    [facility], [place], [place] - [place]  Phone: [phone] Fax: [phone]

## 2020-04-05 ENCOUNTER — Other Ambulatory Visit: Payer: Self-pay

## 2020-04-05 ENCOUNTER — Ambulatory Visit: Payer: 344 | Attending: Internal Medicine

## 2020-04-05 DIAGNOSIS — Z23 Encounter for immunization: Secondary | ICD-10-CM | POA: Diagnosis present

## 2020-04-20 MED FILL — *IBUPROFEN SUS 100/5ML: 5 days supply | Qty: 150 | Fill #0

## 2020-04-23 ENCOUNTER — Other Ambulatory Visit: Payer: Self-pay

## 2020-04-23 ENCOUNTER — Ambulatory Visit
Admission: RE | Admit: 2020-04-23 | Discharge: 2020-04-23 | Disposition: A | Discharge status: Home / Self Care | Payer: No Typology Code available for payment source | Attending: Internal Medicine | Admitting: Internal Medicine

## 2020-04-23 DIAGNOSIS — R918 Other nonspecific abnormal finding of lung field: Secondary | ICD-10-CM | POA: Insufficient documentation

## 2020-04-23 DIAGNOSIS — Z87891 Personal history of nicotine dependence: Secondary | ICD-10-CM | POA: Diagnosis present

## 2020-04-28 ENCOUNTER — Ambulatory Visit: Payer: No Typology Code available for payment source | Attending: Internal Medicine | Admitting: Internal Medicine

## 2020-04-28 DIAGNOSIS — R918 Other nonspecific abnormal finding of lung field: Secondary | ICD-10-CM

## 2020-04-28 DIAGNOSIS — I119 Hypertensive heart disease without heart failure: Secondary | ICD-10-CM | POA: Diagnosis present

## 2020-04-28 DIAGNOSIS — F419 Anxiety disorder, unspecified: Secondary | ICD-10-CM | POA: Diagnosis present

## 2020-04-28 MED ORDER — ESCITALOPRAM OXALATE 10 MG PO TABS
10.0000 mg | ORAL_TABLET | Freq: Every day | ORAL | 5 refills | Status: DC
Start: 2020-04-28 — End: 2020-06-14

## 2020-04-28 NOTE — Progress Notes (Signed)
Mariah [name] is a 64 year old female, [place] speaker, FU televist 3/24    FU from last televisit:  #) Sinus pressure resolved  #) Anxiety - is a lot better, thinks that sleeping is better, an she's more calm during the day. Medication certainly helps (escitalopram 10 mg)    #) BP "oscilates",  She can't recall values. She takes losartan daily, HCTZ "sometimes", bothered by frequent urination  Losartan 100 mg  HCTZ, not every day    #) Had 2 doses of COVID vaccination    #) CT lung results   Sometimes feels a little SOB, no clear pattern.   She started walking daily, 30-40 min, yest 90 min!   No cp/doe/sob   No cough or wheezing    CT:     SCREENING VISIT: follow up     COMPARISON: 02/14/2020, 01/17/2019     FINDINGS:   Diagnostic quality: Satisfactory     Lung Screening Specific:     Nodules depicted in series 3 as below:     -5 mm, solid nodule in the right upper lobe, (image # 50), .   -4 mm, perifissural solid nodule in the right middle lobe, (image # 105), .   -6 x 5 mm, solid perifissural nodule in the right middle lobe, (image # 102), .   -6 mm, solid nodule in the left lower lobe, (image # 155), .   -6 mm, solid nodule in the left lower lobe, (image # 155), .   -5 mm, subpleural solid nodule in the right upper lobe, (image # 55), .   -3 mm, solid nodule in the right middle lobe, (image # 100),   -4 mm, perifissural solid nodule in the right lower lobe, (image # 116), ..   -Multiple 3 mm pulmonary nodules in the right lung (image #100, #79, #113, #119, #141, #142, #143). Nodules were noted in the prior studies and unchanged.     Additional findings:        Lungs: There is mild centrilobular emphysema. Previously noted scattered groundglass and focal airspace opacities in the lungs have resolved. There is no residual disease.     Pleura: No pleural effusion or pneumothorax.     Airways: Patent.     Vessels: Aorta is nondilated with scattered vascular calcifications. The pulmonary artery is  nondilated.     Heart: Cardiac size not enlarged. Mild coronary artery calcifications. There is no pericardial effusion.     Mediastinum/Lymph nodes: No enlarged mediastinal or hilar lymph nodes.      Upper abdomen: In the limited noncontrast CT of the upper abdomen, visualized liver, spleen, adrenals, kidneys and pancreas are unremarkable. Status post gastric bypass surgery.     Chest wall: Unremarkable.     Bones: Mild degenerative changes of the spine.     Thyroid: Incompletely visualized thyroid.     IMPRESSION:     Multiple pulmonary nodules largest measuring 6 mm. No definite new nodules are seen.     LUNG-RADS category: 2 - Benign appearance or behavior   Modifier: None     RECOMMENDATIONS: LDCT 1 Yr - Continue annual screening with LDCT chest in 12 months based on ACR Lung-RADS Version 1.1.     Resolution of the previously noted patchy airspace and groundglass opacities throughout the lungs.       [facility] is an ACR accredited site for LDCT screening program.         Reviewed and Electronically Signed by: Georgina Snell  Quint.Jolly MD   Signed Date/Time: 04-23-2020 11:32:13        OBJECTIVE:  VS and PE deferred d/t COVID pandemic  Pt speaking in full sentences in NAD    ASSESSMENT & PLAN:  (F41.9) Anxiety  (primary encounter diagnosis)  Comment: Significant improvement compared to last visit. Sleeping better, improved motivation, exercising regularly. Continue escitalopram. Enjoy the warm weather and the outdoors!  Plan: escitalopram (LEXAPRO) 10 MG tablet            (R91.8) Abnormal CT lung screening  Comment: Results reviewed, pt reassured.   Plan: Yearly screening, HM updated    (I11.9) Benign HTN without hear failure  Comment:reports good compliance to losartan, takes [place] occasionally. Reviewed need to take both meds every day for optimal BP control. Continue home monitoring, keep log, call if BP > 140/80 on repeated occasions    The patient was ready to learn and no apparent learning or adherence barriers  were identified. I explained the diagnosis and treatment plan, and the patient expressed understanding of the content. I attempted to answer any questions regarding the diagnosis and the proposed treatment.    follow-up prn

## 2020-05-04 MED FILL — OMEPRAZOLE   20MG: 30 days supply | Qty: 30 | Fill #0

## 2020-05-04 MED FILL — FERROUS GLUC 324MG (38 MG): 30 days supply | Qty: 60 | Fill #4

## 2020-05-04 MED FILL — VITAMIN D3 1000UNIT TAB: 30 days supply | Qty: 30 | Fill #1

## 2020-06-07 ENCOUNTER — Other Ambulatory Visit (HOSPITAL_BASED_OUTPATIENT_CLINIC_OR_DEPARTMENT_OTHER): Payer: Self-pay | Admitting: Internal Medicine

## 2020-06-07 DIAGNOSIS — I119 Hypertensive heart disease without heart failure: Secondary | ICD-10-CM

## 2020-06-07 MED FILL — ESCITALOPRAM 10MG: 30 days supply | Qty: 30 | Fill #0 | Status: CP

## 2020-06-07 MED FILL — VITAMIN D3 1000 UNITS: 30 days supply | Qty: 30 | Fill #0 | Status: CP

## 2020-06-07 MED FILL — OMEPRAZOLE   20MG: 30 days supply | Qty: 30 | Fill #0 | Status: CP

## 2020-06-07 MED FILL — LOSARTAN POT 50MG: 90 days supply | Qty: 180 | Fill #0

## 2020-06-07 MED FILL — HYDROCHLOROT 25MG: 90 days supply | Qty: 90 | Fill #3

## 2020-06-07 NOTE — Telephone Encounter (Signed)
PER Pharmacy, Mariah [name] is a 64 year old female has requested a refill of        Losartan     Last Office Visit: 04/28/2020   With Eustace Pen, m   Last Physical Exam:12/27/18    There are no preventive care reminders to display for this patient.    HTN Med:    Most Recent BP Reading(s)  12/27/18 : 137/84  07/09/18 : 134/82  04/09/18 : 147/77      Documented patient preferred pharmacies:      [facility], [place], [place]  Phone: [phone] Fax: [phone]

## 2020-06-14 ENCOUNTER — Ambulatory Visit: Payer: No Typology Code available for payment source | Attending: Internal Medicine | Admitting: Internal Medicine

## 2020-06-14 DIAGNOSIS — I119 Hypertensive heart disease without heart failure: Secondary | ICD-10-CM | POA: Insufficient documentation

## 2020-06-14 DIAGNOSIS — R635 Abnormal weight gain: Secondary | ICD-10-CM | POA: Insufficient documentation

## 2020-06-14 DIAGNOSIS — F411 Generalized anxiety disorder: Secondary | ICD-10-CM | POA: Insufficient documentation

## 2020-06-14 MED ORDER — ESCITALOPRAM OXALATE 20 MG PO TABS
20.0000 mg | ORAL_TABLET | Freq: Every day | ORAL | 2 refills | Status: DC
Start: 2020-06-14 — End: 2020-10-28

## 2020-06-14 NOTE — Progress Notes (Signed)
Mariah [name] is a 64 year old female . Last televist in May    BP concerns for the past 3 wk  Checks BP at home, reports systolic 469-629, diastolic 528  Baseline is 140/80  Losartan 100 mg am  HCTZ daily, does not notice increased diuresis  If BP in the 170s she takes another 100 mg at night  She has body aches and headaches, that she relates to the bp    Most Recent Weight Reading(s)  12/27/18 : 78.9 kg (174 lb)  10/08/18 : 81.6 kg (180 lb)  11/15/17 : 80.9 kg (178 lb 4 oz)  10/25/17 : 80.7 kg (177 lb 14.4 oz)  09/06/17 : 80.3 kg (177 lb)      #)  Admits to increased wt , 25 lb over the past 4 months  Feels very tired  Admits to anxiety, emotional eating, lack of energy, lack of motivation. Usually OK during the day, eats at regular intervals,lunch, fruits. At night she has dinner, but does not feel satiety, keeps eating. Sometimes she wakes up in the middle of the night to eat. Does not feel she can control it.    Most Recent Weight Reading(s)  12/27/18 : 78.9 kg (174 lb)  10/08/18 : 81.6 kg (180 lb)  11/15/17 : 80.9 kg (178 lb 4 oz)  10/25/17 : 80.7 kg (177 lb 14.4 oz)  09/06/17 : 80.3 kg (177 lb)    OBJECTIVE:  VS and PE deferred d/t COVID pandemic  Pt speaking in full sentences in NAD    ASSESSMENT & PLAN:  (I11.9) Benign hypertensive heart disease without heart failure  (primary encounter diagnosis)  Comment: reports bp readings in the 413-244 systolic. Reports good compliance to meds. Admits to wt gain as below. Agrees to referral to pharmacotherapy  Plan: REFERRAL TO PHARMACOTHERAPY SERVICES ([facility])          (F41.1) Anxiety state  Comment: no benefit form escitalopram 10 mg. Increase to 20 mg.   Consider [place] referral, to be discussed at next visit  Plan: escitalopram (LEXAPRO) 20 MG tablet          (R63.5) Abnormal weight gain  Comment: Reports 24lb in the past 3-4 months. Admits to increased emotional eating  -- A1c was elevated in Feb. Re-check to r/o DM  -- Check tsh  Plan: HEMOGLOBIN A1C,  THYROID SCREEN TSH REFLEX FT4          -- msg sent to front end for lab only visit and FU televisit with me a few days later    The patient was ready to learn and no apparent learning or adherence barriers were identified. I explained the diagnosis and treatment plan, and the patient expressed understanding of the content. I attempted to answer any questions regarding the diagnosis and the proposed treatment.      follow-up as above

## 2020-06-15 ENCOUNTER — Telehealth (HOSPITAL_BASED_OUTPATIENT_CLINIC_OR_DEPARTMENT_OTHER): Payer: Self-pay | Admitting: Pharmacist

## 2020-06-15 MED FILL — ESCITALOPRAM 20MG: 30 days supply | Qty: 30 | Fill #0

## 2020-06-15 NOTE — Telephone Encounter (Signed)
Hypertension/Elevated BP and Outreach    Date of missed visit or date of initial referral outreach: 06/14/20    Attempt: 1st    For in person visit     Outcome:   Left message to schedule via clinic    Should pt return my call:  Please schedule with Lavonda Jumbo, rx consult 30 for  30 min.     Note: to see future outreach attempts associated with this initial referral, please see communications section of referral

## 2020-06-24 ENCOUNTER — Ambulatory Visit: Payer: No Typology Code available for payment source | Attending: Internal Medicine

## 2020-06-24 ENCOUNTER — Other Ambulatory Visit: Payer: Self-pay

## 2020-06-24 DIAGNOSIS — R635 Abnormal weight gain: Secondary | ICD-10-CM

## 2020-06-24 LAB — THYROID SCREEN TSH REFLEX FT4: THYROID SCREEN TSH REFLEX FT4: 1.87 u[IU]/mL (ref 0.358–3.740)

## 2020-06-24 NOTE — Progress Notes (Signed)
Lab drawn and collected

## 2020-06-25 LAB — HEMOGLOBIN A1C
ESTIMATED AVERAGE GLUCOSE: 114 (ref 74–160)
HEMOGLOBIN A1C: 5.6 % (ref 4.0–5.6)

## 2020-07-05 ENCOUNTER — Ambulatory Visit: Payer: No Typology Code available for payment source | Attending: Internal Medicine | Admitting: Internal Medicine

## 2020-07-05 DIAGNOSIS — I119 Hypertensive heart disease without heart failure: Secondary | ICD-10-CM | POA: Diagnosis present

## 2020-07-05 DIAGNOSIS — R918 Other nonspecific abnormal finding of lung field: Secondary | ICD-10-CM | POA: Insufficient documentation

## 2020-07-05 DIAGNOSIS — F411 Generalized anxiety disorder: Secondary | ICD-10-CM | POA: Diagnosis present

## 2020-07-05 DIAGNOSIS — R635 Abnormal weight gain: Secondary | ICD-10-CM

## 2020-07-05 DIAGNOSIS — R05 Cough: Secondary | ICD-10-CM | POA: Diagnosis present

## 2020-07-05 DIAGNOSIS — R059 Cough, unspecified: Secondary | ICD-10-CM

## 2020-07-05 NOTE — Progress Notes (Signed)
Able to reach pt, she prefers to connect via [place] is a 64 year old female, [place], for FU televisit 6/28 when we discussed BP concerns (pt referred to pharmacotherapy); anxiety (escitalopram increased to 20 mg), and abnormal wt gain (Labs ordered)     Data review:   Component      Latest Ref Rng & Units 06/24/2020   HEMOGLOBIN A1C      4.0 - 5.6 % 5.6   ESTIMATED AVERAGE GLUCOSE      74 - 160 114   THYROID SCREEN TSH REFLEX FT4      0.358 - 3.740 uIU/mL 1.870     Did I review these results with her? - CT Lung    IMPRESSION:     Multiple pulmonary nodules largest measuring 6 mm. No definite new nodules are seen.     LUNG-RADS category: 2 - Benign appearance or behavior   Modifier: None     RECOMMENDATIONS: LDCT 1 Yr - Continue annual screening with LDCT chest in 12 months based on ACR Lung-RADS Version 1.1.     Resolution of the previously noted patchy airspace and groundglass opacities throughout the lungs.      Today with c/o:    -- Has not picked up new dose of medication yet, she was in [facility] for 1 wk, and just returned. Plans to pick up today.    -- She's coughing constantly, worse at night. Also had nasal congestion. No fever. It feels like a typical URI that started on her trip to [facility]. No SOB, no wheezing, no difficulty talking. She's using Nyquil and another OTC cough suppressant, and it has been helpful    HEMOGLOBIN A1C (%)   Date Value   06/24/2020 5.6   02/11/2020 5.9 (H)   12/30/2018 5.4     -- She did not notice if during the week spent in [facility] she was less anxious    -- She's considering starting yoga, and massage and acupuncture for body aches    OBJECTIVE:  VS and PE deferred d/t COVID pandemic  Pt speaking in full sentences in NAD    ASSESSMENT & PLAN:  (R63.5) Abnormal weight gain (primary dx)  Comment: Discussed at length at last visit. Normal TSH, A1c is lower than in February. Wt gain likely a result of increased caloric intake.   Plan: Life style reviewed. Could  consider nutritionist in the future    (R91.8) Abnormal CT lung screening  Comment: results reviewed. Stable nodules, Ground glass opacities noted in February are resolved  Plan: Yearly screening    (I11.9) Benign hypertensive heart disease without heart failure  Comment: Has not monitored BP at home. Says she has not heard from pharmacotherapy  Plan: Pharmacotherapy attempted to contact her. Await for their outreach    (F41.1) Anxiety state  Comment: Doing well. Had a good time in [place]. Has not picked up increased dose of escitalopram, plans to do today  Plan: FU as needed    (R05) Cough  Comment: Reports approx 10d with cough, onset when she arrived to [facility]. COVID vaccinated. No systemic sx, no SOB, wheezing. OTC cough suppressants are effective  Plan: Possibly common URI, but keep an eye on sx, and call if not improving over the next week, if worsens, or if develops systemic sx. If this happens, she should be evaluated at the [facility]    The patient was ready to learn and no apparent learning or adherence barriers  were identified. I explained the diagnosis and treatment plan, and the patient expressed understanding of the content. I attempted to answer any questions regarding the diagnosis and the proposed treatment.    follow-up prn

## 2020-07-13 MED FILL — ESCITALOPRAM 20MG: 30 days supply | Qty: 30 | Fill #0

## 2020-07-13 MED FILL — VITAMIN D3 1000 UNITS: 30 days supply | Qty: 30 | Fill #1

## 2020-07-13 MED FILL — OMEPRAZOLE   20MG: 30 days supply | Qty: 30 | Fill #1

## 2020-07-19 ENCOUNTER — Ambulatory Visit (HOSPITAL_BASED_OUTPATIENT_CLINIC_OR_DEPARTMENT_OTHER): Payer: Medicaid Other | Admitting: Ophthalmology

## 2020-07-26 ENCOUNTER — Emergency Department
Admission: EM | Admit: 2020-07-26 | Discharge: 2020-07-26 | Disposition: A | Discharge status: Home / Self Care | Payer: No Typology Code available for payment source | Source: Intra-hospital | Attending: Emergency Medicine | Admitting: Emergency Medicine

## 2020-07-26 ENCOUNTER — Encounter (HOSPITAL_BASED_OUTPATIENT_CLINIC_OR_DEPARTMENT_OTHER): Payer: Self-pay

## 2020-07-26 ENCOUNTER — Emergency Department (HOSPITAL_BASED_OUTPATIENT_CLINIC_OR_DEPARTMENT_OTHER): Payer: No Typology Code available for payment source

## 2020-07-26 ENCOUNTER — Other Ambulatory Visit: Payer: Self-pay

## 2020-07-26 DIAGNOSIS — M545 Low back pain, unspecified: Secondary | ICD-10-CM

## 2020-07-26 DIAGNOSIS — R109 Unspecified abdominal pain: Secondary | ICD-10-CM | POA: Diagnosis not present

## 2020-07-26 LAB — BASIC METABOLIC PANEL
ANION GAP: 12 mmol/L (ref 5–15)
BUN (UREA NITROGEN): 20 mg/dL — ABNORMAL HIGH (ref 7–18)
CALCIUM: 9.9 mg/dL (ref 8.5–10.1)
CARBON DIOXIDE: 28 mmol/L (ref 21–32)
CHLORIDE: 101 mmol/L (ref 98–107)
CREATININE: 0.8 mg/dL (ref 0.4–1.2)
ESTIMATED GLOMERULAR FILT RATE: >60 mL/min (ref >60)
Glucose Random: 77 mg/dL (ref 74–160)
POTASSIUM: 4.9 mmol/L (ref 3.5–5.1)
SODIUM: 141 mmol/L (ref 136–145)

## 2020-07-26 LAB — URINALYSIS RFLX TO URINE CULT
BILIRUBIN, URINE: NEGATIVE
GLUCOSE, URINE: NEGATIVE MG/DL
LEUKOCYTE ESTERASE: NEGATIVE
NITRITE, URINE: NEGATIVE
OCCULT BLOOD, URINE: NEGATIVE
PH URINE: 5.5 (ref 5.0–8.0)
PROTEIN, URINE: NEGATIVE MG/DL
SPECIFIC GRAVITY URINE: 1.02 (ref 1.003–1.035)

## 2020-07-26 LAB — CBC WITH PLATELET
ABSOLUTE NRBC COUNT: 0 10*3/uL (ref 0.0–0.0)
HEMATOCRIT: 41.3 % (ref 34.1–44.9)
HEMOGLOBIN: 13.1 g/dL (ref 11.2–15.7)
MEAN CORP HGB CONC: 31.7 g/dL (ref 31.0–37.0)
MEAN CORPUSCULAR HGB: 27.5 pg (ref 26.0–34.0)
MEAN CORPUSCULAR VOL: 86.6 fl (ref 80.0–100.0)
MEAN PLATELET VOLUME: 10.4 fL (ref 8.7–12.5)
NRBC %: 0 % (ref 0.0–0.0)
PLATELET COUNT: 325 10*3/uL (ref 150–400)
RBC DISTRIBUTION WIDTH STD DEV: 43.8 fL (ref 35.1–46.3)
RED BLOOD CELL COUNT: 4.77 M/uL (ref 3.90–5.20)
WHITE BLOOD CELL COUNT: 5.6 10*3/uL (ref 4.0–11.0)

## 2020-07-26 MED ORDER — IBUPROFEN 600 MG PO TABS
600.00 mg | ORAL_TABLET | Freq: Four times a day (QID) | ORAL | 0 refills | Status: AC | PRN
Start: 2020-07-26 — End: 2020-07-31

## 2020-07-26 MED ORDER — ONDANSETRON HCL 4 MG/2ML IJ SOLN
4.00 mg | Freq: Once | INTRAMUSCULAR | Status: AC
Start: 2020-07-26 — End: 2020-07-26
  Administered 2020-07-26: 4 mg via INTRAVENOUS
  Filled 2020-07-26: qty 2

## 2020-07-26 MED ORDER — SODIUM CHLORIDE 0.9 % IV BOLUS
1000.0000 mL | Freq: Once | INTRAVENOUS | Status: AC
  Administered 2020-07-26: 1000 mL via INTRAVENOUS

## 2020-07-26 MED ORDER — KETOROLAC TROMETHAMINE 15 MG/ML IJ SOLN
15.00 mg | Freq: Once | INTRAMUSCULAR | Status: AC
Start: 2020-07-26 — End: 2020-07-26
  Administered 2020-07-26: 15 mg via INTRAVENOUS
  Filled 2020-07-26: qty 1

## 2020-07-26 NOTE — Narrator Note (Signed)
Patient Disposition  Patient education for diagnosis, medications, activity, diet and follow-up.  Patient left ED 4:49 PM.  Patient rep received written instructions.    Interpreter to provide instructions: Yes; Interpreter ID: [place] Interpreter    Patient belongings with patient: YES    Have all existing LDAs been addressed? Yes    Have all IV infusions been stopped? Yes    Destination: Discharged to home    Patient provided verbal and written discharge instructions in primary language of care. All questions answered. Patient understands reasons to return to Emergency Department and need to follow up with PCP and/or specialist as directed. Ambulated from Emergency Department with steady gait and all belongings.

## 2020-07-26 NOTE — ED Provider Notes (Signed)
eMERGENCY dEPARTMENT Physician Assistant NOTE    The ED nursing record was reviewed.   The prior medical records as available electronically through Epic were reviewed.    A [place] interpreter was used.    This patient was seen with Emergency Department attending physician Dr. Adelina Mings.     CHIEF COMPLAINT    Patient presents with:  Back Pain      HPI    Mariah [name] is a 64 year old female presenting for evaluation of left back pain and nausea.  Reports history of kidney stones, passed on her own, did not require surgery.  No changes in urinary frequency urgency, dysuria, gross hematuria.  No back or flank pain.  No vomiting.  No fevers.  No vaginal symptoms.  No changes in stool.  No numbness, tingling, or weakness of lower extremities.  No incontinence.  No fevers, drug use, prolonged steroid use, immunocompromising illnesses.      PAST MEDICAL HISTORY    Past Medical History:  03/27/2006: ABN FIND-STOOL CONTENTS-OCC BLOOD      Comment:  s/p colonoscopy and EGD 7/07  06/04/2006: ABSENCE OF MENSTRUATION - s/p TAH BSO  08/05/2004: ANXIETY DEPRESSION      Comment:  TSH wnl 8/02; trial of fluoxetine 8/05  08/05/2004: BACK PAIN LOW      Comment:  LBP musculoskeletal most likely 1/04  04/23/2006: Cough      Comment:  s/p PFTs September 2007 --  some obstruction at mid to                low lung volumes not correctable by bronchodilator CT                scan showed stable pulmonary nodules over several years                11/07 - cough gone ** note combined with dx of "nodules                on CT scan". Will file this note to hx.     08/05/2004: depressive disorder      Comment:  TSH wnl 8/02; trial of fluoxetine x 6 wks didn't really                help 8/05  trial of citalopram per Dr. Patrice Paradise. later                resolved. 04/15/2014 - Reassessed, pt reports feeling                well, with no sx of depression. Just reports being tired,               which she attributes to current life style. Will resolve                 from problem list.    03/27/2006: Diarrhea      Comment:  7/07 The colonoscopy was visually normal and biopsies                taken to rule out microscopic colitis were negative.  A                biopsy of the descending duodenum to rule out celiac                sprue because of the diarrhea was negative.    12/19/2012: Gastritis      Comment:  Combined with entry "esophageal reflux  10/22/2006: Hyperparathyroidism      Comment:  osteoporosis found - pt referred to surgery for the                hyperparathyroidism- s/p parathyroidectomy feb 2008  06/18/2012: Inconclusive mammogram      Comment:  June 2013 - BIRADS 3. Probable benign mammogram.                Recommend 6 month  follow-up mammogram (Dec 2013).                 Dec/13 - Stable left breast nodule with no evidence of                malignancy. BIRADS 2 benign recommend annual screening                mammogram in 6 months  (June 2014) June/14 - mammogram                request given to pt, she will call to schedule:  Jan/15 -               normal mammogram, yearly screening. Resolve from problem                list.        03/13/2006: Insomnia, unspecified      Comment:  06/25/2012 - intermittent, usually resolved by taking                Tylenol PM 06/09/2014 - good response to trazodone, takes                one to 4x wk, prn 08/24/2016 - reports no problems sleeping               - file to hx, and resolve from problem list.   03/27/2006: nodules on chest CT 1/06 and 3/07      Comment:  s/p PFTs September 2007 --  some obstruction at mid to                low lung volumes not correctable by bronchodilator CT                scan showed stable pulmonary nodules over several years                11/07 - cough gone  04/29/12 - saw pneumologist, Dr Jordan Hawks,               "Will repeat chest CT with high-resolution scans at the 2               month mark (already ordered). If abnormal, would do PFT's               and further rheum w/u, consider lung bx .  Pt to call if                any r  08/05/2004: OBESITY NOS      Comment:  has tried meds from [place]  08/05/2004: PERS HX OF PAST NONCOMPLIANCE      Comment:  poor compliance to med regimen  08/05/2004: PERS HX TOBACCO USE      Comment:  quit 10/02; 1/2 PPD since 64 YO  08/05/2004: Screening for hypertension      Comment:  occ elevated BP started on anti-HTN in [place]; chem 7,                EKG, HCT u/a  wnl 4/03; d/c'ed HCTZ 8/05 when we learned                from daughter she has not been taking it  05/19/2008: Vitamin D deficiency      Comment:  Supplement per dr Jake Samples.    PROBLEM LIST  Patient Active Problem List:     History of bariatric surgery     LATERAL FOOT PAIN     Benign hypertensive heart disease without heart failure     Alkaline phosphatase elevation     Calculus of kidney     Gastritis     Allergic rhinitis due to other allergen     Osteoporosis     Murmur heart     Family history of throat cancer     Vitamin D deficiency     Subclavian artery stenosis, left (HCC)     Schatzki's ring     Duodenal ulcer, unspecified as acute or chronic, without hemorrhage, perforation, or obstruction     Mild depression ([place])     Glaucoma suspect of both eyes     Hyperopia with presbyopia of both eyes     Abnormal CT lung screening     Elevated LFTs     COVID-19 virus infection      SURGICAL HISTORY    Past Surgical History:  04/2016: BARIATRIC SURGERY      Comment:  done at [facility]  2006: PARATHYROIDECTOMY      Comment:  partial,   5/00: SALPINGO-OOPHORECTOMY COMPL/PRTL UNI/BI SPX      Comment:  Salpingo-Oophorectomy, bilateral  No date: TONSILLECTOMY ONE-HALF <AGE 23  5/00: TOTAL ABDOMINAL HYSTERECT W/WO RMVL TUBE OVARY      Comment:  Hysterectomy, Total Abdominal secondary to fibroids    CURRENT MEDICATIONS    No current facility-administered medications for this encounter.    Current Outpatient Medications:     losartan (COZAAR) 50 MG tablet, Take 2 tablets by mouth daily, Disp: 180 tablet, Rfl: 3     hydrochlorothiazide (HYDRODIURIL) 25 MG tablet, Take 1 tablet by mouth daily, Disp: 90 tablet, Rfl: 3    escitalopram (LEXAPRO) 20 MG tablet, Take 1 tablet by mouth daily, Disp: 30 tablet, Rfl: 2    omeprazole (PRILOSEC) 20 MG capsule, TAKE 1 CAPSULE BY MOUTH DAILY, Disp: 30 capsule, Rfl: 10    cholecalciferol (VITAMIN D3) 1000 UNIT tablet, Take 1 tablet by mouth daily, Disp: 30 tablet, Rfl: 11    ferrous gluconate (FERGON) 324 MG tablet, TAKE 1 TABLET BY MOUTH 2 (TWO) TIMES DAILY, Disp: 60 tablet, Rfl: 11    ALLERGIES    Review of Patient's Allergies indicates:   Heparin                     Comment:local rash seen during hospitalization Feb 2008   Lisinopril              Cough    FAMILY HISTORY    Review of patient's family history indicates:  Problem: No Known Problems      Relation: Father          Age of Onset: (Not Specified)  Problem: Cancer - Lung      Relation: Father          Age of Onset: (Not Specified)          Comment: died from lung CA- throat cancer per pt. Also on dyalisis  for 10 years  Problem: Hypertension      Relation: Mother          Age of Onset: (Not Specified)  Problem: Lipids      Relation: Mother          Age of Onset: (Not Specified)          Comment: hyperlipidemia  Problem: Heart      Relation: Maternal Uncle          Age of Onset: (Not Specified)          Comment: died 58 from MI  Problem: Diabetes      Relation: Maternal Aunt          Age of Onset: (Not Specified)  Problem: Diabetes      Relation: Mother          Age of Onset: (Not Specified)          Comment: also morbidly weight  Problem: Cancer - Other      Relation: Brother          Age of Onset: (Not Specified)          Comment: liver cancer, Sept 2013  Problem: Non-contributory      Relation: Brother          Age of Onset: (Not Specified)          Comment: no first deg rel. w/ breast ca  Problem: No Known Problems      Relation: Sister          Age of Onset: (Not Specified)  Problem: No Known Problems       Relation: Daughter          Age of Onset: (Not Specified)  Problem: No Known Problems      Relation: Maternal Grandmother          Age of Onset: (Not Specified)  Problem: No Known Problems      Relation: Maternal Grandfather          Age of Onset: (Not Specified)  Problem: No Known Problems      Relation: Paternal Grandmother          Age of Onset: (Not Specified)  Problem: No Known Problems      Relation: Paternal Grandfather          Age of Onset: (Not Specified)      SOCIAL HISTORY    Social History     Socioeconomic History    Marital status: Married     Spouse name: Not on file    Number of children: Not on file    Years of education: Not on file    Highest education level: Not on file   Occupational History    Not on file   Tobacco Use    Smoking status: Former Smoker     Packs/day: 0.50     Years: 40.00     Pack years: 20.00     Types: Cigarettes     Quit date: 03/31/2012     Years since quitting: 8.3    Smokeless tobacco: Former Systems developer    Tobacco comment: 3-4 cigarretes a day. Quit about 1 month ago   Substance and Sexual Activity    Alcohol use: Yes     Comment: very occasional    Drug use: No    Sexual activity: Yes     Partners: Male     Comment: histerectomy   Other Topics Concern    Not on  file   Social History Narrative    work: Engineer, building services; owned bakery in [place]    Lives w/ daughter 15 YO ([place]) & son-in-law Lowella Dandy)    Divorced; currently not in a relationship        05/27/2015    From [place]. Works as Electrical engineer, has own business.     Married, Djaniro.     No regular exercise    Diet: varies. Eats at irregular intervals. Tries to watch diet, but likes to eat    Stressors: work    Pleasures: family, grand children        08/24/2016    Same living and work situation. No regular exercise. Diet improved after bariatric surgery, and pt is very happy with results.         01/09/2019    No changes in social hx   Social Determinants of Health  Financial Resource Strain:      Difficulty of Paying Living Expenses:   Food Insecurity:     Worried About Charity fundraiser in the Last Year:     Arboriculturist in the Last Year:   Transportation Needs:     Film/video editor (Medical):     Lack of Transportation (Non-Medical):   Physical Activity:     Days of Exercise per Week:     Minutes of Exercise per Session:   Stress:     Feeling of Stress :   Social Connections:     Frequency of Communication with Friends and Family:     Frequency of Social Gatherings with Friends and Family:     Attends Religious Services:     Active Member of Clubs or Organizations:     Attends Music therapist:     Marital Status:   Intimate Partner Violence:     Fear of Current or Ex-Partner:     Emotionally Abused:     Physically Abused:     Sexually Abused:     REVIEW OF SYSTEMS     The pertinent positives are reviewed in the HPI above. All other systems were reviewed and are negative.    PHYSICAL EXAM      Vital Signs: BP 121/91    Pulse 81    Temp 98.3 F    Resp 16    Wt 78.9 kg (174 lb)    SpO2 97%    BMI 30.34 kg/m      Constitutional: Well-developed, Well-nourished, Non-toxic appearance. Speaking full sentences.    Distress: NAD.   HEAD: NCAT, Without signs of trauma.    EYES: Pupils are equal.  ENT: Normal.  CV: RRR.  PULMONARY:  CTAB, No WRR.  ABDOMINAL: Soft, NTND.  GENITOURINARY: No CVA tenderness.   MUSCULOSKELETAL : Moving all 4 extremities. Ambulatory w/ a steady gait.  The spine is straight and nontender.  No saddle paresthesia.  No paraspinal muscle tenderness.  SKIN: Warm and dry, no rash . The skin color and turgor are normal.   NEUROLOGIC: Normal mental status.   PSYCHIATRIC: Normal affect      RESULTS  Results for orders placed or performed during the hospital encounter of 07/26/20 (from the past 24 hour(s))   CBC with Platelet    Collection Time: 07/26/20  2:27 PM   Result Value    WHITE BLOOD CELL COUNT 5.6    RED BLOOD CELL COUNT 4.77     HEMOGLOBIN 13.1    HEMATOCRIT 41.3    MEAN  CORPUSCULAR VOL 86.6    MEAN CORPUSCULAR HGB 27.5    MEAN CORP HGB CONC 31.7    RBC DISTRIBUTION WIDTH STD DEV 43.8    PLATELET COUNT 325    MEAN PLATELET VOLUME 10.4    NRBC % 0.0    ABSOLUTE NRBC COUNT 0.0   Basic Metabolic Panel    Collection Time: 07/26/20  2:27 PM   Result Value    SODIUM 141    POTASSIUM 4.9    CHLORIDE 101    CARBON DIOXIDE 28    ANION GAP 12    CALCIUM 9.9    Glucose Random 77    BUN (UREA NITROGEN) 20 (H)    CREATININE 0.8    ESTIMATED GLOMERULAR FILT RATE > 60   Urinalysis Rflx to Urine Cult    Collection Time: 07/26/20  2:27 PM   Result Value    COLOR YELLOW    CLARITY CLEAR    GLUCOSE, URINE NEGATIVE    BILIRUBIN, URINE NEGATIVE    KETONE, URINE TRACE (A)    SPECIFIC GRAVITY URINE 1.020    OCCULT BLOOD, URINE NEGATIVE    PH URINE 5.5    PROTEIN, URINE NEGATIVE    NITRITE, URINE NEGATIVE    LEUKOCYTE ESTERASE NEGATIVE    MICROSCOPIC NOT INDICATED    Narrative    UCV&Urine, Clean Void        RADIOLOGY  Abdominal/pelvic CT -  IMPRESSION:   No acute finding       MEDICATIONS ADMINISTERED ON THIS VISIT  Orders Placed This Encounter      ketorolac (TORADOL) injection 15 mg      ondansetron (ZOFRAN) injection 4 mg      sodium chloride 0.9 % IV bolus 1,000 mL      ED COURSE & MEDICAL DECISION MAKING      I reviewed the patient's past medical history/problem list, past surgical history, medication list, social history and allergies.    Arrival: Pt arrived in stable condition and required no immediate interventions.    ED Decision Making & Course: Pt is a 64 year old female presenting for evaluation of left back pain and nausea.  Additional history and exam findings as documented above.  Given nausea and history of stone, will obtain labs, urine, and CT.  Labs reveal mildly elevated BUN 20, normal creatinine.  UA not consistent with infection.  CT does not show stone.  Likely muscular, patient does report some pain worse with movement.  Treated with  Toradol, Zofran, IV fluids with good relief.  Will take NSAIDs and Tylenol at home.  Instructed to follow-up with primary care.  Reviewed reasons to return to the ED in detail.  Pt remained hemodynamically stable during their stay in the emergency department.     Disposition/Discharge: Home. Stable and improved.     Diagnosis: Acute left-sided low back pain without sciatica.    Aurea Graff, [place]  Emergency Department

## 2020-07-26 NOTE — ED Triage Note (Signed)
Patient self presents ambulatory to ED Complaining of 10/10 L side back pain since yesterday. Patient also complains of nausea.  Denies urinary symptoms or blood in urine.  Reports hx of kidney stones.  Skin warm, dry, appropriate for ethnicity.  In no apparent distress, speaking in full sentences.

## 2020-08-16 MED FILL — LOSARTAN POT 50MG: 90 days supply | Qty: 180 | Fill #0

## 2020-08-16 MED FILL — ESCITALOPRAM 20MG: 30 days supply | Qty: 30 | Fill #1

## 2020-08-16 MED FILL — OMEPRAZOLE   20MG: 30 days supply | Qty: 30 | Fill #2

## 2020-08-16 MED FILL — *VITAMIN D3 1000UNIT: 30 days supply | Qty: 30 | Fill #2

## 2020-08-24 ENCOUNTER — Ambulatory Visit: Payer: No Typology Code available for payment source | Attending: Internal Medicine

## 2020-08-24 DIAGNOSIS — I119 Hypertensive heart disease without heart failure: Secondary | ICD-10-CM | POA: Insufficient documentation

## 2020-08-24 NOTE — Progress Notes (Signed)
Hypertension Management Initial Visit    Subjective:  Interpreter used: Yes. [place] [phone])    Mariah [name] is a 64 year old female who presents via telephone for hypertension initial visit.    Current medications for hypertension include:  1. Losartan 50 mg tablets - 2 tablets daily (TDD: 100 mg)  2. Hydrochlorothiazide 25 mg daily    Previous HTN medication trials:  - Irbesartan  - Lisinopril (caused cough)     Patient reported adherence: Patient reports taking medication every day. States only "once in a great while" missing medication.    Side effects from current regimen: None reported.    Self Monitoring of BP:  The patient is using a digital machine: Yes, but machine has not been correlated in clinic yet. Reports BP readings of "13/90" and "14/80" (likely meaning 130/90 and 140/80).    Machines to be validated in clinic annually. Last validated in clinic on: Due now    Signs/symptoms of hypotension: None reported     Signs/symptoms of hypertension: None reported    SH: (smoking/etoh):   - Denies alcohol.  - Denies tobacco.    Diet:  - Caffeine: 1 cup of coffee in the morning. Very rarely will have tea or soda.  - Sodium: Adds salt to all meals.    Physical activity: Walking up to 60 minutes 7 days/week.     Objective:     Most Recent BP Reading(s)  07/26/20 : 121/91  12/27/18 : 137/84  07/09/18 : 134/82  04/09/18 : 147/77  11/15/17 : 156/85    The 10-year ASCVD risk score Mikey Bussing DC Jr., et al., 2013) is: 3.9%    Values used to calculate the score:      Age: 25 years      Sex: Female      Is Non-Hispanic African American: No      Diabetic: No      Tobacco smoker: No      Systolic Blood Pressure: 768 mmHg      Is BP treated: No      HDL Cholesterol: 56 mg/dL      Total Cholesterol: 190 mg/dL    ASSESSMENT/RECOMMENDATIONS    1. Patient's BP cuff has not been calibrated in clinic yet. Patient in need of in-person visit for SMBP machine correlation. In the meantime, advised patient to continue testing BP  daily and record in log for follow-up.    2.   Blood Pressure:   a. Last BP was above goal (<130/80 per ACC/AHA guidelines).  b. Assessment: Unable to assess due to nature of visit.   c. Intervention:   i. Medication:  1. Continue losartan 50 mg tablets -  2 tablets daily (TDD: 100 mg)  2. Continue hydrochlorothiazide 25 mg daily.  3. Potential next steps:  a. Switching hydrochlorothiazide to chlorthalidone.   b. Adding CCB.   c. Limiting pill burden. Switch losartan tablet from 50 mg to 100 mg tablet. If staying on both losartan and hydrochlorothiazide consider combination tablet of 100/25 mg daily.  ii. Diet/Exercise:    Discussed impact of salt on blood pressure. Advised patient to cut back on adding salt to meals. Discussed trying other spices in place of salt (e.g. garlic, pepper)     3.  Medication Reconciliation  d. Medication reconciliation was completed today verbally.  e. Refills submitted: None. Patient confirms picking up all medication refills last week.    Conditions addressed during this visit: Hypertension/Elevated BP  Follow-up scheduled with clinical  pharmacist. Patient prefers [facility]    Encouraged patient to keep scheduled pharmacotherapy visits and to call site pharmacist with questions.

## 2020-09-21 ENCOUNTER — Emergency Department
Admission: EM | Admit: 2020-09-21 | Discharge: 2020-09-21 | Disposition: A | Discharge status: Home / Self Care | Payer: No Typology Code available for payment source | Attending: Physician Assistant | Admitting: Physician Assistant

## 2020-09-21 ENCOUNTER — Institutional Professional Consult (permissible substitution) (HOSPITAL_BASED_OUTPATIENT_CLINIC_OR_DEPARTMENT_OTHER): Payer: Self-pay

## 2020-09-21 DIAGNOSIS — R21 Rash and other nonspecific skin eruption: Secondary | ICD-10-CM | POA: Diagnosis present

## 2020-09-21 MED ORDER — VALACYCLOVIR HCL 500 MG PO TABS
1000.00 mg | ORAL_TABLET | Freq: Once | ORAL | Status: AC
Start: 2020-09-21 — End: 2020-09-21
  Administered 2020-09-21: 1000 mg via ORAL
  Filled 2020-09-21: qty 2

## 2020-09-21 MED ORDER — VALACYCLOVIR HCL 500 MG PO TABS
1000.00 mg | ORAL_TABLET | Freq: Three times a day (TID) | ORAL | 0 refills | Status: AC
Start: 2020-09-21 — End: 2020-09-28

## 2020-09-21 NOTE — ED Triage Note (Signed)
Pt reports neck pain and rash to back of neck and scalp x3 days. Left side of neck appears swollen. Pt reports neck being itchy and painful. Reports taking tylenol for pain today.

## 2020-09-21 NOTE — Narrator Note (Signed)
Pt education on shingles provided. Pt aware to stay away from pregnant women and infants. Pt verbalizes understanding it is very contagious, especially in the blister/weeping stage. Pt verbalizes understanding.

## 2020-09-21 NOTE — ED Provider Notes (Signed)
ED nursing record was reviewed. Prior records as available electronically through the Epic record were reviewed.    Patient is [place] speaking and interview, exam, and final disposition were conducted via an official hospital interpreter.    HPI:    This 64 year old female presents to the Emergency Department with chief complaint of rash to the left side of the neck and back of the neck x3 days.  Patient states she is having a painful and slightly itchy rash to the left side of the neck for the past 3 days.  Reports chills without documented fever.  Denies any ear pain, eye pain, or vision changes.  States the rash is limited to the left side of the neck and scalp.  No other bodily rash.  No shortness of breath, chest pain, nausea, vomiting.  Denies any new food, soaps, shampoo, lotions, detergents, or medications.  No history of similar.    ROS: Pertinent positives were reviewed as per the HPI above. All other systems were reviewed and are negative.    Past Medical History/Problem list:  Past Medical History:  03/27/2006: ABN FIND-STOOL CONTENTS-OCC BLOOD      Comment:  s/p colonoscopy and EGD 7/07  06/04/2006: ABSENCE OF MENSTRUATION - s/p TAH BSO  08/05/2004: ANXIETY DEPRESSION      Comment:  TSH wnl 8/02; trial of fluoxetine 8/05  08/05/2004: BACK PAIN LOW      Comment:  LBP musculoskeletal most likely 1/04  04/23/2006: Cough      Comment:  s/p PFTs September 2007 --  some obstruction at mid to                low lung volumes not correctable by bronchodilator CT                scan showed stable pulmonary nodules over several years                11/07 - cough gone ** note combined with dx of "nodules                on CT scan". Will file this note to hx.     08/05/2004: depressive disorder      Comment:  TSH wnl 8/02; trial of fluoxetine x 6 wks didn't really                help 8/05  trial of citalopram per Dr. Patrice Paradise. later                resolved. 04/15/2014 - Reassessed, pt reports feeling                well,  with no sx of depression. Just reports being tired,               which she attributes to current life style. Will resolve                from problem list.    03/27/2006: Diarrhea      Comment:  7/07 The colonoscopy was visually normal and biopsies                taken to rule out microscopic colitis were negative.  A                biopsy of the descending duodenum to rule out celiac                sprue because of the diarrhea was negative.  12/19/2012: Gastritis      Comment:  Combined with entry "esophageal reflux    10/22/2006: Hyperparathyroidism      Comment:  osteoporosis found - pt referred to surgery for the                hyperparathyroidism- s/p parathyroidectomy feb 2008  06/18/2012: Inconclusive mammogram      Comment:  June 2013 - BIRADS 3. Probable benign mammogram.                Recommend 6 month  follow-up mammogram (Dec 2013).                 Dec/13 - Stable left breast nodule with no evidence of                malignancy. BIRADS 2 benign recommend annual screening                mammogram in 6 months  (June 2014) June/14 - mammogram                request given to pt, she will call to schedule:  Jan/15 -               normal mammogram, yearly screening. Resolve from problem                list.        03/13/2006: Insomnia, unspecified      Comment:  06/25/2012 - intermittent, usually resolved by taking                Tylenol PM 06/09/2014 - good response to trazodone, takes                one to 4x wk, prn 08/24/2016 - reports no problems sleeping               - file to hx, and resolve from problem list.   03/27/2006: nodules on chest CT 1/06 and 3/07      Comment:  s/p PFTs September 2007 --  some obstruction at mid to                low lung volumes not correctable by bronchodilator CT                scan showed stable pulmonary nodules over several years                11/07 - cough gone  04/29/12 - saw pneumologist, Dr Jordan Hawks,               "Will repeat chest CT with high-resolution scans at the 2                month mark (already ordered). If abnormal, would do PFT's               and further rheum w/u, consider lung bx . Pt to call if                any r  08/05/2004: OBESITY NOS      Comment:  has tried meds from [place]  08/05/2004: PERS HX OF PAST NONCOMPLIANCE      Comment:  poor compliance to med regimen  08/05/2004: PERS HX TOBACCO USE      Comment:  quit 10/02; 1/2 PPD since 64 YO  08/05/2004: Screening for hypertension      Comment:  occ elevated BP started on anti-HTN in [place]; chem 7,  EKG, HCT u/a wnl 4/03; d/c'ed HCTZ 8/05 when we learned                from daughter she has not been taking it  05/19/2008: Vitamin D deficiency      Comment:  Supplement per dr Jake Samples.  Patient Active Problem List:     History of bariatric surgery     LATERAL FOOT PAIN     Benign hypertensive heart disease without heart failure     Alkaline phosphatase elevation     Calculus of kidney     Gastritis     Allergic rhinitis due to other allergen     Osteoporosis     Murmur heart     Family history of throat cancer     Vitamin D deficiency     Subclavian artery stenosis, left (HCC)     Schatzki's ring     Duodenal ulcer, unspecified as acute or chronic, without hemorrhage, perforation, or obstruction     Mild depression ([place])     Glaucoma suspect of both eyes     Hyperopia with presbyopia of both eyes     Abnormal CT lung screening     Elevated LFTs     COVID-19 virus infection        Past Surgical History: Past Surgical History:  04/2016: BARIATRIC SURGERY      Comment:  done at [facility]  2006: PARATHYROIDECTOMY      Comment:  partial,   5/00: SALPINGO-OOPHORECTOMY COMPL/PRTL UNI/BI SPX      Comment:  Salpingo-Oophorectomy, bilateral  No date: TONSILLECTOMY ONE-HALF <AGE 69  5/00: TOTAL ABDOMINAL HYSTERECT W/WO RMVL TUBE OVARY      Comment:  Hysterectomy, Total Abdominal secondary to fibroids      Medications: No current facility-administered medications on file prior to encounter.  escitalopram  (LEXAPRO) 20 MG tablet, Take 1 tablet by mouth daily, Disp: 30 tablet, Rfl: 2  losartan (COZAAR) 50 MG tablet, Take 2 tablets by mouth daily, Disp: 180 tablet, Rfl: 3  omeprazole (PRILOSEC) 20 MG capsule, TAKE 1 CAPSULE BY MOUTH DAILY, Disp: 30 capsule, Rfl: 10  cholecalciferol (VITAMIN D3) 1000 UNIT tablet, Take 1 tablet by mouth daily, Disp: 30 tablet, Rfl: 11  hydrochlorothiazide (HYDRODIURIL) 25 MG tablet, Take 1 tablet by mouth daily, Disp: 90 tablet, Rfl: 3          Social History:   Social History     Socioeconomic History    Marital status: Married     Spouse name: Not on file    Number of children: Not on file    Years of education: Not on file    Highest education level: Not on file   Occupational History    Not on file   Tobacco Use    Smoking status: Former Smoker     Packs/day: 0.50     Years: 40.00     Pack years: 20.00     Types: Cigarettes     Quit date: 03/31/2012     Years since quitting: 8.4    Smokeless tobacco: Former Systems developer    Tobacco comment: 3-4 cigarretes a day. Quit about 1 month ago   Substance and Sexual Activity    Alcohol use: Yes     Comment: very occasional    Drug use: No    Sexual activity: Yes     Partners: Male     Comment: histerectomy   Other Topics Concern    Not on  file   Social History Narrative    work: Engineer, building services; owned bakery in [place]    Lives w/ daughter 61 YO ([place]) & son-in-law Lowella Dandy)    Divorced; currently not in a relationship        05/27/2015    From [place]. Works as Electrical engineer, has own business.     Married, Djaniro.     No regular exercise    Diet: varies. Eats at irregular intervals. Tries to watch diet, but likes to eat    Stressors: work    Pleasures: family, grand children        08/24/2016    Same living and work situation. No regular exercise. Diet improved after bariatric surgery, and pt is very happy with results.         01/09/2019    No changes in social hx   Social Determinants of Health  Financial Resource Strain:      Difficulty of Paying Living Expenses: Not on file  Food Insecurity:     Worried About Charity fundraiser in the Last Year: Not on file    [employer] of Food in the Last Year: Not on file  Transportation Needs:     Lack of Transportation (Medical): Not on file    Lack of Transportation (Non-Medical): Not on file  Physical Activity:     Days of Exercise per Week: Not on file    Minutes of Exercise per Session: Not on file  Stress:     Feeling of Stress : Not on file  Social Connections:     Frequency of Communication with Friends and Family: Not on file    Frequency of Social Gatherings with Friends and Family: Not on file    Attends Religious Services: Not on file    Active Member of Clubs or Organizations: Not on file    Attends Archivist Meetings: Not on file    Marital Status: Not on file  Intimate Partner Violence:     Fear of Current or Ex-Partner: Not on file    Emotionally Abused: Not on file    Physically Abused: Not on file    Sexually Abused: Not on file        Allergies:  Review of Patient's Allergies indicates:   Heparin                     Comment:local rash seen during hospitalization Feb 2008   Lisinopril              Cough      Physical Exam:  BP 167/86    Pulse 57    Temp 98.5 F    Resp 18    SpO2 99%     GENERAL: Well appearing, No acute distress, non-toxic.   SKIN:  Pink, warm, and dry.  Pink papules and plaques to the left side of the neck which extends to the midline of the posterior neck.  No tenderness.  No other lesions.  HEAD:  Normocephalic, atraumatic. Sclerae are anicteric and non-injected.. Oropharynx is clear with moist mucous membranes.   Bilateral TMs clear.   NECK:  No C-spine tenderness, crepitus, step-off. Supple with full range of motion.   LUNGS:  Clear to auscultation bilaterally. No wheezes, rales, rhonchi.   HEART:  RRR.  No appreciated murmurs.   EXTREMITIES:  No obvious deformities. Marland Kitchen   NEUROLOGIC:  Alert and oriented x4; moves all  extremities well; speaking in clear fluent  sentences.   PSYCHIATRIC:  Appropriate for age, time of day, and situation      RESULTS  No results found for this visit on 09/21/20 (from the past 24 hour(s)).       MEDICATIONS ADMINISTERED ON THIS VISIT  Orders Placed This Encounter      valACYclovir (VALTREX) tablet 1,000 mg          Order Specific Question: Specify indication:          Answer: Infection - organism unknown          Order Specific Question: Indicate type of infection:          Answer: Other          Order Specific Question: Type of infection:          Answer: shingles          Order Specific Question: Indicate type of therapy:          Answer: New antimicrobial therapy      valACYclovir (VALTREX) 500 MG tablet          Sig: Take 2 tablets by mouth 3 (three) times daily  for 7 days          Dispense:  42 tablet          Refill:  0         ED Course and Medical Decision-making:  This 64 year old female patient presents for evaluation of a painful rash to the back and left side of the neck x3 days.  On exam she is in no acute distress.  Vitals noted.    Rash is concerning for shingles, although she does not have any classic vesicular lesions at this time.  No involvement of the ear or face.  Not consistent with allergic reaction, SJS, erythema multiforme.  Will treat with course of Valtrex, first dose given today.  Recommend acetaminophen as needed for pain at home.  Educated on transmission and advised to avoid immunocompromised groups including pregnant women, elderly, and young children.  She is given strict return precautions.    Patient was instructed to follow-up with PCP in 7days for re-evaluation.  Signs and symptoms for which to return to the Emergency Department were discussed with the patient in detail, and they understand and are in agreement with their care plan at this time.    The patient remained hemodynamically stable throughout entire visit.    Condition: stable  Disposition:  home      Diagnosis/Diagnoses:  Rash    Cyril Loosen, PA-C  Emer[facility]    This Emergency Department patient encounter note was created using voice-recognition software and in real time during the ED visit. Please excuse any typographical errors that have not been edited out.

## 2020-09-21 NOTE — Narrator Note (Signed)
Patient Disposition  Patient education for diagnosis, medications, activity, diet and follow-up.  Patient left ED 4:18 PM.  Patient rep received written instructions.    Interpreter to provide instructions: Yes; Interpreter ID: phone    Patient belongings with patient: YES    Have all existing LDAs been addressed? N/A    Have all IV infusions been stopped? N/A    Destination: Discharged to home

## 2020-09-21 NOTE — Progress Notes (Deleted)
Hypertension Management Initial Visit with VALIDATION TOOL FOR SELF MONITORING BLOOD PRESSURE CUFF     Subjective:  Interpreter used: {EZINTERPRETER:16314}.    Mariah [name] is a 64 year old female  here for hypertension initial visit and BP Cuff validation. PMH*** Last seen 08/24/20 by pharmacotherapy    2.   Blood Pressure:   Last BP was above goal (<130/80 per ACC/AHA guidelines).  Assessment: Unable to assess due to nature of visit.   Intervention:   Medication:  Continue losartan 50 mg tablets -  2 tablets daily (TDD: 100 mg)  Continue hydrochlorothiazide 25 mg daily.  Potential next steps:  Switching hydrochlorothiazide to chlorthalidone.   Adding CCB.   Limiting pill burden. Switch losartan tablet from 50 mg to 100 mg tablet. If staying on both losartan and hydrochlorothiazide consider combination tablet of 100/25 mg daily.  Diet/Exercise:   Discussed impact of salt on blood pressure. Advised patient to cut back on adding salt to meals. Discussed trying other spices in place of salt (e.g. garlic, pepper)      Medications for hypertension include :  1. Losartan 50mg  daily (2 tabs) TDD 100mg    2. HCTZ 25 mg     Previous HTN medication trials:  - Irbesartan  - Lisinopril (caused cough)     Took BP meds today:{YES(DETAIL)/NO(DETAIL):10823}  Patient reported adherence: {BP Med adherence:18169::"Patient reports taking medication every day"}  Side effects from current regimen: ***    Self Monitoring of BP:  The patient is using a digital machine: {digitcalcuff:18150}  Machines to be validated in clinic annually. Last validated in clinic on: {BPcuffvalidation:18096}    Signs/symptoms of hypotension: {ezsxhypotension:17624}  Signs/symptoms of hypertension: {ezsxhypertension:17625}    SH: (smoking/etoh): ***    Diet:   - Caffeine: ***  - Sodium: ***    Physical activity:  {EZPHYSICALACTIVITY:16311}  Objective:   Arm circumference: *** '  Today's BP: ***  Most Recent BP Reading(s)  07/26/20 : 121/91  12/27/18 :  137/84  07/09/18 : 134/82  04/09/18 : 147/77  11/15/17 : 156/85        Blood pressure cuff validation per AMA 2020  STEP 1: Complete the table below.  5 blood pressure readings taken using a combination of the patient's SMBP device and the office's method of blood pressure measurement.     MEASUREMENT DEVICE SYSTOLIC BLOOD PRESSURE (SBP)   A PATIENT'S    B PATIENT'S    C OFFICE'S    D PATIENT'S    E OFFICE'S      STEP 2:    Average measurements B and D ((B+D)/2): ***   The difference of average of B and D (above) and measurement C (Above - C): ***  The difference is: {BPValidateSys:18092}    STEP 3:   Average measurements  ((C+E)/2): ***   The difference of average of C and E (above) - measurement D  (Above - D): ***  The difference is: {BPCUFFVALIDATE 2:18093}    ASSESSMENT/RECOMMENDATIONS    1. Patient's BP cuff has {bpcuffcalibration:18097}  a. If validated, Problem List updated to include ". Bpvalidationbp":  {YES/DEFAULT NO:15777}  2. Blood pressure self measurement education included review of cuff size, body position, and proper measurement technique.   3.    Blood Pressure:   a. Last BP was *** goal ({EZBPGOAL:16316} ).   b. Assessment/Intervention:   i. Medication: ***  ii. Diet/Exercise: ***  iii. Lab Monitoring next due: ***  4.  Medication Reconciliation  c. Medication reconciliation was  completed today {ptxmedreccompleted:14495}.  d. Refills submitted: ***    Conditions addressed during this visit: {List of Conditions:16676}  Follow-up scheduled with {pharm/pcp:18098}. Patient prefers {visit preference:18071}    Encouraged patient to keep scheduled pharmacotherapy visits and to call site pharmacist with questions.

## 2020-09-23 ENCOUNTER — Encounter (HOSPITAL_BASED_OUTPATIENT_CLINIC_OR_DEPARTMENT_OTHER): Payer: Self-pay

## 2020-09-23 ENCOUNTER — Emergency Department (HOSPITAL_BASED_OUTPATIENT_CLINIC_OR_DEPARTMENT_OTHER): Payer: No Typology Code available for payment source

## 2020-09-23 ENCOUNTER — Inpatient Hospital Stay
Admission: EM | Admit: 2020-09-23 | Discharge: 2020-09-27 | DRG: 224 | Disposition: A | Discharge status: Home / Self Care | Payer: No Typology Code available for payment source | Attending: Surgery | Admitting: Surgery

## 2020-09-23 ENCOUNTER — Other Ambulatory Visit: Payer: Self-pay

## 2020-09-23 DIAGNOSIS — I10 Essential (primary) hypertension: Secondary | ICD-10-CM | POA: Diagnosis present

## 2020-09-23 DIAGNOSIS — G8918 Other acute postprocedural pain: Secondary | ICD-10-CM | POA: Diagnosis not present

## 2020-09-23 DIAGNOSIS — K760 Fatty (change of) liver, not elsewhere classified: Secondary | ICD-10-CM | POA: Diagnosis not present

## 2020-09-23 DIAGNOSIS — Z4659 Encounter for fitting and adjustment of other gastrointestinal appliance and device: Secondary | ICD-10-CM | POA: Diagnosis not present

## 2020-09-23 DIAGNOSIS — K6389 Other specified diseases of intestine: Secondary | ICD-10-CM | POA: Diagnosis not present

## 2020-09-23 DIAGNOSIS — K449 Diaphragmatic hernia without obstruction or gangrene: Secondary | ICD-10-CM | POA: Diagnosis present

## 2020-09-23 DIAGNOSIS — K56609 Unspecified intestinal obstruction, unspecified as to partial versus complete obstruction: Secondary | ICD-10-CM | POA: Diagnosis present

## 2020-09-23 DIAGNOSIS — Z20822 Contact with and (suspected) exposure to covid-19: Secondary | ICD-10-CM | POA: Diagnosis present

## 2020-09-23 DIAGNOSIS — Z9884 Bariatric surgery status: Secondary | ICD-10-CM | POA: Diagnosis not present

## 2020-09-23 DIAGNOSIS — Z87891 Personal history of nicotine dependence: Secondary | ICD-10-CM

## 2020-09-23 DIAGNOSIS — K565 Intestinal adhesions [bands], unspecified as to partial versus complete obstruction: Principal | ICD-10-CM | POA: Diagnosis present

## 2020-09-23 DIAGNOSIS — Z8719 Personal history of other diseases of the digestive system: Secondary | ICD-10-CM | POA: Insufficient documentation

## 2020-09-23 LAB — CBC, PLATELET & DIFFERENTIAL
ABSOLUTE BASO COUNT: 0 10*3/uL (ref 0.0–0.1)
ABSOLUTE EOSINOPHIL COUNT: 0 10*3/uL (ref 0.0–0.8)
ABSOLUTE IMM GRAN COUNT: 0.03 10*3/uL (ref 0.00–0.03)
ABSOLUTE LYMPH COUNT: 1.1 10*3/uL (ref 0.6–5.9)
ABSOLUTE MONO COUNT: 0.4 10*3/uL (ref 0.2–1.4)
ABSOLUTE NEUTROPHIL COUNT: 6.8 10*3/uL (ref 1.6–8.3)
ABSOLUTE NRBC COUNT: 0 10*3/uL (ref 0.0–0.0)
BASOPHIL %: 0.4 % (ref 0.0–1.2)
EOSINOPHIL %: 0.4 % (ref 0.0–7.0)
HEMATOCRIT: 42.5 % (ref 34.1–44.9)
HEMOGLOBIN: 13.6 g/dL (ref 11.2–15.7)
IMMATURE GRANULOCYTE %: 0.4 % (ref 0.0–0.4)
LYMPHOCYTE %: 12.6 % — ABNORMAL LOW (ref 15.0–54.0)
MEAN CORP HGB CONC: 32 g/dL (ref 31.0–37.0)
MEAN CORPUSCULAR HGB: 27.9 pg (ref 26.0–34.0)
MEAN CORPUSCULAR VOL: 87.3 fl (ref 80.0–100.0)
MEAN PLATELET VOLUME: 9.8 fL (ref 8.7–12.5)
MONOCYTE %: 5.2 % (ref 4.0–13.0)
NEUTROPHIL %: 81 % — ABNORMAL HIGH (ref 40.0–75.0)
NRBC %: 0 % (ref 0.0–0.0)
PLATELET COUNT: 282 10*3/uL (ref 150–400)
RBC DISTRIBUTION WIDTH STD DEV: 43.8 fL (ref 35.1–46.3)
RED BLOOD CELL COUNT: 4.87 M/uL (ref 3.90–5.20)
WHITE BLOOD CELL COUNT: 8.3 10*3/uL (ref 4.0–11.0)

## 2020-09-23 LAB — HEPATIC FUNCTION PANEL
ALANINE AMINOTRANSFERASE: 45 U/L (ref 12–45)
ALBUMIN: 4.3 g/dL (ref 3.4–5.0)
ALKALINE PHOSPHATASE: 192 U/L — ABNORMAL HIGH (ref 45–117)
ASPARTATE AMINOTRANSFERASE: 28 U/L (ref 8–34)
BILIRUBIN DIRECT: 0.1 mg/dl (ref 0.0–0.2)
BILIRUBIN TOTAL: 0.6 mg/dL (ref 0.2–1.0)
INDIRECT BILIRUBIN: 0.5 mg/dL (ref 0.2–0.9)
TOTAL PROTEIN: 8.2 g/dL (ref 6.4–8.2)

## 2020-09-23 LAB — COVID-19 INPATIENT: COVID-19 INPATIENT: NEGATIVE

## 2020-09-23 LAB — BASIC METABOLIC PANEL
ANION GAP: 11 mmol/L (ref 5–15)
BUN (UREA NITROGEN): 22 mg/dL — ABNORMAL HIGH (ref 7–18)
CALCIUM: 9.6 mg/dL (ref 8.5–10.1)
CARBON DIOXIDE: 29 mmol/L (ref 21–32)
CHLORIDE: 104 mmol/L (ref 98–107)
CREATININE: 1 mg/dL (ref 0.4–1.2)
ESTIMATED GLOMERULAR FILT RATE: >60 mL/min (ref >60)
Glucose Random: 154 mg/dL (ref 74–160)
POTASSIUM: 4.5 mmol/L (ref 3.5–5.1)
SODIUM: 144 mmol/L (ref 136–145)

## 2020-09-23 LAB — LIPASE: LIPASE: 323 U/L (ref 73–393)

## 2020-09-23 LAB — MAGNESIUM: MAGNESIUM: 2 mg/dL (ref 1.8–2.4)

## 2020-09-23 MED ORDER — INFLUENZA VAC SPLIT QUAD 0.5 ML IM SUSY
0.50 mL | PREFILLED_SYRINGE | Freq: Once | INTRAMUSCULAR | Status: AC
Start: 2020-09-23 — End: 2020-09-23
  Administered 2020-09-23: 0.5 mL via INTRAMUSCULAR
  Filled 2020-09-23: qty 0.5

## 2020-09-23 MED ORDER — LIDOCAINE HCL URETHRAL/MUCOSAL 2 % EX PRSY
PREFILLED_SYRINGE | CUTANEOUS | Status: AC
Start: 2020-09-23 — End: 2020-09-23
  Filled 2020-09-23: qty 6

## 2020-09-23 MED ORDER — IRBESARTAN 300 MG PO TABS
300.0000 mg | ORAL_TABLET | Freq: Every day | ORAL | Status: DC
Start: 2020-09-23 — End: 2020-09-27
  Administered 2020-09-23 – 2020-09-27 (×5): 300 mg via ORAL
  Filled 2020-09-23 (×5): qty 1

## 2020-09-23 MED ORDER — ALUMINUM-MAGNESIUM-SIMETHICONE 200-200-20 MG/5ML PO SUSP
30.00 mL | Freq: Once | ORAL | Status: AC
Start: 2020-09-23 — End: 2020-09-23
  Administered 2020-09-23: 30 mL via ORAL
  Filled 2020-09-23: qty 30

## 2020-09-23 MED ORDER — FAMOTIDINE 20 MG/2ML IV SOLN
20.0000 mg | Freq: Once | INTRAVENOUS | Status: AC
Start: 2020-09-23 — End: 2020-09-23
  Administered 2020-09-23: 20 mg via INTRAVENOUS
  Filled 2020-09-23: qty 2

## 2020-09-23 MED ORDER — DIPHENHYDRAMINE HCL 50 MG/ML IJ SOLN
25.00 mg | Freq: Once | INTRAMUSCULAR | Status: AC
Start: 2020-09-23 — End: 2020-09-23
  Administered 2020-09-23: 25 mg via INTRAVENOUS
  Filled 2020-09-23: qty 1

## 2020-09-23 MED ORDER — MORPHINE SULFATE 4 MG/ML IV SOLN (SUPER ERX)
4.0000 mg | Status: DC | PRN
Start: 2020-09-23 — End: 2020-09-25

## 2020-09-23 MED ORDER — ENOXAPARIN SODIUM 40 MG/0.4ML SC SOLN
40.0000 mg | SUBCUTANEOUS | Status: DC
Start: 2020-09-23 — End: 2020-09-27
  Administered 2020-09-23 – 2020-09-26 (×4): 40 mg via SUBCUTANEOUS
  Filled 2020-09-23 (×4): qty 1

## 2020-09-23 MED ORDER — MORPHINE SULFATE 2 MG/ML IV SOLN (SUPER ERX)
2.00 mg | Freq: Once | Status: AC
Start: 2020-09-23 — End: 2020-09-23
  Administered 2020-09-23: 2 mg via INTRAVENOUS
  Filled 2020-09-23: qty 1

## 2020-09-23 MED ORDER — HYDROCHLOROTHIAZIDE 25 MG PO TABS
25.0000 mg | ORAL_TABLET | Freq: Every day | ORAL | Status: DC
Start: 2020-09-23 — End: 2020-09-26
  Administered 2020-09-23 – 2020-09-25 (×3): 25 mg via ORAL
  Filled 2020-09-23 (×3): qty 1

## 2020-09-23 MED ORDER — LIDOCAINE HCL URETHRAL/MUCOSAL 2 % EX GEL
Freq: Once | CUTANEOUS | Status: AC
Start: 2020-09-23 — End: 2020-09-23

## 2020-09-23 MED ORDER — KETOROLAC TROMETHAMINE 15 MG/ML IJ SOLN
15.00 mg | Freq: Once | INTRAMUSCULAR | Status: AC
Start: 2020-09-23 — End: 2020-09-24
  Filled 2020-09-23: qty 1

## 2020-09-23 MED ORDER — VALACYCLOVIR HCL 500 MG PO TABS
1000.0000 mg | ORAL_TABLET | Freq: Three times a day (TID) | ORAL | Status: DC
Start: 2020-09-23 — End: 2020-09-27
  Administered 2020-09-23 – 2020-09-27 (×12): 1000 mg via ORAL
  Filled 2020-09-23 (×12): qty 2

## 2020-09-23 MED ORDER — BENZOCAINE-MENTHOL 15-3.6 MG MT LOZG
1.0000 | LOZENGE | Freq: Four times a day (QID) | OROMUCOSAL | Status: DC | PRN
Start: 2020-09-23 — End: 2020-09-27
  Administered 2020-09-24 – 2020-09-25 (×2): 3.6 mg via BUCCAL
  Filled 2020-09-23 (×2): qty 1

## 2020-09-23 MED ORDER — MORPHINE SULFATE 2 MG/ML IV SOLN (SUPER ERX)
2.0000 mg | Status: DC | PRN
Start: 2020-09-23 — End: 2020-09-25
  Administered 2020-09-23 (×2): 2 mg via INTRAVENOUS
  Filled 2020-09-23 (×2): qty 1

## 2020-09-23 MED ORDER — ONDANSETRON HCL 4 MG/2ML IJ SOLN
4.0000 mg | Freq: Once | INTRAMUSCULAR | Status: AC
Start: 2020-09-23 — End: 2020-09-23
  Administered 2020-09-23: 4 mg via INTRAVENOUS
  Filled 2020-09-23: qty 2

## 2020-09-23 MED ORDER — IOHEXOL 350 MG/ML IV SOLN
85.00 mL | Freq: Once | INTRAVENOUS | Status: AC
Start: 2020-09-23 — End: 2020-09-23
  Administered 2020-09-23: 85 mL via INTRAVENOUS

## 2020-09-23 MED ORDER — LIDOCAINE HCL URETHRAL/MUCOSAL 2 % EX GEL
Freq: Once | CUTANEOUS | Status: DC
Start: 2020-09-23 — End: 2020-09-23

## 2020-09-23 MED ORDER — LACTATED RINGERS IV SOLN
INTRAVENOUS | Status: AC
Start: 2020-09-23 — End: 2020-09-24

## 2020-09-23 MED ORDER — ONDANSETRON HCL 4 MG/2ML IJ SOLN
4.0000 mg | Freq: Three times a day (TID) | INTRAMUSCULAR | Status: DC | PRN
Start: 2020-09-23 — End: 2020-09-27
  Administered 2020-09-23 – 2020-09-25 (×2): 4 mg via INTRAVENOUS
  Filled 2020-09-23 (×2): qty 2

## 2020-09-23 MED ORDER — SODIUM CHLORIDE 0.9 % IV BOLUS
1000.0000 mL | Freq: Once | INTRAVENOUS | Status: AC
Start: 2020-09-23 — End: 2020-09-23
  Administered 2020-09-23: 1000 mL via INTRAVENOUS

## 2020-09-23 MED ORDER — NORMAL SALINE FLUSH 0.9 % IV SOLN
60.00 mL | Freq: Once | INTRAVENOUS | Status: AC
Start: 2020-09-23 — End: 2020-09-23
  Administered 2020-09-23: 60 mL via INTRAVENOUS

## 2020-09-23 MED ORDER — ONDANSETRON HCL 4 MG/2ML IJ SOLN
4.00 mg | Freq: Once | INTRAMUSCULAR | Status: AC
Start: 2020-09-23 — End: 2020-09-23
  Administered 2020-09-23: 4 mg via INTRAVENOUS
  Filled 2020-09-23: qty 2

## 2020-09-23 NOTE — Narrator Note (Signed)
Patient w elevated BP and states she has not taken her medication this morning due to vomiting. Provider [place] and surgical PA aware, orders to continue to monitor.

## 2020-09-23 NOTE — ED Provider Notes (Signed)
[facility] Emergency Medicine Attending Note      History of Present Illness:    Mariah [name] is a 64 year old female who came to the ED with husband for evaluation of nausea/vomiting and diffuse abdominal discomfort starting yesterday, last vomited about 10 minutes ago.  No diarrhea or fevers.  Took some Tylenol over-the-counter.  She says the pain is "10 out of 10".  She is prescribed Prilosec, but not taking it.  She denies any prior surgeries although her record clearly indicates she had gastric bypass surgery at [facility] 4 years ago.      No one else at home is sick.  No recent travel or antibiotics.  Of note she was here in the ED 2 days ago for rash.   Allye Hoyos   MRN: [id number]  PCP: Lawernce Pitts, APRN    Arrived to the ED by: Relative    History provided by: the patient using a [place] interpreter  Other: Nursing notes, medical records here/elsewhere as available through Epic    Review of Systems:  All other systems are reviewed and are negative except as noted.     Expect Note (if available):         Past Medical Hx:  Past Medical History:  03/27/2006: ABN FIND-STOOL CONTENTS-OCC BLOOD      Comment:  s/p colonoscopy and EGD 7/07  06/04/2006: ABSENCE OF MENSTRUATION - s/p TAH BSO  08/05/2004: ANXIETY DEPRESSION      Comment:  TSH wnl 8/02; trial of fluoxetine 8/05  08/05/2004: BACK PAIN LOW      Comment:  LBP musculoskeletal most likely 1/04  04/23/2006: Cough      Comment:  s/p PFTs September 2007 --  some obstruction at mid to                low lung volumes not correctable by bronchodilator CT                scan showed stable pulmonary nodules over several years                11/07 - cough gone ** note combined with dx of "nodules                on CT scan". Will file this note to hx.     08/05/2004: depressive disorder      Comment:  TSH wnl 8/02; trial of fluoxetine x 6 wks didn't really                help 8/05  trial of citalopram per Dr. Patrice Paradise. later                resolved. 04/15/2014 - Reassessed, pt  reports feeling                well, with no sx of depression. Just reports being tired,               which she attributes to current life style. Will resolve                from problem list.    03/27/2006: Diarrhea      Comment:  7/07 The colonoscopy was visually normal and biopsies                taken to rule out microscopic colitis were negative.  A                biopsy of the descending duodenum to  rule out celiac                sprue because of the diarrhea was negative.    12/19/2012: Gastritis      Comment:  Combined with entry "esophageal reflux    10/22/2006: Hyperparathyroidism      Comment:  osteoporosis found - pt referred to surgery for the                hyperparathyroidism- s/p parathyroidectomy feb 2008  06/18/2012: Inconclusive mammogram      Comment:  June 2013 - BIRADS 3. Probable benign mammogram.                Recommend 6 month  follow-up mammogram (Dec 2013).                 Dec/13 - Stable left breast nodule with no evidence of                malignancy. BIRADS 2 benign recommend annual screening                mammogram in 6 months  (June 2014) June/14 - mammogram                request given to pt, she will call to schedule:  Jan/15 -               normal mammogram, yearly screening. Resolve from problem                list.        03/13/2006: Insomnia, unspecified      Comment:  06/25/2012 - intermittent, usually resolved by taking                Tylenol PM 06/09/2014 - good response to trazodone, takes                one to 4x wk, prn 08/24/2016 - reports no problems sleeping               - file to hx, and resolve from problem list.   03/27/2006: nodules on chest CT 1/06 and 3/07      Comment:  s/p PFTs September 2007 --  some obstruction at mid to                low lung volumes not correctable by bronchodilator CT                scan showed stable pulmonary nodules over several years                11/07 - cough gone  04/29/12 - saw pneumologist, Dr Jordan Hawks,               "Will repeat chest CT  with high-resolution scans at the 2               month mark (already ordered). If abnormal, would do PFT's               and further rheum w/u, consider lung bx . Pt to call if                any r  08/05/2004: OBESITY NOS      Comment:  has tried meds from [place]  08/05/2004: PERS HX OF PAST NONCOMPLIANCE      Comment:  poor compliance to med regimen  08/05/2004: PERS HX TOBACCO USE      Comment:  quit 10/02;  1/2 PPD since 64 YO  08/05/2004: Screening for hypertension      Comment:  occ elevated BP started on anti-HTN in [place]; chem 7,                EKG, HCT u/a wnl 4/03; d/c'ed HCTZ 8/05 when we learned                from daughter she has not been taking it  05/19/2008: Vitamin D deficiency      Comment:  Supplement per dr Jake Samples. Meds:  No current facility-administered medications for this encounter.     Current Outpatient Medications   Medication Sig    valACYclovir (VALTREX) 500 MG tablet Take 2 tablets by mouth 3 (three) times daily  for 7 days    escitalopram (LEXAPRO) 20 MG tablet Take 1 tablet by mouth daily    losartan (COZAAR) 50 MG tablet Take 2 tablets by mouth daily    omeprazole (PRILOSEC) 20 MG capsule TAKE 1 CAPSULE BY MOUTH DAILY    cholecalciferol (VITAMIN D3) 1000 UNIT tablet Take 1 tablet by mouth daily    hydrochlorothiazide (HYDRODIURIL) 25 MG tablet Take 1 tablet by mouth daily         Past Surgical Hx:  Past Surgical History:  04/2016: BARIATRIC SURGERY      Comment:  done at [place]  2006: PARATHYROIDECTOMY      Comment:  partial,   5/00: SALPINGO-OOPHORECTOMY COMPL/PRTL UNI/BI SPX      Comment:  Salpingo-Oophorectomy, bilateral  No date: TONSILLECTOMY ONE-HALF <AGE 29  5/00: TOTAL ABDOMINAL HYSTERECT W/WO RMVL TUBE OVARY      Comment:  Hysterectomy, Total Abdominal secondary to fibroids Allergies:  Review of Patient's Allergies indicates:   Heparin                     Comment:local rash seen during hospitalization Feb 2008   Lisinopril              Cough   Social  Hx:  Social History    Tobacco Use      Smoking status: Former Smoker        Packs/day: 0.50        Years: 40.00        Pack years: 20        Types: Cigarettes        Quit date: 03/31/2012        Years since quitting: 8.4      Smokeless tobacco: Former Systems developer      Tobacco comment: 3-4 cigarretes a day. Quit about 1 month ago    Alcohol use: Yes      Comment: very occasional   Immunizations:  Immunization History   Administered Date(s) Administered    Covid-19 Vaccine [employer]) 03/15/2020, 04/05/2020    INFLUENZA VIRUS TRI W/PRESV VACCINE 18/> YRS IM (PRIVATE) 10/22/2006, 09/12/2012    Influenza Virus Quad Presv Free Vacc 6 Mo and Older, IM 10/25/2017    Influenza Virus Vac, Quad (CCIIV4)IM 03/30/2016    PPD 05/18/2006    Tdap 10/22/2006, 10/25/2017        Physical Examination:    ED Triage Vitals [09/23/20 0720]   ED Triage Vitals Brief Group      Temp 98 F      Pulse 75      Resp 16      BP 125/88      SpO2 100 %      Pain Score  10         General: Patient looks mildly uncomfortable, nontoxic    Eyes: PERRL, anicteric    Head, ears, nose, and throat: Normocephalic and atraumatic. Moist mucous membranes. Normal phonation.    Respiratory/chest: No respiratory distress, speaks in full sentences.     Cardiovascular: Heart rate is regular in rate and rhythm. Pulses are 2+ and symmetric.     Gastrointestinal: Abdomen is soft, diffusely tender, not distended    Musculoskeletal: Normal muscle tone, moving all extremities, normal gait.    Neurologic: Alert and oriented     Medications Given in the ED:    Medications   sodium chloride 0.9 % IV bolus 1,000 mL (0 mLs Intravenous Stopped 09/23/20 0945)   ondansetron (ZOFRAN) injection 4 mg (4 mg Intravenous Given 09/23/20 0731)   famotidine (PEPCID) injection 20 mg (20 mg Intravenous Given 09/23/20 0731)   aluminum-magnesium hydroxide-simethicone (MYLANTA II) 200-200-20 MG/5ML suspension 30 mL (30 mLs Oral Given 09/23/20 0735)   iohexol (OMNIPAQUE) 350 MG/ML injection 85 mL (85  mLs Intravenous Given 09/23/20 0831)   sodium chloride 0.9 % flush 60 mL (60 mLs Intravenous Given 09/23/20 0830)   lidocaine (GLYDO) 2 % jelly (  Unlinkable Override 09/23/20 0945)   lidocaine (XYLOCAINE) 2 % jelly ( Topical Given 09/23/20 0945)    Radiology and ECG:    XR Chest Portable   ED Interpretation    NG tube passed diaphgragm      Final Result     CT Abdomen & Pelvis W IV Contrast   Final Result             Lab Results:    Labs Reviewed   CBC, PLATELET & DIFFERENTIAL - Abnormal; Notable for the following components:       Result Value    NEUTROPHIL % 81.0 (*)     LYMPHOCYTE % 12.6 (*)     All other components within normal limits   BASIC METABOLIC PANEL - Abnormal; Notable for the following components:    BUN (UREA NITROGEN) 22 (*)     All other components within normal limits   HEPATIC FUNCTION PANEL - Abnormal; Notable for the following components:    ALKALINE PHOSPHATASE 192 (*)     All other components within normal limits   MAGNESIUM   LIPASE   COVID-19 INPATIENT    Vital Signs:     09/23/20  0720   BP: 125/88   Pulse: 75   Resp: 16   Temp: 98 F   SpO2: 100%   Weight: 88 kg (194 lb)          ED Course and Medical Decision Making:    Looks well, seem like likely viral gastroenteritis until I noticed in her chart that she had gastric bypass surgery.  Will get labs, symptomatic treatment here, will get a CT scan with p.o./IV contrast.    ED Course as of Sep 24 1039   Thu Sep 23, 2020   1039 Seen by general surgery, admitted to their service.      0160 Back from CT, feels much better        CT scan shows small bowel obstruction with transition point around the ileocecal valve.  NG tube placed by nurse, good position on x-ray.  General surgery was consulted, admitted to their service.    ED Disposition:     Impression(s):  SBO (small bowel obstruction) (HCC)    Condition: Stable    Disposition:  Admit To [facility]    Signed by Dr. Freddie Breech. Glori Bickers, DO, [place]  Emergency Medicine Attending  [facility]

## 2020-09-23 NOTE — ED Triage Note (Signed)
Pt presents c/o diffuse abd pain and vomiting x 1 night. Took tylenol this morning. Denies diarrhea.

## 2020-09-23 NOTE — Plan of Care (Addendum)
Problem: Pain  Goal: Patient's pain/discomfort is manageable  Description: Assess and monitor patient's pain using appropriate pain scale. Collaborate with interdisciplinary team and initiate plan and interventions as ordered. Re-assess patient's pain level 30 - 60 minutes after pain management intervention.   Outcome: Progressing    This A/O 64 yr old F admitted on stretcher via ED with NGT for SBO. NGT placement checked using Rightspot PH then connected to intermittent low wall suction.  Pt denied abd pain on admission.  HX of shingles on Back of neck and L side of neck as red rash. Denied pain on shingle areas. Contact precaution initiated. Bilateral SCD applied and tolerated. NPO, Iv LR @ 125 cc/hr started as ordered, Po meds crushed given via NGT and tolerated.  Dr Julieanne Cotton saw pt. Up to br with assist. Voided, no bm.  PA Estill Bamberg ordered to continue the NGT hold off cancelled order.  NGT patent, no output.   Pt's own cyclovir med sent to hosp safe. Pt has wallet with credit card. Declined that sent to [facility] safe.  1450: prn iv Morphine given for H/A 9/10. 1458: prn iv Zofran given for nausea. See MAR. Effect pending.PA notified of H/A. Toradol ordered. Report given to 3 to 11 nurse.

## 2020-09-23 NOTE — Plan of Care (Addendum)
Pt A&Ox3. [place] interpreter used for all care. IVF infusing per order. NGT patent and draining small milky drainage. Endorsed headache and PRN morphine given with good effect. SCD boot on. OOB to BR independently. Meds crushed and given via NGT and clamped for 94mns. NPO status maintained. Shingles rash noted on back of neck and side of neck.  Call light and personal items within reach. Needs met. Safety maintained.    Spoke with ID RN in regards to precaution for shingles. Per ID RN since lesions are not disseminated and only on one dermatomes. Pt to remain on contact isolation; airborne isolation not required.       Problem: Pain  Goal: Patient's pain/discomfort is manageable  Description: Assess and monitor patient's pain using appropriate pain scale. Collaborate with interdisciplinary team and initiate plan and interventions as ordered. Re-assess patient's pain level 30 - 60 minutes after pain management intervention.   Outcome: Progressing     Problem: Knowledge Deficit  Goal: Patient/S.O. demonstrates understanding of disease process, treatment plan, medications, and discharge instructions.  Description: Complete learning assessment and assess knowledge base  Outcome: Progressing     Problem: Insufficient Fluid Volume  Goal: Fluid and electrolyte balance are achieved/maintained  Description: Assess and monitor vital signs (orthostatic vitals if applicable), fluid intake and output, urine color, labs, skin turgor, mucous membranes, mental status, and gastrointestinal system for nausea, vomiting and diarrhea.  Monitor for signs and symptoms of hypovolemia (tachycardia, rapid breathing, decreased urine output, postural hypotension, confusion, syncope).  Collaborate with interdisciplinary team and initiate plan and interventions as ordered.  Outcome: Progressing     Problem: Inadequate Coping  Goal: Demonstrates ability to cope effectively  Description: Patient is able to verbalize feelings related to emotional  state.  Outcome: Progressing

## 2020-09-23 NOTE — H&P (Signed)
SURGERY H&P      CC:  Diffuse abd pain and vomiting x1 night    HPI: The patient is a 64 year old female, w/ PMH of gastric bypass surgery ([id number] at [facility], uncomplicated), c-section x2, TAH, b/l salpingo-oophorectomy, gastritis and HTN, px with a 3 day hx of abdominal pain and 1 night of vomiting.  Patient reports that she has been able to take some food PO with the recent abdominal pain but last night began to have increased pain and vomiting.  She reports taking antacids for her pain that did not help much.  Her last bowel movement was yesterday, she normally goes 2-3 times per day, and she does not remember the last time she passed gas.      Past Medical History:  03/27/2006: ABN FIND-STOOL CONTENTS-OCC BLOOD      Comment:  s/p colonoscopy and EGD 7/07  06/04/2006: ABSENCE OF MENSTRUATION - s/p TAH BSO  08/05/2004: ANXIETY DEPRESSION      Comment:  TSH wnl 8/02; trial of fluoxetine 8/05  08/05/2004: BACK PAIN LOW      Comment:  LBP musculoskeletal most likely 1/04  04/23/2006: Cough      Comment:  s/p PFTs September 2007 --  some obstruction at mid to                low lung volumes not correctable by bronchodilator CT                scan showed stable pulmonary nodules over several years                11/07 - cough gone ** note combined with dx of "nodules                on CT scan". Will file this note to hx.     08/05/2004: depressive disorder      Comment:  TSH wnl 8/02; trial of fluoxetine x 6 wks didn't really                help 8/05  trial of citalopram per Dr. Patrice Paradise. later                resolved. 04/15/2014 - Reassessed, pt reports feeling                well, with no sx of depression. Just reports being tired,               which she attributes to current life style. Will resolve                from problem list.    03/27/2006: Diarrhea      Comment:  7/07 The colonoscopy was visually normal and biopsies                taken to rule out microscopic colitis were negative.  A                biopsy of the  descending duodenum to rule out celiac                sprue because of the diarrhea was negative.    12/19/2012: Gastritis      Comment:  Combined with entry "esophageal reflux    10/22/2006: Hyperparathyroidism      Comment:  osteoporosis found - pt referred to surgery for the                hyperparathyroidism- s/p parathyroidectomy feb  2008  06/18/2012: Inconclusive mammogram      Comment:  June 2013 - BIRADS 3. Probable benign mammogram.                Recommend 6 month  follow-up mammogram (Dec 2013).                 Dec/13 - Stable left breast nodule with no evidence of                malignancy. BIRADS 2 benign recommend annual screening                mammogram in 6 months  (June 2014) June/14 - mammogram                request given to pt, she will call to schedule:  Jan/15 -               normal mammogram, yearly screening. Resolve from problem                list.        03/13/2006: Insomnia, unspecified      Comment:  06/25/2012 - intermittent, usually resolved by taking                Tylenol PM 06/09/2014 - good response to trazodone, takes                one to 4x wk, prn 08/24/2016 - reports no problems sleeping               - file to hx, and resolve from problem list.   03/27/2006: nodules on chest CT 1/06 and 3/07      Comment:  s/p PFTs September 2007 --  some obstruction at mid to                low lung volumes not correctable by bronchodilator CT                scan showed stable pulmonary nodules over several years                11/07 - cough gone  04/29/12 - saw pneumologist, Dr Jordan Hawks,               "Will repeat chest CT with high-resolution scans at the 2               month mark (already ordered). If abnormal, would do PFT's               and further rheum w/u, consider lung bx . Pt to call if                any r  08/05/2004: OBESITY NOS      Comment:  has tried meds from [place]  08/05/2004: PERS HX OF PAST NONCOMPLIANCE      Comment:  poor compliance to med regimen  08/05/2004: PERS HX TOBACCO USE       Comment:  quit 10/02; 1/2 PPD since 64 YO  08/05/2004: Screening for hypertension      Comment:  occ elevated BP started on anti-HTN in [place]; chem 7,                EKG, HCT u/a wnl 4/03; d/c'ed HCTZ 8/05 when we learned                from daughter she has not been taking it  05/19/2008: Vitamin D deficiency  Comment:  Supplement per dr Jake Samples.  Past Surgical History:  04/2016: BARIATRIC SURGERY      Comment:  done at [facility]  2006: PARATHYROIDECTOMY      Comment:  partial,   5/00: SALPINGO-OOPHORECTOMY COMPL/PRTL UNI/BI SPX      Comment:  Salpingo-Oophorectomy, bilateral  No date: TONSILLECTOMY ONE-HALF <AGE 58  5/00: TOTAL ABDOMINAL HYSTERECT W/WO RMVL TUBE OVARY      Comment:  Hysterectomy, Total Abdominal secondary to fibroids  Review of Patient's Allergies indicates:   Heparin                     Comment:local rash seen during hospitalization Feb 2008   Lisinopril              Cough      Social History:  Social History    Tobacco Use      Smoking status: Former Smoker        Packs/day: 0.50        Years: 40.00        Pack years: 20        Types: Cigarettes        Quit date: 03/31/2012        Years since quitting: 8.4      Smokeless tobacco: Former Systems developer      Tobacco comment: 3-4 cigarretes a day. Quit about 1 month ago    Alcohol use: Yes      Comment: very occasional    Tobacco Use:   Social History    Tobacco Use      Smoking status: Former Smoker        Packs/day: 0.50        Years: 40.00        Pack years: 20        Types: Cigarettes        Quit date: 03/31/2012        Years since quitting: 8.4      Smokeless tobacco: Former Systems developer      Tobacco comment: 3-4 cigarretes a day. Quit about 1 month ago      Alcohol:     Alcohol use Yes   Comment: very occasional       Family History:    Review of patient's family history indicates:  Problem: No Known Problems      Relation: Father          Age of Onset: (Not Specified)  Problem: Cancer - Lung      Relation: Father          Age of Onset: (Not  Specified)          Comment: died from lung CA- throat cancer per pt. Also on dyalisis               for 10 years  Problem: Hypertension      Relation: Mother          Age of Onset: (Not Specified)  Problem: Lipids      Relation: Mother          Age of Onset: (Not Specified)          Comment: hyperlipidemia  Problem: Heart      Relation: Maternal Uncle          Age of Onset: (Not Specified)          Comment: died 90 from MI  Problem: Diabetes      Relation: Maternal Aunt  Age of Onset: (Not Specified)  Problem: Diabetes      Relation: Mother          Age of Onset: (Not Specified)          Comment: also morbidly weight  Problem: Cancer - Other      Relation: Brother          Age of Onset: (Not Specified)          Comment: liver cancer, Sept 2013  Problem: Non-contributory      Relation: Brother          Age of Onset: (Not Specified)          Comment: no first deg rel. w/ breast ca  Problem: No Known Problems      Relation: Sister          Age of Onset: (Not Specified)  Problem: No Known Problems      Relation: Daughter          Age of Onset: (Not Specified)  Problem: No Known Problems      Relation: Maternal Grandmother          Age of Onset: (Not Specified)  Problem: No Known Problems      Relation: Maternal Grandfather          Age of Onset: (Not Specified)  Problem: No Known Problems      Relation: Paternal Grandmother          Age of Onset: (Not Specified)  Problem: No Known Problems      Relation: Paternal Grandfather          Age of Onset: (Not Specified)      Medications:  valACYclovir (VALTREX) 500 MG tablet, Take 2 tablets by mouth 3 (three) times daily  for 7 days, Disp: 42 tablet, Rfl: 0  escitalopram (LEXAPRO) 20 MG tablet, Take 1 tablet by mouth daily, Disp: 30 tablet, Rfl: 2  losartan (COZAAR) 50 MG tablet, Take 2 tablets by mouth daily, Disp: 180 tablet, Rfl: 3  omeprazole (PRILOSEC) 20 MG capsule, TAKE 1 CAPSULE BY MOUTH DAILY, Disp: 30 capsule, Rfl: 10  cholecalciferol (VITAMIN D3) 1000 UNIT  tablet, Take 1 tablet by mouth daily, Disp: 30 tablet, Rfl: 11  hydrochlorothiazide (HYDRODIURIL) 25 MG tablet, Take 1 tablet by mouth daily, Disp: 90 tablet, Rfl: 3      No current facility-administered medications on file prior to encounter.  valACYclovir (VALTREX) 500 MG tablet, Take 2 tablets by mouth 3 (three) times daily  for 7 days, Disp: 42 tablet, Rfl: 0  escitalopram (LEXAPRO) 20 MG tablet, Take 1 tablet by mouth daily, Disp: 30 tablet, Rfl: 2  losartan (COZAAR) 50 MG tablet, Take 2 tablets by mouth daily, Disp: 180 tablet, Rfl: 3  omeprazole (PRILOSEC) 20 MG capsule, TAKE 1 CAPSULE BY MOUTH DAILY, Disp: 30 capsule, Rfl: 10  cholecalciferol (VITAMIN D3) 1000 UNIT tablet, Take 1 tablet by mouth daily, Disp: 30 tablet, Rfl: 11  hydrochlorothiazide (HYDRODIURIL) 25 MG tablet, Take 1 tablet by mouth daily, Disp: 90 tablet, Rfl: 3          Review of Systems:  CONSTITUTIONAL: Negative for fevers, chills, night sweats, weight loss  NEURO: Patient reports a headache.  Denies any change in vision.  No  unexplained dizziness, muscle weakness, or paresthesia.   SKIN: Negative for rashes or lesions   CV: Negative for chest pain, palpatations  RESPIRATORY: Negative for SOB, difficulty breathing, wheezing  GI: Reports abdominal pain, nausea, vomiting.  Denies  diarrhea, reflux, bloody stool.  GU: Negative for dysuria, hematuria, suprapubic pain. TAH in 2000.  PSYCH: Negative for anxiety and depression   HEME: Negative for unexplained bruising or bleeding.   MUSK: Negative for any new myalgias      Physical Exam:  VITALS: BP (!) 165/96    Pulse 95    Temp 98 F (36.7 C)    Resp 20    Wt 88 kg (194 lb)    SpO2 95%    BMI 33.82 kg/m   CONSTITUTIONAL: Well-developed, well-nourished, and in no apparent distress   HEENT: sclera anicteric, moist mucus membranes  SKIN:  Dry and warm  CV: S1, S2 normal, no murmur, click, rub or gallop, regular rate and rhythm  RESP: chest clear to auscultation b/l, no wheezing, rales, normal  symmetric air entry  GI: Abdomen soft, slightly tender to palpation, mildly-distended. BS absent.   Lower Ext- No edema. No rashes.  No skin ulcers.  NEURO: A&Ox3, moving all four extremities spontaneously. Normal speech  PSYCH: Affect appropriate. Speech rate and thought content normal.      LABS:  BMP:   Recent Labs     09/23/20  0724   NA 144   K 4.5   CL 104   CO2 29   BUN 22*   CREAT 1.0   GLUCOSER 154     Ca, Mg, Phos:   Recent Labs     09/23/20  0724   CA 9.6   MG 2.0     CBC:   Recent Labs     09/23/20  0724   WBC 8.3   HCT 42.5   PLTA 282   RBC 4.87     Coagulation Labs: No results for input(s): PT, PTT, INR in the last 72 hours.  LFTs:   Recent Labs     09/23/20  0724   AST 28   ALT 45   TBILI 0.6   DBILI 0.1   ALKPHOS 192*     Pancreatic Enzymes:   Recent Labs     09/23/20  0724   LIP 323     Urinalysis:No results for input(s): UACOL, UACLA, UAGLU, UABIL, UAKET, SPEGRAVURINE, UAOCC, UAPH, UAPRO, UANIT, LEUKOCYTES in the last 72 hours.    Invalid input(s): UAOBU  Troponin: No results for input(s): TROPI in the last 72 hours.      IMAGING:   XR Chest Portable  TECHNIQUE: Portable chest,    INDICATION: NG tube placement    COMPARISON: March 20, 2012.    FINDINGS:    Quality: Satisfactory.       Tubes/lines: Nasogastric tube placement.    Lungs: The lungs are clear.     Pleura: There is no pleural effusion or pneumothorax.    Heart: The cardiac silhouette is unremarkable.     Mediastinum/hila: Unremarkable.    Bones and Soft Tissues: Unremarkable multiple surgical clips within the left upper quadrant of the abdomen.      IMPRESSION:    Nasogastric tube placement.          Reviewed and Electronically Signed by: Janeece Fitting MD   Signed Date/Time: 09-23-2020 10:19:40         CT Abdomen & Pelvis W IV Contrast  CLINICAL INDICATION: 64 years-old Female presents with Nausea/vomiting  h/o gastric bypass surgery.    COMPARISON: 07/26/2020 and 09/02/2016.    TECHNIQUE: CT of the abdomen and pelvis with multiplanar  reformats.  IV Contrast: Omnipaque 350  Oral contrast: None  Radiation Dose: Radiation dose reduction techniques were employed. CTDIvol: 17.9 mGy. DLP: 973 mGy-cm.    FINDINGS:     Lower thorax: Mild to moderate dependent changes are noted in the lower lobes posteriorly. Subtle mosaic attenuation of the basilar portion of the left lower lobe is nonspecific. There is partially imaged suspected mild cardiomegaly.    Liver: Mild diffuse hypoattenuation of the liver, likely fatty infiltration.     Gallbladder: Distended. No radiodense stones. No pericholecystic fluid.     Biliary: Minimal intrahepatic biliary ductal dilation. No CBD dilation.    Pancreas: Mild fatty replacement of the head and uncinate process of the pancreas.    Spleen: Unremarkable.    Adrenals: Tiny calcification in the left adrenal gland, may be related to remote hemorrhage.    Kidneys: Bilateral renal cysts including left parapelvic cysts. No hydronephrosis or hydroureter.     Stomach: The patient is status post previous gastric bypass surgery. There is a mild to moderate hiatal hernia. There is fluid and gaseous distention of the excluded stomach, which is new compared to the previous study and raises the possibility of a gastric-gastric fistula.    Bowel: There is a small bowel anastomosis which appears intact. The duodenum is decompressed. There is extensive small bowel dilation with mostly fluid-filled loops as well as air-fluid levels, which distally extends to the ileocecal valve with small bowel feces sign in the terminal ileum. The colon is decompressed. This appearance is consistent with obstruction. There is a short segment of nondistended ileum (series 2 images 131 through 138 where it there is circumferential wall thickening and mild wall enhancement. An underlying lesion in this location is difficult to exclude.     Appendix: Unremarkable.    Peritoneal cavity: Trace free fluid is noted in the lower abdomen adjacent to distended ileal  loop s. No fluid collection. No free air.     Bladder: Unremarkable.    Reproductive organs: Status post hysterectomy.    Vessels: Atherosclerosis.    Lymph nodes: No lymphadenopathy.    Abdominal wall: Postsurgical changes and clips along the anterior abdominal wall.    Bones: Generalized osteopenia. Intraosseous hemangiomas. Scattered mild degenerative changes.    IMPRESSION:    CT scan of the abdomen/pelvis reveals:  1.  Hiatal hernia.  2.  Status post previous gastric bypass surgery. Interval expansion of the excluded stomach containing fluid and gas, suspicious for gastric-gastric fistula.  3.  Small bowel obstruction, with transition point at the ileocecal valve.  4.  Focal wall thickening and nondistended ileum. Underlying lesion cannot be excluded.  5.  Mild hepatic steatosis.  6.  Other findings as above.    THIS REPORT CONTAINS FINDINGS THAT MAY BE CRITICAL TO PATIENT CARE.  The results were telephoned to Dr. Tylene Fantasia on 09/23/2020 9:05 AM.  The findings were acknowledged and understood.        Reviewed and Electronically Signed by: Ralene Bathe MD   Signed Date/Time: 09-23-2020 09:20:58             A/P: The patient is a 64 year old female w/ PMH of gastric bypass surgery and HTN, px w/ 3 day history of diffuse abdominal pain and 1 night of vomiting.  CT scan in ED shows a hiatal hernia, possible gastro-gastro fistula and SBO w/ transition point at the ileocecal valve.  Pt to be admitted for non-surgical decompression and monitoring.    #SBO  No current indication for emergent procedure.  Pt has stable  vitals and a relatively benign abdominal exam.  - NPO, no exceptions  - NG tube decompression  - IV fluid resuscitation  - IV antiemetics  - Pain control: IV morphine and tylenol    #Hiatal hernia  #Gastro-gastro fistula  Hiatal hernia previously noticed in 2013, no current symptoms reported that would indicate need to correct during this visit.  Gastro-gastro fistula can't be confirmed on CT  scan.  Could also be result of pt anatomy s/p roux-en-y gastric bypass.  - Continue to monitor    #HTN  - Continue home medications  --HCTZ 25mg  qD, losartan 50mg  BID      Marya Landry, MD  Pager #: [phone]

## 2020-09-23 NOTE — Narrator Note (Signed)
Report to [employer] on [place] 1. Patient will be going to room 100.

## 2020-09-23 NOTE — Narrator Note (Signed)
Nasogastric tube inserted, patient tolerated procedure well. Placement checked using right spot PH and tube connected to intermittent suction at 80. Awaiting chest x ray for verification.

## 2020-09-23 NOTE — Narrator Note (Signed)
Admission protocol initiated.

## 2020-09-24 LAB — BASIC METABOLIC PANEL
ANION GAP: 7 mmol/L (ref 5–15)
BUN (UREA NITROGEN): 10 mg/dL (ref 7–18)
CALCIUM: 8.6 mg/dL (ref 8.5–10.1)
CARBON DIOXIDE: 32 mmol/L (ref 21–32)
CHLORIDE: 102 mmol/L (ref 98–107)
CREATININE: 0.7 mg/dL (ref 0.4–1.2)
ESTIMATED GLOMERULAR FILT RATE: >60 mL/min (ref >60)
Glucose Random: 88 mg/dL (ref 74–160)
POTASSIUM: 3.9 mmol/L (ref 3.5–5.1)
SODIUM: 141 mmol/L (ref 136–145)

## 2020-09-24 LAB — CBC WITH PLATELET
ABSOLUTE NRBC COUNT: 0 10*3/uL (ref 0.0–0.0)
HEMATOCRIT: 37.1 % (ref 34.1–44.9)
HEMOGLOBIN: 11.7 g/dL (ref 11.2–15.7)
MEAN CORP HGB CONC: 31.5 g/dL (ref 31.0–37.0)
MEAN CORPUSCULAR HGB: 27.3 pg (ref 26.0–34.0)
MEAN CORPUSCULAR VOL: 86.5 fl (ref 80.0–100.0)
MEAN PLATELET VOLUME: 9.8 fL (ref 8.7–12.5)
NRBC %: 0 % (ref 0.0–0.0)
PLATELET COUNT: 268 10*3/uL (ref 150–400)
RBC DISTRIBUTION WIDTH STD DEV: 43.9 fL (ref 35.1–46.3)
RED BLOOD CELL COUNT: 4.29 M/uL (ref 3.90–5.20)
WHITE BLOOD CELL COUNT: 4.3 10*3/uL (ref 4.0–11.0)

## 2020-09-24 LAB — PHOSPHORUS: PHOSPHORUS: 3.4 mg/dL (ref 2.9–5.3)

## 2020-09-24 LAB — MAGNESIUM: MAGNESIUM: 1.9 mg/dL (ref 1.8–2.4)

## 2020-09-24 MED ORDER — ACETAMINOPHEN 325 MG PO TABS
975.0000 mg | ORAL_TABLET | Freq: Four times a day (QID) | ORAL | Status: DC | PRN
Start: 2020-09-24 — End: 2020-09-27
  Administered 2020-09-24 – 2020-09-25 (×5): 975 mg via ORAL
  Filled 2020-09-24 (×6): qty 3

## 2020-09-24 MED ORDER — LACTATED RINGERS IV SOLN
INTRAVENOUS | Status: AC
Start: 2020-09-24 — End: 2020-09-25

## 2020-09-24 MED ORDER — DIPHENHYDRAMINE HCL 50 MG/ML IJ SOLN
25.0000 mg | Freq: Every evening | INTRAMUSCULAR | Status: DC | PRN
Start: 2020-09-24 — End: 2020-09-27
  Administered 2020-09-24 – 2020-09-26 (×3): 25 mg via INTRAVENOUS
  Filled 2020-09-24 (×3): qty 1

## 2020-09-24 MED ORDER — FLEET ENEMA 7-19 GM/118ML PR ENEM
1.00 | ENEMA | Freq: Once | RECTAL | Status: AC
Start: 2020-09-24 — End: 2020-09-24
  Administered 2020-09-24: 1 via RECTAL
  Filled 2020-09-24: qty 1

## 2020-09-24 NOTE — Progress Notes (Signed)
Pt's case discussed at [facility]. Pt admitted with abdominal pain 2/2 SBO.  Met pt at bedside using interpreter. She lives with her husband and family. She is independent with both her ADL's and IADL's. She will be discharged home with outpt follow up when medically stable.  CM will remain available.

## 2020-09-24 NOTE — Progress Notes (Signed)
SURGERY ROUNDING PROGRESS NOTE:  HD#: 2  Admitted for: SBO    24hr Events:  NGT placed in the ER    [place] phone interpreter  Subjective:   Pt most bothered by NGT. Had a headache all night. Had some nausea overnight relieved with IV meds. Denies abdominal pain. No flatus or BM.     Objective:  Vitals:  Tmax: 99.3  BP 164/77    Pulse 74    Temp 99.6 F (37.6 C) (Oral)    Resp 18    Wt 88 kg (194 lb)    SpO2 94%    BMI 33.82 kg/m     IN:  IVF: 3160    OUT:  Urine: 1000  NGT: 100      Physical Exam:  Vital Signs: as above  General: Patient is well-developed, well-nourished, and in no apparent distress.   NGT in place with ~100cc light/clear drainage  Eyes: Conjunctivae pink, no jaundice  Head and Face: Face is symmetric  Neuro: Alert and oriented to time, place, and person.  Affect is appropriate.  Neck: trachea midline.    Lungs: No respiratory distress, no intercostal retractions, no use of accessory muscles. CTAB anteriorly - no wheezes, rales, rhonchi  CV: RRR  Abdomen: Soft, nondistended. Tender in LLQ and mid left abdomen with some involuntary guarding.  Skin: No visible corporeal dermatitis or mycosis.       LABS notable for:    Lab Results   Component Value Date    NA 141 09/24/2020    K 3.9 09/24/2020    CL 102 09/24/2020    CO2 32 09/24/2020    BUN 10 09/24/2020    CREAT 0.7 09/24/2020    GLUCOSER 88 09/24/2020    MG 1.9 09/24/2020    PHOS 3.4 09/24/2020    TBILI 0.6 09/23/2020    AST 28 09/23/2020    ALT 45 09/23/2020    ALKPHOS 192 (H) 09/23/2020     Lab Results   Component Value Date    WBC 4.3 09/24/2020    HGB 11.7 09/24/2020    HCT 37.1 09/24/2020    PLTA 268 09/24/2020       CT A/P 10/8:  CT scan of the abdomen/pelvis reveals:   1. Hiatal hernia.   2. Status post previous gastric bypass surgery. Interval expansion of the excluded stomach containing fluid and gas, suspicious for gastric-gastric fistula.   3. Small bowel obstruction, with transition point at the ileocecal valve.   4. Focal  wall thickening and nondistended ileum. Underlying lesion cannot be excluded.   5. Mild hepatic steatosis.   6. Other findings as above.        Assessment/Plan:  Patient is 64 year old, female, h/o HTN, GERD, c-sections x2, hysterectomy, Roux en Y 2017, abdominoplasty & panniculectomy 2019 admitted to the surgical service with SBO and transition point at the ileocecal valve currently being treated with conservative measures HD#2.    NPO / IVF  NGT - minimal output, will DC  Enema  PRN pain meds and anti-emetics  Continue home HTN meds  PPx: venodynes, Lovenox    Above plan d/w surgical attending, Dr. Mamie Levers, who is in agreement with above plan.    Zenaida Deed, PA-C  Pager #: [phone]

## 2020-09-24 NOTE — Plan of Care (Addendum)
Pt is a/o x3, calm and cooperative with staff. [place] interpretor used for communication. Assessment and vitals as documented. NGT removed at start of shift, denies abd pain. Complained of a headache PRN tylenol x2 given with good effect. Fleet enema administered with poor effect. LR infusing at 169ml/hr and remains NPO. Daughter came to visit. Rash not bothering pt, contact precautions maintained. Will continue to monitor.     Problem: Pain  Goal: Patient's pain/discomfort is manageable  Description: Assess and monitor patient's pain using appropriate pain scale. Collaborate with interdisciplinary team and initiate plan and interventions as ordered. Re-assess patient's pain level 30 - 60 minutes after pain management intervention.   Outcome: Progressing     Problem: Knowledge Deficit  Goal: Patient/S.O. demonstrates understanding of disease process, treatment plan, medications, and discharge instructions.  Description: Complete learning assessment and assess knowledge base  Outcome: Progressing     Problem: Insufficient Fluid Volume  Goal: Fluid and electrolyte balance are achieved/maintained  Description: Assess and monitor vital signs (orthostatic vitals if applicable), fluid intake and output, urine color, labs, skin turgor, mucous membranes, mental status, and gastrointestinal system for nausea, vomiting and diarrhea.  Monitor for signs and symptoms of hypovolemia (tachycardia, rapid breathing, decreased urine output, postural hypotension, confusion, syncope).  Collaborate with interdisciplinary team and initiate plan and interventions as ordered.  Outcome: Progressing     Problem: Inadequate Coping  Goal: Demonstrates ability to cope effectively  Description: Patient is able to verbalize feelings related to emotional state.  Outcome: Progressing

## 2020-09-24 NOTE — Plan of Care (Addendum)
Problem: Knowledge Deficit  Goal: Patient/S.O. demonstrates understanding of disease process, treatment plan, medications, and discharge instructions.  Description: Complete learning assessment and assess knowledge base  Outcome: Progressing     Problem: Pain  Goal: Patient's pain/discomfort is manageable  Description: Assess and monitor patient's pain using appropriate pain scale. Collaborate with interdisciplinary team and initiate plan and interventions as ordered. Re-assess patient's pain level 30 - 60 minutes after pain management intervention.   Outcome: Progressing  Patient is alert and oriented x3 in bed resting comfortably,no complaint of pain voiced,back of neck rash flat and reddened,contact precaution maintained.  0600 NGT to LWS,OOB to BR independently, voiding clear yellow urine,Cepacol given at 0552 for sore throat, call out surgical PA for Tylenol for Headache,awaiting return call from surgical PA, [place] interpreter called to facilitate communication.

## 2020-09-25 ENCOUNTER — Inpatient Hospital Stay (HOSPITAL_BASED_OUTPATIENT_CLINIC_OR_DEPARTMENT_OTHER): Payer: No Typology Code available for payment source

## 2020-09-25 DIAGNOSIS — K6389 Other specified diseases of intestine: Secondary | ICD-10-CM

## 2020-09-25 DIAGNOSIS — G8918 Other acute postprocedural pain: Secondary | ICD-10-CM

## 2020-09-25 DIAGNOSIS — K56609 Unspecified intestinal obstruction, unspecified as to partial versus complete obstruction: Secondary | ICD-10-CM | POA: Diagnosis not present

## 2020-09-25 LAB — PHOSPHORUS MAGNESIUM
MAGNESIUM: 1.8 mg/dL (ref 1.8–2.4)
PHOSPHORUS: 4.1 mg/dL (ref 2.9–5.3)

## 2020-09-25 LAB — BASIC METABOLIC PANEL
ANION GAP: 8 mmol/L (ref 5–15)
BUN (UREA NITROGEN): 9 mg/dL (ref 7–18)
CALCIUM: 8.6 mg/dL (ref 8.5–10.1)
CARBON DIOXIDE: 31 mmol/L (ref 21–32)
CHLORIDE: 103 mmol/L (ref 98–107)
CREATININE: 0.8 mg/dL (ref 0.4–1.2)
ESTIMATED GLOMERULAR FILT RATE: >60 mL/min (ref >60)
Glucose Random: 69 mg/dL — ABNORMAL LOW (ref 74–160)
POTASSIUM: 3.4 mmol/L — ABNORMAL LOW (ref 3.5–5.1)
SODIUM: 142 mmol/L (ref 136–145)

## 2020-09-25 MED ORDER — LACTATED RINGERS IV SOLN
INTRAVENOUS | Status: AC
Start: 2020-09-25 — End: 2020-09-26

## 2020-09-25 NOTE — Progress Notes (Signed)
Surgery Progress Note    HD#3 admitted with SBO    24 hour events: NGT d/c'd yesterday, remains NPO    Subjective: Had very small BM w/ some gas after suppository yesterday. No further flatus since. Has not ambulated in hallway since admission. Denies n/v, feeling very hungry. Endorses suprapubic abdominal pain only when pressed on.      Objective:  Vitals:  BP: (144-164)/(75-82)   Temp:  [97.9 F (36.6 C)-98.3 F (36.8 C)]   Pulse:  [55-69]   Resp:  [18]   SpO2:  [94 %-95 %]     Ins/Outs:  Last 24 Hours :  I/O 24 Hrs:  In: 2137.5 [I.V.:2137.5]  Out: -   UOP: not collected    Physical Exam:  VITALS: BP 161/79    Pulse 61    Temp 97.9 F (36.6 C)    Resp 18    Wt 88 kg (194 lb)    SpO2 95%    BMI 33.82 kg/m   CONSTITUTIONAL: Well-nourished, and in no apparent distress   HEENT: sclera anicteric, moist mucus membranes. Scattered raised papules to left neck w/ erythema surrounding. No vesicles or drainage noted.    SKIN:  Dry and warm  RESP: unlabored breathing on room air  GI: Abdomen soft, non-tender, non-distended. Hypoactive bowel sounds.   NEURO: A&Ox3, moving all four extremities spontaneously. Normal speech  PSYCH: Affect appropriate. Speech rate and thought content normal.      LABS:  Lab Results   Component Value Date    NA 141 09/24/2020    K 3.9 09/24/2020    CL 102 09/24/2020    CO2 32 09/24/2020    BUN 10 09/24/2020    CREAT 0.7 09/24/2020    GLUCOSER 88 09/24/2020    MG 1.9 09/24/2020    PHOS 3.4 09/24/2020    TBILI 0.6 09/23/2020    AST 28 09/23/2020    ALT 45 09/23/2020    ALKPHOS 192 (H) 09/23/2020     Lab Results   Component Value Date    WBC 4.3 09/24/2020    HGB 11.7 09/24/2020    HCT 37.1 09/24/2020    PLTA 268 09/24/2020     Cultures: none    Imaging:   KUB 10/9: on personal read, no dilated loops of bowel. No air fluid levels. Appears non-obstructive.       Assessment/Plan:  63yoF s/p RNYGBP 2017, csec, TAHBSO, panniculectomy, w/ hx of HTN admitted HD#3 with SBO treated conservatively w/ NGT,  d/c'd yesterday. Also w/ active shingles to neck, on valacyclovir.    - Ambulate patient today  - Await formal read of KUB, will likely advance to sips of clears  - Continue LR@ 125  - Continue valacyclovir 1g TID  - Ppx: lovenox, vendoynes    To be discussed with Dr. Ave Filter, PA-C   Pager #: [phone]

## 2020-09-25 NOTE — Plan of Care (Addendum)
Problem: Pain  Goal: Patient's pain/discomfort is manageable  Description: Assess and monitor patient's pain using appropriate pain scale. Collaborate with interdisciplinary team and initiate plan and interventions as ordered. Re-assess patient's pain level 30 - 60 minutes after pain management intervention.   Outcome: Progressing     Problem: Knowledge Deficit  Goal: Patient/S.O. demonstrates understanding of disease process, treatment plan, medications, and discharge instructions.  Description: Complete learning assessment and assess knowledge base  Outcome: Progressing     Problem: Insufficient Fluid Volume  Goal: Fluid and electrolyte balance are achieved/maintained  Description: Assess and monitor vital signs (orthostatic vitals if applicable), fluid intake and output, urine color, labs, skin turgor, mucous membranes, mental status, and gastrointestinal system for nausea, vomiting and diarrhea.  Monitor for signs and symptoms of hypovolemia (tachycardia, rapid breathing, decreased urine output, postural hypotension, confusion, syncope).  Collaborate with interdisciplinary team and initiate plan and interventions as ordered.  Outcome: Progressing     Problem: Inadequate Coping  Goal: Demonstrates ability to cope effectively  Description: Patient is able to verbalize feelings related to emotional state.  Outcome: Progressing   Pt A&O. IVF infusing. NPO maintained.   Few small ,non draining reddened spots on pt's left neck . Pain 5/10.   Ambulated down hallway w/o difficulty. States she is passing gas. States she is "very hungry". Surg PA notified.  1200 KUB done.  1430 Pt showered.  NPO maintained.

## 2020-09-25 NOTE — Plan of Care (Addendum)
Pt A&Ox3. [place] interpreter used [place] speaking. IVF infusing order. Remains NPO. Denies pain, N/V, SOB. Ambulated to the BR independently. Contact precaution maintained for shingles. PRN cepacol given for sore throat. Voiced no complaints this shift. Resting in bed comfortably. + flatus.Call light and personal items within reach. Needs met. Safety maintained.     2127: pt c/o neck pain - tylenol given with pending effect. BP remains elevated 165/85. PA made aware.     Problem: Pain  Goal: Patient's pain/discomfort is manageable  Description: Assess and monitor patient's pain using appropriate pain scale. Collaborate with interdisciplinary team and initiate plan and interventions as ordered. Re-assess patient's pain level 30 - 60 minutes after pain management intervention.   Outcome: Progressing     Problem: Knowledge Deficit  Goal: Patient/S.O. demonstrates understanding of disease process, treatment plan, medications, and discharge instructions.  Description: Complete learning assessment and assess knowledge base  Outcome: Progressing     Problem: Insufficient Fluid Volume  Goal: Fluid and electrolyte balance are achieved/maintained  Description: Assess and monitor vital signs (orthostatic vitals if applicable), fluid intake and output, urine color, labs, skin turgor, mucous membranes, mental status, and gastrointestinal system for nausea, vomiting and diarrhea.  Monitor for signs and symptoms of hypovolemia (tachycardia, rapid breathing, decreased urine output, postural hypotension, confusion, syncope).  Collaborate with interdisciplinary team and initiate plan and interventions as ordered.  Outcome: Progressing     Problem: Inadequate Coping  Goal: Demonstrates ability to cope effectively  Description: Patient is able to verbalize feelings related to emotional state.  Outcome: Progressing

## 2020-09-25 NOTE — Plan of Care (Addendum)
Pt is alert and oriented x4, [place] speaking, VSS, denied pain, N/V or dizziness. Continued NPO, LR running at 125 cc/ hr cont. Abd is soft, rounded and non tender, +BS. Hypoactive. voided x2 in the toilets, no BM today. Continued on contact precaution. Safety care maintained.      975 mg tylenol given for HA at night.    KUB done at 6:45 am and waiting for the result to be back.

## 2020-09-26 ENCOUNTER — Inpatient Hospital Stay (HOSPITAL_BASED_OUTPATIENT_CLINIC_OR_DEPARTMENT_OTHER): Payer: No Typology Code available for payment source | Admitting: Anesthesiology

## 2020-09-26 ENCOUNTER — Encounter (HOSPITAL_BASED_OUTPATIENT_CLINIC_OR_DEPARTMENT_OTHER): Payer: Self-pay | Admitting: Surgery

## 2020-09-26 ENCOUNTER — Encounter (HOSPITAL_BASED_OUTPATIENT_CLINIC_OR_DEPARTMENT_OTHER)
Admission: EM | Disposition: A | Discharge status: Home / Self Care | Payer: Self-pay | Source: Emergency Department | Attending: Surgery

## 2020-09-26 ENCOUNTER — Inpatient Hospital Stay (HOSPITAL_BASED_OUTPATIENT_CLINIC_OR_DEPARTMENT_OTHER): Payer: No Typology Code available for payment source

## 2020-09-26 DIAGNOSIS — K56609 Unspecified intestinal obstruction, unspecified as to partial versus complete obstruction: Secondary | ICD-10-CM | POA: Diagnosis not present

## 2020-09-26 LAB — BASIC METABOLIC PANEL
ANION GAP: 12 mmol/L (ref 5–15)
BUN (UREA NITROGEN): 8 mg/dL (ref 7–18)
CALCIUM: 8.9 mg/dL (ref 8.5–10.1)
CARBON DIOXIDE: 28 mmol/L (ref 21–32)
CHLORIDE: 103 mmol/L (ref 98–107)
CREATININE: 0.7 mg/dL (ref 0.4–1.2)
ESTIMATED GLOMERULAR FILT RATE: >60 mL/min (ref >60)
Glucose Random: 83 mg/dL (ref 74–160)
POTASSIUM: 3.7 mmol/L (ref 3.5–5.1)
SODIUM: 143 mmol/L (ref 136–145)

## 2020-09-26 LAB — CBC WITH PLATELET
ABSOLUTE NRBC COUNT: 0 10*3/uL (ref 0.0–0.0)
HEMATOCRIT: 34.9 % (ref 34.1–44.9)
HEMOGLOBIN: 11.2 g/dL (ref 11.2–15.7)
MEAN CORP HGB CONC: 32.1 g/dL (ref 31.0–37.0)
MEAN CORPUSCULAR HGB: 27.7 pg (ref 26.0–34.0)
MEAN CORPUSCULAR VOL: 86.2 fl (ref 80.0–100.0)
MEAN PLATELET VOLUME: 10.1 fL (ref 8.7–12.5)
NRBC %: 0 % (ref 0.0–0.0)
PLATELET COUNT: 230 10*3/uL (ref 150–400)
RBC DISTRIBUTION WIDTH STD DEV: 41 fL (ref 35.1–46.3)
RED BLOOD CELL COUNT: 4.05 M/uL (ref 3.90–5.20)
WHITE BLOOD CELL COUNT: 3.2 10*3/uL — ABNORMAL LOW (ref 4.0–11.0)

## 2020-09-26 LAB — PHOSPHORUS MAGNESIUM
MAGNESIUM: 1.8 mg/dL (ref 1.8–2.4)
PHOSPHORUS: 4.4 mg/dL (ref 2.9–5.3)

## 2020-09-26 LAB — COVID-19 SURVEILLANCE: COVID-19 INPATIENT SURVEILLANCE: NEGATIVE

## 2020-09-26 SURGERY — LAPAROSCOPY, DIAGNOSTIC
Anesthesia: General | Site: Abdomen

## 2020-09-26 MED ORDER — GLYCOPYRROLATE 1 MG/5ML IV SOSY (SUPER ERX)
PREFILLED_SYRINGE | Status: AC
Start: 2020-09-26 — End: 2020-09-26
  Filled 2020-09-26: qty 5

## 2020-09-26 MED ORDER — LACTATED RINGERS IV SOLN
INTRAVENOUS | Status: DC
Start: 2020-09-26 — End: 2020-09-27

## 2020-09-26 MED ORDER — MORPHINE SULFATE 2 MG/ML IV SOLN (SUPER ERX)
2.0000 mg | Status: DC | PRN
Start: 2020-09-26 — End: 2020-09-27
  Administered 2020-09-26 – 2020-09-27 (×3): 2 mg via INTRAVENOUS
  Filled 2020-09-26 (×3): qty 1

## 2020-09-26 MED ORDER — LIDOCAINE HCL (PF) 2 % IJ SOLN
Freq: Once | INTRAMUSCULAR | Status: DC | PRN
Start: 2020-09-26 — End: 2020-09-29
  Administered 2020-09-26: 2 mL via INTRAVENOUS

## 2020-09-26 MED ORDER — FENTANYL CITRATE 0.05 MG/ML IJ SOLN
INTRAMUSCULAR | Status: AC
Start: 2020-09-26 — End: 2020-09-26
  Filled 2020-09-26: qty 2

## 2020-09-26 MED ORDER — GLYCOPYRROLATE 0.2 MG/ML IJ SOLN
Freq: Once | INTRAMUSCULAR | Status: DC | PRN
Start: 2020-09-26 — End: 2020-09-29
  Administered 2020-09-26: .8 mg via INTRAVENOUS

## 2020-09-26 MED ORDER — LIDOCAINE-EPINEPHRINE 1 %-1:200000 IJ SOLN
INTRAMUSCULAR | Status: AC
Start: 2020-09-26 — End: 2020-09-26
  Administered 2020-09-26: 12 mL via SUBCUTANEOUS
  Filled 2020-09-26: qty 30

## 2020-09-26 MED ORDER — LABETALOL HCL 5 MG/ML IV SOLN
INTRAVENOUS | Status: AC
Start: 2020-09-26 — End: 2020-09-26
  Filled 2020-09-26: qty 20

## 2020-09-26 MED ORDER — ROCURONIUM BROMIDE 100 MG/10ML IV SOLN
INTRAVENOUS | Status: AC
Start: 2020-09-26 — End: 2020-09-26
  Filled 2020-09-26: qty 10

## 2020-09-26 MED ORDER — MIDAZOLAM HCL 2 MG/2 ML IJ SOLN
Freq: Once | INTRAMUSCULAR | Status: DC | PRN
Start: 2020-09-26 — End: 2020-09-29
  Administered 2020-09-26: 2 mg via INTRAVENOUS

## 2020-09-26 MED ORDER — MORPHINE SULFATE 4 MG/ML IV SOLN (SUPER ERX)
4.0000 mg | Status: DC | PRN
Start: 2020-09-26 — End: 2020-09-27
  Administered 2020-09-26: 4 mg via INTRAVENOUS
  Filled 2020-09-26: qty 1

## 2020-09-26 MED ORDER — LACTATED RINGERS IV BOLUS
INTRAVENOUS | Status: DC | PRN
Start: 2020-09-26 — End: 2020-09-29

## 2020-09-26 MED ORDER — ONDANSETRON HCL 4 MG/2ML IJ SOLN
Freq: Once | INTRAMUSCULAR | Status: DC | PRN
Start: 2020-09-26 — End: 2020-09-29
  Administered 2020-09-26: 4 mg via INTRAVENOUS

## 2020-09-26 MED ORDER — MEPERIDINE HCL 50 MG/ML IJ SOLN
12.5000 mg | INTRAMUSCULAR | Status: DC | PRN
Start: 2020-09-26 — End: 2020-09-26

## 2020-09-26 MED ORDER — ROCURONIUM BROMIDE 100 MG/10ML IV SOLN
Freq: Once | INTRAVENOUS | Status: DC | PRN
Start: 2020-09-26 — End: 2020-09-29
  Administered 2020-09-26: 30 mg via INTRAVENOUS
  Administered 2020-09-26: 10 mg via INTRAVENOUS

## 2020-09-26 MED ORDER — KETOROLAC TROMETHAMINE 30 MG/ML INJ
Status: AC
Start: 2020-09-26 — End: 2020-09-26
  Filled 2020-09-26: qty 1

## 2020-09-26 MED ORDER — CEFAZOLIN IN SODIUM CHLORIDE 3-0.9 GM/100ML-% IV SOLN
3.00 g | Freq: Once | INTRAVENOUS | Status: AC
Start: 2020-09-26 — End: 2020-09-26
  Administered 2020-09-26: 3 g via INTRAVENOUS
  Filled 2020-09-26: qty 100

## 2020-09-26 MED ORDER — LIDOCAINE HCL (PF) 2 % IJ SOLN
INTRAMUSCULAR | Status: AC
Start: 2020-09-26 — End: 2020-09-26
  Filled 2020-09-26: qty 2

## 2020-09-26 MED ORDER — FENTANYL CITRATE 0.05 MG/ML IJ SOLN
Freq: Once | INTRAMUSCULAR | Status: DC | PRN
Start: 2020-09-26 — End: 2020-09-29
  Administered 2020-09-26: 100 ug via INTRAVENOUS

## 2020-09-26 MED ORDER — FENTANYL CITRATE 0.05 MG/ML IJ SOLN
25.0000 ug | INTRAMUSCULAR | Status: DC | PRN
Start: 2020-09-26 — End: 2020-09-26

## 2020-09-26 MED ORDER — LACTATED RINGERS IV SOLN
INTRAVENOUS | Status: DC
Start: 2020-09-26 — End: 2020-09-26

## 2020-09-26 MED ORDER — ONDANSETRON HCL 4 MG/2ML IJ SOLN
4.0000 mg | Freq: Once | INTRAMUSCULAR | Status: DC | PRN
Start: 2020-09-26 — End: 2020-09-26

## 2020-09-26 MED ORDER — NEOSTIGMINE METHYLSULFATE 10 MG/10ML IV SOLN
Freq: Once | INTRAVENOUS | Status: DC | PRN
Start: 2020-09-26 — End: 2020-09-29
  Administered 2020-09-26: 4 mg via INTRAVENOUS

## 2020-09-26 MED ORDER — PROPOFOL 200 MG/20ML IV EMUL
INTRAVENOUS | Status: AC
Start: 2020-09-26 — End: 2020-09-26
  Filled 2020-09-26: qty 20

## 2020-09-26 MED ORDER — KETOROLAC TROMETHAMINE 30 MG/ML INJ
Freq: Once | Status: DC | PRN
Start: 2020-09-26 — End: 2020-09-29
  Administered 2020-09-26: 30 mg via INTRAVENOUS

## 2020-09-26 MED ORDER — DOCUSATE SODIUM 100 MG PO CAPS
100.0000 mg | ORAL_CAPSULE | Freq: Two times a day (BID) | ORAL | Status: DC
Start: 2020-09-26 — End: 2020-09-27
  Administered 2020-09-26 – 2020-09-27 (×2): 100 mg via ORAL
  Filled 2020-09-26 (×2): qty 1

## 2020-09-26 MED ORDER — LABETALOL HCL 5 MG/ML IV SOLN
Freq: Once | INTRAVENOUS | Status: DC | PRN
Start: 2020-09-26 — End: 2020-09-29
  Administered 2020-09-26: 10 mg via INTRAVENOUS

## 2020-09-26 MED ORDER — BUPIVACAINE HCL (PF) 0.25 % IJ SOLN
INTRAMUSCULAR | Status: AC
Start: 2020-09-26 — End: 2020-09-26
  Administered 2020-09-26: 60 mL
  Filled 2020-09-26: qty 60

## 2020-09-26 MED ORDER — MIDAZOLAM HCL 2 MG/2 ML IJ SOLN
INTRAMUSCULAR | Status: AC
Start: 2020-09-26 — End: 2020-09-26
  Filled 2020-09-26: qty 2

## 2020-09-26 MED ORDER — ONDANSETRON HCL 4 MG/2ML IJ SOLN
INTRAMUSCULAR | Status: AC
Start: 2020-09-26 — End: 2020-09-26
  Filled 2020-09-26: qty 2

## 2020-09-26 MED ORDER — SUCCINYLCHOLINE CHLORIDE 20 MG/ML IJ SOLN
Freq: Once | INTRAMUSCULAR | Status: DC | PRN
Start: 2020-09-26 — End: 2020-09-29
  Administered 2020-09-26: 100 mg via INTRAVENOUS

## 2020-09-26 MED ORDER — HYDROCHLOROTHIAZIDE 25 MG PO TABS
50.0000 mg | ORAL_TABLET | Freq: Every day | ORAL | Status: DC
Start: 2020-09-26 — End: 2020-09-27
  Administered 2020-09-26 – 2020-09-27 (×2): 50 mg via ORAL
  Filled 2020-09-26 (×2): qty 2

## 2020-09-26 MED ORDER — PROPOFOL 200 MG/20 ML IV - AN
Freq: Once | INTRAVENOUS | Status: DC | PRN
Start: 2020-09-26 — End: 2020-09-29
  Administered 2020-09-26: 200 mg via INTRAVENOUS

## 2020-09-26 MED ORDER — SUCCINYLCHOLINE CHLORIDE 200 MG/10ML IV SOSY
PREFILLED_SYRINGE | INTRAVENOUS | Status: AC
Start: 2020-09-26 — End: 2020-09-26
  Filled 2020-09-26: qty 10

## 2020-09-26 MED ORDER — NEOSTIGMINE METHYLSULFATE 10 MG/10ML IV SOLN
INTRAVENOUS | Status: AC
Start: 2020-09-26 — End: 2020-09-26
  Filled 2020-09-26: qty 10

## 2020-09-26 SURGICAL SUPPLY — 14 items
.9% NACL IRRIGATION 500ML (SOLUTION) ×2 IMPLANT
ANES BREATHING CIRCUIT (ANES) ×2 IMPLANT
BIOSYN SUTURE 4-0 P12 30 (SUTURE) ×2
LACTATED RINGER IV 1000 L7500 (SOLUTION) ×2 IMPLANT
PACK LAPAROSCOPY GEN SURG (PACK) ×2 IMPLANT
PASSER SUTURE DISP (SURGCUT) ×2 IMPLANT
POLYSORB 3-0 V-12 NDL UNDYED (SUTURE) ×2
SOFSILK SUTURE 2-0 V20 5X18 (SUTURE) ×1
SOFSILK SUTURE 3-0 V20 5X18 (SUTURE) ×1
SPONGE LAP,XRAY,RFID (SURGSPG) ×6 IMPLANT
SUTURE BIOSYN 4-0 P12 30IN (SUTURE) ×2 IMPLANT
SUTURE POLYSORB 3-0 V20 30IN (SUTURE) ×2 IMPLANT
SUTURE SOFSILK 2-0 V20 5X18IN (SUTURE) ×1 IMPLANT
SUTURE SOFSILK 3-0 V20 5X18IN (SUTURE) ×1 IMPLANT

## 2020-09-26 NOTE — Surgery Post-Op (Signed)
Surgery Progress Note    Post-op day #: 0    S/P: Diagnostic laparoscopy, laparoscopic lysis of adhesions, tap block    Reason for Hospitalization: SBO    Subjective: using [place] interpreter, patient feels well. She has no complaints of pain or nausea. She is ambulating to the bathroom, reports voiding "only a little."  She is tolerating juice and gingerale. She has not passed flatus since getting out of the OR.    Objective:  Vitals:  BP 161/78    Pulse 66    Temp 98.6 F (37 C) (Oral)    Resp 16    Ht 5' 3.5" (1.613 m)    Wt 88 kg (194 lb)    SpO2 97%    BMI 33.83 kg/m   Temp:  [97 F (36.1 C)-98.6 F (37 C)] 98.6 F (37 C)  Pulse:  [51-84] 66  Resp:  [7-18] 16  BP: (145-183)/(68-85) 161/78    Ins/Outs:  I/O 24 Hrs:  In: 2000 [I.V.:1350]  Out: 600 [Urine:600]  I/O this shift:  In: -   Out: 600 [Urine:600]    Physical Exam:  GEN: NAD, pleasant  CV: S1, S2  PULM: CTA B  ABD: Soft, ND. Very minimal tenderness at incisions with palpation. 4 dressings in place. One stained with serosang old drainage  MS:  venodynes in place    LABS:  Lab Results   Component Value Date    NA 143 09/26/2020    K 3.7 09/26/2020    CL 103 09/26/2020    CO2 28 09/26/2020    BUN 8 09/26/2020    CREAT 0.7 09/26/2020    GLUCOSER 83 09/26/2020    MG 1.8 09/26/2020    PHOS 4.4 09/26/2020    TBILI 0.6 09/23/2020    AST 28 09/23/2020    ALT 45 09/23/2020    ALKPHOS 192 (H) 09/23/2020     Lab Results   Component Value Date    WBC 3.2 (L) 09/26/2020    HGB 11.2 09/26/2020    HCT 34.9 09/26/2020    PLTA 230 09/26/2020           Assessment/Plan:  Patient is 64 year old, female, admitted for SBO, failed conservative measures and was brought to the OR today for a Diagnostic laparoscopy, laparoscopic lysis of adhesions, tap block.  POD#0     1) Clears   2) Pain control PRN   3) Ambulate, IS use, Lovenox, venodynes   4) Likely home in AM on Clear Liquids with appropriate Surgical Follow-Up      Corinna Capra, PA-C  Pager #: [phone]

## 2020-09-26 NOTE — Progress Notes (Signed)
Ongoing follow up for planning. Patient to OR today. Needs assessment will be ongoing post op.

## 2020-09-26 NOTE — Plan of Care (Signed)
A&Ox3. Calm and cooperative w/ care. Denies any pain/SOB. No acute distress noted. NPO status maintained. OOB independently in room to BR. Tolerates well. IVF infusing 125cc/hr per MD order. Pt resting comfortably in bed at this time.    Bed in lowest position. Call light within reach. Needs met.     Problem: Pain  Goal: Patient's pain/discomfort is manageable  Description: Assess and monitor patient's pain using appropriate pain scale. Collaborate with interdisciplinary team and initiate plan and interventions as ordered. Re-assess patient's pain level 30 - 60 minutes after pain management intervention.   Outcome: Progressing     Problem: Knowledge Deficit  Goal: Patient/S.O. demonstrates understanding of disease process, treatment plan, medications, and discharge instructions.  Description: Complete learning assessment and assess knowledge base  Outcome: Progressing     Problem: Insufficient Fluid Volume  Goal: Fluid and electrolyte balance are achieved/maintained  Description: Assess and monitor vital signs (orthostatic vitals if applicable), fluid intake and output, urine color, labs, skin turgor, mucous membranes, mental status, and gastrointestinal system for nausea, vomiting and diarrhea.  Monitor for signs and symptoms of hypovolemia (tachycardia, rapid breathing, decreased urine output, postural hypotension, confusion, syncope).  Collaborate with interdisciplinary team and initiate plan and interventions as ordered.  Outcome: Progressing     Problem: Inadequate Coping  Goal: Demonstrates ability to cope effectively  Description: Patient is able to verbalize feelings related to emotional state.  Outcome: Progressing

## 2020-09-26 NOTE — Plan of Care (Addendum)
Pt A&Ox3. OOB to BR independently. Voiding without issues. Tolerating clear liquid diet - denies N/V. Dressings on abdomen. x3 C/D/I. x1 with old drainage. Denies pain, SOB. Remains on contact precaution for shingles on neck area. Call light and personal items within reach. Needs met. Safety maintained.     2055: pt endorsed 5/10 pain. PRN morphine given with good effect.       Problem: Pain  Goal: Patient's pain/discomfort is manageable  Description: Assess and monitor patient's pain using appropriate pain scale. Collaborate with interdisciplinary team and initiate plan and interventions as ordered. Re-assess patient's pain level 30 - 60 minutes after pain management intervention.   09/26/2020 2032 by Demetra Shiner, RN  Outcome: Progressing  09/26/2020 2032 by Demetra Shiner, RN  Outcome: Progressing     Problem: Knowledge Deficit  Goal: Patient/S.O. demonstrates understanding of disease process, treatment plan, medications, and discharge instructions.  Description: Complete learning assessment and assess knowledge base  09/26/2020 2032 by Demetra Shiner, RN  Outcome: Progressing  09/26/2020 2032 by Demetra Shiner, RN  Outcome: Progressing     Problem: Insufficient Fluid Volume  Goal: Fluid and electrolyte balance are achieved/maintained  Description: Assess and monitor vital signs (orthostatic vitals if applicable), fluid intake and output, urine color, labs, skin turgor, mucous membranes, mental status, and gastrointestinal system for nausea, vomiting and diarrhea.  Monitor for signs and symptoms of hypovolemia (tachycardia, rapid breathing, decreased urine output, postural hypotension, confusion, syncope).  Collaborate with interdisciplinary team and initiate plan and interventions as ordered.  09/26/2020 2032 by Demetra Shiner, RN  Outcome: Progressing  09/26/2020 2032 by Demetra Shiner, RN  Outcome: Progressing     Problem: Inadequate Coping  Goal: Demonstrates ability to cope effectively  Description: Patient is able to verbalize  feelings related to emotional state.  09/26/2020 2032 by Demetra Shiner, RN  Outcome: Progressing  09/26/2020 2032 by Demetra Shiner, RN  Outcome: Progressing

## 2020-09-26 NOTE — Progress Notes (Addendum)
Surgery Progress Note  HD#4 admitted with SBO       Subjective: Minimal flatus, periumbilical abdominal pain only when palpated. Denies nausea/ vomiting.     Objective:  Vitals:  BP: (145-183)/(75-85)   Temp:  [98 F (36.7 C)-98.2 F (36.8 C)]   Pulse:  [55-71]   Resp:  [16-18]   SpO2:  [94 %-98 %]     Ins/Outs:  Last 24 Hours :  I/O 24 Hrs:  In: 1050 [P.O.:50; I.V.:1000]  Out: -     Physical Exam:  VITALS: BP 183/81    Pulse 62    Temp 98.1 F (36.7 C) (Oral)    Resp 18    Wt 88 kg (194 lb)    SpO2 96%    BMI 33.82 kg/m   CONSTITUTIONAL: Well-nourished, and in no apparent distress   HEENT: sclera anicteric, moist mucus membranes  SKIN:  Dry and warm  CV: S1/S2 Regular rate and rhythm, no murmurs  RESP: chest clear to auscultation b/l no wheeze or crackles  GI: Abdomen soft, non-tender, non-distended. No bowel sounds present  Lower Ext- No edema. No rashes.  No skin ulcers.  NEURO: A&Ox3, moving all four extremities spontaneously. Normal speech  PSYCH: Affect appropriate. Speech rate and thought content normal.      LABS:  Lab Results   Component Value Date    NA 143 09/26/2020    K 3.7 09/26/2020    CL 103 09/26/2020    CO2 28 09/26/2020    BUN 8 09/26/2020    CREAT 0.7 09/26/2020    GLUCOSER 83 09/26/2020    MG 1.8 09/26/2020    PHOS 4.4 09/26/2020    TBILI 0.6 09/23/2020    AST 28 09/23/2020    ALT 45 09/23/2020    ALKPHOS 192 (H) 09/23/2020     Lab Results   Component Value Date    WBC 3.2 (L) 09/26/2020    HGB 11.2 09/26/2020    HCT 34.9 09/26/2020    PLTA 230 09/26/2020     Cultures: none    Imaging:   10/10 KUB    Bowel Gas Pattern: There is a non-obstructed bowel gas pattern. There is gas in the sigmoid colon. There are surgical clips throughout the abdomen, particularly on the left.     Free Air: None     Calcifications: There are no abnormal calcifications.     Bones: There are degenerative changes of the lumbar spine.     Impression:     Nonobstructed bowel gas pattern.         Assessment/Plan:  63yoF  w/ extensive abdominal surgical history including RNYGBP, abdominoplasty, admitted HD#4 w/ SBO without significant improvement with conservative measures.   - OR today for laparoscopic lysis of adhesions, small bowel resection  - 3g ancef, venodynes on call to OR  - Consents to be signed with Dr. Ave Filter, PA-C   Pager #: [phone]

## 2020-09-26 NOTE — Plan of Care (Addendum)
Problem: Pain  Goal: Patient's pain/discomfort is manageable  Description: Assess and monitor patient's pain using appropriate pain scale. Collaborate with interdisciplinary team and initiate plan and interventions as ordered. Re-assess patient's pain level 30 - 60 minutes after pain management intervention.   Outcome: Progressing     Problem: Knowledge Deficit  Goal: Patient/S.O. demonstrates understanding of disease process, treatment plan, medications, and discharge instructions.  Description: Complete learning assessment and assess knowledge base  Outcome: Progressing     Problem: Insufficient Fluid Volume  Goal: Fluid and electrolyte balance are achieved/maintained  Description: Assess and monitor vital signs (orthostatic vitals if applicable), fluid intake and output, urine color, labs, skin turgor, mucous membranes, mental status, and gastrointestinal system for nausea, vomiting and diarrhea.  Monitor for signs and symptoms of hypovolemia (tachycardia, rapid breathing, decreased urine output, postural hypotension, confusion, syncope).  Collaborate with interdisciplinary team and initiate plan and interventions as ordered.  Outcome: Progressing     Problem: Inadequate Coping  Goal: Demonstrates ability to cope effectively  Description: Patient is able to verbalize feelings related to emotional state.  Outcome: Progressing   Pt A&O. IVF infusing. NPO maintained.   1030 transferred to OR.  1345 returned from PACU, awake , alert. Used BR, void large amt. (no measuring device in toilet).   Med for pain. Sleeping afterwards. IVF infusing.

## 2020-09-26 NOTE — Op Note (Signed)
Date of surgery 09/26/2020    Preop diagnosis: Closed-loop small bowel obstruction after gastric bypass     postop diagnosis: Same  Procedure: Diagnostic laparoscopy, laparoscopic lysis of adhesions, tap block      Surgeon Dr. Loa Socks     first Assistant Guadalupe Dawn PA        Operative indication: Patient is a laparoscopic Roux-en-Y gastric bypass in 2017.  She lost some weight from bending gained about 30 to 40 pounds since.  Patient came in the hospital at small bowel obstruction.  There is multiple loops of bowel dilated both in the biliary limb as well as alimentary limb.    Next spoke to the patient yesterday patient was feeling like things were getting better so we gave an extra day.  Patient has not gotten better.    Operative procedure in detail:    Patient brought to the operating room  underwent briefing process area prepped and draped in sterile fashion.,    Optiview technique was used to enter the peritoneal cavity in the left upper quadrant using a 5 mm scope.    Subsequent to 10 and two 5 mm trochars were placed.  We started running the bowel from the cecum ileocecal junction.  Approximately.  There is no ileal obstruction that we noted small scar tissue.  As we reduce the bowel we noticed that the decompressed bowel was being filled with fluid that was backed up.  After identifying the jejunojejunal anastomosis we will look for any mesenteric defect that was done there was a alimentary limb going retrocolic retrogastric to deliver gastrojejunal anastomosis.  NG tube was removed the gastrojejunal anastomosis with was reviewed.  It continued opening of the bowel proximally to the Roux limb and the other 1 biliopancreatic limb.  No other abnormalities noted no internal hernias were noted other than the adhesions did right lower quadrant.     We ran the bowel again from stomach to the terminal ileum that was negative but the loop of bowel away from the offending area.  Direct vision 12 mm trochars  were closed with transabdominal sutures.  Pneumoperitoneum was released skin was closed with 4-0 Monocryl.  I was present for entire surgery.  EBL minimal.

## 2020-09-26 NOTE — Surgery Pre-Op (Signed)
Mariah [name] is a 64 year old female hasnt gotten better. Concerned for closed loop obstruction with gastric bypass      Procedure(s):  Lysis of adhesions, small bowel resection  09/23/2020 - 09/26/2020        .     Language of care:Portuguese    Allergy History  Heparin and Lisinopril      Past Medical History:  03/27/2006: ABN FIND-STOOL CONTENTS-OCC BLOOD      Comment:  s/p colonoscopy and EGD 7/07  06/04/2006: ABSENCE OF MENSTRUATION - s/p TAH BSO  08/05/2004: ANXIETY DEPRESSION      Comment:  TSH wnl 8/02; trial of fluoxetine 8/05  08/05/2004: BACK PAIN LOW      Comment:  LBP musculoskeletal most likely 1/04  04/23/2006: Cough      Comment:  s/p PFTs September 2007 --  some obstruction at mid to                low lung volumes not correctable by bronchodilator CT                scan showed stable pulmonary nodules over several years                11/07 - cough gone ** note combined with dx of "nodules                on CT scan". Will file this note to hx.     08/05/2004: depressive disorder      Comment:  TSH wnl 8/02; trial of fluoxetine x 6 wks didn't really                help 8/05  trial of citalopram per Dr. Patrice Paradise. later                resolved. 04/15/2014 - Reassessed, pt reports feeling                well, with no sx of depression. Just reports being tired,               which she attributes to current life style. Will resolve                from problem list.    03/27/2006: Diarrhea      Comment:  7/07 The colonoscopy was visually normal and biopsies                taken to rule out microscopic colitis were negative.  A                biopsy of the descending duodenum to rule out celiac                sprue because of the diarrhea was negative.    12/19/2012: Gastritis      Comment:  Combined with entry "esophageal reflux    10/22/2006: Hyperparathyroidism      Comment:  osteoporosis found - pt referred to surgery for the                hyperparathyroidism- s/p parathyroidectomy feb  2008  06/18/2012: Inconclusive mammogram      Comment:  June 2013 - BIRADS 3. Probable benign mammogram.                Recommend 6 month  follow-up mammogram (Dec 2013).                 Dec/13 - Stable left breast nodule  with no evidence of                malignancy. BIRADS 2 benign recommend annual screening                mammogram in 6 months  (June 2014) June/14 - mammogram                request given to pt, she will call to schedule:  Jan/15 -               normal mammogram, yearly screening. Resolve from problem                list.        03/13/2006: Insomnia, unspecified      Comment:  06/25/2012 - intermittent, usually resolved by taking                Tylenol PM 06/09/2014 - good response to trazodone, takes                one to 4x wk, prn 08/24/2016 - reports no problems sleeping               - file to hx, and resolve from problem list.   03/27/2006: nodules on chest CT 1/06 and 3/07      Comment:  s/p PFTs September 2007 --  some obstruction at mid to                low lung volumes not correctable by bronchodilator CT                scan showed stable pulmonary nodules over several years                11/07 - cough gone  04/29/12 - saw pneumologist, Dr Jordan Hawks,               "Will repeat chest CT with high-resolution scans at the 2               month mark (already ordered). If abnormal, would do PFT's               and further rheum w/u, consider lung bx . Pt to call if                any r  08/05/2004: OBESITY NOS      Comment:  has tried meds from [place]  08/05/2004: PERS HX OF PAST NONCOMPLIANCE      Comment:  poor compliance to med regimen  08/05/2004: PERS HX TOBACCO USE      Comment:  quit 10/02; 1/2 PPD since 64 YO  08/05/2004: Screening for hypertension      Comment:  occ elevated BP started on anti-HTN in [place]; chem 7,                EKG, HCT u/a wnl 4/03; d/c'ed HCTZ 8/05 when we learned                from daughter she has not been taking it  05/19/2008: Vitamin D deficiency      Comment:   Supplement per dr Jake Samples.     has a past surgical history that includes TAH +-RMVL TUBE +-RMVL OVARY (5/00); SALPINGO-OOPHORECTOMY COMPL/PRTL UNI/BI SPX (5/00); Parathyroidectomy (2006); TONSILLECTOMY 1/2 UNDER AGE 55; and Bariatric Surgery (04/2016).         Alcohol, Tobacco and Drug History:  Social History    Tobacco Use  Smoking status: Former Smoker        Packs/day: 0.50        Years: 40.00        Pack years: 20        Types: Cigarettes        Quit date: 03/31/2012        Years since quitting: 8.4      Smokeless tobacco: Former Systems developer      Tobacco comment: 3-4 cigarretes a day. Quit about 1 month ago    E-Cigarette/Vaping  E-Cigarette Use:   Quit Date:   Nicotine:   Start Date:   THC:    Cartridges/Day:   CBD:   Other Substance:   Flavoring:       Alcohol use Yes   Comment: very occasional       Drug use: No       Extended Emergency Contact Information  Primary Emergency Contact: [facility]  Mobile Phone: [phone]  Relation: Husband  Preferred language: [place]  Interpreter needed? Yes  Secondary Emergency Contact: GONCALVES,GRAZIELLE  Address: [address]           [place], [place] [address] [place] Phone: [phone]  Relation: Daughter        Current Facility-Administered Medications   Medication    hydrochlorothiazide (HYDRODIURIL) tablet 50 mg    bupivacaine PF (MARCAINE PF) 0.25 % injection    lidocaine 1%-EPINEPHrine 1:200,000 1 %-1:200000 injection    lactated ringers infusion    acetaminophen (TYLENOL) tablet 975 mg    diphenhydrAMINE (BENADRYL) injection 25 mg    irbesartan (AVAPRO) tablet 300 mg    ondansetron (ZOFRAN) injection 4 mg    enoxaparin (LOVENOX) injection 40 mg    valACYclovir (VALTREX) tablet 1,000 mg    benzocaine-menthol (CEPACOL) lozenge 3.6 mg         valACYclovir (VALTREX) 500 MG tablet, Take 2 tablets by mouth 3 (three) times daily  for 7 days, Disp: 42 tablet, Rfl: 0  escitalopram (LEXAPRO) 20 MG tablet, Take 1 tablet by  mouth daily, Disp: 30 tablet, Rfl: 2  losartan (COZAAR) 50 MG tablet, Take 2 tablets by mouth daily, Disp: 180 tablet, Rfl: 3  omeprazole (PRILOSEC) 20 MG capsule, TAKE 1 CAPSULE BY MOUTH DAILY, Disp: 30 capsule, Rfl: 10  cholecalciferol (VITAMIN D3) 1000 UNIT tablet, Take 1 tablet by mouth daily, Disp: 30 tablet, Rfl: 11  hydrochlorothiazide (HYDRODIURIL) 25 MG tablet, Take 1 tablet by mouth daily, Disp: 90 tablet, Rfl: 3                 09/25/20  2311 09/26/20  0442 09/26/20  0745 09/26/20  0907   BP: 145/75 168/83 145/76 183/81   Pulse: 71 55 62    Resp: 18 16 18     Temp: 98 F (36.7 C)  98.1 F (36.7 C)    TempSrc: Oral  Oral    SpO2: 94% 94% 96%    Weight:                Patient appears well, alert and oriented x 3, pleasant, cooperative.    Vitals are as noted.   Neck supple and free of adenopathy, or masses. No thyromegaly.   Eyes: EOMI.   Lungs are clear to auscultation.   Heart sounds are normal, no murmurs, clicks, gallops or rubs.   Abdomen is soft, no tenderness, masses or organomegaly. Mild distension diffuse discomfort   Extremities are normal. Peripheral pulses are  normal.   Screening neurological exam is normal without focal findings.   Skin is normal without suspicious lesions noted.      IMP: SBO unresolved    Plan: dxstic lap possible open surgery with bowel resection.    We discussed procedure related complications including but not limited to recurrence of, wound infection, numbness, severe bleeding, injury to adjacent organs, possible re operations for emergencies and chronic issues, transfusion risks and mortality. Pt understands alternatives including doing nothing.    Florinda Marker, MD

## 2020-09-26 NOTE — Anesthesia Preprocedure Evaluation (Signed)
Pre-Anesthetic Note  .  [place] AN TELEVISIT:   Is this a televisit?: No          Patient: Mariah [name] is a 64 year old female      Procedure Information     Date/Time: 09/26/20 1030    Procedure: Lysis of adhesions, small bowel resection (N/A Abdomen)    Diagnosis: SBO (small bowel obstruction) ([place]) [K56.609]    Pre-op diagnosis: SBO (small bowel obstruction) ([place]) [K56.609]    Location: [facility] / [place] [facility]    Surgeons: Florinda Marker, MD          Relevant Problems   No relevant active problems         Previous Anesthetic History:   Past Surgical History:  04/2016: BARIATRIC SURGERY      Comment:  done at [facility]  2006: PARATHYROIDECTOMY      Comment:  partial,   5/00: SALPINGO-OOPHORECTOMY COMPL/PRTL UNI/BI SPX      Comment:  Salpingo-Oophorectomy, bilateral  No date: TONSILLECTOMY ONE-HALF <AGE 64  5/00: TOTAL ABDOMINAL HYSTERECT W/WO RMVL TUBE OVARY      Comment:  Hysterectomy, Total Abdominal secondary to fibroids    Current Medications:    hydrochlorothiazide (HYDRODIURIL) tablet 50 mg, 50 mg, Oral, Daily, Guadalupe Dawn Eichen, PA-C, 50 mg at 09/26/20 0907  bupivacaine PF (MARCAINE PF) 0.25 % injection, , , ,   lactated ringers infusion, , Intravenous, Continuous, Collier Flowers, PA-C, Last Rate: 125 mL/hr at 09/26/20 0342, New Bag at 09/26/20 0342  acetaminophen (TYLENOL) tablet 975 mg, 975 mg, Oral, Q6H PRN, Zenaida Deed, PA-C, 975 mg at 09/25/20 2117  diphenhydrAMINE (BENADRYL) injection 25 mg, 25 mg, Intravenous, Nightly PRN, Juanita Craver, PA-C, 25 mg at 09/25/20 2100  irbesartan (AVAPRO) tablet 300 mg, 300 mg, Oral, Daily, Zenaida Deed, PA-C, 300 mg at 09/26/20 0907  ondansetron (ZOFRAN) injection 4 mg, 4 mg, Intravenous, Q8H PRN, Zenaida Deed, PA-C, 4 mg at 09/25/20 2118  enoxaparin (LOVENOX) injection 40 mg, 40 mg, Subcutaneous, Q24H, Zenaida Deed, PA-C, 40 mg at 09/25/20 1551  valACYclovir (VALTREX) tablet 1,000 mg, 1,000 mg, Oral, TID, Marya Landry, MD, 1,000 mg at  09/26/20 0907  benzocaine-menthol (CEPACOL) lozenge 3.6 mg, 1 lozenge, Buccal, 4x Daily PRN, Zenaida Deed, PA-C, 3.6 mg at 09/25/20 1615        Home Medications  valACYclovir (VALTREX) 500 MG tablet, Take 2 tablets by mouth 3 (three) times daily  for 7 days, Disp: 42 tablet, Rfl: 0  escitalopram (LEXAPRO) 20 MG tablet, Take 1 tablet by mouth daily, Disp: 30 tablet, Rfl: 2  losartan (COZAAR) 50 MG tablet, Take 2 tablets by mouth daily, Disp: 180 tablet, Rfl: 3  omeprazole (PRILOSEC) 20 MG capsule, TAKE 1 CAPSULE BY MOUTH DAILY, Disp: 30 capsule, Rfl: 10  cholecalciferol (VITAMIN D3) 1000 UNIT tablet, Take 1 tablet by mouth daily, Disp: 30 tablet, Rfl: 11  hydrochlorothiazide (HYDRODIURIL) 25 MG tablet, Take 1 tablet by mouth daily, Disp: 90 tablet, Rfl: 3        Allergies:   Review of Patient's Allergies indicates:   Heparin                     Comment:local rash seen during hospitalization Feb 2008   Lisinopril              Cough    Smoking, Alcohol, Drugs:  Social History    Tobacco Use      Smoking status: Former  Smoker        Packs/day: 0.50        Years: 40.00        Pack years: 20        Types: Cigarettes        Quit date: 03/31/2012        Years since quitting: 8.4      Smokeless tobacco: Former Systems developer      Tobacco comment: 3-4 cigarretes a day. Quit about 1 month ago    Alcohol use: Yes      Comment: very occasional      Drug use: No       PMHx:  Past Medical History:  03/27/2006: ABN FIND-STOOL CONTENTS-OCC BLOOD      Comment:  s/p colonoscopy and EGD 7/07  06/04/2006: ABSENCE OF MENSTRUATION - s/p TAH BSO  08/05/2004: ANXIETY DEPRESSION      Comment:  TSH wnl 8/02; trial of fluoxetine 8/05  08/05/2004: BACK PAIN LOW      Comment:  LBP musculoskeletal most likely 1/04  04/23/2006: Cough      Comment:  s/p PFTs September 2007 --  some obstruction at mid to                low lung volumes not correctable by bronchodilator CT                scan showed stable pulmonary nodules over several years                11/07  - cough gone ** note combined with dx of "nodules                on CT scan". Will file this note to hx.     08/05/2004: depressive disorder      Comment:  TSH wnl 8/02; trial of fluoxetine x 6 wks didn't really                help 8/05  trial of citalopram per Dr. Patrice Paradise. later                resolved. 04/15/2014 - Reassessed, pt reports feeling                well, with no sx of depression. Just reports being tired,               which she attributes to current life style. Will resolve                from problem list.    03/27/2006: Diarrhea      Comment:  7/07 The colonoscopy was visually normal and biopsies                taken to rule out microscopic colitis were negative.  A                biopsy of the descending duodenum to rule out celiac                sprue because of the diarrhea was negative.    12/19/2012: Gastritis      Comment:  Combined with entry "esophageal reflux    10/22/2006: Hyperparathyroidism      Comment:  osteoporosis found - pt referred to surgery for the                hyperparathyroidism- s/p parathyroidectomy feb 2008  06/18/2012: Inconclusive mammogram      Comment:  June 2013 - BIRADS 3.  Probable benign mammogram.                Recommend 6 month  follow-up mammogram (Dec 2013).                 Dec/13 - Stable left breast nodule with no evidence of                malignancy. BIRADS 2 benign recommend annual screening                mammogram in 6 months  (June 2014) June/14 - mammogram                request given to pt, she will call to schedule:  Jan/15 -               normal mammogram, yearly screening. Resolve from problem                list.        03/13/2006: Insomnia, unspecified      Comment:  06/25/2012 - intermittent, usually resolved by taking                Tylenol PM 06/09/2014 - good response to trazodone, takes                one to 4x wk, prn 08/24/2016 - reports no problems sleeping               - file to hx, and resolve from problem list.   03/27/2006: nodules on chest CT 1/06  and 3/07      Comment:  s/p PFTs September 2007 --  some obstruction at mid to                low lung volumes not correctable by bronchodilator CT                scan showed stable pulmonary nodules over several years                11/07 - cough gone  04/29/12 - saw pneumologist, Dr Jordan Hawks,               "Will repeat chest CT with high-resolution scans at the 2               month mark (already ordered). If abnormal, would do PFT's               and further rheum w/u, consider lung bx . Pt to call if                any r  08/05/2004: OBESITY NOS      Comment:  has tried meds from [place]  08/05/2004: PERS HX OF PAST NONCOMPLIANCE      Comment:  poor compliance to med regimen  08/05/2004: PERS HX TOBACCO USE      Comment:  quit 10/02; 1/2 PPD since 64 YO  08/05/2004: Screening for hypertension      Comment:  occ elevated BP started on anti-HTN in [place]; chem 7,                EKG, HCT u/a wnl 4/03; d/c'ed HCTZ 8/05 when we learned                from daughter she has not been taking it  05/19/2008: Vitamin D deficiency      Comment:  Supplement per dr Jake Samples.    Vitals  BP 183/81  Pulse 62    Temp 98.1 F (36.7 C) (Oral)    Resp 18    Wt 88 kg (194 lb)    SpO2 96%    BMI 33.82 kg/m     ROS/MED HX    Physical Exam    General     Level of consciousness:  Alert and oriented (time, person, place)   Airway     Mallampati:  III    TM distance:  >3 FB    Mouth opening:  >3 FB    Neck ROM:  Full   Teeth     Heart   - normal exam     Lungs - normal exam     Other findings: None loose per pt.              Pertinent Labs:   Lab Results   Component Value Date    NA 143 09/26/2020    K 3.7 09/26/2020    CREAT 0.7 09/26/2020    GLUCOSER 83 09/26/2020    WBC 3.2 (L) 09/26/2020    HCT 34.9 09/26/2020    PLTA 230 09/26/2020    PT 9.8 04/28/2014    APTT 27.5 04/28/2014    INR < 1.0 04/28/2014         Anesthesia Plan    ASA Score:     ASA:  2    Emergent      Airway:      Mallampati:  III    Mouth opening:  >3 FB    Neck ROM:   Full    TM distance:  >3 FB    Plan: general    Other information:     EKG Reviewed: : Yes      Post-Plan::  PACU    Informed Consent:     Anesthetic plan and risks discussed with:  Patient   Patient Consented

## 2020-09-26 NOTE — Brief Op Note (Signed)
Brief Procedure or Operative Note    Procedure:  Procedure(s):  Lysis of adhesions    Pre-Procedure Diagnosis: Pre-op Diagnosis     * SBO (small bowel obstruction) ([place]) [K56.609]     Post-Procedure Diagnosis: Post-op Diagnosis     * SBO (small bowel obstruction) ([place]) [K56.609]    Surgeon: Primary: Florinda Marker, MD    Assistant (if applicable): Collier Flowers, PA-C    Type of Anesthesia:  General    Findings:   No acute abnormalities, no internal hernias found    Estimated Blood Loss: 1 mL    Specimens Removed: No specimens were documented in this log.    Complications:  None    Other (e.g. Implants):  Nothing was implanted during the procedure   Peripheral IV 09/25/20 Right Lower forearm (Active)   Site Assessment Clean;Dry;Intact 09/26/20 0245   Line Status Flushed;Infusing 09/26/20 0245   Dressing Status Clean;Dry;Intact 09/26/20 0245       [REMOVED] Peripheral IV 09/23/20 Left Antecubital fossa (Removed)   Site Assessment Clean;Dry;Intact 09/24/20 2000   Line Status Infusing 09/24/20 2000   Dressing Status Clean;Dry;Intact 09/24/20 [place], PA-C  Pager : [phone]                  .

## 2020-09-27 MED ORDER — OXYCODONE HCL 5 MG PO TABS
5.00 mg | ORAL_TABLET | ORAL | 0 refills | Status: AC | PRN
Start: 2020-09-27 — End: 2020-09-30

## 2020-09-27 MED ORDER — SIMETHICONE 80 MG PO CHEW
80.0000 mg | CHEWABLE_TABLET | Freq: Four times a day (QID) | ORAL | Status: DC | PRN
Start: 2020-09-27 — End: 2020-09-27

## 2020-09-27 MED ORDER — OXYCODONE HCL 5 MG PO TABS
5.0000 mg | ORAL_TABLET | ORAL | Status: DC | PRN
Start: 2020-09-27 — End: 2020-09-27

## 2020-09-27 MED ORDER — OXYCODONE HCL 10 MG PO TABS
10.0000 mg | ORAL_TABLET | ORAL | Status: DC | PRN
Start: 2020-09-27 — End: 2020-09-27

## 2020-09-27 MED ORDER — DOCUSATE SODIUM 100 MG PO CAPS
100.00 mg | ORAL_CAPSULE | Freq: Every day | ORAL | 0 refills | Status: AC
Start: 2020-09-27 — End: 2020-10-11

## 2020-09-27 MED ORDER — POLYETHYLENE GLYCOL 3350 17 G PO PACK
17.00 g | PACK | Freq: Every day | ORAL | 0 refills | Status: AC
Start: 2020-09-27 — End: 2020-10-11

## 2020-09-27 MED ORDER — ACETAMINOPHEN 325 MG PO TABS
975.0000 mg | ORAL_TABLET | Freq: Four times a day (QID) | ORAL | Status: DC
Start: 2020-09-27 — End: 2020-09-27
  Administered 2020-09-27: 975 mg via ORAL
  Filled 2020-09-27: qty 3

## 2020-09-27 MED ORDER — ACETAMINOPHEN 325 MG PO TABS
650.00 mg | ORAL_TABLET | Freq: Four times a day (QID) | ORAL | 0 refills | Status: AC | PRN
Start: 2020-09-27 — End: 2020-10-04

## 2020-09-27 NOTE — Discharge Instructions (Signed)
Instrues de alta ps-cirurgia    Por favor, leia as instrues descritas abaixo e consulte esta folha nas prximas semanas. Estas instrues de alta fornecem informaes gerais sobre como cuidar de si mesmo depois de deixar o hospital. Seu cirurgio tambm pode lhe dar instrues especficas. Embora o seu tratamento tenha sido planejado de acordo com as prticas mdicas mais atuais disponveis, s vezes ainda ocorrem problemas. Se voc tiver [employer]  ou perguntas aps a alta, ligue para seu cirurgio em [phone].                INSTRUES DE CUIDADO DOMSTICO APS SUA CIRURGIA   Pode tomar banho em 24 horas aps a alta. Aps o banho, pode-se remover apenas os curativos externos. Deixe as tiras esterilizadas no lugar, se aplicvel. Eles cairo por conta prpria.   Mantenha a ferida seca e limpa. A ferida pode ser lavada suavemente com gua e sabo. Seque suavemente ou enxugue aps a limpeza, sem esfregar. No tome banhos, no use piscinas ou banheiras de hidromassagem por duas semanas, ou conforme as instrues de seu cuidador.   S tome medicamentos de venda livre ou prescritos para dor, desconforto ou febre, conforme indicado por seu cuidador.  1. Para a dor, voc pode tomar tylenol 650mg  a cada 6 horas. No tome ibuprofeno ou aspirina.  2. Para dores fortes no ajudadas por esses medicamentos, voc pode tomar oxicodona 5mg  a cada 6 horas, conforme necessrio. A oxicodona  um narctico. Pode ser viciante.  3. Tome colace 100mg  duas vezes ao dia at ter evacuaes normais   Voc pode continuar a dieta normal conforme as instrues.  Voc pode retomar a atividade normal dentro de 48 horas aps a alta.  'Proibido dirigir tomando narcticos.     PROCURE CUIDADOS MDICOS SE:   H vermelhido, inchao ou aumento da dor na rea da ferida.   Pus est saindo da ferida.   Uma inexplicvel temperatura oral acima de 101 F (38,3 C) se desenvolve.   Voc percebe um cheiro ruim vindo da ferida ou  curativo.    Post-Surgery Discharge Instructions    Please read the instructions outlined below and refer to this sheet in the next few weeks. These discharge instructions provide you with general information on caring for yourself after you leave the hospital. Your surgeon may also give you specific instructions. While your treatment has been planned according to the most current medical practices available, problems sometimes still happen. If you have any problems or questions after discharge, please call your surgeon at [phone].                 HOME CARE INSTRUCTIONS AFTER YOUR SURGERY    May shower in 24 hours after discharge.  After shower can remove outer dressings only.  Leave steri strips in place if applicable.  They will fall off on their own.     Keep the wound dry and clean. The wound may be washed gently with soap and water. Gently blot or dab dry following cleansing without rubbing. Do not take baths, use swimming pools or use hot tubs for two weeks, or as instructed by your caregiver.   Only take over-the-counter or prescription medicines for pain, discomfort, or fever as directed by your caregiver.   1. For pain you can take tylenol 650mg  every 6 hours. Do not take ibuprofen or aspirin.   2. For severe pain not helped by these medications you can take oxycodone 5mg  every 6 hours as needed. Oxycodone is  a narcotic. It can be addictive.   3. Take colace 100mg  twice a day until you are having normal bowel movements   You may continue normal diet as directed.    You may resume normal activity within 48 hours after discharge.    No driving while taking narcotics     SEEK MEDICAL CARE IF:    There is redness, swelling, or increasing pain in the wound area.    Pus is coming from the wound.    An unexplained oral temperature above 101 F (38.3 C) develops.    You notice a foul smell coming from the wound or dressing.

## 2020-09-27 NOTE — Discharge Inst - Reason you came to the hospital (Addendum)
You were admitted to the hospital because you had a small bowel obstruction. You did not improve with just rest and decompression from the nasogastric tube, so we performed an operation. During the operation there were no internal hernias or other problems, the intestine looked very healthy. We did not need to remove any small intestine.     Stay on a liquid diet for 2 more days. Make sure to drink plenty of water. Juice, jello, soup, broth, puddings are ok to eat.     Please return to the emergency room if you have more abdominal pain, fevers, or vomiting.     Take miralax and colace daily until you start to have normal bowel movements. Stop taking these if you develop diarrhea. In order for these medications to work you need to drink plenty of water.   -------------------------------------------------------------------------------------------------------------------------------------------  Voc foi internado no hospital porque teve uma obstruo do intestino delgado. Voc no melhorou apenas com repouso e descompresso da sonda nasogstrica, ento fizemos uma operao. Durante a operao no houve hrnias internas ou outros problemas, o intestino parecia muito saudvel. No foi necessrio remover nenhuma parte do intestino delgado.    Mantenha uma dieta lquida por mais 2 dias, por exemplo, suco, gelatina, sopa, caldo e pudim. Bebe bastante gua.     Volte ao pronto-socorro se sentir mais dor abdominal, febre ou vmito.    Tome miralax e colace diariamente at comear a ter movimentos intestinais normais. Pare de tom-los se tiver diarreia. Para que esses medicamentos funcionem, voc precisa beber bastante gua.

## 2020-09-27 NOTE — Plan of Care (Addendum)
[name] speaking, interpreter used. Alert and oriented x 3. VSS, afebrile. No complaints of pain.  Four dressings to abdomen, small old drainage, intact.  Liquid clear diet.  Independent in room. Call light within reach. Will continue to monitor.     1502 - Tolerated clears. No complaints of pain.  Removed IV. Went over discharge paperwork. Left ambulatory off the unit.

## 2020-09-27 NOTE — Plan of Care (Signed)
A&Ox3. Calm and cooperative w/ care. C/o ongoing abd pain. PRN morphine given w/ good effect. Pt resting comfortably in bed. Tolerating clear liq diet well. Surgical PA paged and made aware of expiring IVF order. Per PA, OK to saline lock since pt is tolerating clears well. OOB independently to BR. Tolerates well. Dressings to abd noted to have small amounts of serosang drainage but all dry and intact. No acute distress noted. Safety and contact precautions maintained.    6734- Pt c/o ongoing abd pain. PRN morphine given w/ good effect.     Bed in lowest position. Call light within reach. Needs met.     Problem: Pain  Goal: Patient's pain/discomfort is manageable  Description: Assess and monitor patient's pain using appropriate pain scale. Collaborate with interdisciplinary team and initiate plan and interventions as ordered. Re-assess patient's pain level 30 - 60 minutes after pain management intervention.   Outcome: Progressing     Problem: Knowledge Deficit  Goal: Patient/S.O. demonstrates understanding of disease process, treatment plan, medications, and discharge instructions.  Description: Complete learning assessment and assess knowledge base  Outcome: Progressing     Problem: Insufficient Fluid Volume  Goal: Fluid and electrolyte balance are achieved/maintained  Description: Assess and monitor vital signs (orthostatic vitals if applicable), fluid intake and output, urine color, labs, skin turgor, mucous membranes, mental status, and gastrointestinal system for nausea, vomiting and diarrhea.  Monitor for signs and symptoms of hypovolemia (tachycardia, rapid breathing, decreased urine output, postural hypotension, confusion, syncope).  Collaborate with interdisciplinary team and initiate plan and interventions as ordered.  Outcome: Progressing     Problem: Inadequate Coping  Goal: Demonstrates ability to cope effectively  Description: Patient is able to verbalize feelings related to emotional state.  Outcome:  Progressing

## 2020-09-27 NOTE — Progress Notes (Signed)
Surgery Progress Note    Post-op day #: 1   S/P: Laparoscopic cmall bowel adhesion lysis, diagnostic laparoscopy, tap block     Reason for Hospitalization: Abdominal Pain + N/V w/ CT scan showing small bowel distention     Subjective: Used Development worker, community. Pt is resting well, looks fine. Slept fine this night after taking sleep medication at 9PM. No pain during the night to wake up pt. No N/V since operation; has not had a bowel movement or passed gas since operation. Pt reports only voiding "a little bit." Pt reports bit of confusion on the procedure and what happened with her pain - provider tried best to explain process. Pt reports having 2 questions - 1) What are the chances of the adhesions coming back? 2) How long is the recovery process? Dressings on port incisions looked clean/dry with one with some staining. Pt endorses no calf pain or swelling.     Objective:  Vitals:  BP: (136-183)/(68-90)   Temp:  [97 F (36.1 C)-98.6 F (37 C)]   Pulse:  [51-84]   Resp:  [7-18]   SpO2:  [94 %-100 %]     Ins/Outs:  Last 24 Hours :  I/O 24 Hrs:  In: 1749.2 [P.O.:120; I.V.:1629.2]  Out: 1450 [Urine:1450]    Physical Exam:  VITALS: BP 136/81    Pulse 66    Temp 98.1 F (36.7 C) (Oral)    Resp 18    Ht 5' 3.5" (1.613 m)    Wt 88 kg (194 lb)    SpO2 97%    BMI 33.83 kg/m   CONSTITUTIONAL: Well-nourished, and in no apparent distress   SKIN:  Dry and warm, incision sites on abdomen looked clean without any fluid.   CV: S1/S2 Regular rate and rhythm, no murmurs, no rubs, no gallops.   RESP: chest clear to auscultation b/l no wheeze or crackles  GI: Abdomen soft, tender to palpation around incision sites mainly epigastric, LUQ, LLQ, non-distended, hypoactive bowel sounds.   Lower Ext- No edema. No rashes.  No skin ulcers. Peripheral pulses intact.   NEURO: A&Ox3, moving all four extremities spontaneously. Normal speech  PSYCH: Affect appropriate. Speech rate and thought content normal.      LABS:  Lab Results    Component Value Date    NA 143 09/26/2020    K 3.7 09/26/2020    CL 103 09/26/2020    CO2 28 09/26/2020    BUN 8 09/26/2020    CREAT 0.7 09/26/2020    GLUCOSER 83 09/26/2020    MG 1.8 09/26/2020    PHOS 4.4 09/26/2020    TBILI 0.6 09/23/2020    AST 28 09/23/2020    ALT 45 09/23/2020    ALKPHOS 192 (H) 09/23/2020     Lab Results   Component Value Date    WBC 3.2 (L) 09/26/2020    HGB 11.2 09/26/2020    HCT 34.9 09/26/2020    PLTA 230 09/26/2020     No components found for: LIPASE    Imaging: No recent imaging performed.     Assessment/Plan:  Patient is 64 year old, female w/h of HTN, gastric bypass surgery, 2 C-sections, b/l salpingo-oopheorectomy, gastritis, who presents s/p diagnostic laparoscopy and laparoscopy small bowel adhesion lysis in good recovery condition.     #Post-Op Care / Pain   Assessment: Pt is doing fine, endorses mild pain and uncomfortableness on positional changes. Dressings looked clean/dry except one that had staining. Mildly concerned about pt not passing  gas, concerned about post-operative ileus.     Diagnostics:   -Repeat Abdomen CT to see decrease of small bowel distention and wall thickening - look to see gas resolving   -Repeat CBC / BMP to check for any signs of post-operative infection, and leukocytosis in particular    Plan:   -Continue on colace (100mg  x2) until bowel movement  -Continue on Lovenox and confirm that pt understands how to provide subq injection by themself  -Continue PRN morphine for pain (last received 6AM) and Zofran for nausea PRN.    -IV fluids and ambulation to encourage recovery.   -Check tolerance of clears diet, if better, switch to soft food?     #Chronic Issues  -continue on HCTZ (50mg  x1) for HTN management    Leafy Kindle   Pager #: [phone]

## 2020-09-27 NOTE — Initial Assessments (Signed)
Nutrition Documentation Form     Nutrition Evaluation: Initial Assessment    This is a 64 year old female  w/h of HTN, gastric bypass surgery, 2 C-sections, b/l salpingo-oopheorectomy, gastritis, who presents s/p diagnostic laparoscopy and laparoscopy small bowel adhesion lysis in good recovery condition.    Met with pt at bedside, spoke with pt via [place] interpreter service. Pt reports appetite is intact, states she is hungry, has been consuming clear liquids. Review of diet progressions, initially low fiber diet for bowel rest, review of low fiber diet/foods, discussed progression to high fiber diet, review of high fiber foods, increased need  Of H2O intake, and discussed importance of physical activity. Pt engaged in education, asked questions, pt verbalizes understanding.     Chart reviewed and events noted since admission. Pt discussed in MDRs.  Nutritional Hx: per H&P pt admits with nausea/vomiting, abd pain, with c/o constipation.   Nutrition Clinical Course: per MD notes pt admits with SBO, NGT placed 10/7 and discontinued 10/9, no significant improvement with conservative measures, pt brought to OR 10/10 for diagnostic laparoscopy, laparoscopic lysis of adhesions, tap block. Awaiting return of bowel function, repeat abd CT pending.   Nutrition related labs: reviewed, WNL  Fluid status: no fluid noted per flowsheets  BMs: unclear per documentation  Diet order: clear liquids  10/7 NPO, diet advanced to clear liquids yesterday 10/10, pt unable to meet nutritional needs x 4 days.     Patient Active Problem List:     History of bariatric surgery     LATERAL FOOT PAIN     Benign hypertensive heart disease without heart failure     Alkaline phosphatase elevation     Calculus of kidney     Gastritis     Allergic rhinitis due to other allergen     Osteoporosis     Murmur heart     Family history of throat cancer     Vitamin D deficiency     Subclavian artery stenosis, left (HCC)     Schatzki's ring     Duodenal  ulcer, unspecified as acute or chronic, without hemorrhage, perforation, or obstruction     Mild depression ([place])     Glaucoma suspect of both eyes     Hyperopia with presbyopia of both eyes     Abnormal CT lung screening     Elevated LFTs     COVID-19 virus infection     SBO (small bowel obstruction) ([place])    Past Medical History:  03/27/2006: ABN FIND-STOOL CONTENTS-OCC BLOOD      Comment:  s/p colonoscopy and EGD 7/07  06/04/2006: ABSENCE OF MENSTRUATION - s/p TAH BSO  08/05/2004: ANXIETY DEPRESSION      Comment:  TSH wnl 8/02; trial of fluoxetine 8/05  08/05/2004: BACK PAIN LOW      Comment:  LBP musculoskeletal most likely 1/04  04/23/2006: Cough      Comment:  s/p PFTs September 2007 --  some obstruction at mid to                low lung volumes not correctable by bronchodilator CT                scan showed stable pulmonary nodules over several years                11/07 - cough gone ** note combined with dx of "nodules                on  CT scan". Will file this note to hx.     08/05/2004: depressive disorder      Comment:  TSH wnl 8/02; trial of fluoxetine x 6 wks didn't really                help 8/05  trial of citalopram per Dr. Patrice Paradise. later                resolved. 04/15/2014 - Reassessed, pt reports feeling                well, with no sx of depression. Just reports being tired,               which she attributes to current life style. Will resolve                from problem list.    03/27/2006: Diarrhea      Comment:  7/07 The colonoscopy was visually normal and biopsies                taken to rule out microscopic colitis were negative.  A                biopsy of the descending duodenum to rule out celiac                sprue because of the diarrhea was negative.    12/19/2012: Gastritis      Comment:  Combined with entry "esophageal reflux    10/22/2006: Hyperparathyroidism      Comment:  osteoporosis found - pt referred to surgery for the                hyperparathyroidism- s/p parathyroidectomy feb  2008  06/18/2012: Inconclusive mammogram      Comment:  June 2013 - BIRADS 3. Probable benign mammogram.                Recommend 6 month  follow-up mammogram (Dec 2013).                 Dec/13 - Stable left breast nodule with no evidence of                malignancy. BIRADS 2 benign recommend annual screening                mammogram in 6 months  (June 2014) June/14 - mammogram                request given to pt, she will call to schedule:  Jan/15 -               normal mammogram, yearly screening. Resolve from problem                list.        03/13/2006: Insomnia, unspecified      Comment:  06/25/2012 - intermittent, usually resolved by taking                Tylenol PM 06/09/2014 - good response to trazodone, takes                one to 4x wk, prn 08/24/2016 - reports no problems sleeping               - file to hx, and resolve from problem list.   03/27/2006: nodules on chest CT 1/06 and 3/07      Comment:  s/p PFTs September 2007 --  some obstruction at mid to  low lung volumes not correctable by bronchodilator CT                scan showed stable pulmonary nodules over several years                11/07 - cough gone  04/29/12 - saw pneumologist, Dr Jordan Hawks,               "Will repeat chest CT with high-resolution scans at the 2               month mark (already ordered). If abnormal, would do PFT's               and further rheum w/u, consider lung bx . Pt to call if                any r  08/05/2004: OBESITY NOS      Comment:  has tried meds from [place]  08/05/2004: PERS HX OF PAST NONCOMPLIANCE      Comment:  poor compliance to med regimen  08/05/2004: PERS HX TOBACCO USE      Comment:  quit 10/02; 1/2 PPD since 64 YO  08/05/2004: Screening for hypertension      Comment:  occ elevated BP started on anti-HTN in [place]; chem 7,                EKG, HCT u/a wnl 4/03; d/c'ed HCTZ 8/05 when we learned                from daughter she has not been taking it  05/19/2008: Vitamin D deficiency      Comment:   Supplement per dr Jake Samples.                                                                            Anthropometrics  Height: 5' 3.5" (161.3 cm)  Weight: 88 kg (194 lb)  IBW/kg (Calculated): 53.55    Most Recent Weight Reading(s)  09/23/20 : 88 kg (194 lb)  07/26/20 : 78.9 kg (174 lb)  12/27/18 : 78.9 kg (174 lb)  10/08/18 : 81.6 kg (180 lb)  11/15/17 : 80.9 kg (178 lb 4 oz)  10/25/17 : 80.7 kg (177 lb 14.4 oz)  09/06/17 : 80.3 kg (177 lb)  07/19/17 : 78.9 kg (174 lb)  11/03/16 : 78.9 kg (174 lb)  09/07/16 : 83.9 kg (185 lb)  08/24/16 : 83.5 kg (184 lb)  05/18/16 : 92.5 kg (204 lb)  11/18/15 : 101.3 kg (223 lb 6.4 oz)  05/27/15 : 97.5 kg (215 lb)  02/12/15 : 98.4 kg (217 lb)  wts variable, continue wt trends.        Current Diet:   Orders Placed This Encounter      Diet - Liquid-clear          Skin Integrity: Other (Comment) (incision)  Skin Location: abdomen      Recent labs:  Lab Results   Component Value Date    CA 8.9 09/26/2020    NA 143 09/26/2020    K 3.7 09/26/2020    CL 103 09/26/2020    MG 1.8 09/26/2020    CO2 28 09/26/2020  PHOS 4.4 09/26/2020    BUN 8 09/26/2020    CREAT 0.7 09/26/2020    GLUCOSER 83 09/26/2020     Lab Results   Component Value Date    WBC 3.2 (L) 09/26/2020    HGB 11.2 09/26/2020    HCT 34.9 09/26/2020    PLTA 230 09/26/2020    RBC 4.05 09/26/2020    ALBUMIN 4.3 09/23/2020       Scheduled Medications:   acetaminophen  975 mg Oral q6h    hydrochlorothiazide  50 mg Oral Daily    docusate sodium  100 mg Oral BID    irbesartan  300 mg Oral Daily    enoxaparin (LOVENOX) injection  40 mg Subcutaneous Q24H    valACYclovir  1,000 mg Oral TID     PRN Medications: oxyCODONE **OR** oxyCODONE, simethicone, diphenhydrAMINE, ondansetron, benzocaine-menthol    Estimated Needs  Kcals:  (1550-1860kcals (25-30kcals/kg))  Protein (g):  (62-68g (1.0-1.1g/kg))  Fluid (ml):  (1862mls (58mls/kg))  Needs based on: Kcal/kg ;Gm/kg;mL/kg (using ABW 62kg)    Nutrition Diagnosis Intake  Energy Balance:  Inadequate energy intake  Clinical  Functional: Altered GI function      Nutrition Risk Level Initial: Moderate risk Follow Up: High risk   Follow Up Date Nutrition Follow Up Date: 09/30/20 (diet adv/GI tolerance, POs, wt trends, labs)    PES (Problem Etiology Signs/ Symptoms) Statement:  1. Inadequate energy intake r/t altered GI function AEB pt NPO/CLD x 4 days, unable to meet nutrient needs.     2. Altered GI function r/t SBO AEB pt admits with N/V, c/o abd pain, constipation, inability to tolerate solid foods, NPO/CLD x 4 days.     Interventions:                                          1. Recommend continue with PO diet as medically able, recommend timely diet adv as able               -Current diet of clear liquids, recommend timely diet adv to FLD-->low residue as able (24-48hrs)   -Will provide Ensure Clears while on CLD  *if unable to adv diet in 48-48hrs may consider initiation of nutrition support  2. Recommend checking electrolytes including K+, Mg+Phos regularly, replete PRN  3. Recommend MVI+minerals to provide micronutrient coverage   4. Recommend Vit D supplement  5. Weekly weights to trend, recommend obtaining dry wt if able   6. Review of diet progressions/adv, low fiber diet initially with goal of high fiber diet as GI function improves, handouts provided.                                              Goals:   1. Hemodynamic stability                -Fluid / electrolyte balance                -Normalization of nutrition related labs  2. Appropriate diet for medical and clinical conditions               -Nutritional delivery tolerance   3. Pt to then meet minimum 75% estimated energy needs                -  To take minimum 1-2 ONS/d  4. Avoid unintentional wt changes        Monitoring and evaluation:   -Monitor po/ONS intake  -Monitor timely diet adv 24-48hrs vs initiation of nutrition support  -Monitor GI function, BMs  -Monitor labs, supplementation as above  -Monitor wt trends, body composition  changes         Name Larina Earthly RD     Date 09/27/2020    Time 1:28 PM       Pager [phone]

## 2020-09-28 NOTE — Progress Notes (Signed)
Pt discharged home no services( post d/c CM chart review ) SV

## 2020-09-29 ENCOUNTER — Telehealth (HOSPITAL_BASED_OUTPATIENT_CLINIC_OR_DEPARTMENT_OTHER): Payer: Self-pay

## 2020-09-29 NOTE — Telephone Encounter (Signed)
Note already written for this telephone encounter.

## 2020-09-29 NOTE — Telephone Encounter (Signed)
Mariah Lowrey, RN, 09/29/2020    Called back patient using [place] interpreter, patient wanted to ask the provider if she can take the dressing off on her abdomen.    Patient had procedure "Diagnostic laparoscopy, laparoscopic lysis of adhesions, tap block" with Dr. Loa Socks on 09/26/2020.     Informed patient that one of the Surgical PA-Cs will be notified and will call back with any recommendations, verbalized understanding.    Advised to call the clinic for any questions or concerns.

## 2020-09-29 NOTE — Telephone Encounter (Signed)
-----   Message from Randa Spike sent at 09/29/2020  2:19 PM EDT -----  Regarding: post surgical instructions  Called the patient to give her post op appointment and asked me questions about post surgery instructions.

## 2020-09-29 NOTE — Anesthesia Postprocedure Evaluation (Signed)
Anesthesia Post-Operative Evaluation Note    Patient: Mariah [name]           Procedure Summary     Date: 09/26/20 Room / Location: [facility] OR 2 / [facility]    Anesthesia Start: 5396 Anesthesia Stop: 1242    Procedure: Lysis of adhesions (N/A Abdomen) Diagnosis:       SBO (small bowel obstruction) (HCC)      (SBO (small bowel obstruction) ([place]) [K56.609])    Surgeons: Florinda Marker, MD Responsible Provider: Candice Camp, MD    Anesthesia Type: general ASA Status: 2 - Emergent            POST-OPERATIVE EVALUATION    Anesthesia Post Evaluation    Vitals signs in patient's normal range: Yes  Respiratory function stable; airway patent: Yes  Cardiovascular function stable: Yes  Hydration status stable: Yes  Mental status recovered; patient participates in evaluation and/or is at baseline: Yes  Pain control satisfactory: Yes  Nausea and vomiting control satisfactory: Yes    Procedure was labor & delivery no  PostOP disposition PACU  Anesthesia Observation no significant observation      Last vitals  Vitals Value Taken Time   BP 165/71 09/26/20 1301   Temp 97 F (36.1 C) 09/26/20 1301   Pulse 60 09/26/20 1301   Resp 7 09/26/20 1301   SpO2 100 % 09/26/20 1301

## 2020-09-30 NOTE — Discharge Summary (Signed)
Physician Discharge Summary     Patient ID:  Mariah [name]  [id number]  64 year old  December 14, 1956    Admit date: 09/23/20      Discharge date and time: 09/27/20    Admitting Physician: Marthe Patch, MD     Discharge Physician: Florinda Marker, MD    Admission Diagnoses: SBO (small bowel obstruction) [facility]) [K56.609]    Discharge Diagnoses:   53yoF w/ hx of laparoscopic RNYGBP 2017, c-section x2, TAH, b/l salpingo-oophorectomy, gastritis and HTN, presented with 3d of abdominal pain and nausea/vomiting. In the ED pt was afebrile and hemodynamically stable, well appearing with a softly distended abdomen, labs notable for pre-renal AKI, no leukocytosis, no other electrolyte abnormalities. CT A/P demonstrated extensive small bowel dilatation w/ air-fluid levels and transition point at ileocecal valve consistent with small bowel obstruction. Pt was admitted to the surgical service, had an NGT placed and given IV fluids, analgesics and antiemetics. Over the course of hospital days 1-3 pt failed to have return of bowel function and continued with pain, so the decision was made to take pt to the OR for diagnostic laparoscopy with lysis of adhesions.  No internal hernia was found and no small bowel resection was needed. Post-operatively, the patient tolerated a clear liquid diet, had improvement in abdominal pain, ambulated, and voided without issue. She was discharged home on POD#1 with outpatient follow up.     Admission Condition: fair    Discharged Condition: good    Indication for Admission: small bowel obstruction, nausea/ vomiting    Hospital Course: 63yoF w/ hx of Roux en Y gastric bypass     Consults: none    Significant Diagnostic Studies:    CT A/P 10/7   IMPRESSION:     CT scan of the abdomen/pelvis reveals:   1. Hiatal hernia.   2. Status post previous gastric bypass surgery. Interval expansion of the excluded stomach containing fluid and gas, suspicious for gastric-gastric fistula.   3. Small  bowel obstruction, with transition point at the ileocecal valve.   4. Focal wall thickening and nondistended ileum. Underlying lesion cannot be excluded.   5. Mild hepatic steatosis.   6. Other findings as above.        Treatments: As above, laparoscopic surgery, NGT decompression, IV fluids, antiemetics, IV and PO analgesics    Discharge Exam:  BP 136/81    Pulse 66    Temp 98.1 F (36.7 C) (Oral)    Resp 18    Ht 5' 3.5" (1.613 m)    Wt 88 kg (194 lb)    SpO2 97%    BMI 33.83 kg/m   General: no acute distress  HEENT: moist mucous membranes, no scleral icterus  Pulm: clear to ausculation b/l  CV: regular rate and rhythm  Abd: soft, non-distended. Port site incisions clean and dry  LE: no edema  NEURO: A&Ox3, moving all four extremities spontaneously      Disposition: home with outpatient follow up    Patient Instructions:   Discharge Medication List as of 09/27/2020  2:48 PM    START taking these medications    polyethylene glycol (MIRALAX) 17 g packet  Take 1 packet by mouth daily  for 14 daysE-Prescribe, Disp-14 packet, R-0    acetaminophen (TYLENOL) 325 MG tablet  Take 2 tablets by mouth every 6 (six) hours as needed for Pain  for up to 7 daysE-Prescribe, Disp-56 tablet, R-0    oxyCODONE (ROXICODONE) 5 MG immediate release tablet  Take  1 tablet by mouth every 4 (four) hours as needed for Pain (severe pain not relieved by other medications) Max Daily Amount: 30 mg  for up to 3 daysE-Prescribe, Disp-8 tablet, R-0Partial fill upon patient's request.    docusate sodium (COLACE) 100 MG capsule  Take 1 capsule by mouth daily  for 14 daysE-Prescribe, Disp-14 capsule, R-0      CONTINUE these medications which have NOT CHANGED    valACYclovir (VALTREX) 500 MG tablet  Take 2 tablets by mouth 3 (three) times daily  for 7 daysE-Prescribe, Disp-42 tablet, R-0    escitalopram (LEXAPRO) 20 MG tablet  Take 1 tablet by mouth dailyE-Prescribe, Disp-30 tablet, R-2    losartan (COZAAR) 50 MG tablet  Take 2 tablets by mouth  dailyE-Prescribe, Disp-180 tablet, R-3refill needed    omeprazole (PRILOSEC) 20 MG capsule  TAKE 1 CAPSULE BY MOUTH DAILYE-Prescribe, Disp-30 capsule, R-1004/15/2021 12:56:24 PM    cholecalciferol (VITAMIN D3) 1000 UNIT tablet  Take 1 tablet by mouth dailyE-Prescribe, Disp-30 tablet, R-11    hydrochlorothiazide (HYDRODIURIL) 25 MG tablet  Take 1 tablet by mouth dailyE-Prescribe, Disp-90 tablet, R-3        Activity: activity as tolerated  Diet: liquid diet x2 days, then advance slowly   Wound Care: keep wound clean and dry    Code Status during hospital stay: Full Code    [place] Future Appointments:  Current and Future Appointments at [facility] (90 Days) 09/30/2020 - 12/29/2020              Date Visit Type Length Department Provider     10/08/2020  9:30 AM OFF 15 NEW 15 min [facility] [[id number]] Florinda Marker, MD    Patient Instructions:                   10/11/2020  1:00 PM RX CONSULT 60 60 min [facility] [[id number]] Cephus Slater, PharmD    Patient Instructions:                   10/19/2020 10:00 AM TELEVISIT 20 min [facility] [[id number]] Marquette Saa, APRN    Patient Instructions:     You have a video televisit appointment with your [facility] provider within the next 7 days. You will receive a notification about this the day before your appointment.  Thirty minutes before your appointment you will receive a link to your video visit in [place] and via text and email before your scheduled appointment time.  The link will walk you through steps to make sure you are set up to connect with your provider at the time of the visit and give you access to the video visit.  If you have trouble connecting with your provider, they will call you at the time of your visit at the phone number your clinic has on file.                      Follow-up:  Follow-up Information     Follow up With Specialties Details Why Contact Info        home no services           Signed:  Collier Flowers, PA-C  09/30/2020  3:12 PM

## 2020-10-08 ENCOUNTER — Ambulatory Visit
Admission: RE | Admit: 2020-10-08 | Discharge: 2020-10-08 | Disposition: A | Discharge status: Home / Self Care | Payer: No Typology Code available for payment source

## 2020-10-08 ENCOUNTER — Other Ambulatory Visit: Payer: Self-pay

## 2020-10-08 ENCOUNTER — Ambulatory Visit (HOSPITAL_BASED_OUTPATIENT_CLINIC_OR_DEPARTMENT_OTHER): Payer: No Typology Code available for payment source

## 2020-10-08 VITALS — BP 136/82 | HR 71 | Temp 97.6°F | Ht 63.5 in | Wt 192.0 lb

## 2020-10-08 DIAGNOSIS — R109 Unspecified abdominal pain: Secondary | ICD-10-CM

## 2020-10-08 DIAGNOSIS — R10A0 Flank pain, unspecified side: Secondary | ICD-10-CM

## 2020-10-08 DIAGNOSIS — K909 Intestinal malabsorption, unspecified: Secondary | ICD-10-CM | POA: Diagnosis present

## 2020-10-08 DIAGNOSIS — Z48815 Encounter for surgical aftercare following surgery on the digestive system: Secondary | ICD-10-CM | POA: Diagnosis not present

## 2020-10-08 LAB — CBC, PLATELET & DIFFERENTIAL
ABSOLUTE BASO COUNT: 0.1 10*3/uL (ref 0.0–0.1)
ABSOLUTE EOSINOPHIL COUNT: 0.1 10*3/uL (ref 0.0–0.8)
ABSOLUTE IMM GRAN COUNT: 0.02 10*3/uL (ref 0.00–0.03)
ABSOLUTE LYMPH COUNT: 1.9 10*3/uL (ref 0.6–5.9)
ABSOLUTE MONO COUNT: 0.3 10*3/uL (ref 0.2–1.4)
ABSOLUTE NEUTROPHIL COUNT: 3 10*3/uL (ref 1.6–8.3)
ABSOLUTE NRBC COUNT: 0 10*3/uL (ref 0.0–0.0)
BASOPHIL %: 1.3 % — ABNORMAL HIGH (ref 0.0–1.2)
EOSINOPHIL %: 2.6 % (ref 0.0–7.0)
HEMATOCRIT: 39.4 % (ref 34.1–44.9)
HEMOGLOBIN: 12.5 g/dL (ref 11.2–15.7)
IMMATURE GRANULOCYTE %: 0.4 % (ref 0.0–0.4)
LYMPHOCYTE %: 34.6 % (ref 15.0–54.0)
MEAN CORP HGB CONC: 31.7 g/dL (ref 31.0–37.0)
MEAN CORPUSCULAR HGB: 27.3 pg (ref 26.0–34.0)
MEAN CORPUSCULAR VOL: 86 fl (ref 80.0–100.0)
MEAN PLATELET VOLUME: 9.8 fL (ref 8.7–12.5)
MONOCYTE %: 6.3 % (ref 4.0–13.0)
NEUTROPHIL %: 54.8 % (ref 40.0–75.0)
NRBC %: 0 % (ref 0.0–0.0)
PLATELET COUNT: 425 10*3/uL — ABNORMAL HIGH (ref 150–400)
RBC DISTRIBUTION WIDTH STD DEV: 43 fL (ref 35.1–46.3)
RED BLOOD CELL COUNT: 4.58 M/uL (ref 3.90–5.20)
WHITE BLOOD CELL COUNT: 5.4 10*3/uL (ref 4.0–11.0)

## 2020-10-08 LAB — URINALYSIS
BACTERIA: >50 PER HPF — AB (ref 0–5)
BILIRUBIN, URINE: NEGATIVE
CASTS: NONE SEEN PER LPF
GLUCOSE, URINE: NEGATIVE MG/DL
KETONE, URINE: NEGATIVE MG/DL
LEUKOCYTE ESTERASE: NEGATIVE
NITRITE, URINE: POSITIVE — AB
OCCULT BLOOD, URINE: NEGATIVE
PH URINE: 5.5 (ref 5.0–8.0)
PROTEIN, URINE: NEGATIVE MG/DL
RED BLOOD CELLS URINE: NONE SEEN PER HPF (ref 0–2)
SPECIFIC GRAVITY URINE: 1.02 (ref 1.003–1.035)
SQUAMOUS EPITHELIAL CELLS: >10 PER LPF — AB (ref 0–4)

## 2020-10-08 NOTE — Assessment & Plan Note (Signed)
Patient is status post repair of internal hernia from her gastric bypass.    Patient is not taking normal multivitamin supplements.    I have advised her to take it.  I will also give her alternative supplements.  Bariatric fusion is a product but it is not covered by insurance    We will also check CBC, CHEM, vitamin B12 levels  Urine analysis-to work-up flank pain  She will follow-up in 1 month

## 2020-10-08 NOTE — Procedures (Deleted)
Cathlean Sauer, MD - 09/03/2017  Formatting of this note might be different from the original.  EXAMINATION:  US ABDOMINAL, COMPLETE    HISTORY:  Abnormal LFTs    COMPARISON:  None.    FINDINGS:    Pancreas: Visualized pancreas is unremarkable.    Liver: Liver has a mildly coarsened echotexture. Increased liver   echogenicity suggestive of hepatic steatosis. No focal solid mass or   dilated intrahepatic ducts.    Gallbladder: Gallbl adder is normal. No gallstones, wall thickening or   fluid around the gallbladder.    Proximal CBD measures 5 mm.    Right kidney measures 11.7 cm. There is a 1.7 x 1.5 x 1.5 cm   parapelvic renal cyst in the right kidney. Left kidney measures 12.4   cm. There is a 1.7 x 1.7 x 1.7 cm cyst with thin septations at the   inferior pole of the left kidney.    Spleen measures 8.5 cm. Spleen is normal.    Ascites: None.    Visualized aorta and  IVC are unremarkable.    IMPRESSION:   1. Mildly coarsened liver echotexture. Increased liver echogenicity   suggestive of hepatic steatosis.  2. There is a 1.7 cm cyst with thin septations in the inferior pole   the left kidney. A renal mass protocol CT or MRI with IV contrast is   recommended for further evaluation.     I personally reviewed the study and agree with the dictated report.

## 2020-10-08 NOTE — Procedures (Deleted)
Delena Serve, MD - 09/26/2017  Formatting of this note might be different from the original.  EXAMINATION:  CT UROGRAPHY CT ABDOMEN AND PELVIS WITH AND WO CONTRAST    HISTORY:  1.7 cm cyst with thin septations in the inferior pole    COMPARISON:  Correlated with abdominal ultrasound from September 03, 2017.    TECHNIQUE: Contiguous 1.25 mm axial images were obtained from the   lung bases through the greater trochanters without IV  contrast and   with IV contrast in the nephrographic and excretory phases. Coronal   and sagittal reformats of the abdomen and pelvis were included.    CT Dose Reduction: Exam was performed using either iterative   reconstruction technique or adjustment of [place] and/or kV according to   the patient size.    CONTRAST: 100  mL of Isovue  370 was administered intravenously.    FINDINGS:    Lung bases: No focal consolidation or pleural effusio n.   3 mm pulmonary nodule in the right middle lobe (series 3 image 1).  3 mm pulmonary nodule in the right lower lobe (series 3 image 6).    Liver: Normal.    Gallbladder and biliary tree: No calcified gallstones. Normal caliber   wall. No intra- or extrahepatic biliary ductal dilation.    Pancreas: Normal.    Spleen: Normal    Adrenals: Normal.    Kidneys, ureters and bladder: There is a nonenhancing simple renal   cysts in the inferi or pole of the left kidney measuring up to 1.5 cm   correlates with abdominal ultrasound finding from September 2018.   Simple renal cysts in the midpole the right kidney measuring up to   1.5 cm. No evidence of hydronephrosis, calculus, renal or ureteral   mass. Limited evaluation of the bladder shows no gross abnormality.     Reproductive organs: No pelvic masses    Bowel: Postsurgical changes from gastric bypass.    Lymph nodes: Nons pecific 0.9 cm external iliac lymph node series 4   image 103).    Peritoneum: No ascites or free air. No other fluid collection.    Vessels: Mild atherosclerotic vascular calcification  of the abdominal   aorta and its major branches..    Retroperitoneum: Normal.    Abdominal wall: Small fat-containing inguinal hernia..    Bones: No suspicious bony lesions. Multilevel degenerative changes of   the visualized thoracolumbar spine.    IMPR ESSION:   Bilateral simple renal cysts including the simple renal cyst in the   lower pole of the left kidney correlating with findings from   ultrasound from September 2018. No suspicious renal or ureteral mass.   No hydronephrosis or renal calculus.    Pulmonary nodules measuring up to 3 mm as described above. If the   patient has history of smoking or other risk factors, a follow-up low   dose chest CT may be obtained in 12 months to  confirm nodule   stability. If this is a low-risk patient, no further follow-up is   needed.    I personally reviewed the study and agree with the dictated report

## 2020-10-08 NOTE — Progress Notes (Signed)
Chief complaint: Status post diagnostic laparoscopy and lysis of adhesions.  History of present illness: Patient had a small bowel obstruction with a history of gastric bypass in 2017.  After reducing the obstruction we closed the internal hernia.  Patient has some occasional twinges of pain at the incision.  Also complains of right flank pain.  Previously no history of kidney stones.    Patient does not take the multivitamins as religiously as she did before.  Recently she has not taken any of them.  She also wants to have a sip of wine     Patient appears well, alert and oriented x 3, pleasant, cooperative.    Vitals are as noted.   Neck supple and free of adenopathy, or masses. No thyromegaly.   Eyes: EOMI.   Lungs are clear to auscultation.   Heart sounds are normal, no murmurs, clicks, gallops or rubs.   Abdomen is soft, no tenderness-Incision appropriately tender.  Small hematoma underneath the left upper incision.  No CVA tenderness some discomfort on the right Extremities are normal. Peripheral pulses are normal.   Skin is normal without suspicious lesions noted.

## 2020-10-11 ENCOUNTER — Institutional Professional Consult (permissible substitution) (HOSPITAL_BASED_OUTPATIENT_CLINIC_OR_DEPARTMENT_OTHER): Payer: Self-pay | Admitting: Pharmacist

## 2020-10-11 NOTE — Progress Notes (Deleted)
Hypertension Management Initial Visit    Subjective:  Interpreter used: Yes. [place] [phone])    Mariah [name] is a 64 year old female who presents via telephone for hypertension initial visit.    Current medications for hypertension include:  1. Losartan 50 mg tablets - 2 tablets daily (TDD: 100 mg)  2. Hydrochlorothiazide 25 mg daily    Previous HTN medication trials:  - Irbesartan  - Lisinopril (caused cough)     Patient reported adherence: Patient reports taking medication every day. States only "once in a great while" missing medication.    Side effects from current regimen: None reported.    Self Monitoring of BP:  The patient is using a digital machine: Yes, but machine has not been correlated in clinic yet. Reports BP readings of "13/90" and "14/80" (likely meaning 130/90 and 140/80).  Blood pressure cuff validation per AMA 2020  STEP 1: Complete the table below.  5 blood pressure readings taken using a combination of the patient's SMBP device and the office's method of blood pressure measurement.     MEASUREMENT DEVICE SYSTOLIC BLOOD PRESSURE (SBP)   A PATIENT'S    B PATIENT'S    C OFFICE'S    D PATIENT'S    E OFFICE'S      STEP 2:    Average measurements B and D ((B+D)/2): ***   The difference of average of B and D (above) and measurement C (Above - C): ***  The difference is: {BPValidateSys:18092}    STEP 3:   Average measurements  ((C+E)/2): ***   The difference of average of C and E (above) - measurement D  (Above - D): ***  The difference is: {BPCUFFVALIDATE 2:18093}      Machines to be validated in clinic annually. Last validated in clinic on: Due now    Signs/symptoms of hypotension: None reported     Signs/symptoms of hypertension: None reported    SH: (smoking/etoh):   - Denies alcohol.  - Denies tobacco.    Diet:  - Caffeine: 1 cup of coffee in the morning. Very rarely will have tea or soda.  - Sodium: Adds salt to all meals.    Physical activity: Walking up to 60 minutes 7 days/week.      Objective:     Most Recent BP Reading(s)  10/08/20 : 136/82  09/27/20 : 136/81  09/21/20 : 167/86  07/26/20 : 121/91  12/27/18 : 137/84    The 10-year ASCVD risk score Mikey Bussing DC Jr., et al., 2013) is: 5.4%    Values used to calculate the score:      Age: 71 years      Sex: Female      Is Non-Hispanic African American: No      Diabetic: No      Tobacco smoker: No      Systolic Blood Pressure: 458 mmHg      Is BP treated: No      HDL Cholesterol: 56 mg/dL      Total Cholesterol: 190 mg/dL    ASSESSMENT/RECOMMENDATIONS    1. Patient's BP cuff has not been calibrated in clinic yet. Patient in need of in-person visit for SMBP machine correlation. In the meantime, advised patient to continue testing BP daily and record in log for follow-up.    2.   Blood Pressure:   a. Last BP was above goal (<130/80 per ACC/AHA guidelines).  b. Assessment: Unable to assess due to nature of visit.   c. Intervention:  i. Medication:  1. Continue losartan 50 mg tablets -  2 tablets daily (TDD: 100 mg)  2. Continue hydrochlorothiazide 25 mg daily.  3. Potential next steps:  a. Switching hydrochlorothiazide to chlorthalidone.   b. Adding CCB.   c. Limiting pill burden. Switch losartan tablet from 50 mg to 100 mg tablet. If staying on both losartan and hydrochlorothiazide consider combination tablet of 100/25 mg daily.  ii. Diet/Exercise:    Discussed impact of salt on blood pressure. Advised patient to cut back on adding salt to meals. Discussed trying other spices in place of salt (e.g. garlic, pepper)     3.  Medication Reconciliation  d. Medication reconciliation was completed today verbally.  e. Refills submitted: None. Patient confirms picking up all medication refills last week.    Conditions addressed during this visit: Hypertension/Elevated BP  Follow-up scheduled with clinical pharmacist. Patient prefers [facility]    Encouraged patient to keep scheduled pharmacotherapy visits and to call site pharmacist with questions.

## 2020-10-15 ENCOUNTER — Ambulatory Visit: Payer: 344 | Attending: Internal Medicine

## 2020-10-15 ENCOUNTER — Other Ambulatory Visit: Payer: Self-pay

## 2020-10-15 DIAGNOSIS — Z23 Encounter for immunization: Secondary | ICD-10-CM | POA: Insufficient documentation

## 2020-10-19 ENCOUNTER — Ambulatory Visit (HOSPITAL_BASED_OUTPATIENT_CLINIC_OR_DEPARTMENT_OTHER): Payer: No Typology Code available for payment source | Admitting: Internal Medicine

## 2020-10-28 ENCOUNTER — Other Ambulatory Visit: Payer: Self-pay

## 2020-10-28 ENCOUNTER — Ambulatory Visit: Payer: No Typology Code available for payment source | Attending: Internal Medicine | Admitting: Internal Medicine

## 2020-10-28 ENCOUNTER — Encounter (HOSPITAL_BASED_OUTPATIENT_CLINIC_OR_DEPARTMENT_OTHER): Payer: Self-pay | Admitting: Internal Medicine

## 2020-10-28 VITALS — BP 157/59 | HR 63 | Temp 98.4°F | Wt 194.4 lb

## 2020-10-28 DIAGNOSIS — Z129 Encounter for screening for malignant neoplasm, site unspecified: Secondary | ICD-10-CM | POA: Diagnosis present

## 2020-10-28 DIAGNOSIS — Z7185 Encounter for immunization safety counseling: Secondary | ICD-10-CM | POA: Insufficient documentation

## 2020-10-28 DIAGNOSIS — I119 Hypertensive heart disease without heart failure: Secondary | ICD-10-CM | POA: Insufficient documentation

## 2020-10-28 DIAGNOSIS — F411 Generalized anxiety disorder: Secondary | ICD-10-CM | POA: Diagnosis present

## 2020-10-28 DIAGNOSIS — E559 Vitamin D deficiency, unspecified: Secondary | ICD-10-CM | POA: Diagnosis present

## 2020-10-28 DIAGNOSIS — R1084 Generalized abdominal pain: Secondary | ICD-10-CM

## 2020-10-28 MED ORDER — ESCITALOPRAM OXALATE 20 MG PO TABS
20.00 mg | ORAL_TABLET | Freq: Every day | ORAL | 2 refills | Status: AC
Start: 2020-10-28 — End: 2021-01-26

## 2020-10-28 MED ORDER — VITAMIN D (ERGOCALCIFEROL) 1.25 MG (50000 UT) PO CAPS
1.0000 | ORAL_CAPSULE | ORAL | 0 refills | Status: DC
Start: 2020-10-28 — End: 2021-02-11

## 2020-10-28 MED ORDER — HYDROCHLOROTHIAZIDE 25 MG PO TABS
25.0000 mg | ORAL_TABLET | Freq: Every day | ORAL | 3 refills | Status: DC
Start: 2020-10-28 — End: 2021-05-09

## 2020-10-28 MED FILL — HYDROCHLOROT 25MG: 90 days supply | Qty: 90 | Fill #0

## 2020-10-28 MED FILL — ESCITALOPRAM 20MG: 30 days supply | Qty: 30 | Fill #0

## 2020-10-28 MED FILL — VITAMIN D 50000UNT: 84 days supply | Qty: 12 | Fill #0

## 2020-10-28 NOTE — Progress Notes (Signed)
Mariah [name] is a 64 year old female, [place], to reconnect with primary care after prolonged hiatus d/t pandemic.     Interim hx:  10/5 - ED visit with rash in neck, suspicion for shingles. Rash fully resolved with rx tx (Valtrex)   10/7 - ED visit with abdominal pain, felt it was hard, also with nausea. Admitted with SBO. Had surgery, and also had hernia repaired     #)  Recounts that recovery from surgery was difficult. Had a lot of pain, for some time. Couldn't lay in bed.  Had FU visit with surgeon on 10/22.   Took Tylenol alternated with 800 mg ibuprofen.    -- For the past 5 days, has been doing a lot better. No more pain. Able to sleep in her bed.  Tolerating PO well, good appetite  No n/v  Normal BM    -- For the past few days, has noticed a bitter taste in her mouth, comes and goes.    -- Surgeon recommended a specific vitamin supplementation, she lost the paper. She's not supplementing vitamins at this point, as she should.    #) HTN  Pt reports taking medications daily. Denies chest pain, SOB, DOE, palpitations, dizziness, head aches, leg edema.   Most Recent BP Reading(s)  10/28/20 : 157/59  10/08/20 : 136/82  09/27/20 : 136/81  09/21/20 : 167/86  07/26/20 : 121/91      OBJECTIVE:  BP 157/59 (Site: LA, Position: Sitting, Cuff Size: Lrg)    Pulse 63    Temp 98.4 F (36.9 C) (Oral)    Wt 88.2 kg (194 lb 6.4 oz)    SpO2 100%    BMI 33.90 kg/m   Pleasant, in NAD  HEENT: normocephalic.  Eyes with no lesions or discharge, EOM intact. Ears with TM pearly gray, light cone reflex, canals clear bl.   Neck: supple, full ROM, no lymphadenopathy, no palpable masses on thyroid  Heart: S1 and S2 normal, no murmurs, clicks, gallops or rubs. Regular rate and rhythm.   Lungs:  clear; no wheezes, rhonchi or rales.  Abdomen: soft, bs in 4 quadrants, not distended, no guarding, no masses, no tenderness, no organomegaly, no suprapubic or epigastric pain    ASSESSMENT & PLAN:  (R10.84) Abdominal pain,  generalized  (primary encounter diagnosis)  Comment: Had episode of pain about 1 month ago. Went to ED, ended up with surgery for SBO and hernia repair. Initial pain after surgery, but no pain for the past 5d. Took ibuprofen during days of pain, and now noticed bitter taste, likely related to GERD. Good PO intake, no n/v, has normal bowel movements.  Plan: Stop pain meds if pain has resolved  Monitor sx    (I11.9) Benign hypertensive heart disease without heart failure  Comment: Mildly elevated today, reports good compliance to meds  Needs refill  Continue monitoring in subsequent visits.  Plan: hydrochlorothiazide (HYDRODIURIL) 25 MG tablet    (F41.1) Anxiety state  Comment: Doing well, better now that pain has resolved. Stopped escitalopram d/t recent events, but would like to continue. Needs refills  Plan: escitalopram (LEXAPRO) 20 MG tablet    (E55.9) Hypovitaminosis D  Comment: 22. Will supplement weekly given hx Bariatric, decreased absorption, and winter months  Plan: Vitamin D, Ergocalciferol, 1.25 MG (50000 UT)         capsule        (Z12.9) Cancer screening  Comment: Rationale for screening reviewed  Breast: due. Number provided.  Colon :  utd  Plan: [place] SCREENING MAMMO BILATERAL DIGITAL WITH DBT &        CAD     (Z71.85) Immunization counseling  Comment: Rationale for vaccination reviewed  -- Already had influenza  -- already had 3rd dose COVID  -- Due for Zoster today  -- Pneumococcal > 65  Plan: HZV ZOSTER VACC RECOMBINANT ADJUVANTED IM NJX          The patient was ready to learn and no apparent learning or adherence barriers were identified. I explained the diagnosis and treatment plan, and the patient expressed understanding of the content. I attempted to answer any questions regarding the diagnosis and the proposed treatment.    follow-up prn

## 2020-11-10 ENCOUNTER — Telehealth (HOSPITAL_BASED_OUTPATIENT_CLINIC_OR_DEPARTMENT_OTHER): Payer: Self-pay | Admitting: Pharmacist

## 2020-11-10 NOTE — Telephone Encounter (Signed)
Hypertension/Elevated BP and Outreach    Date of missed visit or date of initial referral outreach: 11/10/20    Attempt: 1st    For in person visit     Outcome:   Left message to schedule via clinic    Should pt return my call:  Please schedule with [facility] pharmacotherapy team scheduling subgroup, rx consult 30

## 2020-12-01 MED FILL — OMEPRAZOLE   20MG: 30 days supply | Qty: 30 | Fill #0 | Status: CP

## 2020-12-01 MED FILL — VITAMIN D3 1000UNIT TAB: 30 days supply | Qty: 30 | Fill #1 | Status: CP

## 2020-12-21 MED FILL — *LOSARTAN POT 50MG: 90 days supply | Qty: 180 | Fill #1 | Status: CP

## 2021-01-10 ENCOUNTER — Encounter (HOSPITAL_BASED_OUTPATIENT_CLINIC_OR_DEPARTMENT_OTHER): Payer: Self-pay

## 2021-01-10 ENCOUNTER — Other Ambulatory Visit: Payer: Self-pay

## 2021-01-10 ENCOUNTER — Ambulatory Visit
Admission: RE | Admit: 2021-01-10 | Discharge: 2021-01-10 | Disposition: A | Discharge status: Home / Self Care | Payer: No Typology Code available for payment source | Attending: Internal Medicine | Admitting: Internal Medicine

## 2021-01-10 DIAGNOSIS — Z1231 Encounter for screening mammogram for malignant neoplasm of breast: Secondary | ICD-10-CM | POA: Diagnosis not present

## 2021-01-10 DIAGNOSIS — Z129 Encounter for screening for malignant neoplasm, site unspecified: Secondary | ICD-10-CM

## 2021-01-11 ENCOUNTER — Other Ambulatory Visit: Payer: Self-pay

## 2021-01-11 ENCOUNTER — Ambulatory Visit: Payer: No Typology Code available for payment source | Attending: Ophthalmology | Admitting: Ophthalmology

## 2021-01-11 DIAGNOSIS — H5203 Hypermetropia, bilateral: Secondary | ICD-10-CM | POA: Diagnosis present

## 2021-01-11 DIAGNOSIS — H524 Presbyopia: Secondary | ICD-10-CM

## 2021-01-11 DIAGNOSIS — H40003 Preglaucoma, unspecified, bilateral: Secondary | ICD-10-CM | POA: Diagnosis not present

## 2021-01-11 DIAGNOSIS — H353111 Nonexudative age-related macular degeneration, right eye, early dry stage: Secondary | ICD-10-CM

## 2021-01-11 NOTE — Progress Notes (Signed)
Impression:  Mild macular mottle OD  Glaucoma suspect:  Mildly asymmetric optic nerve cupping.  No signs of neuropathy.  Normal corneal thickness, normal IOP.  Hyperopia/Presybopia    Plan:  Rx given for spectacle lenses  Observe  See 1 year

## 2021-01-11 NOTE — Progress Notes (Signed)
Annual eye exam, f/u Glaucoma suspect, Dermatochalasis, Hyperopia, Presbyopia.    Vision is blurry cc.    Itchy eyes.

## 2021-01-17 ENCOUNTER — Encounter (HOSPITAL_BASED_OUTPATIENT_CLINIC_OR_DEPARTMENT_OTHER): Payer: Self-pay

## 2021-01-17 NOTE — Progress Notes (Signed)
I had pleasure to see the patient arrived to order ye glasses from [facility], but the member does not have aby plan which qualifies for optical services.

## 2021-01-31 ENCOUNTER — Telehealth (HOSPITAL_BASED_OUTPATIENT_CLINIC_OR_DEPARTMENT_OTHER): Payer: Self-pay

## 2021-01-31 ENCOUNTER — Encounter (HOSPITAL_BASED_OUTPATIENT_CLINIC_OR_DEPARTMENT_OTHER): Payer: Self-pay

## 2021-01-31 NOTE — Telephone Encounter (Signed)
Hypertension/Elevated BP and Outreach    Date of missed visit or date of initial referral outreach: 11/10/20    Attempt: 2nd    For in person visit     Outcome:   Left message to schedule via clinic    Should pt return my call:  Please schedule with [facility] pharmacotherapy scheduling subgroup, rx consult 30    Note: to see future outreach attempts associated with this initial referral, please see communications section of referral

## 2021-02-03 ENCOUNTER — Encounter (HOSPITAL_BASED_OUTPATIENT_CLINIC_OR_DEPARTMENT_OTHER): Payer: Self-pay | Admitting: Internal Medicine

## 2021-02-03 NOTE — Progress Notes (Deleted)
Mariah [name] is a 65 year old female, [place], hx HTN, obesity, anxiety. We last met in Nov/21  She was referred to pharmacotherapy to help control her BP, but pharm has not been able to reach her. Letter sent earlier this wk.    Today w/ c/o:

## 2021-02-23 ENCOUNTER — Institutional Professional Consult (permissible substitution) (HOSPITAL_BASED_OUTPATIENT_CLINIC_OR_DEPARTMENT_OTHER): Payer: Self-pay

## 2021-03-01 MED FILL — VITAMIN D 50000UNT: 84 days supply | Qty: 12 | Fill #0

## 2021-03-03 ENCOUNTER — Telehealth (HOSPITAL_BASED_OUTPATIENT_CLINIC_OR_DEPARTMENT_OTHER): Payer: Self-pay

## 2021-03-03 LAB — COMPREHENSIVE METABOLIC PANEL
ALANINE AMINOTRANSFERASE: 52 U/L — ABNORMAL HIGH (ref 12–45)
ALBUMIN: 4.2 g/dL (ref 3.4–5.0)
ALKALINE PHOSPHATASE: 212 U/L — ABNORMAL HIGH (ref 45–117)
ANION GAP: 12 mmol/L (ref 5–15)
ASPARTATE AMINOTRANSFERASE: 33 U/L (ref 8–34)
BILIRUBIN TOTAL: 0.4 mg/dL (ref 0.2–1.0)
BUN (UREA NITROGEN): 24 mg/dL — ABNORMAL HIGH (ref 7–18)
CALCIUM: 9.7 mg/dL (ref 8.5–10.1)
CARBON DIOXIDE: 27 mmol/L (ref 21–32)
CHLORIDE: 103 mmol/L (ref 98–107)
CREATININE: 0.8 mg/dL (ref 0.4–1.2)
ESTIMATED GLOMERULAR FILT RATE: >60 mL/min (ref >60)
Glucose Random: 90 mg/dL (ref 74–160)
POTASSIUM: 4.6 mmol/L (ref 3.5–5.1)
SODIUM: 142 mmol/L (ref 136–145)
TOTAL PROTEIN: 8.1 g/dL (ref 6.4–8.2)

## 2021-03-03 LAB — VITAMIN B12: VITAMIN B12: 807 pg/mL (ref 193–986)

## 2021-03-03 LAB — FOLATE: FOLATE: 11.1 ng/mL (ref 3.0–?)

## 2021-03-03 LAB — VITAMIN D,25 HYDROXY: VITAMIN D,25 HYDROXY: 22 ng/mL — ABNORMAL LOW (ref 30.0–100.0)

## 2021-03-03 NOTE — Telephone Encounter (Signed)
Outreach    Date of missed visit or date of initial referral outreach: 03/03/21    Attempt: 1st    For in person visit     Outcome:   Left message to schedule via clinic    Should pt return my call:  Please schedule with [facility] pharmacotherapy scheduling subgroup, rx consult 30

## 2021-05-09 ENCOUNTER — Other Ambulatory Visit: Payer: Self-pay

## 2021-05-09 ENCOUNTER — Ambulatory Visit: Payer: No Typology Code available for payment source | Attending: Internal Medicine | Admitting: Internal Medicine

## 2021-05-09 ENCOUNTER — Encounter (HOSPITAL_BASED_OUTPATIENT_CLINIC_OR_DEPARTMENT_OTHER): Payer: Self-pay | Admitting: Internal Medicine

## 2021-05-09 VITALS — BP 123/74 | HR 72 | Temp 97.5°F | Wt 205.0 lb

## 2021-05-09 DIAGNOSIS — R5383 Other fatigue: Secondary | ICD-10-CM | POA: Insufficient documentation

## 2021-05-09 DIAGNOSIS — Z9884 Bariatric surgery status: Secondary | ICD-10-CM | POA: Insufficient documentation

## 2021-05-09 DIAGNOSIS — Z7185 Encounter for immunization safety counseling: Secondary | ICD-10-CM | POA: Diagnosis present

## 2021-05-09 DIAGNOSIS — I119 Hypertensive heart disease without heart failure: Secondary | ICD-10-CM | POA: Diagnosis present

## 2021-05-09 DIAGNOSIS — R918 Other nonspecific abnormal finding of lung field: Secondary | ICD-10-CM | POA: Insufficient documentation

## 2021-05-09 DIAGNOSIS — Z1321 Encounter for screening for nutritional disorder: Secondary | ICD-10-CM | POA: Diagnosis present

## 2021-05-09 DIAGNOSIS — Z13228 Encounter for screening for other metabolic disorders: Secondary | ICD-10-CM | POA: Insufficient documentation

## 2021-05-09 DIAGNOSIS — R635 Abnormal weight gain: Secondary | ICD-10-CM | POA: Insufficient documentation

## 2021-05-09 LAB — CBC, PLATELET & DIFFERENTIAL
ABSOLUTE BASO COUNT: 0.1 10*3/uL (ref 0.0–0.1)
ABSOLUTE EOSINOPHIL COUNT: 0.1 10*3/uL (ref 0.0–0.8)
ABSOLUTE IMM GRAN COUNT: 0.03 10*3/uL (ref 0.00–0.03)
ABSOLUTE LYMPH COUNT: 2.1 10*3/uL (ref 0.6–5.9)
ABSOLUTE MONO COUNT: 0.4 10*3/uL (ref 0.2–1.4)
ABSOLUTE NEUTROPHIL COUNT: 5 10*3/uL (ref 1.6–8.3)
ABSOLUTE NRBC COUNT: 0 10*3/uL (ref 0.0–0.0)
BASOPHIL %: 0.7 % (ref 0.0–1.2)
EOSINOPHIL %: 1.2 % (ref 0.0–7.0)
HEMATOCRIT: 40.4 % (ref 34.1–44.9)
HEMOGLOBIN: 12.2 g/dL (ref 11.2–15.7)
IMMATURE GRANULOCYTE %: 0.4 % (ref 0.0–0.4)
LYMPHOCYTE %: 27.1 % (ref 15.0–54.0)
MEAN CORP HGB CONC: 30.2 g/dL — ABNORMAL LOW (ref 31.0–37.0)
MEAN CORPUSCULAR HGB: 27.1 pg (ref 26.0–34.0)
MEAN CORPUSCULAR VOL: 89.6 fl (ref 80.0–100.0)
MEAN PLATELET VOLUME: 9.8 fL (ref 8.7–12.5)
MONOCYTE %: 5.8 % (ref 4.0–13.0)
NEUTROPHIL %: 64.8 % (ref 40.0–75.0)
NRBC %: 0 % (ref 0.0–0.0)
PLATELET COUNT: 341 10*3/uL (ref 150–400)
RBC DISTRIBUTION WIDTH STD DEV: 45.8 fL (ref 35.1–46.3)
RED BLOOD CELL COUNT: 4.51 M/uL (ref 3.90–5.20)
WHITE BLOOD CELL COUNT: 7.6 10*3/uL (ref 4.0–11.0)

## 2021-05-09 LAB — COMPREHENSIVE METABOLIC PANEL
ALANINE AMINOTRANSFERASE: 30 U/L (ref 12–45)
ALBUMIN: 5 g/dL (ref 3.4–5.2)
ALKALINE PHOSPHATASE: 169 U/L — ABNORMAL HIGH (ref 45–117)
ANION GAP: 13 mmol/L (ref 10–22)
ASPARTATE AMINOTRANSFERASE: 29 U/L (ref 8–34)
BILIRUBIN TOTAL: 0.2 mg/dL (ref 0.2–1.0)
BUN (UREA NITROGEN): 16 mg/dL (ref 7–18)
CALCIUM: 10.2 mg/dl — ABNORMAL HIGH (ref 8.5–10.1)
CARBON DIOXIDE: 26 mmol/L (ref 21–32)
CHLORIDE: 103 mmol/L (ref 98–107)
CREATININE: 0.7 mg/dL (ref 0.4–1.2)
ESTIMATED GLOMERULAR FILT RATE: >60 mL/min (ref >60)
Glucose Random: 86 mg/dL (ref 74–160)
POTASSIUM: 4.4 mmol/L (ref 3.5–5.1)
SODIUM: 143 mmol/L (ref 136–145)
TOTAL PROTEIN: 7.7 g/dL (ref 6.4–8.2)

## 2021-05-09 LAB — HEMOGLOBIN A1C
ESTIMATED AVERAGE GLUCOSE: 120 mg/dL (ref 74–160)
HEMOGLOBIN A1C: 5.8 % — ABNORMAL HIGH (ref 4.0–5.6)

## 2021-05-09 LAB — VITAMIN B12: VITAMIN B12: 587 pg/mL (ref 232–1245)

## 2021-05-09 LAB — FERRITIN: FERRITIN: 69 ng/mL (ref 13–150)

## 2021-05-09 LAB — FOLATE: FOLATE: 9.2 ng/mL (ref 4.6–?)

## 2021-05-09 LAB — VITAMIN D,25 HYDROXY: VITAMIN D,25 HYDROXY: 28 ng/mL — ABNORMAL LOW (ref 30.0–100.0)

## 2021-05-09 MED ORDER — LOSARTAN POTASSIUM 50 MG PO TABS
100.0000 mg | ORAL_TABLET | Freq: Every day | ORAL | 3 refills | Status: DC
Start: 2021-05-09 — End: 2022-05-16

## 2021-05-09 MED ORDER — HYDROCHLOROTHIAZIDE 25 MG PO TABS
25.0000 mg | ORAL_TABLET | Freq: Every day | ORAL | 3 refills | Status: DC
Start: 2021-05-09 — End: 2022-03-07

## 2021-05-09 MED FILL — HYDROCHLOROTHIAZIDE 25MG: 90 days supply | Qty: 90 | Fill #0

## 2021-05-09 MED FILL — LOSARTAN POT 50MG: 90 days supply | Qty: 180 | Fill #0

## 2021-05-09 NOTE — Progress Notes (Signed)
Mariah [name] is a 65 year old female, [place], I have last seen in Nov/21    c/o  Knee pain and low back pain  Doing accupunctre    #) Lack of energy  Sometimes she does not want to do anything, this is very unusual for her  Ongoing for about 6 months, getting worse  Does not feel depressed, feels tired, even without doing anything  Never felt this way before  Hx of bariatric surgery approx 4 ys ago, has not had vitamins checked in some time  She's not supplementing  Vitamins, except vit D  Wonders if she could have anemia    #) Admits to increasing food portions  Eats mostly at night, 2-3 servings  Rice, beans, meat  Not too many sweets  Has gained wt  Recently started walking again, daily    Most Recent Weight Reading(s)  05/09/21 : 93 kg (205 lb)  01/10/21 : 88 kg (194 lb)  10/28/20 : 88.2 kg (194 lb 6.4 oz)  10/08/20 : 87.1 kg (192 lb)  09/23/20 : 88 kg (194 lb)    #) HTN  Pt reports taking medications daily- Losartan + HCTZ. Denies chest pain, SOB, DOE, palpitations, dizziness, head aches, leg edema. Checks BP at home, "it's been well controlled"    Most Recent BP Reading(s)  05/09/21 : 123/74  10/28/20 : 157/59  10/08/20 : 136/82  09/27/20 : 136/81  09/21/20 : 167/86      OBJECTIVE:  BP 123/74    Pulse 72    Temp 97.5 F (36.4 C) (Temporal)    Wt 93 kg (205 lb)    SpO2 98%    BMI 35.74 kg/m   Pleasant, in NAD  HEENT: normocephalic.  Eyes with no lesions or discharge, EOM intact. Ears with TM pearly gray, light cone reflex, canals clear bl. Nose patent, with scant clear rhinorrhea. Oropharynx mucosa pink and moist, teeth in good repair, uvula midline, tonsils 1+ bl with no exudates.  Neck: supple, full ROM, no lymphadenopathy, no palpable masses on thyroid  Heart: S1 and S2 normal, no murmurs, clicks, gallops or rubs. Regular rate and rhythm.   Lungs:  clear; no wheezes, rhonchi or rales.      ASSESSMENT & PLAN:  (I11.9) Benign hypertensive heart disease without heart failure  Comment: well  controlled on current regimen, asymptomatic  Plan: losartan (COZAAR) 50 MG tablet,         hydrochlorothiazide (HYDRODIURIL) 25 MG tablet          (R63.5) Weight gain  (A41.660) Screening for metabolic disease  Comment: 11 lb gain since January, pt admits to increased portions. She may be eating more than she's perceiving. R/o diabetes.   Consider re-connecting with team that did bariatric surgery at [facility], or if not approved d/t insurance coverage, consider [facility] referral.   Wt gain may be contributing to present lack of energy  Plan: HEMOGLOBIN A1C    (R53.83) Lack of energy (primary diagnosis)  (Z98.84) S/P bariatric surgery  (Z13.21) screening vitamin deficiency  Comment:   Plan: VITAMIN B1, WHOLE BLOOD, VITAMIN B12, FOLATE,         FERRITIN, VITAMIN A, ZINC        VITAMIN D,25 HYDROXY, COMPREHENSIVE METABOLIC         PANEL, CBC, PLATELET & DIFFERENTIAL    (Z71.85) Immunization counseling  Comment: needs covid booster  Plan: PFIZER COVID-19 VACCINE (GRAY CAP)          (  R91.8) Abnormal CT lung screening  Comment: needs FU nodules + FU screening, former smoker  Plan: CT LUNG SCREENING FOLLOW UP    The patient was ready to learn and no apparent learning or adherence barriers were identified. I explained the diagnosis and treatment plan, and the patient expressed understanding of the content. I attempted to answer any questions regarding the diagnosis and the proposed treatment.    Possible side effects of the prescribed medication was explained. We discussed the patients current medications.  We discussed the importance of medication compliance. The patient expressed understanding and no barriers to adherence were identified.    follow-up per lab results  she has been advised to call or return with any worsening or new problems      I spent a total of 37 minutes on this visit on the date of service (total time includes all activities performed on the date of service)

## 2021-05-09 NOTE — Progress Notes (Signed)
Covid-19 Vaccine Administration:    Patient screened according to guidelines. Vaccine Information Fact Sheet given prior to administration and all patient/parent questions addressed. Covid-19 vaccine given without incident.  Possible side effects reviewed.  Return to clinic for next scheduled dose if indicated.      Bowden Boody B Carnisha Feltz, RN

## 2021-05-23 LAB — VITAMIN B1, WHOLE BLOOD: VITAMIN B1, WHOLE BLOOD: 109.2 nmol/L (ref 66.5–200.0)

## 2021-06-08 ENCOUNTER — Telehealth (HOSPITAL_BASED_OUTPATIENT_CLINIC_OR_DEPARTMENT_OTHER): Payer: Self-pay | Admitting: Internal Medicine

## 2021-06-08 DIAGNOSIS — E559 Vitamin D deficiency, unspecified: Secondary | ICD-10-CM

## 2021-06-08 DIAGNOSIS — R5383 Other fatigue: Secondary | ICD-10-CM

## 2021-06-08 MED ORDER — CHOLECALCIFEROL 25 MCG (1000 UT) PO TABS
1000.0000 [IU] | ORAL_TABLET | Freq: Every day | ORAL | 11 refills | Status: DC
Start: 2021-06-08 — End: 2022-08-29

## 2021-06-08 MED FILL — VITAMIN D3 1000UNIT: 30 days supply | Qty: 30 | Fill #0

## 2021-06-08 NOTE — Telephone Encounter (Signed)
Mariah [name] is a 65 year old old female.    Patient's PCP: Lawernce Pitts, APRN    In case we get disconnected what is the best number to reach you at today:  Home Phone [phone] (home)    Person calling:  Patient (self)    How can I help you today:   Patient is calling for lab results done on 05/23    Patient's language of care: [place]    Would you like an interpreter when the nurse calls you back?  NO

## 2021-06-08 NOTE — Telephone Encounter (Signed)
Sorry for the delay  Labs are all pretty much OK:    Component      Latest Ref Rng & Units 05/09/2021   WHITE BLOOD CELL COUNT      4.0 - 11.0 TH/uL 7.6   RED BLOOD CELL COUNT      3.90 - 5.20 M/uL 4.51   HEMOGLOBIN      11.2 - 15.7 g/dL 12.2   HEMATOCRIT      34.1 - 44.9 % 40.4   MEAN CORPUSCULAR VOL      80.0 - 100.0 fl 89.6   MEAN CORPUSCULAR HGB      26.0 - 34.0 pg 27.1   MEAN CORP HGB CONC      31.0 - 37.0 g/dL 30.2 (L)   RBC DISTRIBUTION WIDTH STD DEV      35.1 - 46.3 fL 45.8   PLATELET COUNT      150 - 400 TH/uL 341   MEAN PLATELET VOLUME      8.7 - 12.5 fL 9.8   NEUTROPHIL %      40.0 - 75.0 % 64.8   IMMATURE GRANULOCYTE %      0.0 - 0.4 % 0.4   LYMPHOCYTE %      15.0 - 54.0 % 27.1   MONOCYTE %      4.0 - 13.0 % 5.8   EOSINOPHIL %      0.0 - 7.0 % 1.2   BASOPHIL %      0.0 - 1.2 % 0.7   NRBC %      0.0 - 0.0 % 0.0   ABSOLUTE NEUTROPHIL COUNT      1.6 - 8.3 TH/uL 5.0   ABSOLUTE IMM GRAN COUNT      0.00 - 0.03 TH/uL 0.03   ABSOLUTE LYMPH COUNT      0.6 - 5.9 TH/uL 2.1   ABSOLUTE MONO COUNT      0.2 - 1.4 TH/uL 0.4   ABSOLUTE EOSINOPHIL COUNT      0.0 - 0.8 TH/uL 0.1   ABSOLUTE BASO COUNT      0.0 - 0.1 TH/uL 0.1   ABSOLUTE NRBC COUNT      0.0 - 0.0 TH/uL 0.0   SODIUM      136 - 145 mmol/L 143   POTASSIUM      3.5 - 5.1 mmol/L 4.4   CHLORIDE      98 - 107 mmol/L 103   CARBON DIOXIDE      21 - 32 mmol/L 26   ANION GAP      10 - 22 mmol/L 13   CALCIUM      8.5 - 10.1 mg/dl 10.2 (H)   Glucose Random      74 - 160 mg/dL 86   BUN (UREA NITROGEN)      7 - 18 mg/dL 16   TOTAL PROTEIN      6.4 - 8.2 g/dL 7.7   ALBUMIN      3.4 - 5.2 g/dL 5.0   BILIRUBIN TOTAL      0.2 - 1.0 mg/dL 0.2   ALKALINE PHOSPHATASE      45 - 117 U/L 169 (H)   ASPARTATE AMINOTRANSFERASE      8 - 34 U/L 29   CREATININE      0.4 - 1.2 mg/dL 0.7   ESTIMATED GLOMERULAR FILT RATE      >60 ML/MIN > 60   ALANINE AMINOTRANSFERASE      12 -  45 U/L 30   HEMOGLOBIN A1C      4.0 - 5.6 % 5.8 (H)   ESTIMATED AVERAGE GLUCOSE      74 - 160 mg/dL 120   VITAMIN  D,25 HYDROXY      30.0 - 100.0 ng/mL 28 (L)   VITAMIN B1, WHOLE BLOOD      66.5 - 200.0 nmol/L 109.2   VITAMIN B12      232 - 1245 pg/mL 587   FOLATE      4.6- ng/mL 9.2   FERRITIN      13 - 150 ng/mL 69       Complete blood count is essentially normal, no signs of infection  Anemia studies (iron, ferritin, B12) - all normal  Vitamins (B12, B1) normal    Vit D is a little low. She should continue taking vit D supplementation. I will renew her Rx.    No diabetes, but A1c is slightly elevated. It means she's "pre-diabetic". No need for meds. Care with food intake (limit carbohydrates, sweets), and regular physical activity can help.    Other minimal variations that would without clinical effect.     I remember she mentioned feeling very tired and low energy  I couldn't identify anything in these tests to justify it  I'd encourage her to focus on a healthy life style: aim for 8h of uninterrupted sleep, regular healthy meals (low carbs, low sugars), regular physical activity, good hydration.     Let me know if anything changes!    Verdis Frederickson

## 2021-06-10 ENCOUNTER — Other Ambulatory Visit: Payer: Self-pay

## 2021-06-10 ENCOUNTER — Ambulatory Visit
Admission: RE | Admit: 2021-06-10 | Discharge: 2021-06-10 | Disposition: A | Discharge status: Home / Self Care | Payer: No Typology Code available for payment source | Attending: Internal Medicine | Admitting: Internal Medicine

## 2021-06-10 ENCOUNTER — Encounter (HOSPITAL_BASED_OUTPATIENT_CLINIC_OR_DEPARTMENT_OTHER): Payer: Self-pay | Admitting: Internal Medicine

## 2021-06-10 DIAGNOSIS — R918 Other nonspecific abnormal finding of lung field: Secondary | ICD-10-CM

## 2021-06-10 DIAGNOSIS — Z87891 Personal history of nicotine dependence: Secondary | ICD-10-CM

## 2021-06-10 NOTE — Telephone Encounter (Signed)
Called pt via [place] interpretor ID # [id number]  Confirmed name and dob of pt  Reiterated PCP message     Harrison Mons, RN 06/10/21 1:03 PM

## 2021-06-23 MED FILL — ESCITALOPRAM 20MG: 30 days supply | Qty: 30 | Fill #0

## 2021-06-23 MED FILL — VITAMIN D3 1000UNIT: 30 days supply | Qty: 30 | Fill #0

## 2021-06-28 ENCOUNTER — Ambulatory Visit (HOSPITAL_BASED_OUTPATIENT_CLINIC_OR_DEPARTMENT_OTHER): Payer: No Typology Code available for payment source | Admitting: Internal Medicine

## 2021-07-27 ENCOUNTER — Ambulatory Visit (HOSPITAL_BASED_OUTPATIENT_CLINIC_OR_DEPARTMENT_OTHER): Payer: Self-pay | Admitting: Registered Nurse

## 2021-07-27 NOTE — Telephone Encounter (Signed)
[place] Interpreter: [phone]    Situation:  Patient phone triaged today for abd.pain     Background:     Review of Patient's Allergies indicates:   Lisinopril              Cough   Heparin                 Rash    Comment:local rash seen during hospitalization Feb 2008             Received lovenox during hospitalization Oct 2021             w/o issue    Patient Active Problem List:     History of bariatric surgery     LATERAL FOOT PAIN     Benign hypertensive heart disease without heart failure     Alkaline phosphatase elevation     Calculus of kidney     Gastritis     Allergic rhinitis due to other allergen     Osteoporosis     Murmur heart     Family history of throat cancer     Vitamin D deficiency     Subclavian artery stenosis, left (HCC)     Schatzki's ring     Duodenal ulcer, unspecified as acute or chronic, without hemorrhage, perforation, or obstruction     Mild depression ([place])     Glaucoma suspect of both eyes     Hyperopia with presbyopia of both eyes     Abnormal CT lung screening     Elevated LFTs     COVID-19 virus infection     SBO (small bowel obstruction) ([place])    Assessment/Action:    "Everything I eat causes nausea and stomach pain starts slowly even drinking water is causing pain"    Reports she always has nauseating pain in her stomach but for the past 3 days worsening pain with eating. Always has pain but worse with eating.     Above umbilicus moderate difficulty explaining pain location    Took tylenol not long ago and describes pain 8/10 before taking it    5 years ago reports she had bariatric surgery. Tylenol & Ibuprofen at home. Advised not to take Ibuprofen pt reports she is aware and hasn't taken it.      Denies CP, SOB, vomiting, diarrhea, constipation, or urine problems, injury/trauma, fall    Response/Recommendation:  Agreeable to ACC/UC 8/11  Advised to seek immediate eval for worsening pain, vomiting, emesis/blood, fever/chills, SOB, develop CP, weakness, incontinence, difficulty  performing ADLs, palpitations or any worsening s/sx.

## 2021-07-27 NOTE — Telephone Encounter (Signed)
Regarding: Stomach pain   ----- Message from Dorris [name] sent at 07/27/2021  9:49 AM EDT -----  Mariah [name] [id number], 65 year old, female    Calls today:  Sick    What are the symptoms stomach pain for two days.   How long has patient been sick?   What has pt. tried at home   Person calling on behalf of patient: Patient (self)    CALL BACK NUMBER: [phone]  Best time to call back:   Cell phone:   Other phone:    Patient's language of care: [place]    Patient needs a [place] interpreter.    Patient's PCP: Lawernce Pitts, APRN

## 2021-07-28 ENCOUNTER — Ambulatory Visit: Payer: No Typology Code available for payment source | Attending: Family Medicine | Admitting: Medical

## 2021-07-28 DIAGNOSIS — R1084 Generalized abdominal pain: Secondary | ICD-10-CM | POA: Insufficient documentation

## 2021-07-28 MED ORDER — FAMOTIDINE 40 MG PO TABS
40.0000 mg | ORAL_TABLET | Freq: Two times a day (BID) | ORAL | 0 refills | Status: DC
Start: 2021-07-28 — End: 2021-08-23

## 2021-07-28 MED FILL — FAMOTIDINE  40MG: 14 days supply | Qty: 28 | Fill #0

## 2021-07-28 NOTE — Progress Notes (Signed)
[facility]  Televisit Note   Subjective:   CC: Mariah [name] is a 65 year old female patient who presents for televisit today for abdominal pain    Telephone interpreter services used for visit    #Abdominal pain  - nausea and pain after eating   - has had the pain for a while, maybe weeks, got worse on Sunday  - has not had reflux in the past  - does not radiate  - located above belly button   - soup is the only thing that she can eat   - tylenol helped some   - no blood in stool  - no vomiting   - no diarrhea  - having soft stools, normal  - no recent travel    H/o SBO last year   H/o bariatric surgery 5 y ago   Last saw GI 2016    CMP 04/2021     ROS:  Per HPI   Objective:   No vitals for this encounter due to televisit during covid 19 pandemic     Speaking in full sentences, no distress     Assessment & Plan:   1. Generalized abdominal pain  65 y/o F with h/o SBO and bariatric surgery with few weeks of mild abdominal pain that worsened on Sunday. Pain is only after eating. No warning signs for acute abdomen identified on the phone, but will ask that her in person visit gets rescheduled. No travel to suggest H. Pylori. Seems c/w gastritis/ GERD.   - trial famotidine BID x 2 weeks   - Discussed bland diet  - Discussed precautions to call or go to ED with fever, blood in stool, hematemesis  Pt very much wants in person visit - routing to be scheduled next week     We discussed the patients current medications. The patient expressed understanding and no barriers to adherence were identified.  The patient was given an AVS  [employer], PA-C 07/28/2021

## 2021-08-05 ENCOUNTER — Telehealth (HOSPITAL_BASED_OUTPATIENT_CLINIC_OR_DEPARTMENT_OTHER): Payer: Self-pay

## 2021-08-05 NOTE — Telephone Encounter (Signed)
Called pt regards of scheduling a appointment 1-2 weeks for abdominal pain, left message on voicemail to call back

## 2021-08-23 ENCOUNTER — Ambulatory Visit: Payer: No Typology Code available for payment source | Attending: Internal Medicine | Admitting: Internal Medicine

## 2021-08-23 DIAGNOSIS — R52 Pain, unspecified: Secondary | ICD-10-CM | POA: Insufficient documentation

## 2021-08-23 DIAGNOSIS — R1084 Generalized abdominal pain: Secondary | ICD-10-CM | POA: Insufficient documentation

## 2021-08-23 DIAGNOSIS — G479 Sleep disorder, unspecified: Secondary | ICD-10-CM | POA: Insufficient documentation

## 2021-08-23 DIAGNOSIS — R413 Other amnesia: Secondary | ICD-10-CM | POA: Insufficient documentation

## 2021-08-23 MED ORDER — FAMOTIDINE 40 MG PO TABS
40.0000 mg | ORAL_TABLET | Freq: Two times a day (BID) | ORAL | 0 refills | Status: DC
Start: 2021-08-23 — End: 2021-10-06

## 2021-08-23 MED FILL — FAMOTIDINE  40MG: 30 days supply | Qty: 60 | Fill #0

## 2021-08-23 NOTE — Progress Notes (Signed)
Mariah [name] is a 65 year old female, [place], for leg pain    EMR review shows no recent visits with similar complaint. Pt had televisit in August for abdominal pain.     HM - due for zoster, influenza and HCP    Able to reach pt via video. Pt with c/o    #)  stomach pain  Does not feel like heartburn, "horrible pain"  Pain was constant, but acute episode was about 20-30 days ago  However, she continues with milder pain, usually after eating  She continues taking famotidine BID    #) She'd like a medication for her memory  She's concerned she may have dementia  She has 2 aunts had (?) Alzheimer's  - they were in their 55's  She may go to a room and forget what she was doing. Even if she retraces her paces, she does not remember  She forgets what she puts in the refrigerator  She denies forgetting names of close relatives  Does not forget to pay bills  Sometimes she forgets where she's driving to  Many times she forgets where she places things      -- She denies major stressors  -- She denies problems sleeping, she takes a medication from [place] (can't recall the name). Without this medication she tosses and turns  She tried OTC sleep aids but does not find them helpful (melatonin, tylenol pm)  She denies substance use  Says she snores. No witnessed apnea    #) L arm pain. Last week it was constant. She took analgesia, and it improved.  Currently not working. Stays, home, watches TV  She goes for walks 2-3x wk for 1.5 h at a time. No other exercise.     OBJECTIVE:  VS and PE deferred d/t COVID pandemic  Pt speaking in full sentences in NAD    ASSESSMENT & PLAN:  (R10.84) Generalized abdominal pain  (primary encounter diagnosis)  Comment: Had acute episode approx 1 month ago. Had good response to famotidine, continues with pain, but it's now mild.   -- Continue famotidine BID for another 4 wk  -- watch for triggers. Avoid NSAIDs  Plan: famotidine (PEPCID) 40 MG tablet          (R52) Body  aches  Comment: Intermittent pains. Currently not working, sedentary life. Takes NSAID prn with good effect  Plan: Caution with nsaids d/t epigastric pain  -- Regular physical activity encouraged, esp aquaerobics, yoga, pilates, taichi  -- Monitor    (R41.3) Memory changes  Comment: Notices forgetfulness, concern for FHx Alzheimer's  Plan: Neuropsych testing.     (G47.9) Difficulty sleeping  Comment: Currently takes a medication from [place], finds it effective. Can't recall the name, will let me know.  Plan: Sleep hygiene reviewed in detail    The patient was ready to learn and no apparent learning or adherence barriers were identified. I explained the diagnosis and treatment plan, and the patient expressed understanding of the content. I attempted to answer any questions regarding the diagnosis and the proposed treatment.    follow-up prn  she has been advised to call or return with any worsening or new problems

## 2021-09-06 MED FILL — FAMOTIDINE  40MG: 30 days supply | Qty: 60 | Fill #0

## 2021-09-06 MED FILL — HYDROCHLOROTHIAZIDE 25MG: 90 days supply | Qty: 90 | Fill #1

## 2021-09-06 MED FILL — LOSARTAN POT 50MG: 90 days supply | Qty: 180 | Fill #1

## 2021-09-13 ENCOUNTER — Encounter (HOSPITAL_BASED_OUTPATIENT_CLINIC_OR_DEPARTMENT_OTHER): Payer: Self-pay

## 2021-09-13 ENCOUNTER — Emergency Department
Admission: EM | Admit: 2021-09-13 | Discharge: 2021-09-13 | Disposition: A | Discharge status: Home / Self Care | Payer: No Typology Code available for payment source | Attending: Emergency Medicine | Admitting: Emergency Medicine

## 2021-09-13 DIAGNOSIS — Z87891 Personal history of nicotine dependence: Secondary | ICD-10-CM

## 2021-09-13 DIAGNOSIS — R110 Nausea: Secondary | ICD-10-CM

## 2021-09-13 DIAGNOSIS — R519 Headache, unspecified: Secondary | ICD-10-CM

## 2021-09-13 DIAGNOSIS — R030 Elevated blood-pressure reading, without diagnosis of hypertension: Secondary | ICD-10-CM

## 2021-09-13 DIAGNOSIS — R42 Dizziness and giddiness: Secondary | ICD-10-CM

## 2021-09-13 DIAGNOSIS — I10 Essential (primary) hypertension: Secondary | ICD-10-CM | POA: Diagnosis present

## 2021-09-13 LAB — BASIC METABOLIC PANEL
ANION GAP: 10 mmol/L (ref 10–22)
BUN (UREA NITROGEN): 15 mg/dL (ref 7–18)
CALCIUM: 9.8 mg/dl (ref 8.5–10.1)
CARBON DIOXIDE: 28 mmol/L (ref 21–32)
CHLORIDE: 102 mmol/L (ref 98–107)
CREATININE: 0.8 mg/dL (ref 0.4–1.2)
ESTIMATED GLOMERULAR FILT RATE: >60 mL/min (ref >60)
Glucose Random: 88 mg/dL (ref 74–160)
POTASSIUM: 4.1 mmol/L (ref 3.5–5.1)
SODIUM: 140 mmol/L (ref 136–145)

## 2021-09-13 LAB — CBC, PLATELET & DIFFERENTIAL
ABSOLUTE BASO COUNT: 0 10*3/uL (ref 0.0–0.1)
ABSOLUTE EOSINOPHIL COUNT: 0.1 10*3/uL (ref 0.0–0.8)
ABSOLUTE IMM GRAN COUNT: 0.01 10*3/uL (ref 0.00–0.03)
ABSOLUTE LYMPH COUNT: 2.2 10*3/uL (ref 0.6–5.9)
ABSOLUTE MONO COUNT: 0.5 10*3/uL (ref 0.2–1.4)
ABSOLUTE NEUTROPHIL COUNT: 3.3 10*3/uL (ref 1.6–8.3)
ABSOLUTE NRBC COUNT: 0 10*3/uL (ref 0.0–0.0)
BASOPHIL %: 0.7 % (ref 0.0–1.2)
EOSINOPHIL %: 1.8 % (ref 0.0–7.0)
HEMATOCRIT: 38.6 % (ref 34.1–44.9)
HEMOGLOBIN: 12.1 g/dL (ref 11.2–15.7)
IMMATURE GRANULOCYTE %: 0.2 % (ref 0.0–0.4)
LYMPHOCYTE %: 35 % (ref 15.0–54.0)
MEAN CORP HGB CONC: 31.3 g/dL (ref 31.0–37.0)
MEAN CORPUSCULAR HGB: 26.4 pg (ref 26.0–34.0)
MEAN CORPUSCULAR VOL: 84.1 fl (ref 80.0–100.0)
MEAN PLATELET VOLUME: 9.2 fL (ref 8.7–12.5)
MONOCYTE %: 8.1 % (ref 4.0–13.0)
NEUTROPHIL %: 54.2 % (ref 40.0–75.0)
NRBC %: 0 % (ref 0.0–0.0)
PLATELET COUNT: 290 10*3/uL (ref 150–400)
RBC DISTRIBUTION WIDTH STD DEV: 42.6 fL (ref 35.1–46.3)
RED BLOOD CELL COUNT: 4.59 M/uL (ref 3.90–5.20)
WHITE BLOOD CELL COUNT: 6.1 10*3/uL (ref 4.0–11.0)

## 2021-09-13 LAB — TROPONIN T HS BASELINE: TROPONIN T HS BASELINE: 8 ng/L (ref 0–10)

## 2021-09-13 MED ORDER — ACETAMINOPHEN 325 MG PO TABS
650.00 mg | ORAL_TABLET | Freq: Once | ORAL | Status: AC
Start: 2021-09-13 — End: 2021-09-13
  Administered 2021-09-13: 650 mg via ORAL
  Filled 2021-09-13: qty 2

## 2021-09-13 MED ORDER — ONDANSETRON 4 MG PO TBDP
4.00 mg | ORAL_TABLET | Freq: Once | ORAL | Status: AC
Start: 2021-09-13 — End: 2021-09-13
  Administered 2021-09-13: 4 mg via ORAL
  Filled 2021-09-13: qty 1

## 2021-09-13 MED ORDER — ONDANSETRON HCL 4 MG PO TABS
4.00 mg | ORAL_TABLET | Freq: Four times a day (QID) | ORAL | 0 refills | Status: AC | PRN
Start: 2021-09-13 — End: 2021-09-18

## 2021-09-13 NOTE — Narrator Note (Signed)
Patient Disposition  Patient education for diagnosis, medications, activity, diet and follow-up.  Patient left ED 8:15 PM.  Patient rep received written instructions.    Interpreter to provide instructions: Yes; Interpreter ID: Sophie     Patient belongings with patient: YES    Have all existing LDAs been addressed? Yes    Have all IV infusions been stopped? Yes    Destination: Discharged to home pt verbalized understanding of d/c and medication instructions, amb from dept with steady gait, resp even, NAD.

## 2021-09-13 NOTE — ED Triage Note (Signed)
Pt presents ambulatory to the ED c/o hypertension. Pt reports HBP, headache, and nausea for more then a week. Pt reports hx of HBP, reports taking medication. Pt denies chest pain at this time.     BP 169/117

## 2021-09-13 NOTE — Discharge Instructions (Addendum)
Your lab testing today was reassuring.     Your blood pressure remains high.     Please use medicines to help with nausea and headache.      Follow-up with your doctor for blood pressure recheck and to see if your medicine should be changed.    Information about your visit today:    Your workup today is not complete.    Medical conditions take time to develop symptoms that can help in diagnosing you.    Please follow up with your regular doctor in a few days.     If your symptoms become severe, please seek re-evaluation from either your doctor or your local emergency department.     This may include, but is not limited to:  Chest pain  Shortness of breath  Dizziness  Nausea  Vomiting  Abdominal Pain  Or any other new or concerning symptom

## 2021-09-16 NOTE — ED Provider Notes (Signed)
ED nursing record was reviewed.  Prior records as available electronically through the Epic record were reviewed.     Patient's mode of arrival was by Self.  Arrival time:     Chief complaint: Blood Pressure      Care language: [place] Interpreter used.      Triage Vital Signs:  ED Triage Vitals [09/13/21 1731]   ED Triage Vitals Brief Group      Temp 98.1 F      Pulse 83      Resp 17      BP (!) 169/117      SpO2 96 %      Pain Score 8        Triage Documentation     Vonzell Schlatter, RN 09/13/2021 17:35             Pt presents ambulatory to the ED c/o hypertension. Pt reports HBP, headache, and nausea for more then a week. Pt reports hx of HBP, reports taking medication. Pt denies chest pain at this time.     BP 169/117             HPI:    65 year old female patient presents for feeling unwell with headache, nausea, and concerns of elevated blood pressure.  She denies any sick contacts, has been taking her prescribed medicines and denies any medication changes.  She endorses chronic shortness of breath and cough.  She took some Tylenol this morning but has not used any other medicines    ROS: Pertinent positives were reviewed as per the HPI above.    All other systems were reviewed and are negative.    Past Medical History:  Past Medical History:  03/27/2006: ABN FIND-STOOL CONTENTS-OCC BLOOD      Comment:  s/p colonoscopy and EGD 7/07  06/04/2006: ABSENCE OF MENSTRUATION - s/p TAH BSO  08/05/2004: ANXIETY DEPRESSION      Comment:  TSH wnl 8/02; trial of fluoxetine 8/05  08/05/2004: BACK PAIN LOW      Comment:  LBP musculoskeletal most likely 1/04  04/23/2006: Cough      Comment:  s/p PFTs September 2007 --  some obstruction at mid to                low lung volumes not correctable by bronchodilator CT                scan showed stable pulmonary nodules over several years                11/07 - cough gone ** note combined with dx of "nodules                on CT scan". Will file this note to hx.      08/05/2004: depressive disorder      Comment:  TSH wnl 8/02; trial of fluoxetine x 6 wks didn't really                help 8/05  trial of citalopram per Dr. Patrice Paradise. later                resolved. 04/15/2014 - Reassessed, pt reports feeling                well, with no sx of depression. Just reports being tired,               which she attributes to current life style. Will resolve  from problem list.    03/27/2006: Diarrhea      Comment:  7/07 The colonoscopy was visually normal and biopsies                taken to rule out microscopic colitis were negative.  A                biopsy of the descending duodenum to rule out celiac                sprue because of the diarrhea was negative.    12/19/2012: Gastritis      Comment:  Combined with entry "esophageal reflux    10/22/2006: Hyperparathyroidism      Comment:  osteoporosis found - pt referred to surgery for the                hyperparathyroidism- s/p parathyroidectomy feb 2008  06/18/2012: Inconclusive mammogram      Comment:  June 2013 - BIRADS 3. Probable benign mammogram.                Recommend 6 month  follow-up mammogram (Dec 2013).                 Dec/13 - Stable left breast nodule with no evidence of                malignancy. BIRADS 2 benign recommend annual screening                mammogram in 6 months  (June 2014) June/14 - mammogram                request given to pt, she will call to schedule:  Jan/15 -               normal mammogram, yearly screening. Resolve from problem                list.        03/13/2006: Insomnia, unspecified      Comment:  06/25/2012 - intermittent, usually resolved by taking                Tylenol PM 06/09/2014 - good response to trazodone, takes                one to 4x wk, prn 08/24/2016 - reports no problems sleeping               - file to hx, and resolve from problem list.   03/27/2006: nodules on chest CT 1/06 and 3/07      Comment:  s/p PFTs September 2007 --  some obstruction at mid to                low lung  volumes not correctable by bronchodilator CT                scan showed stable pulmonary nodules over several years                11/07 - cough gone  04/29/12 - saw pneumologist, Dr Jordan Hawks,               "Will repeat chest CT with high-resolution scans at the 2               month mark (already ordered). If abnormal, would do PFT's               and further rheum w/u, consider lung bx . Pt to  call if                any r  08/05/2004: OBESITY NOS      Comment:  has tried meds from [place]  08/05/2004: PERS HX OF PAST NONCOMPLIANCE      Comment:  poor compliance to med regimen  08/05/2004: PERS HX TOBACCO USE      Comment:  quit 10/02; 1/2 PPD since 65 YO  08/05/2004: Screening for hypertension      Comment:  occ elevated BP started on anti-HTN in [place]; chem 7,                EKG, HCT u/a wnl 4/03; d/c'ed HCTZ 8/05 when we learned                from daughter she has not been taking it  05/19/2008: Vitamin D deficiency      Comment:  Supplement per dr Jake Samples.    Immunization History:  Immunization History   Administered Date(s) Administered    Covid-19 Vaccine [employer] - [employer] Cap) 05/09/2021    Covid-19 Vaccine [employer] - [employer] Cap) 03/15/2020, 04/05/2020, 10/15/2020    INFLUENZA VIRUS TRI W/PRESV VACCINE 18/> YRS IM (PRIVATE) 10/22/2006, 09/12/2012    Influenza Virus Quad Presv Free Vacc 6 Mo and Older, IM 10/25/2017, 09/23/2020    Influenza Virus Vac, Quad (CCIIV4)IM 03/30/2016    PPD 05/18/2006    Tdap 10/22/2006, 10/25/2017    Zoster Vacc (HZV) Recomb Adjv, IM, 2 Dose, ([place]) 10/28/2020       Past Surgical History:  Past Surgical History:  04/2016: BARIATRIC SURGERY      Comment:  done at [facility]  2006: PARATHYROIDECTOMY      Comment:  partial,   5/00: SALPINGO-OOPHORECTOMY COMPL/PRTL UNI/BI SPX      Comment:  Salpingo-Oophorectomy, bilateral  No date: TONSILLECTOMY ONE-HALF <AGE 28  5/00: TOTAL ABDOMINAL HYSTERECT W/WO RMVL TUBE OVARY      Comment:  Hysterectomy, Total Abdominal secondary to  fibroids    Medications:    No current facility-administered medications for this encounter.     Current Outpatient Medications   Medication Sig    ondansetron (ZOFRAN) 4 MG tablet Take 1 tablet by mouth every 6 (six) hours as needed for Nausea for nausea and vomitting  for up to 5 days    famotidine (PEPCID) 40 MG tablet Take 1 tablet by mouth in the morning and 1 tablet before bedtime.    cholecalciferol (VITAMIN D3) 1000 UNIT tablet Take 1 tablet by mouth in the morning.    losartan (COZAAR) 50 MG tablet Take 2 tablets by mouth daily    hydrochlorothiazide (HYDRODIURIL) 25 MG tablet Take 1 tablet by mouth daily       Social History:  Social History    Tobacco Use      Smoking status: Former Smoker        Packs/day: 0.50        Years: 40.00        Pack years: 20        Types: Cigarettes        Quit date: 03/31/2012        Years since quitting: 9.4      Smokeless tobacco: Former Systems developer      Tobacco comment: 3-4 cigarretes a day. Quit about 1 month ago    Alcohol use: Yes      Comment: very occasional      Family History:  Review of patient's  family history indicates:  Problem: No Known Problems      Relation: Father          Age of Onset: (Not Specified)  Problem: Cancer - Lung      Relation: Father          Age of Onset: (Not Specified)          Comment: died from lung CA- throat cancer per pt. Also on dyalisis               for 10 years  Problem: Hypertension      Relation: Mother          Age of Onset: (Not Specified)  Problem: Lipids      Relation: Mother          Age of Onset: (Not Specified)          Comment: hyperlipidemia  Problem: Heart      Relation: Maternal Uncle          Age of Onset: (Not Specified)          Comment: died 5 from MI  Problem: Diabetes      Relation: Maternal Aunt          Age of Onset: (Not Specified)  Problem: Diabetes      Relation: Mother          Age of Onset: (Not Specified)          Comment: also morbidly weight  Problem: Cancer - Other      Relation: Brother          Age of  Onset: (Not Specified)          Comment: liver cancer, Sept 2013  Problem: Non-contributory      Relation: Brother          Age of Onset: (Not Specified)          Comment: no first deg rel. w/ breast ca  Problem: No Known Problems      Relation: Sister          Age of Onset: (Not Specified)  Problem: No Known Problems      Relation: Daughter          Age of Onset: (Not Specified)  Problem: No Known Problems      Relation: Maternal Grandmother          Age of Onset: (Not Specified)  Problem: No Known Problems      Relation: Maternal Grandfather          Age of Onset: (Not Specified)  Problem: No Known Problems      Relation: Paternal Grandmother          Age of Onset: (Not Specified)  Problem: No Known Problems      Relation: Paternal Grandfather          Age of Onset: (Not Specified)      Allergies:  Review of Patient's Allergies indicates:   Lisinopril              Cough   Heparin                 Rash    Comment:local rash seen during hospitalization Feb 2008             Received lovenox during hospitalization Oct 2021             w/o issue    Physical Exam:   GENERAL: No acute distress, well nourished  HEAD: Atraumatic  EYES: No evidence of trauma. Sclerae anicteric.  Pupils equal.  No nystagmus.  ENT: Wearing a facemask  NECK:  Supple.  No stridor.  LUNGS: Clear to auscultation bilaterally.  No significant wheezing.  Respirations unlabored.  HEART: Regular rate & rhythm.   ABDOMEN: Soft, nontender.    EXTREMITIES/MSK: No obvious deformities or trauma.  Warm and well perfused.  SKIN: Warm and dry, no rash  NEUROLOGIC: Alert and oriented x3; moves all extremities well; speaking in clear fluent sentences. Sensation intact to light touch throughout.  PSYCHIATRIC: Appropriate, cooperative.  LYMPH: No significant peripheral edema.    Labs:  Labs Reviewed   CBC, PLATELET & DIFFERENTIAL   BASIC METABOLIC PANEL   TROPONIN T HS BASELINE         COVID STATUS:  COVID-19 INPATIENT (no units)   Date Value   09/23/2020      Negative for SARS-CoV-2(2019 novel coronavirus)by PCR  methodology.  Negative results do not preclude 2019-nCoV infection and  should not be used as the sole basis for patient management  decisions. Negative results must be combined with clinical  observations, patient history, and epidemiological  information.  This test has been authorized by the FDA under an Emergency  Use Authorization (EUA) for use by authorized laboratories.       COVID-19 INPATIENT SURVEILLANCE (no units)   Date Value   09/26/2020     Negative for SARS-CoV-2(2019 novel coronavirus)by RT-PCR  methodology.  The [employer] Fusion SARS-CoV-2 assay is a real-time RT-PCR in  vitro diagnostic test intended for the qualitative detection  of RNA from SARS-CoV-2 virus. Negative results do not  preclude SARS-CoV-2 infection and should not be used as the  sole basis for patient management decisions. Negative  results must be combined with other clinical observations,  patient history, and epidemiological information.  This test has been authorized by the FDA under an Emergency  Use Authorization (EUA) for use by authorized laboratories.          Vital Signs During ED Stay:   09/13/21  1731 09/13/21  1947   BP: (!) 169/117    Pulse: 83 70   Resp: 17    Temp: 98.1 F    SpO2: 96% 98%   Weight: 90.7 kg (200 lb)        Meds Given during ED visit:  Medications   ondansetron (ZOFRAN-ODT) disintegrating tablet 4 mg (4 mg Oral Given 09/13/21 1929)   acetaminophen (TYLENOL) tablet 650 mg (650 mg Oral Given 09/13/21 1929)       Imaging Results & Interpretation:  No orders to display    ED Course, Medical Decision-making, Disposition:  65 year old female patient presents for concerns of elevated blood pressure.  She reports headache and feelings of dizziness.  She is well-appearing, without any signs of encephalopathy.  No nystagmus or focal neurological findings evident.    Provided with Zofran, Tylenol, and screening lab tests were obtained to check for endorgan  damage.    Troponin, creatinine normal.      Well-appearing, normal gait.  Do not suspect hypertensive emergency.  I recommended that she follow-up with her primary care doctor for blood pressure recheck.     Patient was given standardized discharge instructions and return precautions.      Discharge Medication List as of 09/13/2021  8:07 PM    START taking these medications    ondansetron (ZOFRAN) 4 MG tablet  Take 1 tablet by mouth every 6 (six)  hours as needed for Nausea for nausea and vomitting  for up to 5 days  Tamperproof, Disp-15 tablet, R-0          Disposition:  Discharge    Condition:  Stable    Diagnosis/Diagnoses:  Elevated blood pressure reading    Windy Kalata, MD  09/16/2021  Dept of Emergency Medicine  [facility]    This Emergency Department patient encounter note was created using voice-recognition software and in real time during the ED visit. Please excuse any typographical errors that have not yet been reviewed and corrected.

## 2021-10-05 ENCOUNTER — Other Ambulatory Visit (HOSPITAL_BASED_OUTPATIENT_CLINIC_OR_DEPARTMENT_OTHER): Payer: Self-pay | Admitting: Internal Medicine

## 2021-10-05 DIAGNOSIS — R1084 Generalized abdominal pain: Secondary | ICD-10-CM

## 2021-10-05 NOTE — Telephone Encounter (Signed)
PER Pharmacy, Mariah [name] is a 65 year old female has requested a refill of      - Famotidine     Last Office Visit: 08/23/21 with PCP  Last Physical Exam: 12/27/18     There are no preventive care reminders to display for this patient.     Other Med Adult:  Most Recent BP Reading(s)  09/13/21 : (!) 169/117        Cholesterol (mg/dL)   Date Value   02/11/2020 190     LOW DENSITY LIPOPROTEIN DIRECT (mg/dL)   Date Value   02/11/2020 116     HIGH DENSITY LIPOPROTEIN (mg/dL)   Date Value   02/11/2020 56     TRIGLYCERIDES (mg/dL)   Date Value   02/11/2020 128         THYROID SCREEN TSH REFLEX FT4 (uIU/mL)   Date Value   06/24/2020 1.870         TSH (THYROID STIM HORMONE) (uIU/mL)   Date Value   04/03/2012 1.68       HEMOGLOBIN A1C (%)   Date Value   05/09/2021 5.8 (H)       No results found for: POCA1C      INR (no units)   Date Value   04/28/2014 < 1.0   01/18/2007 1.0 (L)       SODIUM (mmol/L)   Date Value   09/13/2021 140       POTASSIUM (mmol/L)   Date Value   09/13/2021 4.1           CREATININE (mg/dL)   Date Value   09/13/2021 0.8        Documented patient preferred pharmacies:    [facility], [place] - [place]. [address]  Phone: [phone] Fax: [phone]

## 2021-10-06 MED FILL — FAMOTIDINE 40MG: 30 days supply | Qty: 60 | Fill #0

## 2021-11-29 ENCOUNTER — Emergency Department
Admission: EM | Admit: 2021-11-29 | Discharge: 2021-11-30 | Disposition: A | Discharge status: Home / Self Care | Payer: No Typology Code available for payment source | Attending: Emergency Medicine | Admitting: Emergency Medicine

## 2021-11-29 ENCOUNTER — Encounter (HOSPITAL_BASED_OUTPATIENT_CLINIC_OR_DEPARTMENT_OTHER): Payer: Self-pay

## 2021-11-29 ENCOUNTER — Other Ambulatory Visit: Payer: Self-pay

## 2021-11-29 DIAGNOSIS — Z87891 Personal history of nicotine dependence: Secondary | ICD-10-CM | POA: Diagnosis not present

## 2021-11-29 DIAGNOSIS — G43019 Migraine without aura, intractable, without status migrainosus: Secondary | ICD-10-CM | POA: Diagnosis not present

## 2021-11-29 DIAGNOSIS — R519 Headache, unspecified: Secondary | ICD-10-CM | POA: Diagnosis present

## 2021-11-29 MED ORDER — KETOROLAC TROMETHAMINE 30 MG/ML INJ
15.0000 mg | Freq: Once | Status: AC
Start: 2021-11-29 — End: 2021-11-29
  Administered 2021-11-29: 15 mg via INTRAVENOUS
  Filled 2021-11-29: qty 1

## 2021-11-29 MED ORDER — SODIUM CHLORIDE 0.9 % IV BOLUS
1000.0000 mL | Freq: Once | INTRAVENOUS | Status: AC
Start: 2021-11-29 — End: 2021-11-30
  Administered 2021-11-29: 1000 mL via INTRAVENOUS

## 2021-11-29 MED ORDER — SODIUM CHLORIDE 0.9 % IV SOLN
10.0000 mg | Freq: Once | INTRAVENOUS | Status: AC
Start: 2021-11-29 — End: 2021-11-29
  Administered 2021-11-29: 10 mg via INTRAVENOUS
  Filled 2021-11-29: qty 2

## 2021-11-29 MED ORDER — DIPHENHYDRAMINE HCL 50 MG/ML IJ SOLN
25.0000 mg | Freq: Once | INTRAMUSCULAR | Status: AC
Start: 2021-11-29 — End: 2021-11-29
  Administered 2021-11-29: 25 mg via INTRAVENOUS
  Filled 2021-11-29: qty 1

## 2021-11-29 NOTE — ED Provider Notes (Signed)
The patient was seen primarily by me. ED nursing record was reviewed. Select prior records as available electronically through the Epic record were reviewed.    History, physical exam and disposition were conducted with an official hospital [place] interpreter.    HPI:    Mariah [name] is a 65 year old female patient with h/o migraine headaches, who presents with headache for 2 days, associated with nausea, vomiting, and light sensitivity. Feels like similar headaches, not worst HA of her life.     ROS: Pertinent positives were reviewed as per the HPI above. All other systems were reviewed and are negative.  Alphonzo Severance  Language of care: [place] Derwood Kaplan)  MRN: [id number]  PCP: Lawernce Pitts, APRN  Mode of arrival to ED: Relative.  Arrival time:     Chief complaint: Headache    Past Medical History/Problem list:  Past Medical History:  03/27/2006: ABN FIND-STOOL CONTENTS-OCC BLOOD      Comment:  s/p colonoscopy and EGD 7/07  06/04/2006: ABSENCE OF MENSTRUATION - s/p TAH BSO  08/05/2004: ANXIETY DEPRESSION      Comment:  TSH wnl 8/02; trial of fluoxetine 8/05  08/05/2004: BACK PAIN LOW      Comment:  LBP musculoskeletal most likely 1/04  04/23/2006: Cough      Comment:  s/p PFTs September 2007 --  some obstruction at mid to                low lung volumes not correctable by bronchodilator CT                scan showed stable pulmonary nodules over several years                11/07 - cough gone ** note combined with dx of "nodules                on CT scan". Will file this note to hx.     08/05/2004: depressive disorder      Comment:  TSH wnl 8/02; trial of fluoxetine x 6 wks didn't really                help 8/05  trial of citalopram per Dr. Patrice Paradise. later                resolved. 04/15/2014 - Reassessed, pt reports feeling                well, with no sx of depression. Just reports being tired,               which she attributes to current life style. Will resolve                from problem list.     03/27/2006: Diarrhea      Comment:  7/07 The colonoscopy was visually normal and biopsies                taken to rule out microscopic colitis were negative.  A                biopsy of the descending duodenum to rule out celiac                sprue because of the diarrhea was negative.    12/19/2012: Gastritis      Comment:  Combined with entry "esophageal reflux    10/22/2006: Hyperparathyroidism      Comment:  osteoporosis found - pt referred to surgery for the  hyperparathyroidism- s/p parathyroidectomy feb 2008  06/18/2012: Inconclusive mammogram      Comment:  June 2013 - BIRADS 3. Probable benign mammogram.                Recommend 6 month  follow-up mammogram (Dec 2013).                 Dec/13 - Stable left breast nodule with no evidence of                malignancy. BIRADS 2 benign recommend annual screening                mammogram in 6 months  (June 2014) June/14 - mammogram                request given to pt, she will call to schedule:  Jan/15 -               normal mammogram, yearly screening. Resolve from problem                list.        03/13/2006: Insomnia, unspecified      Comment:  06/25/2012 - intermittent, usually resolved by taking                Tylenol PM 06/09/2014 - good response to trazodone, takes                one to 4x wk, prn 08/24/2016 - reports no problems sleeping               - file to hx, and resolve from problem list.   03/27/2006: nodules on chest CT 1/06 and 3/07      Comment:  s/p PFTs September 2007 --  some obstruction at mid to                low lung volumes not correctable by bronchodilator CT                scan showed stable pulmonary nodules over several years                11/07 - cough gone  04/29/12 - saw pneumologist, Dr Jordan Hawks,               "Will repeat chest CT with high-resolution scans at the 2               month mark (already ordered). If abnormal, would do PFT's               and further rheum w/u, consider lung bx . Pt to call if                any  r  08/05/2004: OBESITY NOS      Comment:  has tried meds from [place]  08/05/2004: PERS HX OF PAST NONCOMPLIANCE      Comment:  poor compliance to med regimen  08/05/2004: PERS HX TOBACCO USE      Comment:  quit 10/02; 1/2 PPD since 65 YO  08/05/2004: Screening for hypertension      Comment:  occ elevated BP started on anti-HTN in [place]; chem 7,                EKG, HCT u/a wnl 4/03; d/c'ed HCTZ 8/05 when we learned                from daughter she has not been taking it  05/19/2008:  Vitamin D deficiency      Comment:  Supplement per dr Jake Samples.  Patient Active Problem List:     History of bariatric surgery     LATERAL FOOT PAIN     Benign hypertensive heart disease without heart failure     Alkaline phosphatase elevation     Calculus of kidney     Gastritis     Allergic rhinitis due to other allergen     Osteoporosis     Murmur heart     Family history of throat cancer     Vitamin D deficiency     Subclavian artery stenosis, left (HCC)     Schatzki's ring     Duodenal ulcer, unspecified as acute or chronic, without hemorrhage, perforation, or obstruction     Mild depression     Glaucoma suspect of both eyes     Hyperopia with presbyopia of both eyes     Abnormal CT lung screening     Elevated LFTs     COVID-19 virus infection     SBO (small bowel obstruction) ([place])    Past Surgical History: Past Surgical History:  04/2016: BARIATRIC SURGERY      Comment:  done at [place]  2006: PARATHYROIDECTOMY      Comment:  partial,   5/00: SALPINGO-OOPHORECTOMY COMPL/PRTL UNI/BI SPX      Comment:  Salpingo-Oophorectomy, bilateral  No date: TONSILLECTOMY ONE-HALF <AGE 53  5/00: TOTAL ABDOMINAL HYSTERECT W/WO RMVL TUBE OVARY      Comment:  Hysterectomy, Total Abdominal secondary to fibroids  Social History:   Social History     Socioeconomic History    Marital status: Married     Spouse name: Not on file    Number of children: Not on file    Years of education: Not on file    Highest education level: Not on file    Occupational History    Not on file   Tobacco Use    Smoking status: Former     Packs/day: 0.50     Years: 40.00     Pack years: 20.00     Types: Cigarettes     Quit date: 03/31/2012     Years since quitting: 9.6    Smokeless tobacco: Former    Tobacco comments:     3-4 cigarretes a day. Quit about 1 month ago   Substance and Sexual Activity    Alcohol use: Yes     Comment: very occasional    Drug use: No    Sexual activity: Yes     Partners: Male     Comment: histerectomy   Other Topics Concern    Not on file   Social History Narrative    work: Engineer, building services; owned bakery in [place]    Lives w/ daughter 65 YO (Grazielle Rosita) & son-in-law Lowella Dandy)    Divorced; currently not in a relationship        05/27/2015    From [place]. Works as Electrical engineer, has own business.     Married, Djaniro.     No regular exercise    Diet: varies. Eats at irregular intervals. Tries to watch diet, but likes to eat    Stressors: work    Pleasures: family, grand children        08/24/2016    Same living and work situation. No regular exercise. Diet improved after bariatric surgery, and pt is very happy with results.         01/09/2019  No changes in social hx   Social Determinants of Health  Financial Resource Strain: Not on file  Food Insecurity: Not on file  Transportation Needs: Not on file  Physical Activity: Not on file  Stress: Not on file  Social Connections: Not on file  Intimate Partner Violence: Not on file  Housing Stability: Not on file   Allergies: Review of Patient's Allergies indicates:   Lisinopril              Cough   Heparin                 Rash    Comment:local rash seen during hospitalization Feb 2008             Received lovenox during hospitalization Oct 2021             w/o issue    Immunizations:   Immunization History   Administered Date(s) Administered    Covid-19 Vaccine ([place]) 05/09/2021    Covid-19 Vaccine ([place]) 03/15/2020, 04/05/2020, 10/15/2020    INFLUENZA VIRUS TRI  W/PRESV VACCINE 18/> YRS IM (PRIVATE) 10/22/2006, 09/12/2012    Influenza Virus Quad Presv Free Vacc 6 Mo and Older, IM 10/25/2017, 09/23/2020    Influenza Virus Vac, Quad (CCIIV4)IM 03/30/2016    PPD 05/18/2006    Tdap 10/22/2006, 10/25/2017    Zoster Vacc (HZV) Recomb Adjv, IM, 2 Dose, ([place]) 10/28/2020          Medications:  Prior to Admission Medications   Prescriptions Last Dose Informant Patient Reported? Taking?   cholecalciferol (VITAMIN D3) 1000 UNIT tablet   No No   Sig: Take 1 tablet by mouth in the morning.   hydrochlorothiazide (HYDRODIURIL) 25 MG tablet   No No   Sig: Take 1 tablet by mouth daily   losartan (COZAAR) 50 MG tablet   No No   Sig: Take 2 tablets by mouth daily      Facility-Administered Medications: None     Physical Exam (ED Bed 11/11-A):   Patient Vitals for the past 99 hrs:   BP Temp Pulse Resp SpO2   11/30/21 0005 161/89 -- 95 -- 93 %   11/29/21 2345 (!) 176/105 -- 107 -- 96 %   11/29/21 2108 162/100 98.8 F 112 16 98 %     GENERAL:  No acute distress, non-toxic appearance.   SKIN:  Warm & Dry. No rash, no petechia.  HEAD:  No signs of head trauma. Moist mucous membranes.   NECK:  Normal movement of the neck without obvious discomfort.  CHEST:  Breathing comfortably, in no respiratory distress.   HEART:  RRR.    BACK: No discomfort with normal movements.   EXTREMITIES:  No obvious deformities.  Warm and well perfused.  No cyanosis, no edema.  NEUROLOGIC:  Alert, speaking in clear sentences, and moving all extremities. Normal gait without ataxia.   PSYCHIATRIC:  Appropriate for age, time of day, and situation.    Medications Given in the ED:  Medications   sodium chloride 0.9 % IV bolus 1,000 mL (1,000 mLs Intravenous New Bag 11/29/21 2337)   ketorolac (TORADOL) injection 15 mg (15 mg Intravenous Given 11/29/21 2337)   diphenhydrAMINE (BENADRYL) injection 25 mg (25 mg Intravenous Given 11/29/21 2338)   metoclopramide (REGLAN) 10 mg in sodium chloride 0.9% 50 mL IVPB (10 mg  Intravenous Given 11/29/21 2337)    Radiology Studies:  N/A   Lab Results (abnormal results only):  Labs  Reviewed - No data to display Other Results (e.g. ECG):  N/A     ED Course and Medical Decision-making:  65 year old female patient with migraine headache. After treatment, patient feeling much better and requesting to go home. Don't suspect ICH or tumor. Will return if feels worse at all.    Patient/family educated on diagnosis(es); she states understanding and agrees with plan of care.  She agrees with this plan and disposition. Reasons to return to the ED were reviewed in detail.    Condition on Discharge: Improved and Stable    Diagnosis/Diagnoses:  Intractable migraine without aura and without status migrainosus      Olam Idler, MD  Attending Physician  [facility] Department of Emergency Medicine    This Emergency Department patient encounter note was created using voice-recognition software and in real time during the ED visit.

## 2021-11-29 NOTE — ED Triage Note (Signed)
Patient comes to the ED complaining of 2 days of headache, nausea, and vomiting.  Patient states that she checked her blood pressure at home and it was high.

## 2021-11-29 NOTE — ED Provider Notes (Incomplete)
The patient was seen primarily by me. ED nursing record was reviewed. Select prior records as available electronically through the Epic record were reviewed.    History, physical exam and disposition were conducted with an official hospital [place] interpreter.    HPI:    Mariah [name] is a 65 year old female patient with h/o migraine headaches, who presents with headache for 2 days, associated with nausea, vomiting, and light sensitivity. Feels like similar headaches, not worst HA of her life.     ROS: Pertinent positives were reviewed as per the HPI above. All other systems were reviewed and are negative.  Alphonzo Severance  Language of care: [place] Derwood Kaplan)  MRN: [id number]  PCP: Lawernce Pitts, APRN  Mode of arrival to ED: Relative.  Arrival time:     Chief complaint: Headache    Past Medical History/Problem list:  Past Medical History:  03/27/2006: ABN FIND-STOOL CONTENTS-OCC BLOOD      Comment:  s/p colonoscopy and EGD 7/07  06/04/2006: ABSENCE OF MENSTRUATION - s/p TAH BSO  08/05/2004: ANXIETY DEPRESSION      Comment:  TSH wnl 8/02; trial of fluoxetine 8/05  08/05/2004: BACK PAIN LOW      Comment:  LBP musculoskeletal most likely 1/04  04/23/2006: Cough      Comment:  s/p PFTs September 2007 --  some obstruction at mid to                low lung volumes not correctable by bronchodilator CT                scan showed stable pulmonary nodules over several years                11/07 - cough gone ** note combined with dx of "nodules                on CT scan". Will file this note to hx.     08/05/2004: depressive disorder      Comment:  TSH wnl 8/02; trial of fluoxetine x 6 wks didn't really                help 8/05  trial of citalopram per Dr. Patrice Paradise. later                resolved. 04/15/2014 - Reassessed, pt reports feeling                well, with no sx of depression. Just reports being tired,               which she attributes to current life style. Will resolve                from problem list.     03/27/2006: Diarrhea      Comment:  7/07 The colonoscopy was visually normal and biopsies                taken to rule out microscopic colitis were negative.  A                biopsy of the descending duodenum to rule out celiac                sprue because of the diarrhea was negative.    12/19/2012: Gastritis      Comment:  Combined with entry "esophageal reflux    10/22/2006: Hyperparathyroidism      Comment:  osteoporosis found - pt referred to surgery for the  hyperparathyroidism- s/p parathyroidectomy feb 2008  06/18/2012: Inconclusive mammogram      Comment:  June 2013 - BIRADS 3. Probable benign mammogram.                Recommend 6 month  follow-up mammogram (Dec 2013).                 Dec/13 - Stable left breast nodule with no evidence of                malignancy. BIRADS 2 benign recommend annual screening                mammogram in 6 months  (June 2014) June/14 - mammogram                request given to pt, she will call to schedule:  Jan/15 -               normal mammogram, yearly screening. Resolve from problem                list.        03/13/2006: Insomnia, unspecified      Comment:  06/25/2012 - intermittent, usually resolved by taking                Tylenol PM 06/09/2014 - good response to trazodone, takes                one to 4x wk, prn 08/24/2016 - reports no problems sleeping               - file to hx, and resolve from problem list.   03/27/2006: nodules on chest CT 1/06 and 3/07      Comment:  s/p PFTs September 2007 --  some obstruction at mid to                low lung volumes not correctable by bronchodilator CT                scan showed stable pulmonary nodules over several years                11/07 - cough gone  04/29/12 - saw pneumologist, Dr Jordan Hawks,               "Will repeat chest CT with high-resolution scans at the 2               month mark (already ordered). If abnormal, would do PFT's               and further rheum w/u, consider lung bx . Pt to call if                any  r  08/05/2004: OBESITY NOS      Comment:  has tried meds from [place]  08/05/2004: PERS HX OF PAST NONCOMPLIANCE      Comment:  poor compliance to med regimen  08/05/2004: PERS HX TOBACCO USE      Comment:  quit 10/02; 1/2 PPD since 65 YO  08/05/2004: Screening for hypertension      Comment:  occ elevated BP started on anti-HTN in [place]; chem 7,                EKG, HCT u/a wnl 4/03; d/c'ed HCTZ 8/05 when we learned                from daughter she has not been taking it  05/19/2008:  Vitamin D deficiency      Comment:  Supplement per dr Jake Samples.  Patient Active Problem List:     History of bariatric surgery     LATERAL FOOT PAIN     Benign hypertensive heart disease without heart failure     Alkaline phosphatase elevation     Calculus of kidney     Gastritis     Allergic rhinitis due to other allergen     Osteoporosis     Murmur heart     Family history of throat cancer     Vitamin D deficiency     Subclavian artery stenosis, left (HCC)     Schatzki's ring     Duodenal ulcer, unspecified as acute or chronic, without hemorrhage, perforation, or obstruction     Mild depression     Glaucoma suspect of both eyes     Hyperopia with presbyopia of both eyes     Abnormal CT lung screening     Elevated LFTs     COVID-19 virus infection     SBO (small bowel obstruction) ([place])    Past Surgical History: Past Surgical History:  04/2016: BARIATRIC SURGERY      Comment:  done at [place]  2006: PARATHYROIDECTOMY      Comment:  partial,   5/00: SALPINGO-OOPHORECTOMY COMPL/PRTL UNI/BI SPX      Comment:  Salpingo-Oophorectomy, bilateral  No date: TONSILLECTOMY ONE-HALF <AGE 8  5/00: TOTAL ABDOMINAL HYSTERECT W/WO RMVL TUBE OVARY      Comment:  Hysterectomy, Total Abdominal secondary to fibroids  Social History:   Social History     Socioeconomic History   . Marital status: Married     Spouse name: Not on file   . Number of children: Not on file   . Years of education: Not on file   . Highest education level: Not on file    Occupational History   . Not on file   Tobacco Use   . Smoking status: Former     Packs/day: 0.50     Years: 40.00     Pack years: 20.00     Types: Cigarettes     Quit date: 03/31/2012     Years since quitting: 9.6   . Smokeless tobacco: Former   . Tobacco comments:     3-4 cigarretes a day. Quit about 1 month ago   Substance and Sexual Activity   . Alcohol use: Yes     Comment: very occasional   . Drug use: No   . Sexual activity: Yes     Partners: Male     Comment: histerectomy   Other Topics Concern   . 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 Works as Electrical engineer, has own business.     Married, Djaniro.     No regular exercise    Diet: varies. Eats at irregular intervals. Tries to watch diet, but likes to eat    Stressors: work    Pleasures: family, grand children        08/24/2016    Same living and work situation. No regular exercise. Diet improved after bariatric surgery, and pt is very happy with results.         01/09/2019  No changes in social hx   Social Determinants of Health  Financial Resource Strain: Not on file  Food Insecurity: Not on file  Transportation Needs: Not on file  Physical Activity: Not on file  Stress: Not on file  Social Connections: Not on file  Intimate Partner Violence: Not on file  Housing Stability: Not on file   Allergies: Review of Patient's Allergies indicates:   Lisinopril              Cough   Heparin                 Rash    Comment:local rash seen during hospitalization Feb 2008             Received lovenox during hospitalization Oct 2021             w/o issue    Immunizations:   Immunization History   Administered Date(s) Administered   . Covid-19 Vaccine ([place]) 05/09/2021   . Covid-19 Vaccine [employer] [facility] [place]) 03/15/2020, 04/05/2020, 10/15/2020   . INFLUENZA VIRUS TRI  W/PRESV VACCINE 18/> YRS IM (PRIVATE) 10/22/2006, 09/12/2012   . Influenza Virus Quad Presv Free Vacc 6 Mo and Older, IM 10/25/2017, 09/23/2020   . Influenza Virus Vac, Quad (CCIIV4)IM 03/30/2016   . PPD 05/18/2006   . Tdap 10/22/2006, 10/25/2017   . Zoster Vacc (HZV) Recomb Adjv, IM, 2 Dose, ([place]) 10/28/2020          Medications:  Prior to Admission Medications   Prescriptions Last Dose Informant Patient Reported? Taking?   cholecalciferol (VITAMIN D3) 1000 UNIT tablet   No No   Sig: Take 1 tablet by mouth in the morning.   hydrochlorothiazide (HYDRODIURIL) 25 MG tablet   No No   Sig: Take 1 tablet by mouth daily   losartan (COZAAR) 50 MG tablet   No No   Sig: Take 2 tablets by mouth daily      Facility-Administered Medications: None     Physical Exam (ED Bed 11/11-A):   Patient Vitals for the past 99 hrs:   BP Temp Pulse Resp SpO2   11/29/21 2108 162/100 98.8 F 112 16 98 %     GENERAL:  No acute distress, non-toxic appearance.   SKIN:  Warm & Dry. No rash, no petechia.  HEAD:  No signs of head trauma. Moist mucous membranes.   NECK:  Normal movement of the neck without obvious discomfort.  CHEST:  Breathing comfortably, in no respiratory distress.   HEART:  RRR.    BACK: No discomfort with normal movements.   EXTREMITIES:  No obvious deformities.  Warm and well perfused.  No cyanosis, no edema.  NEUROLOGIC:  Alert, speaking in clear sentences, and moving all extremities. Normal gait without ataxia.   PSYCHIATRIC:  Appropriate for age, time of day, and situation.    Medications Given in the ED:  Medications   sodium chloride 0.9 % IV bolus 1,000 mL (has no administration in time range)   ketorolac (TORADOL) injection 15 mg (has no administration in time range)   diphenhydrAMINE (BENADRYL) injection 25 mg (has no administration in time range)   metoclopramide (REGLAN) 10 mg in sodium chloride 0.9% 50 mL IVPB (has no administration in time range)    Radiology Studies:  N/A   Lab Results (abnormal results  only):  Labs Reviewed - No data to display Other Results (e.g. ECG):  N/A     ED Course and Medical Decision-making:  65 year old female patient with ***    Patient/family educated on diagnosis(es); she states understanding and agrees with plan of care.  She agrees with this plan and disposition. Reasons to return to the ED were reviewed in detail.    ***Condition on Discharge: Improved and Stable  ***Condition on Admission: Improved and Stable  ***Condition on Signout: Improved and Stable    Diagnosis/Diagnoses:  No diagnosis found.    Patient seen immediately upon arrival secondary to high probability of life threatening deterioration. Total critical care time is *** minutes outside of separately billable procedures. Time included the complexity of the case; directly caring for the patient; interpreting labwork, EKG, and chest x-ray; discussing with intensivist/hospitalist ***; and documenting the chart.    Olam Idler, MD  Attending Physician  [facility] Department of Emergency Medicine    This Emergency Department patient encounter note was created using voice-recognition software and in real time during the ED visit.

## 2021-11-30 NOTE — Narrator Note (Signed)
Pt resting in stretcher stating her pain is a little better. Equal chest rise and fall with unlabored breathing at this time.

## 2021-11-30 NOTE — Narrator Note (Signed)
Pt ambulated to the restroom with a steady gait in no acute distress.

## 2021-11-30 NOTE — Narrator Note (Signed)
Patient Disposition  Patient education for diagnosis, medications, activity, diet and follow-up.  Patient left ED 1:22 AM.  Patient rep received written instructions.    Interpreter to provide instructions: Yes; Interpreter ID: [id number]    Patient belongings with patient: YES    Have all existing LDAs been addressed? Yes    Have all IV infusions been stopped? Yes    Destination: Discharged to home

## 2021-12-01 ENCOUNTER — Encounter (HOSPITAL_BASED_OUTPATIENT_CLINIC_OR_DEPARTMENT_OTHER): Payer: Self-pay | Admitting: Internal Medicine

## 2021-12-01 ENCOUNTER — Other Ambulatory Visit: Payer: Self-pay

## 2021-12-01 ENCOUNTER — Ambulatory Visit: Payer: No Typology Code available for payment source | Attending: Internal Medicine | Admitting: Internal Medicine

## 2021-12-01 VITALS — BP 168/92 | HR 82 | Temp 97.4°F | Wt 205.8 lb

## 2021-12-01 DIAGNOSIS — J069 Acute upper respiratory infection, unspecified: Secondary | ICD-10-CM | POA: Diagnosis present

## 2021-12-01 DIAGNOSIS — Z20822 Contact with and (suspected) exposure to covid-19: Secondary | ICD-10-CM | POA: Diagnosis not present

## 2021-12-01 LAB — RESPIRATORY PANEL BASIC OUTPT
INFLUENZA A: POSITIVE
INFLUENZA B: NEGATIVE
RESPIRATORY SYNCYTIAL VIRUS: NEGATIVE
SARS-COV-2: NEGATIVE

## 2021-12-01 MED ORDER — ONDANSETRON HCL 4 MG PO TABS
4.00 mg | ORAL_TABLET | Freq: Two times a day (BID) | ORAL | 0 refills | Status: AC | PRN
Start: 2021-12-01 — End: 2021-12-06

## 2021-12-01 MED FILL — ONDANSETRON 4MG: 5 days supply | Qty: 10 | Fill #0 | Status: CP

## 2021-12-01 NOTE — Progress Notes (Signed)
Mariah [name] is a 65 year old female who presents with the following problems:      C/o nausea  Dizziness  Body aches  High bp  Headache - feels different than migrain  Sx started 4 days ago  Dry cough  Runny nose  Sneezing  No sore throat  Subjective fever before not now  Not eqating or drinking much  Some wheezing  Feels better today but feeling weak  No SOB    Meds:  Losartan - 100  hctz -   Zyrtec  nyquil      Encounter diagnoses:  Viral URI    Patient Active Problem List:     History of bariatric surgery     LATERAL FOOT PAIN     Benign hypertensive heart disease without heart failure     Alkaline phosphatase elevation     Calculus of kidney     Gastritis     Allergic rhinitis due to other allergen     Osteoporosis     Murmur heart     Family history of throat cancer     Vitamin D deficiency     Subclavian artery stenosis, left (HCC)     Schatzki's ring     Duodenal ulcer, unspecified as acute or chronic, without hemorrhage, perforation, or obstruction     Mild depression     Glaucoma suspect of both eyes     Hyperopia with presbyopia of both eyes     Abnormal CT lung screening     Elevated LFTs     COVID-19 virus infection     SBO (small bowel obstruction) (HCC)     Validated blood pressure cuff for home monitoring       Review of Patient's Allergies indicates:   Lisinopril              Cough   Heparin                 Rash    Comment:local rash seen during hospitalization Feb 2008             Received lovenox during hospitalization Oct 2021             w/o issue  OBJECTIVE:    The patient is speaking in full sentences without any apparent respiratory difficulty or distress. The patient's speech is clear and coherent and the patient is goal directed without evidence of confusion.   S1 and S2 normal, no murmurs, clicks, gallops or rubs. Regular rate and rhythm. Chest is clear; no wheezes or rales. No edema or JVD.  Ears normal. Throat and pharynx normal. Neck supple. No adenopathy or masses  in the neck or supraclavicular regions. Sinuses non tender.  The abdomen is soft without tenderness, guarding, mass, rebound or organomegaly. Bowel sounds are normal. No CVA tenderness or inguinal adenopathy noted.    Assessment and Plan:  (J06.9) Viral URI  Comment: Most likely viral URI but certainly could be influenza or COVID.  I will do a respiratory panel today.  Plan: RESPIRATORY PANEL BASIC OUTPT, RESPIRATORY         PANEL BASIC OUTPT                I spent a total of 30 minutes on this visit on the date of service (total time includes all activities performed on the date of service)        All the patients questions were answered.    The patient was  ready to learn and no apparent learning or adherence barriers were identified. I explained the diagnosis and treatment plan, and the patient expressed understanding of the content. I attempted to answer any questions regarding the diagnosis and the proposed treatment.   Mariah [name] has been advised to call or return with any worsening or new problems

## 2021-12-02 ENCOUNTER — Telehealth (HOSPITAL_BASED_OUTPATIENT_CLINIC_OR_DEPARTMENT_OTHER): Payer: Self-pay | Admitting: Registered Nurse

## 2021-12-02 NOTE — Telephone Encounter (Signed)
Called pt via [place] interpretor ID # [phone]  Confirmed name and dob  Discussed Results of Respiratory Panel     I have recommended the following symptomatic treatment for influenza: push fluids, use a vaporizer, use Tylenol or OTC NSAID's (Advil, Alleve etc) for fever or achiness, OTC cough suppressant/decongestants such as Robitussin CF and rest. The symptoms usually resolve in 4-6 days. Call if symptoms persist or develops new symptoms such as wheezing or shortness of breath.    Will route to PCP    Harrison Mons, RN 12/02/21 4:04 PM                  Contains abnormal dataRESPIRATORY PANEL BASIC OUTPT  Order: [id number]   Status: Final result    Visible to patient: No (inaccessible in [place])    Dx: Viral URI   Specimen Information: Nasopharyngeal; Swab    0 Result Notes  Component 1 d ago    SARS-COV-2 Negative for SARS-CoV-2(2019 novel coronavirus)by RT-PCR   methodology.   The [facility] Fusion SARS-CoV-2 assay is a real-time RT-PCR in   vitro diagnostic test intended for the qualitative detection   of RNA from SARS-CoV-2 virus. Negative results do not   preclude SARS-CoV-2 infection and should not be used as the   sole basis for patient management decisions. Negative   results must be combined with other clinical observations,   patient history, and epidemiological information.   This test has been authorized by the FDA under an Emergency   Use Authorization (EUA) for use by authorized laboratories.    INFLUENZA A Positive for influenza A by PCR methodology.    INFLUENZA A influenza AAbnormal    INFLUENZA B Negative for Influenza B by PCR methodology.    RESPIRATORY SYNCYTIAL VIRUS Negative for Respiratory Syncytial Virus by PCR methodology

## 2022-01-10 ENCOUNTER — Telehealth (HOSPITAL_BASED_OUTPATIENT_CLINIC_OR_DEPARTMENT_OTHER): Payer: Self-pay | Admitting: Internal Medicine

## 2022-01-10 ENCOUNTER — Ambulatory Visit (HOSPITAL_BASED_OUTPATIENT_CLINIC_OR_DEPARTMENT_OTHER): Payer: Self-pay

## 2022-01-10 DIAGNOSIS — S92911A Unspecified fracture of right toe(s), initial encounter for closed fracture: Secondary | ICD-10-CM

## 2022-01-10 DIAGNOSIS — S6290XA Unspecified fracture of unspecified wrist and hand, initial encounter for closed fracture: Secondary | ICD-10-CM
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
## 2022-01-10 NOTE — Addendum Note (Signed)
Addended by: Lawernce Pitts on: 01/10/2022 07:04 PM     Modules accepted: Orders
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
## 2022-01-11 NOTE — Telephone Encounter (Addendum)
Called daughter via [place] speaking RN  Confirmed name and dob of pt    Reiterated provider's message   Dtr verbalizes understanding    Appt scheduled with PCP on 1/27 at [facility]

## 2022-01-13 ENCOUNTER — Ambulatory Visit (HOSPITAL_BASED_OUTPATIENT_CLINIC_OR_DEPARTMENT_OTHER): Payer: Self-pay

## 2022-01-13 ENCOUNTER — Other Ambulatory Visit: Payer: Self-pay

## 2022-01-13 ENCOUNTER — Encounter (HOSPITAL_BASED_OUTPATIENT_CLINIC_OR_DEPARTMENT_OTHER): Payer: Self-pay | Admitting: Internal Medicine

## 2022-01-13 ENCOUNTER — Ambulatory Visit: Payer: No Typology Code available for payment source | Attending: Internal Medicine | Admitting: Internal Medicine

## 2022-01-13 VITALS — BP 188/87 | HR 58 | Wt 204.6 lb

## 2022-01-13 DIAGNOSIS — I119 Hypertensive heart disease without heart failure: Secondary | ICD-10-CM | POA: Insufficient documentation

## 2022-01-13 DIAGNOSIS — S0181XD Laceration without foreign body of other part of head, subsequent encounter: Secondary | ICD-10-CM | POA: Diagnosis present

## 2022-01-13 DIAGNOSIS — S6292XD Unspecified fracture of left wrist and hand, subsequent encounter for fracture with routine healing: Secondary | ICD-10-CM | POA: Insufficient documentation

## 2022-01-13 DIAGNOSIS — R1013 Epigastric pain: Secondary | ICD-10-CM | POA: Diagnosis present

## 2022-01-13 MED ORDER — TIZANIDINE HCL 2 MG PO TABS
2.0000 mg | ORAL_TABLET | Freq: Four times a day (QID) | ORAL | 0 refills | Status: DC | PRN
Start: 2022-01-13 — End: 2022-03-24

## 2022-01-13 MED ORDER — FAMOTIDINE 40 MG PO TABS
40.0000 mg | ORAL_TABLET | Freq: Two times a day (BID) | ORAL | 0 refills | Status: DC
Start: 2022-01-13 — End: 2022-03-02

## 2022-01-13 MED FILL — TIZANIDINE 2MG: 22 days supply | Qty: 90 | Fill #0 | Status: CP

## 2022-01-13 MED FILL — FAMOTIDINE 40MG: 30 days supply | Qty: 60 | Fill #0 | Status: CP

## 2022-01-13 NOTE — Telephone Encounter (Signed)
-----   Message from Bayard Hugger sent at 01/13/2022  3:08 PM EST -----  Regarding: new patient-urgent appointment  Contact: [phone]
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
## 2022-01-13 NOTE — Telephone Encounter (Signed)
Timeframe for patient to be scheduled: 02/02    Provider to be scheduled with if applicable: Dr. Joylene John, [facility]    Who is responsible for scheduling: AC II    DX after clinical review: Closed fracture of left hand with routine healing

## 2022-01-17 ENCOUNTER — Emergency Department
Admission: AD | Admit: 2022-01-17 | Discharge: 2022-01-17 | Disposition: A | Discharge status: Home / Self Care | Payer: No Typology Code available for payment source | Source: Ambulatory Visit | Attending: Emergency Medicine | Admitting: Emergency Medicine

## 2022-01-17 ENCOUNTER — Encounter (HOSPITAL_BASED_OUTPATIENT_CLINIC_OR_DEPARTMENT_OTHER): Payer: Self-pay

## 2022-01-17 DIAGNOSIS — Z4802 Encounter for removal of sutures: Secondary | ICD-10-CM | POA: Diagnosis not present

## 2022-01-17 DIAGNOSIS — S0181XD Laceration without foreign body of other part of head, subsequent encounter: Secondary | ICD-10-CM | POA: Diagnosis not present

## 2022-01-17 MED ORDER — BACITRACIN 500 UNIT/GM EX OINT
TOPICAL_OINTMENT | Freq: Two times a day (BID) | CUTANEOUS | 0 refills | Status: AC
Start: 2022-01-17 — End: 2022-01-27
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
## 2022-01-17 NOTE — Narrator Note (Signed)
Patient Disposition  Patient education for diagnosis, medications, activity, diet and follow-up.  Patient left UC 3:22 PM.  Patient rep received written instructions.    Interpreter to provide instructions: No    Patient belongings with patient: YES    Have all existing LDAs been addressed? N/A    Have all IV infusions been stopped? N/A    Destination: Home
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
## 2022-01-17 NOTE — Narrator Note (Signed)
Patient examined by PA. Sutures removed by PA. Patient discharged.

## 2022-01-17 NOTE — Discharge Instructions (Signed)
POR FAVOR REVISE    Por favor, revise as seguintes informaes fornecidas relacionadas  visita de hoje.      [place] para sutura.  CUIDADOS ADICIONAIS:  Waynard Reeds de cicatriz/infeco: Por favor, mantenha a Loss adjuster, chartered. Voc deve aplicar protetor solar (SPF 30 ou superior) na ferida por 6 meses. Voc pode aplicar loes de vitamina E, pois isso pode reduzir a chance de cicatrizes. No mergulhe a [employer], lagos, rios, piscinas ou gua suja at [employer] totalmente curada, pois isso pode causar infeco. Se a ferida ficar vermelha, quente, inchada ou apresentar secreo purulenta, retorne ao pronto-socorro imediatamente.    QUANDO VOC DEVE SER ATENDIDO?  Ligue para o seu mdico (ou procure atendimento) e seja atendido nos prximos 5 dias para uma reavaliao se desenvolver febre, vmito, aumento da dor, inchao, vermelhido dolorosa da ferida, piora dos sintomas, novos sintomas ou voc so incapazes de agendar cuidados de acompanhamento.. Se voc no tem um mdico de cuidados primrios ou [place] de transferir [place], ligue para [phone] para marcar uma consulta.    [place]?  Retorne ao pronto-socorro se apresentar febre, vmito, aumento da dor, inchao, vermelhido dolorosa da ferida, piora dos sintomas, novos sintomas ou incapacidade de agendar atendimento de acompanhamento.        PLEASE REVIEW    Please review the following information being provided related to today's visit.      DIAGNOSIS & TREATMENT:  You were seen in a [employer] for suture.  FURTHER CARE:  Scar/Infection Prevention: Please keep the laceration out of direct sunlight. You should apply sun screen (SPF 30 or higher) to the wound for 6 months. You may apply Vitamin E lotions as this may reduce the chance of scarring.  Do not submerge your wound in any oceans,  lakes, rivers, swimming pools, or dirty dishwater until fully healed as this could introduce infection.  If the wound becomes red, hot, swollen or you express purulent discharge please return to the emergency department immediately.    WHEN SHOULD YOU BE SEEN NEXT?  Please call your doctor (or seek care) and be seen with in the next 5 days for re-evaluation if you develop fever, vomiting, increased pain, swelling, painful redness of the wound, your symptoms worsen, you get new symptoms or you are unable to schedule follow up care.. If you do not have a primary care doctor or would like to transfer your primary care to [facility], please call [phone] to set one up an appointment.    WHEN SHOULD YOU SEEK CARE?  Please return to the emergency room if you develop fever, vomiting, increased pain, swelling, painful redness of the wound, your symptoms worsen, you get new symptoms or you are unable to schedule follow up care.

## 2022-01-18 ENCOUNTER — Other Ambulatory Visit (HOSPITAL_BASED_OUTPATIENT_CLINIC_OR_DEPARTMENT_OTHER): Payer: Self-pay

## 2022-01-18 DIAGNOSIS — S62347A Nondisplaced fracture of base of fifth metacarpal bone. left hand, initial encounter for closed fracture: Secondary | ICD-10-CM | POA: Insufficient documentation

## 2022-01-18 DIAGNOSIS — S6292XD Unspecified fracture of left wrist and hand, subsequent encounter for fracture with routine healing: Secondary | ICD-10-CM

## 2022-01-19 ENCOUNTER — Encounter (HOSPITAL_BASED_OUTPATIENT_CLINIC_OR_DEPARTMENT_OTHER): Payer: Self-pay | Admitting: Hand Surgery

## 2022-01-19 ENCOUNTER — Other Ambulatory Visit: Payer: Self-pay

## 2022-01-19 ENCOUNTER — Ambulatory Visit (HOSPITAL_BASED_OUTPATIENT_CLINIC_OR_DEPARTMENT_OTHER): Payer: Non-veteran care | Admitting: Hand Surgery

## 2022-01-19 ENCOUNTER — Ambulatory Visit
Admission: RE | Admit: 2022-01-19 | Discharge: 2022-01-19 | Disposition: A | Discharge status: Home / Self Care | Payer: Non-veteran care | Source: Ambulatory Visit | Attending: Hand Surgery | Admitting: Hand Surgery

## 2022-01-19 DIAGNOSIS — S62347A Nondisplaced fracture of base of fifth metacarpal bone. left hand, initial encounter for closed fracture: Secondary | ICD-10-CM | POA: Diagnosis present

## 2022-01-19 DIAGNOSIS — S62345A Nondisplaced fracture of base of fourth metacarpal bone, left hand, initial encounter for closed fracture: Secondary | ICD-10-CM | POA: Insufficient documentation

## 2022-01-19 DIAGNOSIS — S6292XD Unspecified fracture of left wrist and hand, subsequent encounter for fracture with routine healing: Secondary | ICD-10-CM

## 2022-01-19 DIAGNOSIS — S62315A Displaced fracture of base of fourth metacarpal bone, left hand, initial encounter for closed fracture: Secondary | ICD-10-CM | POA: Insufficient documentation

## 2022-01-19 DIAGNOSIS — S62343A Nondisplaced fracture of base of third metacarpal bone, left hand, initial encounter for closed fracture: Secondary | ICD-10-CM | POA: Diagnosis present
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
## 2022-01-19 NOTE — Progress Notes (Signed)
Left gauntlet cast applied by Ocie Bob LPN  under the direction of Dr.Mulley    C/S/M positive  after cast applied.    Cast/splint care Instructions given to and reviewed with patient.    Patient informed to call office if any pain or swelling occur.    Cast molded/checked by Florencia Reasons PA-C    Ocie Bob, LPN, 06/17/5952
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
## 2022-01-24 ENCOUNTER — Ambulatory Visit: Payer: No Typology Code available for payment source | Attending: Podiatrist | Admitting: Podiatrist

## 2022-01-24 ENCOUNTER — Encounter (HOSPITAL_BASED_OUTPATIENT_CLINIC_OR_DEPARTMENT_OTHER): Payer: Self-pay | Admitting: Podiatrist

## 2022-01-24 ENCOUNTER — Other Ambulatory Visit: Payer: Self-pay

## 2022-01-24 DIAGNOSIS — S90121A Contusion of right lesser toe(s) without damage to nail, initial encounter: Secondary | ICD-10-CM
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
## 2022-01-26 ENCOUNTER — Telehealth (HOSPITAL_BASED_OUTPATIENT_CLINIC_OR_DEPARTMENT_OTHER): Payer: Self-pay

## 2022-01-26 ENCOUNTER — Encounter (HOSPITAL_BASED_OUTPATIENT_CLINIC_OR_DEPARTMENT_OTHER): Payer: Self-pay | Admitting: Physician Assistant

## 2022-01-26 NOTE — Telephone Encounter (Signed)
Phone call to patient and informed her that her Zachary is ready. Patient will be coming to pick up her letter tomorrow at the front desk.

## 2022-01-26 NOTE — Telephone Encounter (Signed)
Spoke to patient with the help of a [place]. 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 She mainly cleans kitchens and bathrooms, which involves lots of scrubbing, mopping, lifting/moving things around, lots of using her hands and arms. She would like the letter to explain what kind of surgery she had, her limitations/restrictions, and approximately when she can return to work. She stated that she would like for the company to save her position for her that's why she would like to send them the letter as soon as possible.  RN will contact patient when the letter is ready.

## 2022-01-26 NOTE — Telephone Encounter (Signed)
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   Also, if she has a different job, what are her duties. She has 2 broken bones so in most cases would be out of work for 2-4 months with her injury.    Thx for getting some more info  Megan    ----- Message -----  From: Fenton Foy, RN  Sent: 01/25/2022   3:48 PM EST  To: Ann Maki, PA-C, Debra A. Joylene John, MD, #  Subject: Melton AlarJoylene John patient - work status note            Patient had a visit on 02/02 and would like a letter with her work status. I will follow up with patient once letter is ready.    Thank you.  Mee Hives  ----- Message -----  From: Georgeanne Nim  Sent: 01/25/2022  12:17 PM EST  To: [place] Rn Pool  Subject: [place] patient - work status note                Patient is calling to request a work status note from her last visit on 02/02. Patient would like a call when that note is ready for pick up.

## 2022-01-30 ENCOUNTER — Ambulatory Visit (HOSPITAL_BASED_OUTPATIENT_CLINIC_OR_DEPARTMENT_OTHER): Payer: Self-pay | Admitting: Internal Medicine

## 2022-02-02 MED FILL — LOSARTAN POT 50MG: 90 days supply | Qty: 180 | Fill #2

## 2022-02-06 MED FILL — FAMOTIDINE 40MG: 30 days supply | Qty: 60 | Fill #0

## 2022-02-07 ENCOUNTER — Encounter (HOSPITAL_BASED_OUTPATIENT_CLINIC_OR_DEPARTMENT_OTHER): Payer: Self-pay

## 2022-02-07 ENCOUNTER — Other Ambulatory Visit: Payer: Self-pay

## 2022-02-07 ENCOUNTER — Ambulatory Visit
Admission: RE | Admit: 2022-02-07 | Discharge: 2022-02-07 | Disposition: A | Discharge status: Home / Self Care | Payer: No Typology Code available for payment source | Attending: Internal Medicine | Admitting: Internal Medicine

## 2022-02-07 DIAGNOSIS — Z1239 Encounter for other screening for malignant neoplasm of breast: Secondary | ICD-10-CM

## 2022-02-07 DIAGNOSIS — Z1231 Encounter for screening mammogram for malignant neoplasm of breast: Secondary | ICD-10-CM | POA: Insufficient documentation

## 2022-02-09 ENCOUNTER — Ambulatory Visit
Admission: RE | Admit: 2022-02-09 | Discharge: 2022-02-09 | Disposition: A | Discharge status: Home / Self Care | Payer: Non-veteran care | Source: Ambulatory Visit | Attending: Physician Assistant | Admitting: Physician Assistant

## 2022-02-09 ENCOUNTER — Other Ambulatory Visit: Payer: Self-pay

## 2022-02-09 ENCOUNTER — Ambulatory Visit (HOSPITAL_BASED_OUTPATIENT_CLINIC_OR_DEPARTMENT_OTHER): Payer: Non-veteran care | Admitting: Hand Surgery

## 2022-02-09 ENCOUNTER — Ambulatory Visit: Payer: Non-veteran care | Attending: Hand Surgery

## 2022-02-09 DIAGNOSIS — S62347D Nondisplaced fracture of base of fifth metacarpal bone. left hand, subsequent encounter for fracture with routine healing: Secondary | ICD-10-CM

## 2022-02-09 DIAGNOSIS — S62347A Nondisplaced fracture of base of fifth metacarpal bone. left hand, initial encounter for closed fracture: Secondary | ICD-10-CM | POA: Diagnosis present

## 2022-02-09 DIAGNOSIS — S62345D Nondisplaced fracture of base of fourth metacarpal bone, left hand, subsequent encounter for fracture with routine healing: Secondary | ICD-10-CM | POA: Diagnosis present

## 2022-02-09 NOTE — Progress Notes (Signed)
02/09/22 1100   Language Information   Language of Care Portuguese   Interpreter Yes   Name/ID Daughter   Precautions   Precautions Yes  (NWB, no heavy lifting, splint wear)   [place] OT   Visit   Visit number 1   POC Due date 03/09/22   Time Calculation   Start Time 1130   Stop Time 1230   Time Calculation (min) 60 min   Pain   Pain Score 8    Pain Type Acute pain   Pain Location Hand   Pain Orientation Left   Patient's Stated Pain Goal No pain   Ther Exercise   Therapeutic Exercise? Yes   Exercise Active tendon glides   Reps 10   Sets 1   Ther Exercise 2   Exercise Wrist tenodesis   Reps 2 10   Sets 2 1   Patient Education   What was taught? OT POC, HEP (Tendon glides, wrist tenodesis), splint wear and care, activity precautions   Method Verbal;Demo;Practice;Written   Patient comprehension Yes     Pattricia Boss, [place], Lic # [id number]

## 2022-02-09 NOTE — Progress Notes (Signed)
FOLLOW UP    Interpreter: Patient declined interpreter and daughter assisted with [place] interpretation per patient's request.    Date of injury: 01/09/2022    CC: Left hand pain   DX: Metacarpal base fracture of the left small and ring fingers     HPI:   This is a 66 yr old female who presents for reevaluation of metacarpal base fracture to left small and ring fingers.  She has been in a ulnar gauntlet cast including her middle, ring and small fingers.  She is now 5 weeks out from the date of injury.  She complains of mild pain in the hand pointing over the sites of the fractures.  Reports some stiffness in the fingers.  Denies any numbness or paresthesias.    She has been out of work since the date of the injury.      Occupation: housecleaner      Physical exam:  General/neuro: Alert and oriented, no acute distress, cooperative  Musculoskeletal:   Left hand evaluation shows no gross bony deformities. Skin is warm, dry and intact. There is mild edema in the hand.  No erythema or ecchymosis.  Mildly TTP along the base of the small and ring finger metacarpal.  Full extension of all 5 digits.  Slightly limited flexion of the middle, ring and small fingers secondary to stiffness from the cast.  Thumb and index fingers have full range of motion.  Neurovascularly intact distally. +2 radial pulses.      Imaging: X-rays of the left hand reveal healing of the comminuted displaced fracture at the base of the left small finger metacarpal.  There is healing of the nondisplaced fracture at the base of the ring finger metacarpal.    Imaging was viewed and interpreted by myself and Dr. Joylene John during this visit.       Assessment/plan:  66 yr old F who is 5 weeks out from left ring and small finger metacarpal base fractures.  There is evidence of healing on x-ray today.  She is sent to OT for fabrication of ulnar gutter splint and to start range of motion.  She cannot do any lifting, pushing or pulling with the left hand.  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  We will follow-up with her in 4 weeks with new x-rays on arrival of the left hand.  She will likely be cleared to return to work after that visit.    Patient was seen, discussed and examined with Dr. Joylene John who formulated the assessment and plan. Please see their dictated note as well.      Rosario Adie, PA-C, 02/09/2022
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
## 2022-02-13 ENCOUNTER — Other Ambulatory Visit: Payer: Self-pay

## 2022-02-14 NOTE — Progress Notes (Signed)
66 year old female 5 weeks out from left ring and small finger metacarpal base fractures.  She is healing well.  She is sent to OT for fabrication of an ulnar gutter splint and will start range of motion work with him.  Please see full note of Rosario Adie, PA-C    MD ATTESTATION  I, Dr. Fredderick Severance, have seen and examined this patient on the date of service, confirmed key exam findings, reviewed any pertinent studies and images, and formulated the plan of care. I agree with the note as outlined above. I spent a substantive portion of time with the patient.    Estrella Deeds, MD, 02/09/2022

## 2022-02-15 DIAGNOSIS — S62347A Nondisplaced fracture of base of fifth metacarpal bone. left hand, initial encounter for closed fracture: Secondary | ICD-10-CM | POA: Insufficient documentation

## 2022-02-16 ENCOUNTER — Other Ambulatory Visit: Payer: Self-pay

## 2022-02-16 ENCOUNTER — Ambulatory Visit (HOSPITAL_BASED_OUTPATIENT_CLINIC_OR_DEPARTMENT_OTHER): Payer: No Typology Code available for payment source

## 2022-02-16 DIAGNOSIS — S62347A Nondisplaced fracture of base of fifth metacarpal bone. left hand, initial encounter for closed fracture: Secondary | ICD-10-CM | POA: Diagnosis present

## 2022-02-16 NOTE — Progress Notes (Signed)
02/16/22 0900   Language Information   Language of Care Portuguese   Interpreter Yes   Name/ID [phone]   Precautions   Precautions Yes   Rehab Discipline   Rehab Discipline OT   Visit   Visit number 2   POC Due date 03/09/22   Time Calculation   Start Time 1000   Stop Time 1030   Time Calculation (min) 30 min   Pain   Pain Score 1    Pain Location Hand   Pain Orientation Left   Manual Therapy   Manual Therapy Yes   Technique Retrograde massage   Manual Therapy 2   Technique Compression sleeve fitting   Ther Exercise   Therapeutic Exercise? Yes   Exercise Active hook fist   Reps 10   Sets 2   Ther Exercise 2   Exercise Active tabletop fist   Reps 2 10   Sets 2 2   Ther Exercise 3   Exercise 3 Reverse blocking   Reps 3 10   Sets 3 2   Ther Exercise 4   Exercise 4 Composite flexion   Reps 4 10   Sets 4 2   Ther Exercise 5   Exercise 5 Gentle foam grasping  (Yellow foam)   Reps 5 10   Sets 5 2   Ther Exercise 6   Exercise 6 Wrist tenodesis   Reps 6 10   Sets 6 2   Patient Education   What was taught? OT POC, HEP (Tendon glides, wrist tenodesis, yellow foam squeezes), splint wear and care, activity precautions, sleep positioning, scar massage for forehead   Method Verbal;Demo;Practice;Written   Patient comprehension Yes     Pattricia Boss, [place], Lic # [id number]

## 2022-02-16 NOTE — Progress Notes (Signed)
Date of Service:02/16/2022  Diagnosis: Closed nondisplaced fracture of base of fifth metacarpal bone of left hand, initial encounter  (primary encounter diagnosis)  DOI: 01/09/22    S: Pt reports she is doing very well. Pt states that pain has decreased significantly and she has not had any difficulties with daily activities while in the splint. Pt notes that when she does experience pain its comes from her forehead scar if she rolls onto it while sleeping.     Pain: 1/10        O: Refer to OT Rehabilitation Treatment Flowsheet.     02/09/22   DIGIT MP (10+hr/80) PIP   (0/100) DIP (10+h/90) Composite Flexion  (cm from [facility])   L SF 0/35 55 30 3.5   L RF -25/52 70 25 4.0      02/16/22   DIGIT MP (10+hr/80) PIP   (0/100) DIP (10+h/90) Composite Flexion  (cm from [facility])   L SF 83 74 62 0   L RF 90 82 55  0     L Wrist Flexion: 51 degrees   L Wrist Extension: 60 degrees    Figure 8 edema R/L:  40.5 cm / 41.8 cm     A: Pt presents to OT with significantly improved AROM and pain. Pt is able to make full composite fist and full hook fist. Wrist flexion AROM has also significantly improved. As pt is <6 weeks from initial injury, AROM still emphasized today. Light foam grasping initiated as tolerated with pt reporting no issues/difficulties. Light gripping added to HEP. Edema measured today with pt having increased edema on affected hand. Retrograde massage initiated and compression sleeve provided for night time edema management. Additionally, scar massage for forehead scar discussed as well as sleep positioning to avoid forehead pain. Pt verbalizes understanding and will trial at home.      P: Continue OT plan of care targeting gains towards prior level of function. Initiate gentle strengthening as tolerated.     Mariah [name], [place], Lic # [id number]

## 2022-02-23 ENCOUNTER — Ambulatory Visit: Payer: No Typology Code available for payment source | Attending: Hand Surgery

## 2022-02-23 ENCOUNTER — Other Ambulatory Visit: Payer: Self-pay

## 2022-02-23 DIAGNOSIS — S62347A Nondisplaced fracture of base of fifth metacarpal bone. left hand, initial encounter for closed fracture: Secondary | ICD-10-CM | POA: Diagnosis not present

## 2022-02-23 NOTE — Progress Notes (Signed)
02/23/22 0900   Language Information   Language of Care Portuguese   Interpreter Yes   Name/ID [place] OT   Visit   Visit number 3   POC Due date 03/09/22   Time Calculation   Start Time 1000   Stop Time 1030   Time Calculation (min) 30 min   Pain   Pain Score 2    Ther Exercise   Therapeutic Exercise? Yes   Exercise Wrist tenodesis   Reps 10   Sets 1   Ther Exercise 2   Exercise Wrist AROM flexion/extension #1   Reps 2 10   Sets 2 2   Ther Exercise 3   Exercise 3 Pronation/Supination with hammer   Reps 3 10   Sets 3 2   Ther Exercise 4   Exercise 4 Putty grasping  ([place]ie[place])   Reps 4 12   Sets 4 2   Ther Exercise 5   Exercise 5 Putty rolling   Reps 5 2   Holds 5 1 minute   Ther Exercise 6   Exercise 6 Dowel presses into putty   Reps 6 10   Sets 6 2   Ther Exercise 7   Exercise 7 Lumbrical press   Reps 7 10   Sets 7 2   Patient Education   What was taught? OT POC, HEP (putty squeezes, wrist tenodesis), splint wear and care, activity precautions, sleep positioning, scar massage for forehead   Method Verbal;Demo;Practice;Written   Patient comprehension Yes     Pattricia Boss, [place], Lic # [id number]

## 2022-02-23 NOTE — Progress Notes (Signed)
Date of Service:02/23/2022  Diagnosis: Closed nondisplaced fracture of base of fifth metacarpal bone of left hand, initial encounter  (primary encounter diagnosis)  DOI: 01/09/22    S: Pt reports that she has continued to do well. Pt states that she has not been wearing her splint during night and for most of the day without pain. Pt notes that forehead scar pain is better as well and that it hasn't hurt while sleeping lately.      2/10 pain at worst       O: Refer to [facility].   Edema:   Circumferential P1 R/L: 6.3 cm / 6.4 cm  Circumferential MPs R/L: 19.8 cm / 19.8 cm     Date      Right 02/23/22 Left 02/23/22   Grip Strength (lbs) 58 22         A: Pt arrives with continued good motion of digit. Pt has not been wearing splint due to discomfort so splint adjusted today to remove wrist support. Pt notes increased comfort and will wear splint at night or during moderate/heavy tasks for the next 2 weeks. Grip strength assessed today as pt is >6 weeks from DOI. PREs and graded weight bearing also initiated today. Pt able to tolerate putty rolling with graded weight bearing well following moderate verbal cueing for intensity of pressure. HEP progressed to include putty squeezes and weighted wrist flexion/extension.       P: Continue OT plan of care targeting gains towards prior level of function. Continue with PREs and graded weight bearing as tolerated.     Pattricia Boss, [place], Lic # [id number]

## 2022-02-27 MED FILL — HYDROCHLOROT 25MG: 90 days supply | Qty: 90 | Fill #2

## 2022-02-27 MED FILL — LOSARTAN POT 50MG: 90 days supply | Qty: 180 | Fill #2

## 2022-02-27 MED FILL — FAMOTIDINE 40MG: 30 days supply | Qty: 60 | Fill #0

## 2022-03-02 ENCOUNTER — Ambulatory Visit (HOSPITAL_BASED_OUTPATIENT_CLINIC_OR_DEPARTMENT_OTHER): Payer: No Typology Code available for payment source

## 2022-03-02 ENCOUNTER — Encounter (HOSPITAL_BASED_OUTPATIENT_CLINIC_OR_DEPARTMENT_OTHER): Payer: Self-pay | Admitting: Internal Medicine

## 2022-03-02 ENCOUNTER — Ambulatory Visit: Payer: No Typology Code available for payment source | Attending: Internal Medicine | Admitting: Internal Medicine

## 2022-03-02 ENCOUNTER — Other Ambulatory Visit: Payer: Self-pay

## 2022-03-02 VITALS — BP 174/81 | HR 75 | Temp 97.3°F | Wt 204.0 lb

## 2022-03-02 DIAGNOSIS — R5383 Other fatigue: Secondary | ICD-10-CM | POA: Diagnosis present

## 2022-03-02 DIAGNOSIS — R1013 Epigastric pain: Secondary | ICD-10-CM | POA: Diagnosis present

## 2022-03-02 DIAGNOSIS — I119 Hypertensive heart disease without heart failure: Secondary | ICD-10-CM

## 2022-03-02 DIAGNOSIS — Z9884 Bariatric surgery status: Secondary | ICD-10-CM | POA: Diagnosis present

## 2022-03-02 DIAGNOSIS — F5101 Primary insomnia: Secondary | ICD-10-CM | POA: Diagnosis present

## 2022-03-02 LAB — CBC, PLATELET & DIFFERENTIAL
ABSOLUTE BASO COUNT: 0.1 10*3/uL (ref 0.0–0.1)
ABSOLUTE EOSINOPHIL COUNT: 0.1 10*3/uL (ref 0.0–0.8)
ABSOLUTE IMM GRAN COUNT: 0.02 10*3/uL (ref 0.00–0.10)
ABSOLUTE LYMPH COUNT: 1.7 10*3/uL (ref 0.6–5.9)
ABSOLUTE MONO COUNT: 0.4 10*3/uL (ref 0.2–1.4)
ABSOLUTE NEUTROPHIL COUNT: 4.6 10*3/uL (ref 1.6–8.3)
ABSOLUTE NRBC COUNT: 0 10*3/uL (ref 0.0–0.0)
BASOPHIL %: 0.9 % (ref 0.0–1.2)
EOSINOPHIL %: 1.3 % (ref 0.0–7.0)
HEMATOCRIT: 41.7 % (ref 34.1–44.9)
HEMOGLOBIN: 12.3 g/dL (ref 11.2–15.7)
IMMATURE GRANULOCYTE %: 0.3 % (ref 0.0–1.0)
LYMPHOCYTE %: 24.4 % (ref 15.0–54.0)
MEAN CORP HGB CONC: 29.5 g/dL — ABNORMAL LOW (ref 31.0–37.0)
MEAN CORPUSCULAR HGB: 26.1 pg (ref 26.0–34.0)
MEAN CORPUSCULAR VOL: 88.3 fl (ref 80.0–100.0)
MEAN PLATELET VOLUME: 10 fL (ref 8.7–12.5)
MONOCYTE %: 5.4 % (ref 4.0–13.0)
NEUTROPHIL %: 67.7 % (ref 40.0–75.0)
NRBC %: 0 % (ref 0.0–0.0)
PLATELET COUNT: 369 10*3/uL (ref 150–400)
RBC DISTRIBUTION WIDTH STD DEV: 47.3 fL — ABNORMAL HIGH (ref 35.1–46.3)
RED BLOOD CELL COUNT: 4.72 M/uL (ref 3.90–5.20)
WHITE BLOOD CELL COUNT: 6.8 10*3/uL (ref 4.0–11.0)

## 2022-03-02 LAB — COMPREHENSIVE METABOLIC PANEL
ALANINE AMINOTRANSFERASE: 26 U/L (ref 12–45)
ALBUMIN: 4.5 g/dL (ref 3.4–5.2)
ALKALINE PHOSPHATASE: 196 U/L — ABNORMAL HIGH (ref 45–117)
ANION GAP: 12 mmol/L (ref 10–22)
ASPARTATE AMINOTRANSFERASE: 27 U/L (ref 8–34)
BILIRUBIN TOTAL: 0.3 mg/dL (ref 0.2–1.0)
BUN (UREA NITROGEN): 16 mg/dL (ref 7–18)
CALCIUM: 10.4 mg/dL (ref 8.5–10.5)
CARBON DIOXIDE: 26 mmol/L (ref 21–32)
CHLORIDE: 104 mmol/L (ref 98–107)
CREATININE: 0.7 mg/dL (ref 0.4–1.2)
ESTIMATED GLOMERULAR FILT RATE: >60 mL/min (ref >60)
Glucose Random: 89 mg/dL (ref 74–160)
POTASSIUM: 4.4 mmol/L (ref 3.5–5.1)
SODIUM: 142 mmol/L (ref 136–145)
TOTAL PROTEIN: 7.9 g/dL (ref 6.4–8.2)

## 2022-03-02 LAB — IRON: IRON: 62 ug/dL (ref 50–170)

## 2022-03-02 LAB — FOLATE: FOLATE: 10.3 ng/mL (ref 4.6–?)

## 2022-03-02 LAB — VITAMIN B12: VITAMIN B12: 526 pg/mL (ref 232–1245)

## 2022-03-02 LAB — FERRITIN: FERRITIN: 44 ng/mL (ref 13–150)

## 2022-03-02 LAB — VITAMIN D,25 HYDROXY: VITAMIN D,25 HYDROXY: 20 ng/mL — ABNORMAL LOW (ref 30.0–100.0)

## 2022-03-02 LAB — THYROID SCREEN TSH REFLEX FT4: THYROID SCREEN TSH REFLEX FT4: 1.54 u[IU]/mL (ref 0.270–4.200)

## 2022-03-02 MED ORDER — ZOLPIDEM TARTRATE 5 MG PO TABS
5.0000 mg | ORAL_TABLET | Freq: Every evening | ORAL | 0 refills | Status: DC | PRN
Start: 2022-03-02 — End: 2022-03-24

## 2022-03-02 MED ORDER — FAMOTIDINE 40 MG PO TABS
40.0000 mg | ORAL_TABLET | Freq: Two times a day (BID) | ORAL | 0 refills | Status: DC
Start: 2022-03-02 — End: 2022-03-24

## 2022-03-02 MED FILL — ZOLPIDEM   5MG: 30 days supply | Qty: 30 | Fill #0

## 2022-03-02 NOTE — Addendum Note (Signed)
Addended by: Lawernce Pitts on: 03/02/2022 02:26 PM     Modules accepted: Orders
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
## 2022-03-06 LAB — VITAMIN B1, WHOLE BLOOD: VITAMIN B1, WHOLE BLOOD: 88.5 nmol/L (ref 66.5–200.0)

## 2022-03-07 ENCOUNTER — Ambulatory Visit: Payer: No Typology Code available for payment source | Attending: Internal Medicine | Admitting: Pharmacist

## 2022-03-07 ENCOUNTER — Other Ambulatory Visit: Payer: Self-pay

## 2022-03-07 ENCOUNTER — Ambulatory Visit: Payer: No Typology Code available for payment source | Attending: Internal Medicine

## 2022-03-07 DIAGNOSIS — I119 Hypertensive heart disease without heart failure: Secondary | ICD-10-CM | POA: Insufficient documentation

## 2022-03-07 DIAGNOSIS — Z9289 Personal history of other medical treatment: Secondary | ICD-10-CM | POA: Diagnosis present

## 2022-03-07 MED ORDER — CHLORTHALIDONE 25 MG PO TABS
25.0000 mg | ORAL_TABLET | Freq: Every day | ORAL | 5 refills | Status: DC
Start: 2022-03-07 — End: 2022-09-29

## 2022-03-07 MED FILL — CHLORTHALIDONE 25MG: 30 days supply | Qty: 30 | Fill #0 | Status: CP

## 2022-03-07 NOTE — Progress Notes (Signed)
HTN Management Telephone Initial Visit     Subjective:    Translator used for this visit: Yes, [place].    Mariah [name] is a 66 year old female. CONDITION(S) ADDRESSED AT THIS PHARMACOTHERAPY VISIT: List of Conditions: Hypertension/Elevated BP    Reports (+) adherence to  Losartan '100mg'$ , HCTZ '25mg'$  daily.   .   Dizziness/lightheadedness? No.      Took BP meds today: No. Takes at night.   Self monitoring blood pressure: Yes, has a home BP machine (not validated). Measures in a chair. Knows BP is high when she has a HA, nausea. Recalls following: 140/31mHg yesterday    SH: lives with husband, not working. [place]     - Smoking status: denies  - Alcohol use: denies    Diet: usually eats at home. Using garlic, salt. Denies deli meat, canned soups. Mostly uses fresh vegetables and fruits.    Objective:   Most Recent BP Reading(s)  03/02/22 : 174/81  01/17/22 : 178/87  01/13/22 : 188/87  12/01/21 : (!) 168/92  11/30/21 : 150/86      ESTIMATED GLOMERULAR FILT RATE (ML/MIN)   Date Value   03/02/2022 > 60   09/13/2021 > 60   05/09/2021 > 60     Assessment/ Recommendation:     1. Blood Pressure:   a. Last BP was above goal (<130/819mg). Next due: Next visit   b. Interventions: STOP HCTZ. START chlorthalidone '25mg'$  daily. Continue losartan '100mg'$  daily.  i. Re-check BMP at 4/7 PCP visit-will send message to PCP.  ii. Past trials: irbesartan, lisinopril (cough)  iii. Consider amlodipine as next step if needed.  c. SMBP: Home machine needs validation. Instructed to measure at least 3x/week and to record in a log.  d. Will re-assess at follow up.    2. Diet/Exercise: Encouraged to read nutrition labels at grocery stores when selecting products.    [facility]-pt requested appt today since already going to pick up meds. Encouraged patient to keep scheduled pharmacotherapy visits and to call site pharmacist with questions.

## 2022-03-07 NOTE — Progress Notes (Signed)
Pharmacotherapy Educational Visit and Blood Pressure Cuff Validation     Interpreter: Yes - *2081    Mariah [name] is a 66 year old female who presents to clinic for medication/device education with blood pressure monitor for Hypertension/Elevated BP.    See 03/07/22 pharmacotherapy televisit encounter for HTN management visit    SMBP: yes, using automatic wrist cuff, brought in today for validation   Date validated: 03/07/22 (today- see below), Brand: [facility]    Blood pressure cuff validation per AMA 2020  STEP 1: Complete the table below.  5 blood pressure readings taken using a combination of the patient's SMBP device and the office's method of blood pressure measurement.     MEASUREMENT DEVICE SYSTOLIC BLOOD PRESSURE (SBP)   A PATIENT'S 131/96 p91   B PATIENT'S 136   C OFFICE'S 141/91 p96    D PATIENT'S 146   E OFFICE'S      STEP 2:    Average measurements B and D ((B+D)/2): 141   The difference of average of B and D (above) and measurement C (Above - C): 0  The difference is: <5 mmHg: This device CAN BE USED for SMBP    ASSESSMENT/RECOMMENDATIONS  1. Patient's BP cuff has been calibrated successfully. Patient to SMBP at home.   a. If validated, Problem List updated to include ". Bpvalidationbp":  yes  2. Blood pressure self measurement education included review of cuff size, body position, and proper measurement technique.   3. SMBP: plan to start daily testing, provided log to document home readings     Other: patient stated pharmacy did not give her new medication (chlorthalidone) Called [facility] with patient and they were in process of filling it. She will pick it up after this appointment     Conditions addressed during this visit: Hypertension/Elevated BP and Hypertension SMBP Validation/Teaching    Follow-up scheduled: Pharmacotherapy HTN f/u 4/18 via TV  (PCP onsite 4/7- BMP due)    Encouraged patient to keep scheduled pharmacotherapy visits and to call site pharmacist with questions.

## 2022-03-09 ENCOUNTER — Other Ambulatory Visit: Payer: Self-pay

## 2022-03-09 ENCOUNTER — Ambulatory Visit: Payer: Non-veteran care | Attending: Hand Surgery

## 2022-03-09 DIAGNOSIS — S62347A Nondisplaced fracture of base of fifth metacarpal bone. left hand, initial encounter for closed fracture: Secondary | ICD-10-CM | POA: Diagnosis present

## 2022-03-09 NOTE — Progress Notes (Signed)
OUTPATIENT OCCUPATIONAL THERAPY   PLAN OF CARE UPDATE    Today's Date: 03/09/2022  Referring Provider: Fredderick Severance, MD   Diagnosis: Closed nondisplaced fracture of base of fifth metacarpal bone of left hand, initial encounter  (primary encounter diagnosis)      SUBJECTIVE:: Pt reports that she has been doing great, she doesn't believe that she needs therapy anymore. Pt reports her daily activities all feel normal.     Pain at worst: 0/10   Pain at best: 0/10      OBJECTIVE:  Refer to OT flow sheet.     Full, tight, composite fist achieved    AROM R - 02/09/22 L - 02/09/22 L - 03/09/22   Wrist Flexion (0-70) 60 22 60   Wrist Extension (0-80) 67 55 65   Radial Deviation (0-20) $RemoveBeforeDE'22 22 22   'XpiRUmXcqumEowz$ Ulnar Deviation (0-30) $RemoveBeforeDE'20 15 22   'vFHmEOCKDhfQWHp$ Full Fist Yes No  Yes     Date      Right Left   Grip Strength (lbs) 65 35       ASSESSMENT  Patient is a 66 year old right handed female  who has participated in 4 sessions of skilled occupational therapy.  Pt presents with improved pain, AROM, strength and functional tolerance/independence. Upon reassessment, pt with great digital motion and WFL AROM for wrist flexion/extension/RD/UD. Pt is able to complete all ADL/IADL activities with good strength and no pain. Pt still with deficits in grip strength but at this time, pt would like to continue strengthening on her own at home. Refer to flowsheet for HEP.     OT services are no longer medically necessary.      PLAN:  Discharge from OT services    RECOMMENDATIONS:  -Continue with HEP   -Contact MD with concerns or negative change in status        Rehabilitation Goals  Short Term Goals: 4 weeks  Patient to be Independent with Home Exercise Program.  - Met   Pt will be independent with light bilateral ADL tasks - Met  Pain at worst will be reduced from a 8/10 to a 5/10. - Met  Full composite fist achieved in order to hold hair brush - Met  No pain at rest - Met    Long Term Goal: 8 weeks  Patient to be Independent with Home Exercise Program.  - Met  Pt  will be independent with all vocational-related tasks - Met  Pain at worst will be no greater than 3/10 at worst - Met  Grip strength within 15 lbs of contralateral side. - Not Met    Mariah [name], [place], Lic # [id number]

## 2022-03-09 NOTE — Progress Notes (Signed)
03/09/22 0900   Language Information   Language of Care Portuguese   Interpreter Yes   Name/ID [phone]   [facility]   Visit   Visit number 4   POC Due date 04/09/22   Time Calculation   Start Time 1000   Stop Time 1020   Time Calculation (min) 20 min   Pain   Pain Score 0    Ther Exercise   Therapeutic Exercise? Yes   Exercise Putty Squeezes  (Red putty)   Reps 15   Sets 1   Ther Exercise 2   Exercise Putty grip and pull in lumbrical position  (All four fingers on putty)   Reps 2 10   Sets 2 1   Ther Exercise 3   Exercise 3 Digital abduction with thumb  (Rubber band resistance)   Reps 3 10   Sets 3 1   Ther Exercise 4   Exercise 4 Digital abduction without thumb  (Rubber band resistance)   Reps 4 10   Sets 4 1   Patient Education   What was taught? DC plan, continued HEP (putty squeezes, putty grip and pull, thumb resisted abduction with/without thumb), weight bearing as tolerated - gradual return   Method Verbal;Demo;Practice;Written   Patient comprehension Yes     Pattricia Boss, [place], Lic # [id number]

## 2022-03-10 LAB — VITAMIN A: VITAMIN A: 35.7 ug/dL (ref 22.0–69.5)

## 2022-03-13 ENCOUNTER — Ambulatory Visit
Admission: RE | Admit: 2022-03-13 | Discharge: 2022-03-13 | Disposition: A | Discharge status: Home / Self Care | Payer: Non-veteran care | Source: Ambulatory Visit | Attending: Hand Surgery | Admitting: Hand Surgery

## 2022-03-13 ENCOUNTER — Other Ambulatory Visit: Payer: Self-pay

## 2022-03-13 ENCOUNTER — Ambulatory Visit (HOSPITAL_BASED_OUTPATIENT_CLINIC_OR_DEPARTMENT_OTHER): Payer: Non-veteran care | Admitting: Hand Surgery

## 2022-03-13 DIAGNOSIS — M19042 Primary osteoarthritis, left hand: Secondary | ICD-10-CM | POA: Diagnosis present

## 2022-03-13 DIAGNOSIS — S62347D Nondisplaced fracture of base of fifth metacarpal bone. left hand, subsequent encounter for fracture with routine healing: Secondary | ICD-10-CM

## 2022-03-13 DIAGNOSIS — S62317D Displaced fracture of base of fifth metacarpal bone. left hand, subsequent encounter for fracture with routine healing: Secondary | ICD-10-CM | POA: Insufficient documentation
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
## 2022-03-24 ENCOUNTER — Other Ambulatory Visit: Payer: Self-pay

## 2022-03-24 ENCOUNTER — Ambulatory Visit: Payer: No Typology Code available for payment source | Attending: Internal Medicine | Admitting: Internal Medicine

## 2022-03-24 ENCOUNTER — Encounter (HOSPITAL_BASED_OUTPATIENT_CLINIC_OR_DEPARTMENT_OTHER): Payer: Self-pay | Admitting: Internal Medicine

## 2022-03-24 VITALS — BP 130/79 | HR 84 | Temp 98.0°F | Wt 204.0 lb

## 2022-03-24 DIAGNOSIS — E559 Vitamin D deficiency, unspecified: Secondary | ICD-10-CM | POA: Diagnosis present

## 2022-03-24 DIAGNOSIS — I119 Hypertensive heart disease without heart failure: Secondary | ICD-10-CM | POA: Diagnosis present

## 2022-03-24 DIAGNOSIS — R1013 Epigastric pain: Secondary | ICD-10-CM | POA: Diagnosis present

## 2022-03-24 DIAGNOSIS — Z9884 Bariatric surgery status: Secondary | ICD-10-CM | POA: Diagnosis present

## 2022-03-24 DIAGNOSIS — R5383 Other fatigue: Secondary | ICD-10-CM | POA: Diagnosis present

## 2022-03-24 DIAGNOSIS — F5101 Primary insomnia: Secondary | ICD-10-CM | POA: Insufficient documentation

## 2022-03-24 LAB — BASIC METABOLIC PANEL
ANION GAP: 9 mmol/L — ABNORMAL LOW (ref 10–22)
BUN (UREA NITROGEN): 18 mg/dL (ref 7–18)
CALCIUM: 10 mg/dL (ref 8.5–10.5)
CARBON DIOXIDE: 29 mmol/L (ref 21–32)
CHLORIDE: 105 mmol/L (ref 98–107)
CREATININE: 0.8 mg/dL (ref 0.4–1.2)
ESTIMATED GLOMERULAR FILT RATE: >60 mL/min (ref >60)
Glucose Random: 66 mg/dL — ABNORMAL LOW (ref 74–160)
POTASSIUM: 4.3 mmol/L (ref 3.5–5.1)
SODIUM: 142 mmol/L (ref 136–145)

## 2022-03-24 MED ORDER — ZOLPIDEM TARTRATE 5 MG PO TABS
5.00 mg | ORAL_TABLET | Freq: Every evening | ORAL | 0 refills | Status: AC | PRN
Start: 2022-03-24 — End: 2022-04-23

## 2022-03-24 MED ORDER — TIZANIDINE HCL 2 MG PO TABS
2.00 mg | ORAL_TABLET | Freq: Four times a day (QID) | ORAL | 0 refills | Status: AC | PRN
Start: 2022-03-24 — End: 2022-04-23

## 2022-03-24 MED ORDER — VITAMIN D (ERGOCALCIFEROL) 1.25 MG (50000 UT) PO CAPS
1.0000 | ORAL_CAPSULE | ORAL | 0 refills | Status: DC
Start: 2022-03-24 — End: 2022-08-29

## 2022-03-24 MED ORDER — FAMOTIDINE 40 MG PO TABS
40.00 mg | ORAL_TABLET | Freq: Two times a day (BID) | ORAL | 2 refills | Status: AC
Start: 2022-03-24 — End: 2022-06-22

## 2022-03-24 MED FILL — TIZANIDINE 2MG: 22 days supply | Qty: 90 | Fill #0

## 2022-03-24 MED FILL — VITAMIN D 50000UNT: 84 days supply | Qty: 12 | Fill #0

## 2022-03-24 MED FILL — FAMOTIDINE 40MG: 30 days supply | Qty: 60 | Fill #0
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
## 2022-03-28 NOTE — Progress Notes (Signed)
HTN Management Telephone Follow-up Visit     Subjective:    Translator used for this visit: Yes, [place].    Mariah [name] is a 66 year old female. CONDITION(S) ADDRESSED AT THIS PHARMACOTHERAPY VISIT: List of Conditions: Hypertension/Elevated BP    Reports (+) adherence to  Losartan '100mg'$  and chlorthalidone '25mg'$  daily (started last visit). Confirms stopping HCTZ as instructed.  .   Dizziness/lightheadedness? No.      Took BP meds today: No. Takes at night.   Self monitoring blood pressure: Yes, automatic wrist cuff (validated 02/2022). Recalls following: 130/62mHg, once to 150s    SH: lives with husband, not working. [place]     - Smoking status: denies  - Alcohol use: denies    Diet: usually eats at home. Using garlic, salt. Denies deli meat, canned soups. Mostly uses fresh vegetables and fruits.    Exercise: will try to start walking as weather gets a better     Objective:   Most Recent BP Reading(s)  03/24/22 : 130/79  03/02/22 : 174/81  01/17/22 : 178/87  01/13/22 : 188/87  12/01/21 : (!) 168/92      ESTIMATED GLOMERULAR FILT RATE (ML/MIN)   Date Value   03/24/2022 > 60   03/02/2022 > 60   09/13/2021 > 60     Assessment/ Recommendation:     1. Blood Pressure:   a. Last reported BP was at borderline goal (<130/874mg) but improved. Next due: Next visit   b. Interventions: Continue chlorthalidone '25mg'$  daily and losartan '100mg'$  daily.  1. BMP WNL on 03/24/22.  ii. Past trials: irbesartan, lisinopril (cough), HCTZ (replaced with chlorthalidone)  iii. Will hold off on med change now since pt plans to start walking and is close to BP goal. Consider amlodipine as next step if needed.  c. SMBP: Continue measuring at least 3x/week.  d. Will re-assess at follow up.    Planned return date: 1 month TV, reviewed BMP results with pt.. Encouraged patient to keep scheduled pharmacotherapy visits and to call site pharmacist with questions.

## 2022-04-04 ENCOUNTER — Ambulatory Visit: Payer: No Typology Code available for payment source | Attending: Internal Medicine | Admitting: Pharmacist

## 2022-04-04 DIAGNOSIS — I10 Essential (primary) hypertension: Secondary | ICD-10-CM | POA: Diagnosis not present

## 2022-04-26 NOTE — Progress Notes (Signed)
HTN Management Telephone Follow-up Visit     Subjective:    Translator used for this visit: Yes, [place].    Mariah [name] is a 66 year old female. CONDITION(S) ADDRESSED AT THIS PHARMACOTHERAPY VISIT: List of Conditions: Hypertension/Elevated BP    Reports (+) adherence to losartan '100mg'$  and chlorthalidone '25mg'$  daily.  .   Dizziness/lightheadedness? No.      Took BP meds today: Yes, takes in AM.  Self monitoring blood pressure: Yes, automatic wrist cuff (validated 02/2022). Recalls following: 720-919 systolic    SH: lives with husband, not working. Moving back to [place] next year     - Smoking status: denies  - Alcohol use: denies    Diet: usually eats at home. Using garlic, salt. Denies deli meat, canned soups. Mostly uses fresh vegetables and fruits.    Exercise: started walking daily since weather has improved    Objective:   Most Recent BP Reading(s)  03/24/22 : 130/79  03/02/22 : 174/81  01/17/22 : 178/87  01/13/22 : 188/87  12/01/21 : (!) 168/92      ESTIMATED GLOMERULAR FILT RATE (ML/MIN)   Date Value   03/24/2022 > 60   03/02/2022 > 60   09/13/2021 > 60     Assessment/ Recommendation:     1. Blood Pressure:   a. Last reported BP was above goal (<130/14mHg). Next due: Next visit   b. Interventions: START amlodipine '5mg'$  daily. Continue chlorthalidone '25mg'$  daily and losartan '100mg'$  daily.  1. BMP WNL on 03/24/22.  ii. Past trials: irbesartan, lisinopril (cough), HCTZ (replaced with chlorthalidone)  c. SMBP: Continue measuring at least 3x/week.  d. Will re-assess at follow up.    Planned return date: 2 week TV..Marland KitchenEncouraged patient to keep scheduled pharmacotherapy visits and to call site pharmacist with questions.

## 2022-05-02 ENCOUNTER — Ambulatory Visit: Payer: No Typology Code available for payment source | Attending: Internal Medicine | Admitting: Pharmacist

## 2022-05-02 DIAGNOSIS — I10 Essential (primary) hypertension: Secondary | ICD-10-CM | POA: Insufficient documentation

## 2022-05-02 MED ORDER — AMLODIPINE BESYLATE 5 MG PO TABS
5.0000 mg | ORAL_TABLET | Freq: Every day | ORAL | 1 refills | Status: DC
Start: 2022-05-02 — End: 2022-09-29

## 2022-05-02 MED FILL — AMLODIPINE 5MG: 30 days supply | Qty: 30 | Fill #0

## 2022-05-10 NOTE — Progress Notes (Signed)
HTN Management Telephone Follow-up Visit     Subjective:    Translator used for this visit: Yes, [place].    Mariah [name] is a 66 year old female. CONDITION(S) ADDRESSED AT THIS PHARMACOTHERAPY VISIT: List of Conditions: Hypertension/Elevated BP    Reports (+) adherence to losartan '100mg'$  daily and chlorthalidone '25mg'$  daily. Reports she has not yet started amlodipine '5mg'$  daily as instructed last visit due to traveling.  .   Dizziness/lightheadedness? No.     Took BP meds today: Yes, takes in AM.  Self monitoring blood pressure: Yes, automatic wrist cuff (validated 02/2022) that broke so recently bought an arm cuff. Recalls following: 140/68mHg    SH: lives with husband, not working. [place]     - Smoking status: denies  - Alcohol use: denies    Diet: usually eats at home. Using garlic, salt. Denies deli meat, canned soups. Mostly uses fresh vegetables and fruits.    Exercise: still walking daily    Objective:   Most Recent BP Reading(s)  03/24/22 : 130/79  03/02/22 : 174/81  01/17/22 : 178/87  01/13/22 : 188/87  12/01/21 : (!) 168/92      ESTIMATED GLOMERULAR FILT RATE (ML/MIN)   Date Value   03/24/2022 > 60   03/02/2022 > 60   09/13/2021 > 60     Assessment/ Recommendation:     1. Blood Pressure:   a. Last reported BP was above goal (<130/835mg), likely due to not starting amlodipine yet. Next due: Next visit   b. Interventions: START amlodipine '5mg'$  daily as planned. Continue chlorthalidone '25mg'$  daily and losartan '100mg'$  daily (ordered '100mg'$  strength tablets today to reduce pill burden).  1. BMP WNL on 03/24/22.  ii. Past trials: irbesartan, lisinopril (cough), HCTZ (replaced with chlorthalidone)  c. SMBP: Continue measuring at least 3x/week. Schedule machine validation visit next time.  d. Will re-assess at follow up.    Planned return date: 2 week TV. Schedule BP machine validation next visit. Encouraged patient to keep scheduled pharmacotherapy visits and to call site pharmacist with  questions.

## 2022-05-16 ENCOUNTER — Ambulatory Visit: Payer: No Typology Code available for payment source | Attending: Internal Medicine | Admitting: Pharmacist

## 2022-05-16 DIAGNOSIS — I10 Essential (primary) hypertension: Secondary | ICD-10-CM | POA: Diagnosis present

## 2022-05-16 DIAGNOSIS — I119 Hypertensive heart disease without heart failure: Secondary | ICD-10-CM | POA: Insufficient documentation

## 2022-05-16 MED ORDER — LOSARTAN POTASSIUM 100 MG PO TABS
100.0000 mg | ORAL_TABLET | Freq: Every day | ORAL | 1 refills | Status: DC
Start: 2022-05-16 — End: 2022-09-29

## 2022-05-16 MED FILL — LOSARTAN POT 100MG: 90 days supply | Qty: 90 | Fill #0

## 2022-05-25 NOTE — Progress Notes (Deleted)
HTN Management Telephone Follow-up Visit     Subjective:    Translator used for this visit: Yes, [place].    Mariah [name] is a 66 year old female. CONDITION(S) ADDRESSED AT THIS PHARMACOTHERAPY VISIT: List of Conditions: Hypertension/Elevated BP    Reports (+) adherence to losartan '100mg'$  daily, chlorthalidone '25mg'$  daily and amlodipine '5mg'$  daily (started last visit)  .   Dizziness/lightheadedness? No.     Took BP meds today: Yes, takes in AM.  Self monitoring blood pressure: Yes, has an arm cuff (not validated yet). Recalls following: ***    SH: lives with husband, not working. Moving back to [place] next year     Diet: usually eats at home. Using garlic, salt. Denies deli meat, canned soups. Mostly uses fresh vegetables and fruits.    Exercise: still walking daily    Objective:   Most Recent BP Reading(s)  03/24/22 : 130/79  03/02/22 : 174/81  01/17/22 : 178/87  01/13/22 : 188/87  12/01/21 : (!) 168/92      ESTIMATED GLOMERULAR FILT RATE (ML/MIN)   Date Value   03/24/2022 > 60   03/02/2022 > 60   09/13/2021 > 60     Assessment/ Recommendation:     1. Blood Pressure:   a. Last reported BP was above goal (<130/25mHg). Next due: Next visit   b. Interventions: Increase amlodipine '10mg'$  daily. Continue chlorthalidone '25mg'$  daily and losartan '100mg'$  daily.  1. BMP WNL on 03/24/22.  ii. Past trials: irbesartan, lisinopril (cough), HCTZ (replaced with chlorthalidone)  c. SMBP: Continue measuring at least 3x/week. Due for machine validation.  d. Will re-assess at follow up.    Planned return date: *** clinic visit for BMP machine validation. Encouraged patient to keep scheduled pharmacotherapy visits and to call site pharmacist with questions.

## 2022-05-30 ENCOUNTER — Telehealth (HOSPITAL_BASED_OUTPATIENT_CLINIC_OR_DEPARTMENT_OTHER): Payer: Self-pay | Admitting: Pharmacist

## 2022-05-30 ENCOUNTER — Ambulatory Visit (HOSPITAL_BASED_OUTPATIENT_CLINIC_OR_DEPARTMENT_OTHER): Payer: No Typology Code available for payment source | Admitting: Pharmacist

## 2022-05-30 DIAGNOSIS — I10 Essential (primary) hypertension: Secondary | ICD-10-CM

## 2022-05-30 NOTE — Telephone Encounter (Signed)
Hypertension/Elevated BP and Outreach    Date of missed visit: 05/30/22    Attempt: 1st    For televisit    Outcome:   Left message to schedule via clinic    Should pt return my call:  Please schedule with [facility] pharmacotherapy scheduling subgroup, televisit 30 min

## 2022-06-15 MED FILL — CHLORTHALIDONE 25MG: 30 days supply | Qty: 30 | Fill #1

## 2022-06-15 MED FILL — LOSARTAN POT 100MG: 90 days supply | Qty: 90 | Fill #0

## 2022-06-15 MED FILL — AMLODIPINE 5MG: 30 days supply | Qty: 30 | Fill #0

## 2022-06-15 MED FILL — FAMOTIDINE 40MG: 30 days supply | Qty: 60 | Fill #1

## 2022-07-14 ENCOUNTER — Other Ambulatory Visit: Payer: Self-pay

## 2022-07-14 ENCOUNTER — Telehealth (HOSPITAL_BASED_OUTPATIENT_CLINIC_OR_DEPARTMENT_OTHER): Payer: Self-pay

## 2022-07-14 NOTE — Telephone Encounter (Signed)
Called pt to confirm her upcoming appt with me. Left message with info and call back number.

## 2022-07-17 ENCOUNTER — Other Ambulatory Visit: Payer: Self-pay

## 2022-07-17 ENCOUNTER — Ambulatory Visit: Payer: No Typology Code available for payment source | Attending: Psychiatry

## 2022-07-17 DIAGNOSIS — F321 Major depressive disorder, single episode, moderate: Secondary | ICD-10-CM | POA: Insufficient documentation

## 2022-07-17 NOTE — Progress Notes (Signed)
NEUROPSYCHOLOGY/PSYCHOLOGY TESTING PROGRESS NOTE       VISIT TYPE: adult neuropsychological testing     INTERPRETER: Nell in [place]: no      REASON FOR TESTING REFERRAL: cognitive problem    SOURCE(S) OF INFORMATION: patient     SUBJECTIVE FINDINGS:   CHIEF COMPLAINT AND CLINICAL UPDATES IN PATIENT'S WORDS    1. Chief Complaint (Patient and guardian's own words, concerns, and expressed thoughts): Pt reported memory and word-finding difficulties and concern for Alzheimer's diease.       2) History of presenting problem (describe patient's cognitive and emotional symptoms, context, course, and impact on instrumental activities of daily living): Pt noted that she is very forgetful and has difficulty memorizing things. She stated that she forgets recent events and conversations. She also noted word-finding difficulties a few times a week. Pt endorsed attention difficulties. She also endorsed a lifelong history of untreated anxiety and depression, but has a desire to obtain tx at this point.      OBJECTIVE FINDINGS:   DATA REVIEWED (Consider medical labs, radiology, other medical tests; screening/outcome measures; neuropsychological and psychological testing; discussion of test results with other clinicians; consultation with other clinicians and systems involved with patient, summary of old records) Epic chart     CURRENT MEDICATIONS:  Current Outpatient Medications   Medication Sig   . losartan (COZAAR) 100 MG tablet Take 1 tablet by mouth in the morning.   Marland Kitchen amLODIPine (NORVASC) 5 MG tablet Take 1 tablet by mouth in the morning.   . Vitamin D, Ergocalciferol, 1.25 MG (50000 UT) capsule Take 1 capsule by mouth once a week  for 12 doses   . chlorthalidone (HYGROTEN) 25 MG tablet Take 1 tablet by mouth in the morning. (STOP HCTZ).   Marland Kitchen acetaminophen (TYLENOL) 500 MG tablet Take 1,000 mg by mouth every 6 (six) hours as needed for Pain   . cholecalciferol (VITAMIN D3) 1000 UNIT tablet Take 1 tablet by  mouth in the morning.     No current facility-administered medications for this visit.        MEDICATION ADHERENCE (including barriers and how addressed): yes     INFORMED CONSENT (for any new assessment): N/A      MENTAL STATUS EXAMINATION:   Mental Status Exam - 07/17/22 1213        Mental Status Exam     General Appearance Clean     Behavior Cooperative     Level of Consciousness Alert     Orientation Level Grossly Intact     Attention/Concentration Consistent difficulty paying attention     Mannerisms/Movements No abnormal mannerisms/movements     Speech Quality and Rate WNL     Speech Clarity Clear     Speech Tone Normal vocal inflection     Memory Intact     Thought Process & Associations Goal-directed     Dissociative Symptoms None     Thought Content No abnormalities reported or observed     Hallucinations None     Suicidal Thoughts None     Homicidal Thoughts None     Mood Depressed/Sad;Anxious     Affect Congruent with mood     Judgment Good      Insight Good                       [place] RISK ASSESSMENTS:   Suicide:  CSSR OP LAST CONTACT    Flowsheet Row [facility] Office Visit  from 07/17/2022 in [facility] - Psychiatry   Have you wished you were dead or wished you could go to sleep and not wake up? No   Have you actually had any thoughts of killing yourself?  No   Have you done anything, started to do anything, or prepared to do anything to end your life? No   C-SSRS OP Last Contact Screener Risk Score No Risk         C-SSRS Risk Assessment    No documentation.                  VIOLENCE:    Violence/Abuse Risk    No documentation.                  Protective Factors:   Protective Factors    No documentation.                   RISK ASSESSMENTS:   Violence: low (1)   Addiction: low (1)                       SUBSTANCE USE:   Substance Use - 07/17/22 1213        Alcohol Screening    How often did you have a drink containing alcohol in the past year? 0     Alcohol - Total Score 0         Substance Use Screen    * Used chemicals (other than as prescribed) in the past 12 months? N                  ASSESSMENT   CLINICAL FORMULATION (please refer to NEUROPSYCHOLOGICAL/PSYCHOLOGICAL TESTING REPORT for details of testing formulation; initial or additional impressions added here): Saw pt for neuropsych testing. She was cooperative with all procedures. Memory for personal information appeared intact. She exhibited some difficulty attending to tasks and needed frequent reminders. Mood was notable for significant anxiety and depression, and she denied SI. There is consideration for mood impacting her functioning on a daily basis.      REVIEWING TODAY'S VISIT  CLINICAL INTERVENTIONS TODAY: interview and testing    PATIENT'S RESPONSE TO INTERVENTIONS: Pt was agreeable to al intake and testing procedures.     MEDICAL NECESSITY FOR TODAY'S VISIT: contribute necessary clinical information for differential diagnosis     DIAGNOSES:    Current moderate episode of major depressive disorder, unspecified whether recurrent ([place])   SNOMED CT(R): MODERATE MAJOR DEPRESSION, SINGLE EPISODE       PLAN: Pt will return for feedback     RECOMMENDATIONS: See full report for details     INTERVIEW CONDUCTED BY: Sandi Mariscal, PsyD     TESTING CONDUCTED BY: Sandi Mariscal, PsyD      ACTUAL TOTAL TIME SPENT WITH PATIENT TODAY:   Professional Services (interview, report-writing, feedback) by Clinician : 0  Administration and Scoring time by Clinician: 0  Administration and Scoring time by Technician:180 mins     SUPERVISED BY: Elon Spanner, PhD

## 2022-07-27 ENCOUNTER — Telehealth (HOSPITAL_BASED_OUTPATIENT_CLINIC_OR_DEPARTMENT_OTHER): Payer: Self-pay

## 2022-07-27 NOTE — Telephone Encounter (Signed)
Telephone call to pt. Pt. Reached. Appt. Scheduled.

## 2022-07-27 NOTE — Telephone Encounter (Signed)
Pls call to offer televisit to follow up her neurospysch evaluation.   At pt's convenience and availability

## 2022-08-02 ENCOUNTER — Encounter (HOSPITAL_BASED_OUTPATIENT_CLINIC_OR_DEPARTMENT_OTHER): Payer: Self-pay

## 2022-08-02 ENCOUNTER — Ambulatory Visit: Payer: Non-veteran care

## 2022-08-02 DIAGNOSIS — F331 Major depressive disorder, recurrent, moderate: Secondary | ICD-10-CM

## 2022-08-02 DIAGNOSIS — F411 Generalized anxiety disorder: Secondary | ICD-10-CM
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
## 2022-08-02 NOTE — Progress Notes (Signed)
NEUROPSYCHOLOGY/PSYCHOLOGY TESTING PROGRESS NOTE       VISIT TYPE: feedback session     INTERPRETER: telephone      OBSERVER: no      REASON FOR TESTING REFERRAL: cognitive problem    SOURCE(S) OF INFORMATION: patient     SUBJECTIVE FINDINGS:   CHIEF COMPLAINT AND CLINICAL UPDATES IN PATIENT'S WORDS    Chief Complaint (Patient and guardian's own words, concerns, and expressed thoughts): Pt reported memory and word-finding difficulties and concern for Alzheimer's diease.       2) History of presenting problem (describe patient's cognitive and emotional symptoms, context, course, and impact on instrumental activities of daily living): Pt noted that she is very forgetful and has difficulty memorizing things. She stated that she forgets recent events and conversations. She also noted word-finding difficulties a few times a week. Pt endorsed attention difficulties. She also endorsed a lifelong history of untreated anxiety and depression, but has a desire to obtain tx at this point.      OBJECTIVE FINDINGS:   DATA REVIEWED (Consider medical labs, radiology, other medical tests; screening/outcome measures; neuropsychological and psychological testing; discussion of test results with other clinicians; consultation with other clinicians and systems involved with patient, summary of old records) Epic chart     CURRENT MEDICATIONS:  Current Outpatient Medications   Medication Sig    losartan (COZAAR) 100 MG tablet Take 1 tablet by mouth in the morning.    amLODIPine (NORVASC) 5 MG tablet Take 1 tablet by mouth in the morning.    Vitamin D, Ergocalciferol, 1.25 MG (50000 UT) capsule Take 1 capsule by mouth once a week  for 12 doses    chlorthalidone (HYGROTEN) 25 MG tablet Take 1 tablet by mouth in the morning. (STOP HCTZ).    acetaminophen (TYLENOL) 500 MG tablet Take 1,000 mg by mouth every 6 (six) hours as needed for Pain    cholecalciferol (VITAMIN D3) 1000 UNIT tablet Take 1 tablet by mouth in the morning.     No current  facility-administered medications for this visit.        MEDICATION ADHERENCE (including barriers and how addressed): yes     INFORMED CONSENT (for any new assessment): N/A      MENTAL STATUS EXAMINATION:   Mental Status Exam - 08/02/22 1343          Mental Status Exam     General Appearance Not able to observe     Behavior Cooperative     Level of Consciousness Not able to observe     Orientation Level UTA     Attention/Concentration Not able to observe     Mannerisms/Movements Not able to observe     Speech Quality and Rate WNL     Speech Clarity Clear     Speech Tone Normal vocal inflection     Vocabulary/Fund of Knowledge WNL     Memory Intact     Thought Process & Associations Goal-directed     Dissociative Symptoms None     Thought Content No abnormalities reported or observed     Hallucinations None     Suicidal Thoughts None     Homicidal Thoughts None     Mood Euthymic     Affect Congruent with mood     Judgment Good      Insight Good                           [place] RISK ASSESSMENTS:  Suicide:  CSSR OP LAST CONTACT      Flowsheet Row Shiloh from 08/02/2022 in [facility] - Psychiatry   Have you wished you were dead or wished you could go to sleep and not wake up? No   Have you actually had any thoughts of killing yourself?  No   Have you done anything, started to do anything, or prepared to do anything to end your life? No   C-SSRS OP Last Contact Screener Risk Score No Risk           C-SSRS Risk Assessment    No documentation.                    VIOLENCE:    Violence/Abuse Risk    No documentation.                    Protective Factors:   Protective Factors    No documentation.                     RISK ASSESSMENTS:     Violence: low (1)     Addiction: low (1)                       SUBSTANCE USE:   Substance Use - 08/02/22 1343          Alcohol Screening    How often did you have a drink containing alcohol in the past year? 0     Alcohol - Total Score 0         Substance Use Screen    * Used chemicals (other than as prescribed) in the past 12 months? N                      ASSESSMENT   CLINICAL FORMULATION (please refer to NEUROPSYCHOLOGICAL/PSYCHOLOGICAL TESTING REPORT for details of testing formulation; initial or additional impressions added here): Had feedback session with pt. The evaluation showed fluctuating engagement, though several areas of cognitive functioning were intact. The results are limited due to fluctuating engagement but her difficulties in daily life are likely due to significant psychiatric sxs, psychosocial stressors, and poor sleep. Pt expressed understanding. Provided pt with recommendations (see full report for details).     REVIEWING TODAY'S VISIT  CLINICAL INTERVENTIONS TODAY: evaluation feedback    PATIENT'S RESPONSE TO INTERVENTIONS: Pt was agreeable to feedback    MEDICAL NECESSITY FOR TODAY'S VISIT: contribute necessary clinical information for differential diagnosis     DIAGNOSES:    Major depressive disorder, recurrent episode, moderate (HCC)   SNOMED CT(R): RECURRENT MAJOR DEPRESSIVE EPISODES, MODERATE     Generalized anxiety disorder   SNOMED CT(R): GENERALIZED ANXIETY DISORDER       PLAN: See full report for details     RECOMMENDATIONS: See full report for details     INTERVIEW CONDUCTED BY: Sandi Mariscal, PsyD     TESTING CONDUCTED BY: Sandi Mariscal, PsyD      ACTUAL TOTAL TIME SPENT WITH PATIENT TODAY:   Professional Services (interview, report-writing, feedback) by Clinician : 0  Administration and Scoring time by Clinician: 0  Administration and Scoring time by Technician:0     SUPERVISED BY: Elon Spanner, PhD

## 2022-08-09 ENCOUNTER — Telehealth (HOSPITAL_BASED_OUTPATIENT_CLINIC_OR_DEPARTMENT_OTHER): Payer: Self-pay | Admitting: Internal Medicine

## 2022-08-09 ENCOUNTER — Ambulatory Visit: Payer: No Typology Code available for payment source | Attending: Internal Medicine | Admitting: Internal Medicine

## 2022-08-09 DIAGNOSIS — Z9189 Other specified personal risk factors, not elsewhere classified: Secondary | ICD-10-CM | POA: Diagnosis present

## 2022-08-09 DIAGNOSIS — R5383 Other fatigue: Secondary | ICD-10-CM

## 2022-08-09 DIAGNOSIS — R0602 Shortness of breath: Secondary | ICD-10-CM

## 2022-08-09 DIAGNOSIS — R413 Other amnesia: Secondary | ICD-10-CM | POA: Insufficient documentation

## 2022-08-09 DIAGNOSIS — F32A Depression, unspecified: Secondary | ICD-10-CM | POA: Diagnosis present

## 2022-08-09 DIAGNOSIS — F5101 Primary insomnia: Secondary | ICD-10-CM | POA: Insufficient documentation

## 2022-08-09 MED ORDER — ZOLPIDEM TARTRATE 5 MG PO TABS
5.0000 mg | ORAL_TABLET | Freq: Every evening | ORAL | 0 refills | Status: DC | PRN
Start: 2022-08-09 — End: 2022-09-29

## 2022-08-09 MED ORDER — BUPROPION HCL ER (SR) 150 MG PO TB12
150.0000 mg | ORAL_TABLET | Freq: Two times a day (BID) | ORAL | 2 refills | Status: DC
Start: 2022-08-09 — End: 2022-09-29

## 2022-08-09 MED FILL — BUPROPION 150MG SR: 30 days supply | Qty: 60 | Fill #0

## 2022-08-09 MED FILL — ZOLPIDEM   5MG: 30 days supply | Qty: 30 | Fill #0

## 2022-08-09 NOTE — Telephone Encounter (Signed)
A user error has taken place: encounter opened in error, closed for administrative reasons.

## 2022-08-09 NOTE — Progress Notes (Signed)
Mariah [name] is a 66 year old femaleo, Prt spkr I last saw in April 2023    Interim hx  Neuropsych eval, no significant cognitive changes,   sych sx, psych stressors, poor sleep. Recommended FU with PCP for treatment of depression.     Chart review,  Med hx, on escitalopram 2021-22  Fluoxetine 2005-2008  Citalopram 2006-2007 & 2019-2020    Pt did not connect to video. Audio call      #) "I'm so-so"  Mood has been up and down  Sometimes feels very tired, "like my chest is heavy"  Physically tired with minimal effort  Gets out of breath after going up one flight of stairs  Needs to stop after walking one block    Sometimes mild chest pain, but not sure if she's really feeling it  + rapid heart beat    Ongoing "for a long time", she always forgets to talk about this    Mother died of heart problems in her early 19's. She's concerned about her heart    #) Lack of motivation-   Started driving granddaughter to and from school since last week  Having a schedule this week has made her feel better    Does not feel she is able to return to house cleaning d/t lack of energy above  Feels socially withdrawn  From March until last week, she has not done much. Mainly stayed at home  Lack of motivation to go out of walks or to see friends  Aware of physical deconditioning, but also concerned about her overall lack of motivation "this is not normal, to just stay at home, sitting all day"  "I love life" - denies SI/HI  But has no energy/motivation to do anything      #) Sleep is very irregular. Difficult to fall asleep  Light sleep, wakes up several times at night, then it is difficult to fall asleep again  It has been like this for many years  Husb says she snores. He has not mentioned that she wakes up gasping for air.     Melatonin, takes up to 10 mg, does not help  Takes tizanidine, but it makes her cough a lot    OBJECTIVE:  VS and PE deferred d/t telehealth visit  Pt speaking in full sentences in  NAD    ASSESSMENT & PLAN:  (F51.01) Primary insomnia  (primary encounter diagnosis)  (R53.83) Lack of energy  (Z91.89) Lack of motivation  (F32.A) Mild depression  Comment: Based on Neuropsych eval, pt's hx, likely all these sx are interconnected. Recent neuropsych eval did not show significant cognitive changes, but rather a concern for untreated depression and insomnia. We reviewed in detail how these issues are interconnected.   Pt has poor insight. Agrees to:  -- referral to sleep specialist to further explore insomnia  -- short course (1 month) zolpidem to improve sleep  --start wellbutrin 150 mg BID to treat underlying depression  -- continue regular schedule, establish daily tasks  -- when mood improves, incorporate regular physical activity to daily routine: aim for 20  min walks on a daily basis  -- She had several labs to check for metabolic and micronutrient deficiencies in March/23, with normal results, and pt had presented with similar sx at the time. On her insistence, will recheck thryoid, vit D and ferritin. Future lab orders  Plan: zolpidem (AMBIEN) 5 MG tablet, REFERRAL TO         SLEEP SPECIALIST ([facility])  VITAMIN D,25 HYDROXY, FERRITIN, THYROID SCREEN         TSH REFLEX FT4   buPROPion (WELLBUTRIN SR) 150 MG 12 hr tablet        (R06.02) Shortness of breath  Comment: Noticed on exertion, walking or going up stairs. Admits increased sedentary,live style, as above.   Ddx, deconditioning, cardiac, pulmonary  -- echo stress to assess cardiac function  -- could also do PFTs --  not addressed at this visit  Plan: CARDIAC STRESS TEST: ECHO (IMAGING)          (R41.3) Memory changes  Comment: Neuropsych testing in June/23. No significant cognitive decline. Sx were thought to be related to psych stressors, poor sleep.  Plan: As above    The patient was ready to learn and no apparent learning or adherence barriers were identified. I explained the diagnosis and treatment plan, and the patient expressed  understanding of the content. I attempted to answer any questions regarding the diagnosis and the proposed treatment.    Possible side effects of the prescribed medication was explained. We discussed the patient's current medications.  We discussed the importance of medication compliance. The patient expressed understanding and no barriers to adherence were identified.    follow-up   -- lab only visit - pt will call clinic to schedule  -- pt will call to schedule echo stress and pulmonary eval  -- has FU in person appt with me in October, when we can review results and effects of meds discussed today  she has been advised to call or return with any worsening or new problems    I spent a total of 39 minutes on this visit on the date of service (total time includes all activities performed on the date of service)

## 2022-08-28 ENCOUNTER — Ambulatory Visit
Admission: RE | Admit: 2022-08-28 | Discharge: 2022-08-28 | Disposition: A | Discharge status: Home / Self Care | Payer: No Typology Code available for payment source | Attending: Internal Medicine | Admitting: Internal Medicine

## 2022-08-28 ENCOUNTER — Other Ambulatory Visit: Payer: Self-pay

## 2022-08-28 DIAGNOSIS — R5383 Other fatigue: Secondary | ICD-10-CM | POA: Diagnosis present

## 2022-08-28 LAB — THYROID SCREEN TSH REFLEX FT4: THYROID SCREEN TSH REFLEX FT4: 2.38 u[IU]/mL (ref 0.270–4.200)

## 2022-08-28 LAB — FERRITIN: FERRITIN: 43 ng/mL (ref 13–150)

## 2022-08-28 LAB — VITAMIN D,25 HYDROXY: VITAMIN D,25 HYDROXY: 24 ng/mL — ABNORMAL LOW (ref 30.0–100.0)

## 2022-08-29 ENCOUNTER — Encounter (HOSPITAL_BASED_OUTPATIENT_CLINIC_OR_DEPARTMENT_OTHER): Payer: Self-pay | Admitting: Internal Medicine

## 2022-08-29 DIAGNOSIS — E559 Vitamin D deficiency, unspecified: Secondary | ICD-10-CM

## 2022-08-29 MED ORDER — VITAMIN D (ERGOCALCIFEROL) 1.25 MG (50000 UT) PO CAPS
1.0000 | ORAL_CAPSULE | ORAL | 0 refills | Status: DC
Start: 2022-08-29 — End: 2023-06-11

## 2022-08-29 MED FILL — VITAMIN D 50000UNT: 84 days supply | Qty: 12 | Fill #0

## 2022-09-27 ENCOUNTER — Other Ambulatory Visit (HOSPITAL_BASED_OUTPATIENT_CLINIC_OR_DEPARTMENT_OTHER): Payer: Self-pay | Admitting: Internal Medicine

## 2022-09-27 DIAGNOSIS — F5101 Primary insomnia: Secondary | ICD-10-CM

## 2022-09-27 NOTE — Telephone Encounter (Signed)
PER Pharmacy, Mariah [name] is a 66 year old female has requested a refill of zolpidem     Last prescribed - start date: 8/23 end date: 9/22        Last Office Visit: 08/09/2022  Last Physical Exam: 12/27/2018      Other Med Adult:  Most Recent BP Reading(s)  03/24/22 : 130/79        Cholesterol (mg/dL)   Date Value   02/11/2020 190     LOW DENSITY LIPOPROTEIN DIRECT (mg/dL)   Date Value   02/11/2020 116     HIGH DENSITY LIPOPROTEIN (mg/dL)   Date Value   02/11/2020 56     TRIGLYCERIDES (mg/dL)   Date Value   02/11/2020 128         THYROID SCREEN TSH REFLEX FT4 (uIU/mL)   Date Value   08/28/2022 2.380         TSH (THYROID STIM HORMONE) (uIU/mL)   Date Value   04/03/2012 1.68       HEMOGLOBIN A1C (%)   Date Value   05/09/2021 5.8 (H)       No results found for: POCA1C      INR (no units)   Date Value   04/28/2014 < 1.0   01/18/2007 1.0 (L)       SODIUM (mmol/L)   Date Value   03/24/2022 142       POTASSIUM (mmol/L)   Date Value   03/24/2022 4.3           CREATININE (mg/dL)   Date Value   03/24/2022 0.8       Documented patient preferred pharmacies:    [facility], [place] - [address]  Phone: [phone] Fax: [phone]

## 2022-09-29 ENCOUNTER — Ambulatory Visit: Payer: No Typology Code available for payment source | Attending: Internal Medicine | Admitting: Internal Medicine

## 2022-09-29 ENCOUNTER — Other Ambulatory Visit: Payer: Self-pay

## 2022-09-29 VITALS — BP 144/86 | HR 72 | Temp 96.5°F | Ht 64.0 in | Wt 204.0 lb

## 2022-09-29 DIAGNOSIS — G8929 Other chronic pain: Secondary | ICD-10-CM

## 2022-09-29 DIAGNOSIS — R918 Other nonspecific abnormal finding of lung field: Secondary | ICD-10-CM | POA: Diagnosis present

## 2022-09-29 DIAGNOSIS — I119 Hypertensive heart disease without heart failure: Secondary | ICD-10-CM | POA: Diagnosis present

## 2022-09-29 DIAGNOSIS — R5383 Other fatigue: Secondary | ICD-10-CM | POA: Diagnosis present

## 2022-09-29 DIAGNOSIS — M25562 Pain in left knee: Secondary | ICD-10-CM | POA: Insufficient documentation

## 2022-09-29 DIAGNOSIS — M25561 Pain in right knee: Secondary | ICD-10-CM | POA: Diagnosis present

## 2022-09-29 DIAGNOSIS — Z9181 History of falling: Secondary | ICD-10-CM | POA: Diagnosis present

## 2022-09-29 DIAGNOSIS — F5101 Primary insomnia: Secondary | ICD-10-CM | POA: Insufficient documentation

## 2022-09-29 DIAGNOSIS — Z9189 Other specified personal risk factors, not elsewhere classified: Secondary | ICD-10-CM

## 2022-09-29 DIAGNOSIS — E559 Vitamin D deficiency, unspecified: Secondary | ICD-10-CM | POA: Diagnosis present

## 2022-09-29 DIAGNOSIS — Z87891 Personal history of nicotine dependence: Secondary | ICD-10-CM | POA: Diagnosis present

## 2022-09-29 DIAGNOSIS — F32A Depression, unspecified: Secondary | ICD-10-CM | POA: Diagnosis present

## 2022-09-29 DIAGNOSIS — Z7185 Encounter for immunization safety counseling: Secondary | ICD-10-CM | POA: Diagnosis present

## 2022-09-29 MED ORDER — CHLORTHALIDONE 25 MG PO TABS
25.0000 mg | ORAL_TABLET | Freq: Every day | ORAL | 5 refills | Status: DC
Start: 2022-09-29 — End: 2023-02-22

## 2022-09-29 MED ORDER — CHOLECALCIFEROL 50 MCG (2000 UT) PO TABS
2000.0000 [IU] | ORAL_TABLET | Freq: Every day | ORAL | 11 refills | Status: DC
Start: 2022-09-29 — End: 2023-10-02

## 2022-09-29 MED ORDER — BUPROPION HCL ER (SR) 150 MG PO TB12
150.0000 mg | ORAL_TABLET | Freq: Two times a day (BID) | ORAL | 2 refills | Status: DC
Start: 2022-09-29 — End: 2023-02-22

## 2022-09-29 MED ORDER — AMLODIPINE BESYLATE 5 MG PO TABS
5.0000 mg | ORAL_TABLET | Freq: Every day | ORAL | 1 refills | Status: DC
Start: 2022-09-29 — End: 2023-02-22

## 2022-09-29 MED ORDER — LOSARTAN POTASSIUM 100 MG PO TABS
100.0000 mg | ORAL_TABLET | Freq: Every day | ORAL | 1 refills | Status: DC
Start: 2022-09-29 — End: 2023-02-22

## 2022-09-29 MED ORDER — ZOLPIDEM TARTRATE 5 MG PO TABS
5.0000 mg | ORAL_TABLET | Freq: Every evening | ORAL | 0 refills | Status: DC | PRN
Start: 2022-09-29 — End: 2022-12-21

## 2022-09-29 NOTE — Progress Notes (Unsigned)
Mariah [name] is a 66 year old female, [place], for routine check in    #) Mood  "Feeling great"  Sleeping well with zolpidem, wakes up refreshed, has good energy during the day  Helping with granddaughter, and stays super busy during the day with things, here and there.   Not sure what helped her feel so well (compared to last visit in August): if improved sleep, if starting bupropion to treat underlying depression, or a combination of both.   Does not like going to the gym  Considering aquatic aerobic classes  Husb drinking less, it makes her happy, "he's an alcoholic"    #)   Chronic pain on knees, at baseline  Difficulty walking d/t knee pain/OA, cannot walk more than one block before stopping because of pain  When going grocery shopping, needs to park very close to entrance. During grocery shopping, leans on the cart to relieve the pain  She saw orthopedist a couple of times, had cs inj, but it was very painful. Does not even remember if it was helpful. Does not want to return    Willing to do PT    Per chart review, last ortho eval for knee pain in 2016  Last knee Xray July 2019 post a fall  Comparison: 03/09/2015     Findings:      Mild-to-moderate narrowing medial compartment patellofemoral joint space  Subchondral cystic changes noted at tibial spine,  along  lateral aspect of lateral tibial plateau.  Spurs arise from lateral aspect of lateral femoral condyle and medial aspect of medial tibial plateau  Increase in size of spurs superior ventral and dorsal surface of patella.  Small suprapatellar joint effusion is seen.  No acute fracture or dislocation is seen.     IMPRESSION:     Mild increase osteoarthritic changes as described.  Small suprapatellar joint effusion    #) Sometimes experiences SOB, happens frequently  No clear triggers  Discussed in previous visits, no changes  Has echocardiogram scheduled in November  And pulmonary eval 10/27    #) Review of symptoms:   No fevers or  unexplained weight loss. No visual changes. No sore throat or ear ache.  No prolonged cough. No dyspnea or chest pain on exertion, occasional SOB as above.  No abdominal pain or change in bowel habits.  No vaginal discharge or dysuria.  No new or unusual musculoskeletal symptoms, chronic knee pain as above. No new rashes or skin changes.  No pain or masses in breasts. No paresthesias or unusual headaches. No sadness or anxiety that interferes with day-to-day activities. No hot flashes. No enlarged nodes.  No new itching, sneezing or wheezing.    OBJECTIVE:  BP 144/86 (Site: LA, Position: Sitting, Cuff Size: Lrg)   Pulse 72   Temp 96.5 F (35.8 C) (Temporal)   Ht '5\' 4"'$  (1.626 m)   Wt 92.5 kg (204 lb)   SpO2 98%   BMI 35.02 kg/m   Pleasant, in NAD  Calm, smiling, cheerful  Heart: S1 and S2 normal, no murmurs, clicks, gallops or rubs. Regular rate and rhythm.   Lungs:  clear; no wheezes, rhonchi or rales.    FALL RISK ASSESSMENT  ILA LANDOWSKI was asked the following Fall Risk Assessment questions on 09/29/2022 during her visit at [facility]:  Do you use any of the following assist devices: N/A  Have you fallen down recently? No  If yes, where did the fall(s) occur? N/A  Which  hand do you consider your dominant hand? right       ASSESSMENT & PLAN:  (R53.83) Lack of energy  (primary encounter diagnosis)  (F32.A) Mild depression  (Z91.89) Lack of motivation  Comment: Mood has significantly improved since last visit. She cannot pinpoint a specific cause, likely a combination of taking bupropion consistently, improved sleep, getting into a new routine, and husb decreased drinking  Pt encouraged to continue this  Pt encouraged to start a physical activity that she enjoys doing, and commit to do it consistently x 3 months to start and create the habit.   Needs refill  Plan: buPROPion (WELLBUTRIN SR) 150 MG 12 hr tablet          (F51.01) Primary insomnia  Comment: Quality of sleep improved "100%" on  zolpidem, and mood and level of energy improved.   Discussed continuation for now, but not a long term goal  As mood continues to improve, consider start tapering down of zolpidem, take every other day, and slowly wean off. We reviewed that these medications can be habit forming, and the goal is for  her to establish better sleep hygiene.   Plan: zolpidem (AMBIEN) 5 MG tablet          (E55.9) Hypovitaminosis D  Comment: On weekly supplementation x 4 more weeks. She's going to the pharmacy tomorrow to pick up other refills. Once she completes the mega supplementation, should continue with daily supplementation  Plan: cholecalciferol (VITAMIN D3) 2000 UNIT TABS         tablet          (I11.9) Benign hypertensive heart disease without heart failure  Comment: BP borderline today. Reports taking meds consistently Asymptomatic.   Needs refill for all meds  Plan: chlorthalidone (HYGROTEN) 25 MG tablet,         amLODIPine (NORVASC) 5 MG tablet, losartan         (COZAAR) 100 MG tablet          (R91.8) Abnormal CT lung screening  (Z87.891) Former smoker  Comment: Routine screening FU  Plan: CT LUNG SCREENING (ANNUAL), CANCELED: CT LUNG         SCREENING FOLLOW UP            (M25.561,  M25.562,  G89.29) Chronic pain of both knees  (Z91.81) At moderate risk for fall  Comment: Pain has limited function, cannot walk more than one block and needs to stop d/t pain. No swelling. Not interested in returning to ortho (last visit 2016) for cs inj. Willing to do PT to strengthen overall posture and legs. Then, also interested in starting aquaerobics  Requests handicap placard paperwork  Not time to address today. She will drop it off, with her portion of the application completed  Plan: REFERRAL TO PHYSICAL THERAPY ([facility])          (Z71.85) Immunization counseling  Comment: Discussed zoster #2, but not available in clinic d/t age. Encouraged to ask about it at the pharmacy. Agrees to influenza and covid today  Will leave PCV for another  visit  Plan: INFLUENZA VACCINE HIGH DOSE FOR 65 AND OLDER,         0.5ML, IM, MODERNA >12YO COVID-19 FALL 2023          The patient was ready to learn and no apparent learning or adherence barriers were identified. I explained the diagnosis and treatment plan, and the patient expressed understanding of the content. I attempted to answer any questions regarding the diagnosis  and the proposed treatment.    Possible side effects of the prescribed medication was explained. We discussed the patient's current medications.  We discussed the importance of medication compliance. The patient expressed understanding and no barriers to adherence were identified.    follow-up prn  she has been advised to call or return with any worsening or new problems    I spent a total of 38 minutes on this visit on the date of service (total time includes all activities performed on the date of service)

## 2022-10-11 ENCOUNTER — Telehealth (HOSPITAL_BASED_OUTPATIENT_CLINIC_OR_DEPARTMENT_OTHER): Payer: Self-pay

## 2022-10-11 ENCOUNTER — Encounter (HOSPITAL_BASED_OUTPATIENT_CLINIC_OR_DEPARTMENT_OTHER): Payer: Self-pay

## 2022-10-11 NOTE — Telephone Encounter (Signed)
Hypertension/Elevated BP and Outreach    Date of missed visit: 05/30/22    Attempt: 2nd and Population Health Outreach    For in person visit     Outcome:   Left message to schedule via clinic and Letter sent     Should pt return my call:  Please schedule with [facility] pharmacotherapy scheduling subgroup, rx consult 30

## 2022-10-13 ENCOUNTER — Ambulatory Visit (HOSPITAL_BASED_OUTPATIENT_CLINIC_OR_DEPARTMENT_OTHER): Payer: No Typology Code available for payment source | Admitting: Critical Care Medicine

## 2022-10-30 ENCOUNTER — Ambulatory Visit
Admission: RE | Admit: 2022-10-30 | Discharge: 2022-10-30 | Disposition: A | Discharge status: Home / Self Care | Payer: No Typology Code available for payment source | Attending: Radiology | Admitting: Radiology

## 2022-10-30 ENCOUNTER — Ambulatory Visit (HOSPITAL_BASED_OUTPATIENT_CLINIC_OR_DEPARTMENT_OTHER): Payer: No Typology Code available for payment source

## 2022-10-30 ENCOUNTER — Other Ambulatory Visit: Payer: Self-pay

## 2022-10-30 DIAGNOSIS — Z87891 Personal history of nicotine dependence: Secondary | ICD-10-CM | POA: Diagnosis present

## 2022-10-30 DIAGNOSIS — R918 Other nonspecific abnormal finding of lung field: Secondary | ICD-10-CM | POA: Diagnosis present

## 2022-10-30 DIAGNOSIS — Z122 Encounter for screening for malignant neoplasm of respiratory organs: Secondary | ICD-10-CM | POA: Diagnosis present

## 2022-11-07 ENCOUNTER — Inpatient Hospital Stay (HOSPITAL_BASED_OUTPATIENT_CLINIC_OR_DEPARTMENT_OTHER): Admission: RE | Admit: 2022-11-07 | Payer: No Typology Code available for payment source | Source: Ambulatory Visit

## 2022-12-20 ENCOUNTER — Other Ambulatory Visit (HOSPITAL_BASED_OUTPATIENT_CLINIC_OR_DEPARTMENT_OTHER): Payer: Self-pay | Admitting: Internal Medicine

## 2022-12-20 DIAGNOSIS — F5101 Primary insomnia: Secondary | ICD-10-CM

## 2022-12-20 NOTE — Telephone Encounter (Signed)
PER Pharmacy,NAME@ is a 67 year old female has requested a refill of     zolpidem (AMBIEN) 5 MG tablet      Last prescribed - start date:09/29/22  end date: 10/29/22     Last Office Visit: 09/29/22  Last Physical Exam: 12/27/18  There are no preventive care reminders to display for this patient.     Other Med Adult:  Most Recent BP Reading(s)  09/29/22 : 144/86        Cholesterol (mg/dL)   Date Value   02/11/2020 190     LOW DENSITY LIPOPROTEIN DIRECT (mg/dL)   Date Value   02/11/2020 116     HIGH DENSITY LIPOPROTEIN (mg/dL)   Date Value   02/11/2020 56     TRIGLYCERIDES (mg/dL)   Date Value   02/11/2020 128         THYROID SCREEN TSH REFLEX FT4 (uIU/mL)   Date Value   08/28/2022 2.380         TSH (THYROID STIM HORMONE) (uIU/mL)   Date Value   04/03/2012 1.68       HEMOGLOBIN A1C (%)   Date Value   05/09/2021 5.8 (H)       No results found for: "POCA1C"      INR (no units)   Date Value   04/28/2014 < 1.0   01/18/2007 1.0 (L)       SODIUM (mmol/L)   Date Value   03/24/2022 142       POTASSIUM (mmol/L)   Date Value   03/24/2022 4.3           CREATININE (mg/dL)   Date Value   03/24/2022 0.8        Documented patient preferred pharmacies:    [facility], [place] - [address]  Phone: [phone] Fax: [phone]

## 2022-12-26 ENCOUNTER — Ambulatory Visit: Payer: No Typology Code available for payment source | Attending: Ophthalmology | Admitting: Ophthalmology

## 2022-12-26 ENCOUNTER — Other Ambulatory Visit: Payer: Self-pay

## 2022-12-26 ENCOUNTER — Encounter (HOSPITAL_BASED_OUTPATIENT_CLINIC_OR_DEPARTMENT_OTHER): Payer: Self-pay | Admitting: Ophthalmology

## 2022-12-26 DIAGNOSIS — H524 Presbyopia: Secondary | ICD-10-CM | POA: Insufficient documentation

## 2022-12-26 DIAGNOSIS — H353121 Nonexudative age-related macular degeneration, left eye, early dry stage: Secondary | ICD-10-CM | POA: Diagnosis not present

## 2022-12-26 DIAGNOSIS — H5203 Hypermetropia, bilateral: Secondary | ICD-10-CM | POA: Diagnosis present

## 2022-12-26 NOTE — Progress Notes (Signed)
Impression:  Mild macular mottle OS  Glaucoma suspect:  Mildly asymmetric optic nerve cupping.  No signs of neuropathy.  Normal corneal thickness, normal IOP.  Immature cataract:  Interested in cataract surgery.  Risks and benefits of the surgery discussed and cataracts do not appear visually significant at this time.  Hyperopia/Presybopia    Plan:  Rx given for spectacle lenses  Observe  See 1 year

## 2022-12-26 NOTE — Progress Notes (Signed)
Mariah [name] is a 67 year old adult  Here for annual eye exam,H/O Glaucoma suspect,Dermatochalasis, Hyperopia, Presbyopia.                C/O blurry vision OU.  Itching OU.  No pain.  No light flashes or floaters.        Ocular med  None

## 2023-01-09 ENCOUNTER — Other Ambulatory Visit (HOSPITAL_BASED_OUTPATIENT_CLINIC_OR_DEPARTMENT_OTHER): Payer: Self-pay | Admitting: Internal Medicine

## 2023-01-09 NOTE — Telephone Encounter (Signed)
PER Pharmacy, Mariah [name] is a 67 year old female has requested a refill of      -  Famotidine       Last Office Visit: 09/29/22 with Jyl Heinz  Last Physical Exam: 12/27/18     There are no preventive care reminders to display for this patient.     Other Med Adult:  Most Recent BP Reading(s)  09/29/22 : 144/86        Cholesterol (mg/dL)   Date Value   02/11/2020 190     LOW DENSITY LIPOPROTEIN DIRECT (mg/dL)   Date Value   02/11/2020 116     HIGH DENSITY LIPOPROTEIN (mg/dL)   Date Value   02/11/2020 56     TRIGLYCERIDES (mg/dL)   Date Value   02/11/2020 128         THYROID SCREEN TSH REFLEX FT4 (uIU/mL)   Date Value   08/28/2022 2.380         TSH (THYROID STIM HORMONE) (uIU/mL)   Date Value   04/03/2012 1.68       HEMOGLOBIN A1C (%)   Date Value   05/09/2021 5.8 (H)       No results found for: "POCA1C"      INR (no units)   Date Value   04/28/2014 < 1.0   01/18/2007 1.0 (L)       SODIUM (mmol/L)   Date Value   03/24/2022 142       POTASSIUM (mmol/L)   Date Value   03/24/2022 4.3           CREATININE (mg/dL)   Date Value   03/24/2022 0.8        Documented patient preferred pharmacies:    [facility], [place] - [place]. [address]  Phone: [phone] Fax: [phone]

## 2023-02-12 ENCOUNTER — Ambulatory Visit (HOSPITAL_BASED_OUTPATIENT_CLINIC_OR_DEPARTMENT_OTHER): Payer: Self-pay

## 2023-02-12 NOTE — Telephone Encounter (Signed)
Reason for Disposition   Fatigue is a chronic symptom (recurrent or ongoing AND present > 4 weeks)    Answer Assessment - Initial Assessment Questions  Pt requesting ultrasound of her heart due to feeling tired and feeling chest pain at times and pain and numbness in arms for a long time now.  She does have a stress echo ordered and would like to schedule that.  Offered to give her contact information to schedule this and she prefers to have a follow up with her PCP.    Protocols used: Weakness (Generalized) and Fatigue-A-OH    MyChart sign up sent - email confirmed.    Advised per nursing triage protocol.  Verbalized understanding and agreement with instructions and disposition.     Recommended disposition for patient:Disposition: See in Office within 2 weeks    If patient referred to UC/ED advised that they may require further follow up and testing after the visit with their primary care office.     Instructed patient to call back for any new, worsening, or worrisome symptoms or concerns any time day or night.

## 2023-02-15 ENCOUNTER — Emergency Department (HOSPITAL_BASED_OUTPATIENT_CLINIC_OR_DEPARTMENT_OTHER): Payer: No Typology Code available for payment source

## 2023-02-15 ENCOUNTER — Other Ambulatory Visit: Payer: Self-pay

## 2023-02-15 ENCOUNTER — Emergency Department
Admission: EM | Admit: 2023-02-15 | Discharge: 2023-02-15 | Disposition: A | Discharge status: Home / Self Care | Payer: No Typology Code available for payment source | Attending: Student in an Organized Health Care Education/Training Program | Admitting: Student in an Organized Health Care Education/Training Program

## 2023-02-15 DIAGNOSIS — R079 Chest pain, unspecified: Secondary | ICD-10-CM

## 2023-02-15 DIAGNOSIS — R0789 Other chest pain: Secondary | ICD-10-CM

## 2023-02-15 DIAGNOSIS — R0602 Shortness of breath: Secondary | ICD-10-CM | POA: Diagnosis not present

## 2023-02-15 DIAGNOSIS — R200 Anesthesia of skin: Secondary | ICD-10-CM | POA: Diagnosis not present

## 2023-02-15 LAB — COMPREHENSIVE METABOLIC PANEL
ALANINE AMINOTRANSFERASE: 20 U/L (ref 12–45)
ALBUMIN: 4.6 g/dL (ref 3.4–5.2)
ALKALINE PHOSPHATASE: 157 U/L — ABNORMAL HIGH (ref 45–117)
ANION GAP: 10 mmol/L (ref 10–22)
ASPARTATE AMINOTRANSFERASE: 19 U/L (ref 8–34)
BILIRUBIN TOTAL: 0.3 mg/dL (ref 0.2–1.0)
BUN (UREA NITROGEN): 16 mg/dL (ref 7–18)
CALCIUM: 10.2 mg/dL (ref 8.5–10.5)
CARBON DIOXIDE: 28 mmol/L (ref 21–32)
CHLORIDE: 104 mmol/L (ref 98–107)
CREATININE: 0.9 mg/dL (ref 0.4–1.2)
ESTIMATED GLOMERULAR FILT RATE: >60 mL/min (ref >60)
Glucose Random: 57 mg/dL — ABNORMAL LOW (ref 74–160)
POTASSIUM: 3.8 mmol/L (ref 3.5–5.1)
SODIUM: 142 mmol/L (ref 136–145)
TOTAL PROTEIN: 7.6 g/dL (ref 6.4–8.2)

## 2023-02-15 LAB — CBC, PLATELET & DIFFERENTIAL
ABSOLUTE BASO COUNT: 0.1 10*3/uL (ref 0.0–0.1)
ABSOLUTE EOSINOPHIL COUNT: 0.1 10*3/uL (ref 0.0–0.8)
ABSOLUTE IMM GRAN COUNT: 0.02 10*3/uL (ref 0.00–0.10)
ABSOLUTE LYMPH COUNT: 1.8 10*3/uL (ref 0.6–5.9)
ABSOLUTE MONO COUNT: 0.5 10*3/uL (ref 0.2–1.4)
ABSOLUTE NEUTROPHIL COUNT: 3.6 10*3/uL (ref 1.6–8.3)
ABSOLUTE NRBC COUNT: 0 10*3/uL (ref 0.0–0.0)
BASOPHIL %: 1 % (ref 0.0–1.2)
EOSINOPHIL %: 2.1 % (ref 0.0–7.0)
HEMATOCRIT: 38.8 % (ref 34.1–44.9)
HEMOGLOBIN: 12.2 g/dL (ref 11.2–15.7)
IMMATURE GRANULOCYTE %: 0.3 % (ref 0.0–1.0)
LYMPHOCYTE %: 29.3 % (ref 15.0–54.0)
MEAN CORP HGB CONC: 31.4 g/dL (ref 31.0–37.0)
MEAN CORPUSCULAR HGB: 26 pg (ref 26.0–34.0)
MEAN CORPUSCULAR VOL: 82.7 fl (ref 80.0–100.0)
MEAN PLATELET VOLUME: 9.8 fL (ref 8.7–12.5)
MONOCYTE %: 8.1 % (ref 4.0–13.0)
NEUTROPHIL %: 59.2 % (ref 40.0–75.0)
NRBC %: 0 % (ref 0.0–0.0)
PLATELET COUNT: 339 10*3/uL (ref 150–400)
RBC DISTRIBUTION WIDTH STD DEV: 42.3 fL (ref 35.1–46.3)
RED BLOOD CELL COUNT: 4.69 M/uL (ref 3.90–5.20)
WHITE BLOOD CELL COUNT: 6.1 10*3/uL (ref 4.0–11.0)

## 2023-02-15 LAB — TROPONIN T HS BASELINE: TROPONIN T HS BASELINE: 9 ng/L (ref 0–10)

## 2023-02-15 MED ORDER — LIDOCAINE VISCOUS HCL 2 % MT SOLN
5.00 mL | Freq: Once | OROMUCOSAL | Status: AC
Start: 2023-02-15 — End: 2023-02-15
  Administered 2023-02-15: 5 mL via OROMUCOSAL
  Filled 2023-02-15: qty 15

## 2023-02-15 MED ORDER — FAMOTIDINE (PF) 20 MG/2ML IV SOLN
20.00 mg | Freq: Once | INTRAVENOUS | Status: AC
Start: 2023-02-15 — End: 2023-02-15
  Administered 2023-02-15: 20 mg via INTRAVENOUS
  Filled 2023-02-15: qty 2

## 2023-02-15 MED ORDER — ALUMINUM & MAGNESIUM HYDROXIDE 200-200 MG/5ML PO SUSP
30.00 mL | Freq: Once | ORAL | Status: AC
Start: 2023-02-15 — End: 2023-02-15
  Administered 2023-02-15: 30 mL via ORAL
  Filled 2023-02-15: qty 30

## 2023-02-15 MED ORDER — LORAZEPAM 0.5 MG PO TABS
0.5000 mg | ORAL_TABLET | Freq: Once | ORAL | Status: AC
Start: 2023-02-15 — End: 2023-02-15
  Administered 2023-02-15: 0.5 mg via ORAL
  Filled 2023-02-15: qty 1

## 2023-02-15 NOTE — ED Provider Notes (Signed)
EMERGENCY DEPARTMENT RESIDENT NOTE    The ED nursing record was reviewed.   The prior medical records as available electronically through Epic were reviewed.  Patient was evaluated via [place] interpreter.  This patient was seen with Emergency Department attending physician Dr. Verner Chol who evaluated the patient independently and agrees with assessment and plan    CHIEF COMPLAINT    Patient presents with:  Chest Pain  Breathing Problem      HPI    Mariah [name] is a 67 year old female PMH HTN and HLD here for chest pain and shortness of breath and numbness in her fingertips bilaterally.  States that for the last few days, she has been having episodes of chest pain with shortness of breath.  These have all been associated with numbness in all of her fingertips.  States that this pain and shortness of breath is not associated with exertion and shortness of breath is not pleuritic.  She was making lunch today and had a more intense episode of chest pain with shortness of breath.  She states that this was also associated with some nausea.  No LOC.  No radiation of pain to the back.  No vomiting, no leg swelling, no orthopnea.      REVIEW OF SYSTEMS    The pertinent positives are reviewed in the HPI above. All other systems were reviewed and are negative.    PAST MEDICAL HISTORY    Past Medical History:  03/27/2006: ABN FIND-STOOL CONTENTS-OCC BLOOD      Comment:  s/p colonoscopy and EGD 7/07  06/04/2006: ABSENCE OF MENSTRUATION - s/p TAH BSO  08/05/2004: ANXIETY DEPRESSION      Comment:  TSH wnl 8/02; trial of fluoxetine 8/05  08/05/2004: BACK PAIN LOW      Comment:  LBP musculoskeletal most likely 1/04  04/23/2006: Cough      Comment:  s/p PFTs September 2007 --  some obstruction at mid to                low lung volumes not correctable by bronchodilator CT                scan showed stable pulmonary nodules over several years                11/07 - cough gone ** note combined with dx of "nodules                 on CT scan". Will file this note to hx.     08/05/2004: depressive disorder      Comment:  TSH wnl 8/02; trial of fluoxetine x 6 wks didn't really                help 8/05  trial of citalopram per Dr. Patrice Paradise. later                resolved. 04/15/2014 - Reassessed, pt reports feeling                well, with no sx of depression. Just reports being tired,               which she attributes to current life style. Will resolve                from problem list.    03/27/2006: Diarrhea      Comment:  7/07 The colonoscopy was visually normal and biopsies  taken to rule out microscopic colitis were negative.  A                biopsy of the descending duodenum to rule out celiac                sprue because of the diarrhea was negative.    12/19/2012: Gastritis      Comment:  Combined with entry "esophageal reflux    10/22/2006: Hyperparathyroidism      Comment:  osteoporosis found - pt referred to surgery for the                hyperparathyroidism- s/p parathyroidectomy feb 2008  06/18/2012: Inconclusive mammogram      Comment:  June 2013 - BIRADS 3. Probable benign mammogram.                Recommend 6 month  follow-up mammogram (Dec 2013).                 Dec/13 - Stable left breast nodule with no evidence of                malignancy. BIRADS 2 benign recommend annual screening                mammogram in 6 months  (June 2014) June/14 - mammogram                request given to pt, she will call to schedule:  Jan/15 -               normal mammogram, yearly screening. Resolve from problem                list.        03/13/2006: Insomnia, unspecified      Comment:  06/25/2012 - intermittent, usually resolved by taking                Tylenol PM 06/09/2014 - good response to trazodone, takes                one to 4x wk, prn 08/24/2016 - reports no problems sleeping               - file to hx, and resolve from problem list.   03/27/2006: nodules on chest CT 1/06 and 3/07      Comment:  s/p PFTs September 2007 --  some  obstruction at mid to                low lung volumes not correctable by bronchodilator CT                scan showed stable pulmonary nodules over several years                11/07 - cough gone  04/29/12 - saw pneumologist, Dr Jordan Hawks,               "Will repeat chest CT with high-resolution scans at the 2               month mark (already ordered). If abnormal, would do PFT's               and further rheum w/u, consider lung bx . Pt to call if                any r  08/05/2004: OBESITY NOS      Comment:  has tried meds from [place]  08/05/2004: PERS  HX OF PAST NONCOMPLIANCE      Comment:  poor compliance to med regimen  08/05/2004: PERS HX TOBACCO USE      Comment:  quit 10/02; 1/2 PPD since 67 YO  08/05/2004: Screening for hypertension      Comment:  occ elevated BP started on anti-HTN in [place]; chem 7,                EKG, HCT u/a wnl 4/03; d/c'ed HCTZ 8/05 when we learned                from daughter she has not been taking it  05/19/2008: Vitamin D deficiency      Comment:  Supplement per dr Jake Samples.    PROBLEM LIST  Patient Active Problem List:     History of bariatric surgery     LATERAL FOOT PAIN     Benign hypertensive heart disease without heart failure     Alkaline phosphatase elevation     Calculus of kidney     Gastritis     Allergic rhinitis due to other allergen     Osteoporosis     Murmur heart     Family history of throat cancer     Vitamin D deficiency     Subclavian artery stenosis, left (HCC)     Schatzki's ring     Duodenal ulcer, unspecified as acute or chronic, without hemorrhage, perforation, or obstruction     Mild depression     Glaucoma suspect of both eyes     Hyperopia with presbyopia of both eyes     Abnormal CT lung screening     Elevated LFTs     COVID-19 virus infection     SBO (small bowel obstruction) ([place])     Validated blood pressure cuff for home monitoring       SURGICAL HISTORY    Past Surgical History:  04/2016: BARIATRIC SURGERY      Comment:  done at [facility]  2006:  PARATHYROIDECTOMY      Comment:  partial,   5/00: SALPINGO-OOPHORECTOMY COMPL/PRTL UNI/BI SPX      Comment:  Salpingo-Oophorectomy, bilateral  No date: TONSILLECTOMY ONE-HALF <AGE 38  5/00: TOTAL ABDOMINAL HYSTERECT W/WO RMVL TUBE OVARY      Comment:  Hysterectomy, Total Abdominal secondary to fibroids    CURRENT MEDICATIONS    No current facility-administered medications for this encounter.    Current Outpatient Medications:     famotidine (PEPCID) 40 MG tablet, TAKE 1 TABLET BY MOUTH IN THE MORNING AND 1 TABLET BEFORE BEDTIME., Disp: 60 tablet, Rfl: 2    chlorthalidone (HYGROTEN) 25 MG tablet, Take 1 tablet by mouth in the morning. (STOP HCTZ)., Disp: 30 tablet, Rfl: 5    amLODIPine (NORVASC) 5 MG tablet, Take 1 tablet by mouth in the morning., Disp: 30 tablet, Rfl: 1    losartan (COZAAR) 100 MG tablet, Take 1 tablet by mouth in the morning., Disp: 90 tablet, Rfl: 1    buPROPion (WELLBUTRIN SR) 150 MG 12 hr tablet, Take 1 tablet by mouth in the morning and 1 tablet before bedtime., Disp: 60 tablet, Rfl: 2    cholecalciferol (VITAMIN D3) 2000 UNIT TABS tablet, Take 1 tablet by mouth in the morning., Disp: 30 tablet, Rfl: 11    Vitamin D, Ergocalciferol, 1.25 MG (50000 UT) capsule, Take 1 capsule by mouth once a week  for 12 doses, Disp: 12 capsule, Rfl: 0    acetaminophen (TYLENOL) 500 MG tablet,  Take 1,000 mg by mouth every 6 (six) hours as needed for Pain, Disp: , Rfl:     ALLERGIES    Review of Patient's Allergies indicates:   Lisinopril              Cough   Heparin                 Rash    Comment:local rash seen during hospitalization Feb 2008             Received lovenox during hospitalization Oct 2021             w/o issue    FAMILY HISTORY    Review of patient's family history indicates:  Problem: No Known Problems      Relation: Father          Age of Onset: (Not Specified)  Problem: Cancer - Lung      Relation: Father          Age of Onset: (Not Specified)          Comment: died from lung CA- throat cancer  per pt. Also on dyalisis               for 10 years  Problem: Hypertension      Relation: Mother          Age of Onset: (Not Specified)  Problem: Lipids      Relation: Mother          Age of Onset: (Not Specified)          Comment: hyperlipidemia  Problem: Heart      Relation: Maternal Uncle          Age of Onset: (Not Specified)          Comment: died 62 from MI  Problem: Diabetes      Relation: Maternal Aunt          Age of Onset: (Not Specified)  Problem: Diabetes      Relation: Mother          Age of Onset: (Not Specified)          Comment: also morbidly weight  Problem: Cancer - Other      Relation: Brother          Age of Onset: (Not Specified)          Comment: liver cancer, Sept 2013  Problem: Non-contributory      Relation: Brother          Age of Onset: (Not Specified)          Comment: no first deg rel. w/ breast ca  Problem: No Known Problems      Relation: Sister          Age of Onset: (Not Specified)  Problem: No Known Problems      Relation: Daughter          Age of Onset: (Not Specified)  Problem: No Known Problems      Relation: Maternal Grandmother          Age of Onset: (Not Specified)  Problem: No Known Problems      Relation: Maternal Grandfather          Age of Onset: (Not Specified)  Problem: No Known Problems      Relation: Paternal Grandmother          Age of Onset: (Not Specified)  Problem: No Known Problems      Relation: Paternal Grandfather  Age of Onset: (Not Specified)      SOCIAL HISTORY    Social History     Socioeconomic History    Marital status: Married     Spouse name: Not on file    Number of children: Not on file    Years of education: Not on file    Highest education level: Not on file   Occupational History    Not on file   Tobacco Use    Smoking status: Former     Packs/day: 0.50     Years: 40.00     Additional pack years: 0.00     Total pack years: 20.00     Types: Cigarettes     Quit date: 03/31/2012     Years since quitting: 10.8    Smokeless tobacco: Former     Tobacco comments:     3-4 cigarretes a day. Quit about 1 month ago   Substance and Sexual Activity    Alcohol use: Yes     Comment: very occasional    Drug use: No    Sexual activity: Yes     Partners: Male     Comment: histerectomy   Other Topics Concern    Not on file   Social History Narrative    work: Engineer, building services; owned bakery in [place]    Lives w/ daughter 68 YO (Grazielle Hagerman) & son-in-law Lowella Dandy)    Divorced; currently not in a relationship        05/27/2015    From [place]. Works as Electrical engineer, has own business.     Married, Djaniro.     No regular exercise    Diet: varies. Eats at irregular intervals. Tries to watch diet, but likes to eat    Stressors: work    Pleasures: family, grand children        08/24/2016    Same living and work situation. No regular exercise. Diet improved after bariatric surgery, and pt is very happy with results.         01/09/2019    No changes in social hx   Social Determinants of Health  Financial Resource Strain: Not on file  Food Insecurity: Not on file  Transportation Needs: Not on file  Physical Activity: Not on file  Stress: Not on file  Social Connections: Not on file  Intimate Partner Violence: Not on file  Housing Stability: Not on file      PHYSICAL EXAM      Vital Signs: BP 133/84   Pulse 79   Temp 97.2 F   Resp 20   Wt 93 kg (205 lb)   SpO2 98%   BMI 35.19 kg/m      Constitutional: Well-nourished, well-developed, Non-toxic appearing, anxious appearing  Head: Normocephalic, atraumatic  Neck: Supple, full range of motion  Eyes: PERRL, EOMI  ENT: Airway is patent  Cardio.: RRR, No LE edema  Pulmonary: Lungs clear to auscultation bilaterally. No wheezes, rales or rhonchi;  Non-labored breathing   Abd. Soft, non-tender, non-distended.   GU:  No CVA tenderness.  Musculoskeletal:  Moving all 4 extremities, No major deformities noted.   Neurological: AOx3 with clear speech, follows commands.  Ambulatory with steady gait  Skin: Warm and dry, no  rashes  Psychiatric: Appropriate for age and situation.      RESULTS  No results found for this visit on 02/15/23 (from the past 24 hour(s)).       MEDICATIONS ADMINISTERED ON THIS VISIT  No orders of  the defined types were placed in this encounter.      ED COURSE & MEDICAL DECISION MAKING      I reviewed the patient's past medical history/problem list, past surgical history, medication list, social history and allergies.    ED Decision Making & Course: Mariah [name] is a 67 year old female PMH HTN and HLD here for chest pain and shortness of breath and numbness in her fingertips bilaterally.  Regarding chest pain, reassured against ACS given pain is not exertional.  Heart score places patient at low risk as well.  Pain is associated with some shortness of breath that is not pleuritic and patient has a low Wells score, reassuring against PE.  Patient also does not have radiation of pain to the back and given that the onset of this pain was over 3 days ago, reassured against aortic dissection.  Patient also has blood pressures relatively equal bilaterally.  First EKG not showing any ST segment changes and showing normal sinus rhythm.  Patient is anxious appearing and has episodes of chest pain when IV is being placed.  It is possible that the chest pain is related to some of her anxious symptoms.  Regarding the numbness she is feeling in her hands, she has sensation to touch across all nerve distributions in the hand.  The numbness does not follow a particular nerve distribution, which is reassuring against radiculopathy.  Numbness is also bilateral, reassuring against acute stroke or TIA.  It is possible that the numbness is in the setting her of her family anxious about her chest pain and shortness of breath.  We will obtain chest x-ray, troponin, and provide patient with medications for her pain.    Chest x-ray reassuring with no acute cardiopulmonary findings.  First troponin negative at 9.  Given patient has  been having this pain for more than 3 hours, this is reassuring against further need to trend troponins at this time.  EKG also showing normal sinus rhythm without any ST segment changes.    Patient is feeling improved after medication for anxiety and for pain.  Results discussed with patient who states understanding and agreement at this time.  Patient states that she will follow-up with her primary care physician.    Indications for return discussed in detail.  Patient understands and agrees to plan. Discharged in stable condition.    Follow Up:   Patient instructed to follow-up with PCP.    Clinical Impression:  Pharyngitis    Disposition:   Home    Patient Condition:  Stable      Katina Degree, MD PGY-1  Emergency Medicine

## 2023-02-15 NOTE — ED Triage Note (Signed)
Pt self presents to ED with family with c/o chest pain, shortness of breath and "numbness in my finger tips".     Pt ambulatory to triage. Complains of a "few days" of left-sided chest pain and shortness of breath. "I had a severe cold and couldn't get in with  my doctor". Pt slightly tachypneic, appears anxious.

## 2023-02-15 NOTE — Discharge Instructions (Addendum)
You were seen in the emergency department for pain in your chest and tingling in your fingers.  We performed a physical exam which was reassuring.  While you were here, we also obtained labs which were also reassuring.  We tested labs that looked at your heart and they were reassuring against heart attack.  We also obtained an EKG that looks at the electricity in your heart and that looked reassuring as well.  We also obtained a chest x-ray which did not show any emergent problem.  We gave you some medication to help with your symptoms and you stated that this improved your symptoms.  At this time, we believe you are safe for discharge.  Please follow-up with your primary care physician by calling them tomorrow to schedule follow-up appointment.    Please return to the emergency department for any worsening chest pain, shortness of breath, any numbness or tingling that worsens in your extremities, any loss of consciousness or feeling like you are going to pass out, or any new or worsening or concerning symptoms to you.

## 2023-02-15 NOTE — Narrator Note (Signed)
Patient Disposition  Patient education for diagnosis, medications, activity, diet and follow-up.  Patient left ED 1:38 PM.  Patient rep received written instructions.    Interpreter to provide instructions: No ( declined)    Patient belongings with patient: YES    Have all existing LDAs been addressed? Yes    Have all IV infusions been stopped? N/A    Destination: Discharged to home. Reviewed d/c instructions including follow up care. Pt verbalized understanding. Pt left ED via ambulating w steady gait. Vital signs WDL.

## 2023-02-18 LAB — EKG

## 2023-02-20 ENCOUNTER — Ambulatory Visit (HOSPITAL_BASED_OUTPATIENT_CLINIC_OR_DEPARTMENT_OTHER): Payer: No Typology Code available for payment source | Admitting: Ophthalmology

## 2023-02-22 ENCOUNTER — Other Ambulatory Visit: Payer: Self-pay

## 2023-02-22 ENCOUNTER — Ambulatory Visit: Payer: No Typology Code available for payment source | Attending: Internal Medicine | Admitting: Internal Medicine

## 2023-02-22 VITALS — BP 144/85 | HR 82 | Wt 207.0 lb

## 2023-02-22 DIAGNOSIS — I119 Hypertensive heart disease without heart failure: Secondary | ICD-10-CM | POA: Insufficient documentation

## 2023-02-22 DIAGNOSIS — R1013 Epigastric pain: Secondary | ICD-10-CM | POA: Insufficient documentation

## 2023-02-22 DIAGNOSIS — R0789 Other chest pain: Secondary | ICD-10-CM | POA: Insufficient documentation

## 2023-02-22 DIAGNOSIS — F321 Major depressive disorder, single episode, moderate: Secondary | ICD-10-CM | POA: Insufficient documentation

## 2023-02-22 DIAGNOSIS — R918 Other nonspecific abnormal finding of lung field: Secondary | ICD-10-CM | POA: Insufficient documentation

## 2023-02-22 MED ORDER — LOSARTAN POTASSIUM 100 MG PO TABS
100.0000 mg | ORAL_TABLET | Freq: Every day | ORAL | 1 refills | Status: DC
Start: 2023-02-22 — End: 2023-10-02

## 2023-02-22 MED ORDER — CHLORTHALIDONE 25 MG PO TABS
25.0000 mg | ORAL_TABLET | Freq: Every day | ORAL | 5 refills | Status: DC
Start: 2023-02-22 — End: 2023-10-02

## 2023-02-22 MED ORDER — FAMOTIDINE 40 MG PO TABS
ORAL_TABLET | ORAL | 5 refills | Status: AC
Start: 2023-02-22 — End: 2023-08-24

## 2023-02-22 MED ORDER — AMLODIPINE BESYLATE 5 MG PO TABS
5.0000 mg | ORAL_TABLET | Freq: Every day | ORAL | 1 refills | Status: DC
Start: 2023-02-22 — End: 2023-03-14

## 2023-02-22 NOTE — Progress Notes (Signed)
Mariah [name] is a 67 year old female, [place]    ED visit 2/29 with chest pain, no abnormal findings  When she went to the ED -- she had a burning sensation in her chest, and SOB.    She's had these episodes for several months. We discussed them last summer/fall. A stress echo was ordered, pt did not have it done for unclear reasons    She still has it intermittently, at rest or exertion  She's able to walk at least 4 blocks without stopping  She's able to go up two flights of stairs without stopping    Very anxious, difficulty falling asleep, this has been "for most of my life"  She stopped taking bupropion because she did not feel as anxious  Does not remember when was the last time she took it  Not feeling depressed  Just concerned today, because dtr is admitted at [facility] been off all of her BP meds for a month or so  Most Recent BP Reading(s)  02/22/23 : 144/85  02/15/23 : 149/79  09/29/22 : 144/86  03/24/22 : 130/79  03/02/22 : 174/81    OBJECTIVE:  BP 144/85 (Site: LA, Position: Sitting, Cuff Size: Reg)   Pulse 82   Wt 93.9 kg (207 lb)   SpO2 98%   BMI 35.53 kg/m   Pleasant, in NAD  Appears tired  HEENT: normocephalic.  Eyes with no lesions or discharge, EOM intact. Ears with TM pearly gray, light cone reflex, canals clear bl. Nose patent, with scant clear rhinorrhea. Oropharynx mucosa pink and moist, teeth in good repair, uvula midline, tonsils 1+ bl with no exudates.  Neck: supple, full ROM, no lymphadenopathy, no palpable masses on thyroid  Heart: S1 and S2 normal, no murmurs, clicks, gallops or rubs. Regular rate and rhythm.   Lungs:  clear; no wheezes, rhonchi or rales.    ASSESSMENT & PLAN:  (R07.89) Other chest pain  (primary encounter diagnosis)  Comment: Recent ED visit with normal initial workup. She continues with intermittent sx, these have been happening for several months. She missed the stress echo that was scheduled in Nov/23, but she cancelled d/t  being sick that day  Would like to repeat it  Plan: CARDIAC STRESS TEST: ECHO (IMAGING)          (F32.1) Moderate major depression (HCC)  Comment: Disc bupropion several months ago, does not remember when, nor why. But denies feeling depressed as she did. Admits to baseline anxiety and difficulty sleeping. Declines further mgmt today.    (I11.9) Benign hypertensive heart disease without heart failure  Comment: BP well controlled, asymptomatic. Due for refills  Plan: amLODIPine (NORVASC) 5 MG tablet,         chlorthalidone (HYGROTEN) 25 MG tablet,         losartan (COZAAR) 100 MG tablet          (R10.13) Abdominal pain, epigastric  Comment:   Plan: famotidine (PEPCID) 40 MG tablet          The patient was ready to learn and no apparent learning or adherence barriers were identified. I explained the diagnosis and treatment plan, and the patient expressed understanding of the content. I attempted to answer any questions regarding the diagnosis and the proposed treatment.    We discussed the patient's current medications.  We discussed the importance of medication compliance. The patient expressed understanding and no barriers to adherence were identified.    follow-up  prn  she has been advised to call or return with any worsening or new problems

## 2023-03-14 ENCOUNTER — Other Ambulatory Visit (HOSPITAL_BASED_OUTPATIENT_CLINIC_OR_DEPARTMENT_OTHER): Payer: Self-pay | Admitting: Internal Medicine

## 2023-03-14 DIAGNOSIS — I119 Hypertensive heart disease without heart failure: Secondary | ICD-10-CM

## 2023-03-14 MED ORDER — AMLODIPINE BESYLATE 5 MG PO TABS
5.0000 mg | ORAL_TABLET | Freq: Every day | ORAL | 3 refills | Status: DC
Start: 2023-03-14 — End: 2024-10-09

## 2023-03-14 NOTE — Telephone Encounter (Signed)
PER Patient (self), Mariah [name] is a 67 year old female has requested a refill of   Pharmacy is requesting on behalf of patient on a 90 day supply as insurance only covers 90 day please review and respond    -  AMLODIPINE 5 MG       Last Office Visit: 02/22/2023 with PCP  Last Physical Exam: 12/27/2018     There are no preventive care reminders to display for this patient.     Other Med Adult:  Most Recent BP Reading(s)  02/22/23 : 144/85        Cholesterol (mg/dL)   Date Value   02/11/2020 190     LOW DENSITY LIPOPROTEIN DIRECT (mg/dL)   Date Value   02/11/2020 116     HIGH DENSITY LIPOPROTEIN (mg/dL)   Date Value   02/11/2020 56     TRIGLYCERIDES (mg/dL)   Date Value   02/11/2020 128         THYROID SCREEN TSH REFLEX FT4 (uIU/mL)   Date Value   08/28/2022 2.380         TSH (THYROID STIM HORMONE) (uIU/mL)   Date Value   04/03/2012 1.68       HEMOGLOBIN A1C (%)   Date Value   05/09/2021 5.8 (H)       No results found for: "POCA1C"      INR (no units)   Date Value   04/28/2014 < 1.0   01/18/2007 1.0 (L)       SODIUM (mmol/L)   Date Value   02/15/2023 142       POTASSIUM (mmol/L)   Date Value   02/15/2023 3.8           CREATININE (mg/dL)   Date Value   02/15/2023 0.9        Documented patient preferred pharmacies:    [facility], [place] [address]  Phone: [phone] Fax: [phone]

## 2023-06-11 ENCOUNTER — Ambulatory Visit: Payer: No Typology Code available for payment source | Attending: Internal Medicine | Admitting: Internal Medicine

## 2023-06-11 ENCOUNTER — Other Ambulatory Visit: Payer: Self-pay

## 2023-06-11 VITALS — BP 144/87 | HR 67 | Ht 64.0 in | Wt 212.0 lb

## 2023-06-11 DIAGNOSIS — R5382 Chronic fatigue, unspecified: Secondary | ICD-10-CM | POA: Insufficient documentation

## 2023-06-11 DIAGNOSIS — Z72821 Inadequate sleep hygiene: Secondary | ICD-10-CM | POA: Insufficient documentation

## 2023-06-11 DIAGNOSIS — R7303 Prediabetes: Secondary | ICD-10-CM | POA: Diagnosis present

## 2023-06-11 DIAGNOSIS — E559 Vitamin D deficiency, unspecified: Secondary | ICD-10-CM | POA: Diagnosis present

## 2023-06-11 LAB — CBC, PLATELET & DIFFERENTIAL
ABSOLUTE BASO COUNT: 0.1 10*3/uL (ref 0.0–0.1)
ABSOLUTE EOSINOPHIL COUNT: 0.1 10*3/uL (ref 0.0–0.8)
ABSOLUTE IMM GRAN COUNT: 0.02 10*3/uL (ref 0.00–0.10)
ABSOLUTE LYMPH COUNT: 2.1 10*3/uL (ref 0.6–5.9)
ABSOLUTE MONO COUNT: 0.4 10*3/uL (ref 0.2–1.4)
ABSOLUTE NEUTROPHIL COUNT: 3.1 10*3/uL (ref 1.6–8.3)
ABSOLUTE NRBC COUNT: 0 10*3/uL (ref 0.0–0.0)
BASOPHIL %: 1.4 % — ABNORMAL HIGH (ref 0.0–1.2)
EOSINOPHIL %: 2.1 % (ref 0.0–7.0)
HEMATOCRIT: 38.3 % (ref 34.1–44.9)
HEMOGLOBIN: 11.7 g/dL (ref 11.2–15.7)
IMMATURE GRANULOCYTE %: 0.3 % (ref 0.0–1.0)
LYMPHOCYTE %: 36.3 % (ref 15.0–54.0)
MEAN CORP HGB CONC: 30.5 g/dL — ABNORMAL LOW (ref 31.0–37.0)
MEAN CORPUSCULAR HGB: 25.6 pg — ABNORMAL LOW (ref 26.0–34.0)
MEAN CORPUSCULAR VOL: 83.8 fl (ref 80.0–100.0)
MEAN PLATELET VOLUME: 9.9 fL (ref 8.7–12.5)
MONOCYTE %: 7.4 % (ref 4.0–13.0)
NEUTROPHIL %: 52.5 % (ref 40.0–75.0)
NRBC %: 0 % (ref 0.0–0.0)
PLATELET COUNT: 372 10*3/uL (ref 150–400)
RBC DISTRIBUTION WIDTH STD DEV: 45 fL (ref 35.1–46.3)
RED BLOOD CELL COUNT: 4.57 M/uL (ref 3.90–5.20)
WHITE BLOOD CELL COUNT: 5.8 10*3/uL (ref 4.0–11.0)

## 2023-06-11 LAB — COMPREHENSIVE METABOLIC PANEL
ALANINE AMINOTRANSFERASE: 38 U/L (ref 12–45)
ALBUMIN: 4.6 g/dL (ref 3.4–5.2)
ALKALINE PHOSPHATASE: 195 U/L — ABNORMAL HIGH (ref 45–117)
ANION GAP: 13 mmol/L (ref 10–22)
ASPARTATE AMINOTRANSFERASE: 33 U/L (ref 8–34)
BILIRUBIN TOTAL: 0.3 mg/dL (ref 0.2–1.0)
BUN (UREA NITROGEN): 13 mg/dL (ref 7–18)
CALCIUM: 9.8 mg/dL (ref 8.5–10.5)
CARBON DIOXIDE: 25 mmol/L (ref 21–32)
CHLORIDE: 102 mmol/L (ref 98–107)
CREATININE: 0.7 mg/dL (ref 0.4–1.2)
ESTIMATED GLOMERULAR FILT RATE: >60 mL/min (ref >60)
Glucose Random: 85 mg/dL (ref 74–160)
POTASSIUM: 4.4 mmol/L (ref 3.5–5.1)
SODIUM: 140 mmol/L (ref 136–145)
TOTAL PROTEIN: 7.9 g/dL (ref 6.4–8.2)

## 2023-06-11 LAB — THYROID SCREEN TSH REFLEX FT4: THYROID SCREEN TSH REFLEX FT4: 1.5 u[IU]/mL (ref 0.270–4.200)

## 2023-06-11 LAB — VITAMIN B12: VITAMIN B12: 558 pg/mL (ref 232–1245)

## 2023-06-11 LAB — HEMOGLOBIN A1C
ESTIMATED AVERAGE GLUCOSE: 134 mg/dL (ref 74–160)
HEMOGLOBIN A1C: 6.3 % — ABNORMAL HIGH (ref 4.0–5.6)

## 2023-06-11 LAB — VITAMIN D,25 HYDROXY: VITAMIN D,25 HYDROXY: 25 ng/mL — ABNORMAL LOW (ref 30.0–100.0)

## 2023-06-11 MED ORDER — METFORMIN HCL 500 MG PO TABS
500.00 mg | ORAL_TABLET | Freq: Two times a day (BID) | ORAL | 5 refills | Status: AC
Start: 2023-06-11 — End: 2023-12-08

## 2023-06-11 MED ORDER — VITAMIN D (ERGOCALCIFEROL) 1.25 MG (50000 UT) PO CAPS
1.00 | ORAL_CAPSULE | ORAL | 0 refills | Status: AC
Start: 2023-06-11 — End: 2023-09-25

## 2023-06-11 NOTE — Progress Notes (Signed)
Mariah [name] is a 67 year old female [place], I last saw her in March/24, no interim hx  Referred for cardiac stress test, not scheduled    -- Lack of energy, some days cannot even get out of her bed.   Not drinking enough water  She makes juices, but she does not like them, so does not drink them  Difficulty tolerating water  Mouth feels very dry    -- Gaining wt, does not feel that she eats enough to justify it    Most Recent Weight Reading(s)  06/11/23 : 96.2 kg (212 lb)  02/22/23 : 93.9 kg (207 lb)  02/15/23 : 93 kg (205 lb)  09/29/22 : 92.5 kg (204 lb)  03/24/22 : 92.5 kg (204 lb)    Denies any physical pain. Denies chest pain, SOB, DOE.    Not sleeping well, cannot fall asleep. Tosses and turns. Can't stop thinking  Sleeps in the couch. Does not turn the screen on.  Tried OTC cough suppressant PM, not effective  Melatonin 10 mg x4, not effective  Other OTC meds not effective - tends to take in higherdoses  Has not taken anything else for the past 3d    Poor sleep has been chronic, but it's the first time that she feels like she has "zero energy".    -- Does not feel depressed    -- Bought ticket to return to [place], in December, but plans to go in October  Not working any more. She's living in a basement. She does not like this place, they move there to save money in preparation to the return to [place]  She's looking forward to going to [place]  She will go to her dtr's Mariah [name]'s next month, in [place]. She hopes that thiw will help her feel better    OBJECTIVE:  BP 144/87 (Site: LA, Position: Sitting, Cuff Size: Lrg)   Pulse 67   Ht 5\' 4"  (1.626 m)   Wt 96.2 kg (212 lb)   SpO2 94%   BMI 36.39 kg/m   Pleasant, emotional, in NAD. Speaking in full sentence, appropriate affect    ASSESSMENT & PLAN:  (R53.82) Chronic fatigue  (primary encounter diagnosis)  (W10.272) Poor sleep hygiene  Comment: Likely multifactorial. Suspect a component of depression, although she does not agree. She  disc bupropion in March/24 bc she did not feel it was that helpful.  -- r/o metabolic component  -- Sleep hygiene extensively reviewed. Suspect that poor sleep is another big component for her sx. Reviewed that OTC meds should be taken on the recommended dose, not more.   -- she used ambien in the past, but I prefer to work on sleep hygiene and re-train her to sleep on her own, given concern for side effects associated with long term use of sleep inducers  -- Regular physical activity encouraged  -- Likely moving out of the basement will also help improve her overall mood. She's had the plan to return to [place] for several ys, and as the date is closer, this is also likely causing some anxiety.  -- msg sent to Care Partner to assist with sleep hygiene  Plan: CBC, PLATELET & DIFFERENTIAL, THYROID SCREEN         TSH REFLEX FT4, COMPREHENSIVE METABOLIC PANEL,         VITAMIN D,25 HYDROXY, HEMOGLOBIN A1C, VITAMIN         B12        The patient was ready to  learn and no apparent learning or adherence barriers were identified. I explained the diagnosis and treatment plan, and the patient expressed understanding of the content. I attempted to answer any questions regarding the diagnosis and the proposed treatment.    We discussed the patient's current medications.  We discussed the importance of medication compliance. The patient expressed understanding and no barriers to adherence were identified.    follow-up per lab results  she has been advised to call or return with any worsening or new problems    I spent a total of 39 minutes on this visit on the date of service (total time includes all activities performed on the date of service)

## 2023-06-11 NOTE — Progress Notes (Signed)
Pls call pt to review the following results:    Her CBC is essentially normal, no signs of anemia or infection  Her vit D is low. I'm sending her a rx for weekly supplementation. Take once a week for a total of 16 weeks. (This is the SUN vitamin, so it's common to have it on the low side. Some people may feel more tired when it's low. I don't think it explains all of her fatigue, but it may be contributing)    Her diabetes test shows that she's in a prediabetic range. To help regulate her blood sugars, I want her to start taking a medication for diabetes, to see if we can curve this down and control it first. It's Metformin 500 mg, and she can take twice a day. Some people experience a little GI upset when they start. IF she experiences this, take once a day for 7-10 days, then increased to BID.     The other tests, Thyroid and B12, are normal  Her liver is fine, and her kidneys are fine.   Her electrolytes are normal.    Also, make sure she's taking her multivitamins as well.   And, the rx for Omeprazole for her husb is ready for pick up at the pharmacy    Let me know if she has any questions!    Thank you!  Florestine Avers, APRN, 06/11/2023

## 2023-06-12 ENCOUNTER — Telehealth (HOSPITAL_BASED_OUTPATIENT_CLINIC_OR_DEPARTMENT_OTHER): Payer: Self-pay | Admitting: Family Medicine

## 2023-06-12 ENCOUNTER — Telehealth (HOSPITAL_BASED_OUTPATIENT_CLINIC_OR_DEPARTMENT_OTHER): Payer: Self-pay

## 2023-06-12 NOTE — Telephone Encounter (Signed)
-----   Message from [facility] sent at 06/11/2023  6:11 PM EDT -----  Pls call pt to review the following results:    Her CBC is essentially normal, no signs of anemia or infection  Her vit D is low. I'm sending her a rx for weekly supplementation. Take once a week for a total of 16 weeks. (This is the SUN vitamin, so it's common to have it on the low side. Some people may feel more tired when it's low. I don't think it explains all of her fatigue, but it may be contributing)    Her diabetes test shows that she's in a prediabetic range. To help regulate her blood sugars, I want her to start taking a medication for diabetes, to see if we can curve this down and control it first. It's Metformin 500 mg, and she can take twice a day. Some people experience a little GI upset when they start. IF she experiences this, take once a day for 7-10 days, then increased to BID.     The other tests, Thyroid and B12, are normal  Her liver is fine, and her kidneys are fine.   Her electrolytes are normal.    Also, make sure she's taking her multivitamins as well.   And, the rx for Omeprazole for her husb is ready for pick up at the pharmacy    Let me know if she has any questions!    Thank you!  Florestine Avers, APRN, 06/11/2023

## 2023-06-12 NOTE — Telephone Encounter (Signed)
Called patient with interpreter [phone] unable to reach left message to call clinic.

## 2023-06-12 NOTE — Telephone Encounter (Addendum)
1st Attempt      This team member called the Patient tofollow-up  sleep problem .    On behalf of [employer], Iriana Dahm, APRN      Patient (self) did not answered, this team member left a message asking for a call back # [phone] or [phone]. Pt can leave a message best time to call Pt back.     Pt has a Clinic and Care partner phone number in case pt wants to reach out.     Pt will call your primary care clinic with any further questions or concerns.      In the case of a life threatening emergency, pt will present to the emergency room or call 911.      Morene Crocker, 06/12/2023

## 2023-06-13 NOTE — Telephone Encounter (Signed)
Called with interpreter [phone] confirmed name and DOB. Reviewed providers message. Agrees to start medication. Reviewed instructions.

## 2023-06-15 ENCOUNTER — Telehealth (HOSPITAL_BASED_OUTPATIENT_CLINIC_OR_DEPARTMENT_OTHER): Payer: Self-pay

## 2023-06-15 NOTE — Telephone Encounter (Signed)
Care Partner Note    Type of encounter:  Initial Telephone Encounter    Patient was referred to care partner through:  Inbasket message from PCP or PA    Reason for encounter: Sleep problem    Assessment:   Female 67 years old. Pt lives with her husband. Pt has 2 daughters and 3 grandchildren. Pt is not working. Pt stays home and doesn't have a hobby. Pt is packing, she decided to move back to [place] after 22 year in the [place].  Pt was having difficulty going to sleep and was taking a few medication lake melatonin. Pt stopped last week taking  the medication and was making her more awake. pt knows try not to worry anymore if she does not sleep she will do other thinking until she goes to sleep, some night is better the other. I share some sleep hygiene tips.Pt worry her sister found a make on her skin they removed and it is cancer. Pt has one similar she would like a referral for a dermatologist, pt knows PCP is out this week she will answer after she is coming back. I sent a PCP message to decide next steps. I shared some resources with pt BH app [name] ansiedade and sent a message for her to fix her mychart. pt will ask her daughter to morrow to help her. I will set her other resources. pt will continue self care. I'll follow up with her in 2 weeks.     Appears to be a Step 2 patient on the Stepped Model of Care    Patient Active Problem List:     History of bariatric surgery     LATERAL FOOT PAIN     Benign hypertensive heart disease without heart failure     Alkaline phosphatase elevation     Calculus of kidney     Gastritis     Allergic rhinitis due to other allergen     Osteoporosis     Murmur heart     Family history of throat cancer     Vitamin D deficiency     Subclavian artery stenosis, left (HCC)     Schatzki's ring     Duodenal ulcer, unspecified as acute or chronic, without hemorrhage, perforation, or obstruction     Mild depression     Glaucoma suspect of both eyes     Hyperopia with presbyopia of both  eyes     Abnormal CT lung screening     Elevated LFTs     COVID-19 virus infection     SBO (small bowel obstruction) (HCC)     Validated blood pressure cuff for home monitoring      Moderate major depression (HCC)      Medication: Vitamin D, Ergocalciferol, 1.25 MG (50000 UT) capsule, Take 1 capsule by mouth once a week  for 16 doses, Disp: 16 capsule, Rfl: 0  metFORMIN (GLUCOPHAGE) 500 MG tablet, Take 1 tablet by mouth in the morning and 1 tablet in the evening. Take with meals., Disp: 60 tablet, Rfl: 5  amLODIPine (NORVASC) 5 MG tablet, Take 1 tablet by mouth in the morning., Disp: 90 tablet, Rfl: 3  chlorthalidone (HYGROTEN) 25 MG tablet, Take 1 tablet by mouth in the morning. (STOP HCTZ)., Disp: 30 tablet, Rfl: 5  losartan (COZAAR) 100 MG tablet, Take 1 tablet by mouth in the morning., Disp: 90 tablet, Rfl: 1  famotidine (PEPCID) 40 MG tablet, TAKE 1 TABLET BY MOUTH IN THE MORNING AND 1 TABLET  BEFORE BEDTIME., Disp: 60 tablet, Rfl: 5  cholecalciferol (VITAMIN D3) 2000 UNIT TABS tablet, Take 1 tablet by mouth in the morning., Disp: 30 tablet, Rfl: 11  acetaminophen (TYLENOL) 500 MG tablet, Take 1,000 mg by mouth every 6 (six) hours as needed for Pain, Disp: , Rfl:     No current facility-administered medications on file prior to visit.      Relevant screening scores:  AWQ:   PHQ-9:       06/11/2023    10:01 AM 09/29/2022    10:14 AM 02/13/2018    11:09 AM   PHQ-9 TOTAL SCORE   Doc FlowSheet Total Score 13 12 7    MyChart Total Score  12      GAD-7:        No data to display              Child/Adolescent:    How difficult have these problems made it for you to do your work, take care of things at home, or get along with people?  Somewhat difficult     Engagement/motivation for change (Reference)    Preparation    Primary brief Intervention provided during this encounter:  Psychoeducation, Emotional Support, Self Care Planning, Provided Resources, and Care Coordination Other Mychart    PLAN:   Fix her mychart     Work  on physical, pleasant activities, stress release( relaxation technique) and BH app  In [place] "[place] [employer]".     Pt has a Clinic and Care partner phone number in case pt wants to reach out.    Pt will call your primary care clinic with any further questions or concerns.      In the case of a life threatening emergency, pt will present to the emergency room or call 911.       TIME SPENT: 30-40 minutes     Mariah [name], 06/15/2023

## 2023-06-19 ENCOUNTER — Telehealth (HOSPITAL_BASED_OUTPATIENT_CLINIC_OR_DEPARTMENT_OTHER): Payer: Self-pay

## 2023-06-19 NOTE — Telephone Encounter (Signed)
Care Partner Note    Type of encounter:  Follow-up Telephone Encounter    Patient was referred to care partner through:  Inbasket message from PCP or PA    Reason for encounter: Sleep problem    Assessment:   Pt being very busy packing to move next week. Pt didn't have time to fix her mychart or check the resources. Pt will continue self care. I'll follow up with her in 2 weeks.     Appears to be a Step 1 patient on the Stepped Model of Care    Patient Active Problem List:     History of bariatric surgery     LATERAL FOOT PAIN     Benign hypertensive heart disease without heart failure     Alkaline phosphatase elevation     Calculus of kidney     Gastritis     Allergic rhinitis due to other allergen     Osteoporosis     Murmur heart     Family history of throat cancer     Vitamin D deficiency     Subclavian artery stenosis, left (HCC)     Schatzki's ring     Duodenal ulcer, unspecified as acute or chronic, without hemorrhage, perforation, or obstruction     Mild depression     Glaucoma suspect of both eyes     Hyperopia with presbyopia of both eyes     Abnormal CT lung screening     Elevated LFTs     COVID-19 virus infection     SBO (small bowel obstruction) (HCC)     Validated blood pressure cuff for home monitoring      Moderate major depression (HCC)      Medication: Vitamin D, Ergocalciferol, 1.25 MG (50000 UT) capsule, Take 1 capsule by mouth once a week  for 16 doses, Disp: 16 capsule, Rfl: 0  metFORMIN (GLUCOPHAGE) 500 MG tablet, Take 1 tablet by mouth in the morning and 1 tablet in the evening. Take with meals., Disp: 60 tablet, Rfl: 5  amLODIPine (NORVASC) 5 MG tablet, Take 1 tablet by mouth in the morning., Disp: 90 tablet, Rfl: 3  chlorthalidone (HYGROTEN) 25 MG tablet, Take 1 tablet by mouth in the morning. (STOP HCTZ)., Disp: 30 tablet, Rfl: 5  losartan (COZAAR) 100 MG tablet, Take 1 tablet by mouth in the morning., Disp: 90 tablet, Rfl: 1  famotidine (PEPCID) 40 MG tablet, TAKE 1 TABLET BY MOUTH IN THE  MORNING AND 1 TABLET BEFORE BEDTIME., Disp: 60 tablet, Rfl: 5  cholecalciferol (VITAMIN D3) 2000 UNIT TABS tablet, Take 1 tablet by mouth in the morning., Disp: 30 tablet, Rfl: 11  acetaminophen (TYLENOL) 500 MG tablet, Take 1,000 mg by mouth every 6 (six) hours as needed for Pain, Disp: , Rfl:     No current facility-administered medications on file prior to visit.      Relevant screening scores:  AWQ:   PHQ-9:       06/11/2023    10:01 AM 09/29/2022    10:14 AM 02/13/2018    11:09 AM   PHQ-9 TOTAL SCORE   Doc FlowSheet Total Score 13 12 7    MyChart Total Score  12      GAD-7:        No data to display              Child/Adolescent:    How difficult have these problems made it for you to do your work, take care of things at home,  or get along with people?  Somewhat difficult     Engagement/motivation for change (Reference)    Preparation    Primary brief Intervention provided during this encounter:  Emotional Support and Self Care Planning    PLAN:   Fix her mychart     Continue work on physical, pleasant activities, stress release( relaxation technique) and BH app  In [place] "[place] [employer]".     Pt has a Clinic and Care partner phone number in case pt wants to reach out.    Pt will call your primary care clinic with any further questions or concerns.      In the case of a life threatening emergency, pt will present to the emergency room or call 911.    TIME SPENT: 5-10 minutes     Morene Crocker, 06/19/2023

## 2023-07-06 ENCOUNTER — Telehealth (HOSPITAL_BASED_OUTPATIENT_CLINIC_OR_DEPARTMENT_OTHER): Payer: Self-pay

## 2023-07-06 NOTE — Telephone Encounter (Signed)
Unable to speak in private  This team member called the Patient tofollow-up sleep problem.    On behalf of [employer], Lakela Kuba, APRN      Patient (self) answered, she was busy asked CP to call her back next week. I'll follow up with her next week.  Pt has a Clinic and Care partner phone number in case pt wants to reach out.     Pt will call your primary care clinic with any further questions or concerns.      In the case of a life threatening emergency, pt will present to the emergency room or call 911.      Morene Crocker, 07/06/2023

## 2023-07-12 ENCOUNTER — Telehealth (HOSPITAL_BASED_OUTPATIENT_CLINIC_OR_DEPARTMENT_OTHER): Payer: Self-pay

## 2023-07-12 NOTE — Telephone Encounter (Signed)
Care Partner Note    Type of encounter:  Follow-up Telephone Encounter    Patient was referred to care partner through:  Inbasket message from PCP or PA    Reason for encounter: Sleep problem     Assessment:     Pt is doing better, she  moved last week, she is living with her daughter in [place]. Pt decided to stay with her daughter until she moved back to [place]. Pt is planning to return to [place] in a few months. Pt continue to walk everyday. Pt is sleeping better. She doesn't need medication to help her sleep.Pt will asked for a new mychart code, I sent to her. Pt will ask her daughter to help her fix her mychart. Pt will continue self care and she will call if she needs it.    Appears to be a Step 1 patient on the Stepped Model of Care    Patient Active Problem List:     History of bariatric surgery     LATERAL FOOT PAIN     Benign hypertensive heart disease without heart failure     Alkaline phosphatase elevation     Calculus of kidney     Gastritis     Allergic rhinitis due to other allergen     Osteoporosis     Murmur heart     Family history of throat cancer     Vitamin D deficiency     Subclavian artery stenosis, left (HCC)     Schatzki's ring     Duodenal ulcer, unspecified as acute or chronic, without hemorrhage, perforation, or obstruction     Mild depression     Glaucoma suspect of both eyes     Hyperopia with presbyopia of both eyes     Abnormal CT lung screening     Elevated LFTs     COVID-19 virus infection     SBO (small bowel obstruction) (HCC)     Validated blood pressure cuff for home monitoring      Moderate major depression (HCC)      Medication: Vitamin D, Ergocalciferol, 1.25 MG (50000 UT) capsule, Take 1 capsule by mouth once a week  for 16 doses, Disp: 16 capsule, Rfl: 0  metFORMIN (GLUCOPHAGE) 500 MG tablet, Take 1 tablet by mouth in the morning and 1 tablet in the evening. Take with meals., Disp: 60 tablet, Rfl: 5  amLODIPine (NORVASC) 5 MG tablet, Take 1 tablet by mouth in the morning.,  Disp: 90 tablet, Rfl: 3  chlorthalidone (HYGROTEN) 25 MG tablet, Take 1 tablet by mouth in the morning. (STOP HCTZ)., Disp: 30 tablet, Rfl: 5  losartan (COZAAR) 100 MG tablet, Take 1 tablet by mouth in the morning., Disp: 90 tablet, Rfl: 1  famotidine (PEPCID) 40 MG tablet, TAKE 1 TABLET BY MOUTH IN THE MORNING AND 1 TABLET BEFORE BEDTIME., Disp: 60 tablet, Rfl: 5  cholecalciferol (VITAMIN D3) 2000 UNIT TABS tablet, Take 1 tablet by mouth in the morning., Disp: 30 tablet, Rfl: 11  acetaminophen (TYLENOL) 500 MG tablet, Take 1,000 mg by mouth every 6 (six) hours as needed for Pain, Disp: , Rfl:     No current facility-administered medications on file prior to visit.      Relevant screening scores:  AWQ:   PHQ-9:       06/11/2023    10:01 AM 09/29/2022    10:14 AM 02/13/2018    11:09 AM   PHQ-9 TOTAL SCORE   Doc FlowSheet Total Score 13 12  7   MyChart Total Score  12      GAD-7:        No data to display              Child/Adolescent:    How difficult have these problems made it for you to do your work, take care of things at home, or get along with people?  Not at all difficult     Engagement/motivation for change (Reference)    Action    Primary brief Intervention provided during this encounter:  Emotional Support, Self Care Planning, and Care Coordination Other adress    PLAN:     Pt will ask her daughter to help her fix her mychart     Continue work on physical, pleasant activities, stress release( relaxation technique) and BH app  In [place] "[place] [employer]".     Pt has a Clinic and Care partner phone number in case pt wants to reach out.    Pt will call your primary care clinic with any further questions or concerns.      In the case of a life threatening emergency, pt will present to the emergency room or call 911.    TIME SPENT: 20-30 minutes     Morene Crocker, 07/12/2023

## 2023-07-20 ENCOUNTER — Ambulatory Visit (HOSPITAL_BASED_OUTPATIENT_CLINIC_OR_DEPARTMENT_OTHER): Payer: Self-pay | Admitting: Registered Nurse

## 2023-07-20 NOTE — Telephone Encounter (Signed)
Reason for Disposition   MODERATE pain (e.g., symptoms interfere with work or school, limping) and present > 3 days    Answer Assessment - Initial Assessment Questions  Pt with chronic left knee pain now pain 8:10  Hurts to walk  Swelling behind left knee  No numbness or tingling no redness or drainage  No groin or calf pain  Patient asking to be seen in person  No fever  No cp or sob  No recent traumas or falls    Protocols used: Knee Pain-A-OH  Advised per nursing triage protocol.  Verbalized understanding and agreement with instructions and disposition.     Recommended disposition for patient:Disposition: See in Office within 3 days    If patient referred to UC/ED advised that they may require further follow up and testing after the visit with their primary care office.     Instructed patient to call back for any new, worsening, or worrisome symptoms or concerns any time day or night.

## 2023-07-25 ENCOUNTER — Other Ambulatory Visit: Payer: Self-pay

## 2023-07-25 ENCOUNTER — Encounter (HOSPITAL_BASED_OUTPATIENT_CLINIC_OR_DEPARTMENT_OTHER): Payer: Self-pay | Admitting: Geriatric Medicine

## 2023-07-25 ENCOUNTER — Ambulatory Visit (HOSPITAL_BASED_OUTPATIENT_CLINIC_OR_DEPARTMENT_OTHER)
Admit: 2023-07-25 | Discharge: 2023-07-25 | Disposition: A | Discharge status: Home / Self Care | Payer: No Typology Code available for payment source

## 2023-07-25 ENCOUNTER — Ambulatory Visit (HOSPITAL_BASED_OUTPATIENT_CLINIC_OR_DEPARTMENT_OTHER): Payer: No Typology Code available for payment source | Admitting: Geriatric Medicine

## 2023-07-25 ENCOUNTER — Ambulatory Visit
Admission: RE | Admit: 2023-07-25 | Discharge: 2023-07-25 | Disposition: A | Discharge status: Home / Self Care | Payer: No Typology Code available for payment source | Attending: Diagnostic Radiology | Admitting: Diagnostic Radiology

## 2023-07-25 VITALS — BP 138/84 | HR 67 | Temp 98.4°F

## 2023-07-25 DIAGNOSIS — I119 Hypertensive heart disease without heart failure: Secondary | ICD-10-CM | POA: Insufficient documentation

## 2023-07-25 DIAGNOSIS — M76891 Other specified enthesopathies of right lower limb, excluding foot: Secondary | ICD-10-CM | POA: Diagnosis present

## 2023-07-25 DIAGNOSIS — Z8719 Personal history of other diseases of the digestive system: Secondary | ICD-10-CM | POA: Insufficient documentation

## 2023-07-25 DIAGNOSIS — K2950 Unspecified chronic gastritis without bleeding: Secondary | ICD-10-CM | POA: Insufficient documentation

## 2023-07-25 DIAGNOSIS — M170 Bilateral primary osteoarthritis of knee: Secondary | ICD-10-CM | POA: Diagnosis present

## 2023-07-25 DIAGNOSIS — M76892 Other specified enthesopathies of left lower limb, excluding foot: Secondary | ICD-10-CM | POA: Diagnosis present

## 2023-07-25 DIAGNOSIS — M810 Age-related osteoporosis without current pathological fracture: Secondary | ICD-10-CM | POA: Diagnosis not present

## 2023-07-25 MED ORDER — CAMPHOR-MENTHOL-METHYL SAL 3-5-15 % EX CREA
TOPICAL_CREAM | CUTANEOUS | 11 refills | Status: AC
Start: 2023-07-25 — End: 2024-07-24

## 2023-07-25 NOTE — Progress Notes (Signed)
[facility] INTERNAL MEDICINE       S:  Mariah [name] is a 67 year old female, primary care patient of Jezabel, Shaheed, APRN seen today at [facility] for chief complaint of bilateral knee pain which she characterizes as "insupportable"    Arthritis  Presents for initial visit. The disease course has been worsening. The condition has lasted for 3 months. She complains of pain and stiffness. She reports no joint swelling or joint warmth. Affected locations include the right knee and left knee. Associated symptoms include pain while resting. (+ crepitus, + sensation of joint instability and fear that knee will give out (R>L)) Treatments tried: diclofenac PO and APAP PO. The treatment provided mild relief. Factors aggravating her arthritis include descending stairs and climbing stairs (Note that patient Moved to house with stairs, is staying in a basement apartment at her daughter's place).         Review of Systems   Musculoskeletal:  Positive for arthritis and stiffness. Negative for joint swelling.       O:  Vitals:  BP 138/84 (Site: LA, Position: Sitting, Cuff Size: Lrg)   Pulse 67   Temp 98.4 F (36.9 C) (Oral)   SpO2 98%     Physical Exam:  Physical Exam  Constitutional:       Appearance: She is obese.      Comments: Affect cheery and engaged   Musculoskeletal:      Right knee: Deformity, bony tenderness and crepitus present. No swelling or effusion. Normal range of motion.      Left knee: Deformity and bony tenderness present. No swelling or effusion. Normal range of motion.      Comments: Bony tenderness on light palparation at medial aspect of right knee, lateral aspect of left knee      Gait mildly antalgic           Labs:         Latest Reference Range & Units 06/11/23 09:52   SODIUM 136 - 145 mmol/L 140   POTASSIUM 3.5 - 5.1 mmol/L 4.4   CHLORIDE 98 - 107 mmol/L 102   CARBON DIOXIDE 21 - 32 mmol/L 25   ANION GAP 10 - 22 mmol/L 13   CALCIUM 8.5 - 10.5 mg/dL 9.8    Glucose Random 74 - 160 mg/dL 85   BUN (UREA NITROGEN) 7 - 18 mg/dL 13   CREATININE 0.4 - 1.2 mg/dL 0.7   ESTIMATED GLOMERULAR FILT RATE >60 ML/MIN > 60   TOTAL PROTEIN 6.4 - 8.2 g/dL 7.9   ALBUMIN 3.4 - 5.2 g/dL 4.6   BILIRUBIN TOTAL 0.2 - 1.0 mg/dL 0.3   ALKALINE PHOSPHATASE 45 - 117 U/L 195 (H)   ASPARTATE AMINOTRANSFERASE 8 - 34 U/L 33   ALANINE AMINOTRANSFERASE 12 - 45 U/L 38   HEMOGLOBIN A1C 4.0 - 5.6 % 6.3 (H)   THYROID SCREEN TSH REFLEX FT4 0.270 - 4.200 uIU/mL 1.500   VITAMIN B12 232 - 1245 pg/mL 558   VITAMIN D,25 HYDROXY 30.0 - 100.0 ng/mL 25 (L)         Imaging:     BONE DENSITOMETRY REPORT      EXAMS:   LS&HIP BONE DENSITOMETRY SCAN        PRIMARY CARE PHYSICIAN:  Blane Ohara, M.D.      CLINICAL INDICATION:  Hyperparathyroidism.      FINDINGS:  The bone mineral density of the lumbar spine measures 0.767  g/cm2, -2.5 standard deviations below peak bone mass (T-score) and -1.7   standard deviations below age-matched controls (Z-score).  Bone mineral   density of the left forearm (one-third radius) measures 0.572 g/cm2, T-score    -2.0 and Z-score -1.3, at the left femoral neck measures 0.714 g/cm2,   T-score -1.2 and Z-score -0.4, and at the total left hip measures 0.932   g/cm2, T-score -0.1 and Z-score +0.4.  When compared with the studies done   on 05/28/2006, and given the calculated least significant change of 0.040   g/cm2, there has been no significant change in bone mineral density   measurement at the spine or the forearm but there has been a 7.2% increase   in bone mineral density measurement at the total left hip.      IMPRESSION:  Osteoporosis is present, and clinical correlation is required.     Bone mineral density has increased at the hip but not changed   significantly at the spine or forearm when compared with the study done in   05/2006.      ===============================================================      Criteria for the diagnosis of osteoporosis in postmenopausal women and in    men 59 years of age and older:    Normal:  BMD is within 1 SD of young adult value, the peak bone mass.    Osteopenia (low bone density): BMD is between 1 - 2.5 SD below young adult   value. Osteoporosis:  BMD is 2.5 SD or greater below young adult value.      The risk of fractures increases progressively as bone mineral density (BMD)    declines. For each standard deviation (SD) below normal (T-score), the risk    of fracture almost doubles. Recommendations for treatment of osteoporosis   are better understood in this context.  Early intervention has been shown to    diminish the probability of fracture.    In females prior to menopause and in males younger than age 20, Z-scores,   not T-scores, are preferred.  A Z-score of -2.0 or lower is below the   expected range for age.    The Z-score allows a physician to compare this individual to her/his age   group, and often flags an underlying medical condition that has led to more   severe bone loss than anticipated by age alone.  A normal Z-score does not   diminish the significance of the reported bone loss.    Bone densitometry may underestimate the degree of bone loss in the presence    of sclerotic changes to bone, such as osteoarthritis, prior compression   fracture or excessive calcification.  Please take this into account in   interpreting this report.      Fracture risk is a composite risk based on age, mineralization, anatomy,   structural integrity of bone and applied stresses.  [facility]   at [facility] offers consultation services to help the physician    determine the best treatment for an individual at risk for osteoporosis or   with established osteoporosis.  Please call [phone] to schedule   appointments or to ask any questions regarding the test itself or its   interpretation.      ___________________________    Reviewed and Electronically Signed By: Durene Fruits MD    Sig Date: 01/14/2008    Sig Time: 21:13:57     Dictated By: Durene Fruits MD    Dict Date: 01/08/2008 Dict Time:  08 43 PM       Medications:  Current Outpatient Medications   Medication Instructions    acetaminophen (TYLENOL) 1,000 mg, Oral, EVERY 6 HOURS PRN    amLODIPine (NORVASC) 5 mg, Oral, DAILY    chlorthalidone (HYGROTEN) 25 mg, Oral, DAILY, (STOP HCTZ)    cholecalciferol (VITAMIN D3) 2,000 Units, Oral, DAILY    famotidine (PEPCID) 40 MG tablet TAKE 1 TABLET BY MOUTH IN THE MORNING AND 1 TABLET BEFORE BEDTIME.    losartan (COZAAR) 100 mg, Oral, DAILY    metFORMIN (GLUCOPHAGE) 500 mg, Oral, 2 TIMES DAILY WITH MEALS    Vitamin D (Ergocalciferol) 50,000 Units, Oral, WEEKLY       Allergies:  Review of Patient's Allergies indicates:   Lisinopril              Cough   Heparin                 Rash    Comment:local rash seen during hospitalization Feb 2008             Received lovenox during hospitalization Oct 2021             w/o issue      A/P:  Mariah [name] was seen today for knee pain and leg pain.    Diagnoses and all orders for this visit:    Bilateral primary osteoarthritis of knee  Other chronic gastritis without hemorrhage  History of duodenal ulcer  Patient has major osteophytic deformities on all aspects of both knees with tenderness and crepitus without heat, redness or effusion    Unfortunately would like to avoid  oral NSAID in patient with history  of PUD AND gastritis, albeit remote.    Plan for bilateral bracing for Mariah [name]'s perceiptions of joint  instability, high fall risk (on stairs daily now)      -     REFERRAL TO PHYSICAL THERAPY (INT)    -     Camphor-Menthol-Methyl Sal 02-19-14 % CREA; Massage deeply into both knees 2x daily for knee arthritis   acetaminophen (TYLENOL) 1,000 mg, Oral, EVERY 6 HOURS PRN       -     XR KNEE RIGHT 3 VIEWS; Future   -     XR KNEE LEFT 3 VIEWS; Future      Age-related osteoporosis without current pathological fracture  Homero Fellers osteoporosis was found on 2009 DEXA scan, thought to be parathyroid-mediated. She  had  parathyroidectomy in 2008.Marland Kitchen On my review of serial labs, patient has normalized parathyroid hormone level. It is unclear whether patient ever had bone building therapy. Needs repeat DEXA to risk stratify    -     XR DXA BONE DENSITOMETRY; Future      Vitamin D (Ergocalciferol) 50,000 Units, Oral, WEEKLY         Benign hypertensive heart disease without heart failure     Normotensive and euvolemic on current therapy.      Continue    amLODIPine (NORVASC) 5 mg, Oral, DAILY    chlorthalidone (HYGROTEN) 25 mg, Oral, DAILY, (STOP HCTZ)    cholecalciferol (VITAMIN D3) 2,000 Units, Oral, DAILY    losartan (COZAAR) 100 mg, Oral, DAILY     Metformin is on board for insulin resistance        IMPORTANT:     Patient does NOT have subclavian stenosis    Per CT angio 2013:   There is also some calcification at      the origin of  the left subclavian artery at the arch but no narrowing      is demonstrated.      Removed from [facility] problem list.          I spent a total of 40 minutes on this visit on the date of service (total time includes all activities performed on the date of service)  Rossie Muskrat, MD, 07/25/2023

## 2023-07-25 NOTE — Patient Instructions (Signed)
[  ]    Siga com o Tylenol    [  ]  Comece o gel analgsico que mandei para a farmcia    [  ]  Programe a fisioterapia com [facility] [phone]    [  ]  Compre duas talas de joelho e use quando est a p e caminhando    [  ]  V a [facility] para radiogramas dos dois joelhos    [  ] Ligue [phone] para prova de densidade ossea

## 2023-08-08 ENCOUNTER — Other Ambulatory Visit: Payer: Self-pay | Admitting: Internal Medicine

## 2023-08-21 ENCOUNTER — Inpatient Hospital Stay (HOSPITAL_BASED_OUTPATIENT_CLINIC_OR_DEPARTMENT_OTHER): Admission: RE | Admit: 2023-08-21 | Payer: No Typology Code available for payment source | Source: Ambulatory Visit

## 2023-09-21 ENCOUNTER — Encounter (HOSPITAL_BASED_OUTPATIENT_CLINIC_OR_DEPARTMENT_OTHER): Payer: No Typology Code available for payment source | Admitting: Internal Medicine

## 2023-09-25 ENCOUNTER — Ambulatory Visit (HOSPITAL_BASED_OUTPATIENT_CLINIC_OR_DEPARTMENT_OTHER): Payer: No Typology Code available for payment source | Admitting: Rehabilitative and Restorative Service Providers"

## 2023-09-25 DIAGNOSIS — M170 Bilateral primary osteoarthritis of knee: Secondary | ICD-10-CM | POA: Insufficient documentation

## 2023-09-25 NOTE — Progress Notes (Deleted)
OUTPATIENT EVALUATION    Referring Provider: Sheria Lang, MD     Precautions: ***    SUBJECTIVE  Hx of Present Illness: Pt is a 67 year old female who presents to physical therapy with a physician diagnosis of Bilateral primary osteoarthritis of knee [M17.0]  Pt reports ***.    Onset Date: ***  Onset Code:  ICD-10 Code: Bilateral primary osteoarthritis of knee [M17.0]      Imaging: XR Knee Right 3 views  Status: Final result     PACS Images     Show images for XR Knee Right 3 views  Study Result    Narrative & Impression   Exam: Right knee, 3 views     Indication: Knee severe osteophytic deformity bilaterally. Bilateral knee primary osteoarthritis.     Comparison: Right knee radiographs 07/09/2018     Findings:     Bones and Joints: Moderate medial tibiofemoral joint space narrowing has progressed on the nonweightbearing images with tricompartmental osteophytes. There is no acute fracture or significant joint effusion. A distal quadriceps enthesophyte is noted at the superior patella.     Soft Tissues: There is mild subcutaneous edema.     IMPRESSION:      1. Progression of moderate right knee osteoarthritis.     2. Distal quadriceps enthesophyte.        Reviewed and Electronically Signed By: Judd Lien, MD  Signed Date and Time: 07/25/2023 3:11 PM     XR Knee Left 3 views  Status: Final result     PACS Images     Show images for XR Knee Left 3 views  Study Result    Narrative & Impression   Exam: Left knee, 3 views     Indication:, Bilateral knee primary osteoarthritis, bilateral osteophytic deformity and pain.     Comparison: Left knee radiographs 03/09/2015     Findings:     Bones and Joints: Moderate medial tibiofemoral joint space narrowing has progressed on these nonweightbearing images with tricompartmental osteophytes. There is trace joint fluid. There is no acute fracture.     Enthesophytes are noted at the superior and inferior patella.      Soft Tissues: Unremarkable.       IMPRESSION:      1.  Progression of moderate left knee osteoarthritis.     2. Quadriceps and patellar tendon enthesophytes.        Reviewed and Electronically Signed By: Judd Lien, MD  Signed Date and Time: 07/25/2023 3:05 PM         SUBJECTIVE    Prior Level of Function: I    Pain Level: ***/10    Dominant Extremity: ***    IADL'S/Work: {REHAB_GENERAL LIST:12845::"WNL"}    Dressing/Grooming: {REHAB_GENERAL LIST:12845::"WNL"}    Driving/sitting tolerance: {REHAB_GENERAL LIST:12845::"WNL"}    Sleeping: {REHAB_GENERAL LIST:12845::"WNL"}    Aggravating Factors: ***    Alleviating Factors: ***    Past Medical History: see medical record.    Medications: (Rx Comments, concerns): For a list of current medications review the Medication activity.   Mental Status/Communication: OTHER: Moderate depressive d/o.    Learns Best: practice, demonstration.      Objective:  ***    FUNCTIONAL DEFICITS: ***  FUNCTIONAL OUTCOME MEASURES:       Physical Therapy Plan of Care    MW:UXLKG, Atilano Median, APRN  Referring Provider: Sheria Lang, MD   Diagnosis: Bilateral primary osteoarthritis of knee [M17.0]    Assessment/Objective Findings: Patient is a 67 year old female  with complaints of pain in both knees.  The following PT problems were noted upon evaluation: ***. These problems limit the patient with the following functional activities: ***. This patient will benefit from skilled PT plan of care. The prescribed treatment plan of care is medically necessary secondary to Bilateral primary osteoarthritis of knee [M17.0].  Co-morbidities of Patient Active Problem List:     History of bariatric surgery     LATERAL FOOT PAIN     Benign hypertensive heart disease without heart failure     Alkaline phosphatase elevation     Calculus of kidney     Gastritis     Allergic rhinitis due to other allergen     Osteoporosis     Murmur heart     Family history of throat cancer     Vitamin D deficiency     Schatzki's ring     History of duodenal ulcer     Mild  depression     Glaucoma suspect of both eyes     Hyperopia with presbyopia of both eyes     Abnormal CT lung screening     Elevated LFTs     COVID-19 virus infection     History of small bowel obstruction     Validated blood pressure cuff for home monitoring      Moderate major depression (HCC)     Bilateral primary osteoarthritis of knee   were identified and taken into considerations of plan of care.  In my professional opinion this patient requires skilled physical therapy to address the concerns of the functional limitations outlined above.    Short Term Functional Goals: {[facility] AMB PT WEEKS FOR SHORT TERM ZO:109604540}   Pt will demonstrate independence and compliance with HEP in *** weeks  Pt will demonstrate improved ROM of *** by *** in *** weeks   Pt will demonstrate improved postural awareness by sitting with biomechanically correct posture > *** minutes to improve overall postural function in *** weeks    Pt will demonstrate improved SLS of *** to *** in *** weeks   Pt will demonstrate improved mobility of *** to *** in *** weeks   Pt will demonstrate improved strength of *** by *** in *** weeks   Pt will demonstrate improved length of *** to *** in *** weeks  Pt will demonstrate improved gait *** by *** in *** weeks   Pt will demonstrate improved core strength *** by *** in *** weeks     Pt will demonstrate improved squat mechanics of *** by *** in *** weeks    Pt will demonstrate improved *** of *** by *** in *** weeks    Pt will demonstrate improved *** of *** by *** in *** weeks      Long Term Goal: {[facility] AMB PT WEEKS FOR LONG TERM:13834}   Pt will demonstrate ***  Pt will demonstrate ***    Treatment Plan: {OT/PT TREATMENT PLAN:12724}    Recommend Physical Therapy be continued {NUMBERS 1-6:6} times per week for {NUMBERS 1-12:10} weeks.  The rehabilitation potential for this patient is {EXCELLENT/GOOD/FAIR/POOR:12734}  Based on comprehensive examination and evaluation, this patient meets criteria for ***  complexity due to combined factor(s) of *** presentation of *** body segment(s).    [name] is aware of attendance policy: Yes  Plan of care discussed with Patient/Family: Yes  Patient goals reviewed and incorporated in plan of care: Yes  Patient/Family agrees with plan of care: Yes  Patient/Family education: Yes  Does patient feel safe at home:  Yes      Myer Haff, PT, DPT, HFS [phone]          Myer Haff, PT, Lic # [id number]

## 2023-10-02 ENCOUNTER — Other Ambulatory Visit (HOSPITAL_BASED_OUTPATIENT_CLINIC_OR_DEPARTMENT_OTHER): Payer: Self-pay | Admitting: Internal Medicine

## 2023-10-02 DIAGNOSIS — E559 Vitamin D deficiency, unspecified: Secondary | ICD-10-CM

## 2023-10-02 DIAGNOSIS — I119 Hypertensive heart disease without heart failure: Secondary | ICD-10-CM

## 2023-10-02 NOTE — Telephone Encounter (Signed)
PER Pharmacy, Mariah [name] is a 67 year old female has requested a refill of 3 pended orders.      Last OFFICE/TELE Visit:  07/25/23 with Arlean Hopping      Last Physical Exam:   12/27/2018     There are no preventive care reminders to display for this patient.    Other Med Adult:  Most Recent BP Reading(s)  07/25/23 : 138/84        Cholesterol (mg/dL)   Date Value   33/29/5188 190     LOW DENSITY LIPOPROTEIN DIRECT (mg/dL)   Date Value   41/66/0630 116     HIGH DENSITY LIPOPROTEIN (mg/dL)   Date Value   16/12/930 56     TRIGLYCERIDES (mg/dL)   Date Value   35/57/3220 128         THYROID SCREEN TSH REFLEX FT4 (uIU/mL)   Date Value   06/11/2023 1.500         TSH (THYROID STIM HORMONE) (uIU/mL)   Date Value   04/03/2012 1.68       HEMOGLOBIN A1C (%)   Date Value   06/11/2023 6.3 (H)       No results found for: "POCA1C"      INR (no units)   Date Value   04/28/2014 < 1.0   01/18/2007 1.0 (L)       SODIUM (mmol/L)   Date Value   06/11/2023 140       POTASSIUM (mmol/L)   Date Value   06/11/2023 4.4           CREATININE (mg/dL)   Date Value   25/42/7062 0.7       Documented patient preferred pharmacies:    [facility], [place] - [address]  Phone: [phone] Fax: [phone]

## 2023-10-03 MED ORDER — CHOLECALCIFEROL 50 MCG (2000 UT) PO TABS
2000.00 [IU] | ORAL_TABLET | Freq: Every day | ORAL | 3 refills | Status: AC
Start: 2023-10-03 — End: 2024-10-02

## 2023-10-03 MED ORDER — LOSARTAN POTASSIUM 100 MG PO TABS
100.00 mg | ORAL_TABLET | Freq: Every day | ORAL | 3 refills | Status: AC
Start: 2023-10-03 — End: 2024-09-27

## 2023-10-03 MED ORDER — CHLORTHALIDONE 25 MG PO TABS
25.00 mg | ORAL_TABLET | Freq: Every day | ORAL | 3 refills | Status: AC
Start: 2023-10-03 — End: 2024-09-27

## 2023-11-07 ENCOUNTER — Encounter (HOSPITAL_BASED_OUTPATIENT_CLINIC_OR_DEPARTMENT_OTHER): Payer: Self-pay | Admitting: Internal Medicine

## 2023-11-07 DIAGNOSIS — Z0289 Encounter for other administrative examinations: Secondary | ICD-10-CM | POA: Insufficient documentation

## 2023-11-07 NOTE — Progress Notes (Signed)
I was notified by pt's dtr that pt returned to [place].   Uncheck from [facility], APRN, 11/07/2023

## 2024-03-13 ENCOUNTER — Other Ambulatory Visit: Payer: Self-pay | Admitting: Internal Medicine

## 2024-07-29 IMAGING — MR HIDRO-RM ( COLANGIO - RM)
15 of 21 series · 31 of 48 positions shown · IV contrast (YES)
Comparison: none

[Series 3: T2 · axial · 6.0mm · 0.78mm/px · 1 of 36 slices shown (1 of 3)]
[im 1/36]
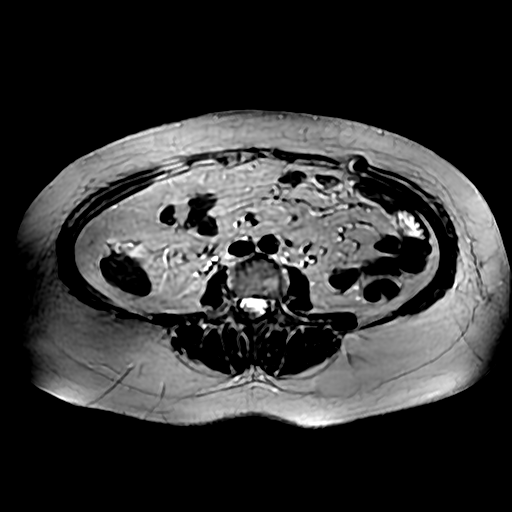

[Series 4: DWI · axial · 6.0mm · 1.56mm/px · 1 of 108 slices shown]
[im 1/108]
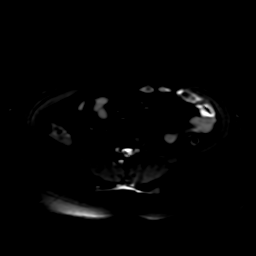

[Series 6: T2 · coronal · 6.0mm · 0.79mm/px · 1 of 30 slices shown (2 of 3)]
[im 1/30]
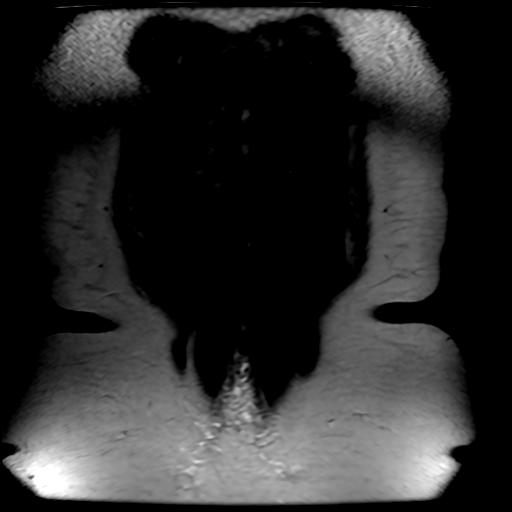

[Series 7: T2 · axial · 3.0mm · 0.78mm/px · 1 of 36 slices shown (3 of 3)]
[im 1/36]
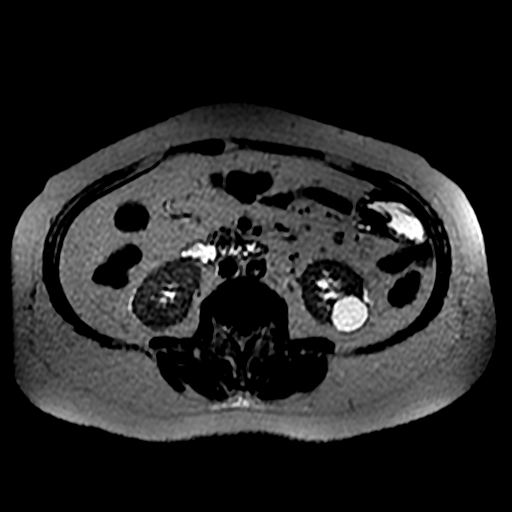

[Series 8: colangio 2d radial · sagittal · 40.0mm · 0.63mm/px · 1 of 12 slices shown]
[im 1/12]
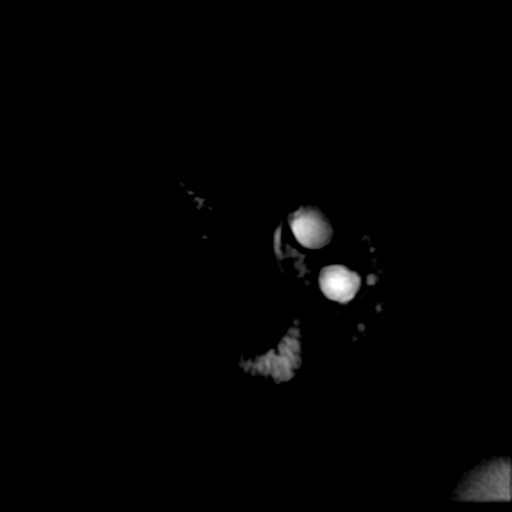

[Series 9: bSSFP fat-sat · axial · 5.0mm · 0.78mm/px · 1 of 44 slices shown]
[im 1/44]
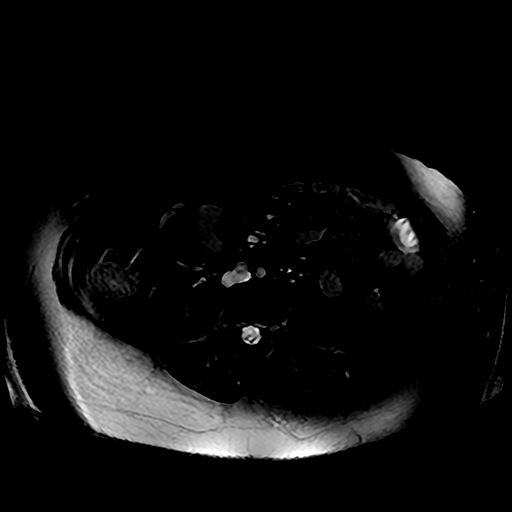

[Series 450: ADC · axial · 6.0mm · 1.56mm/px · 1 of 36 slices shown]
[im 1/36]
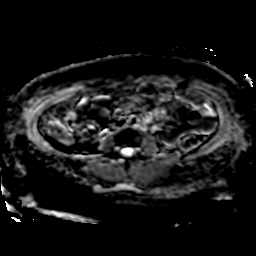

[Series 1001: T1 dynamic · axial · 4.0mm · 1.04mm/px · z∈[-19,+251]mm · 3 of 136 slices shown (1 of 8)]
[im 1/136]
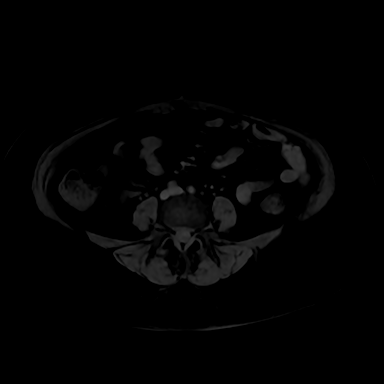
[im 68/136]
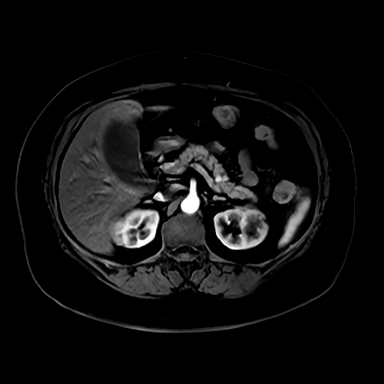
[im 136/136]
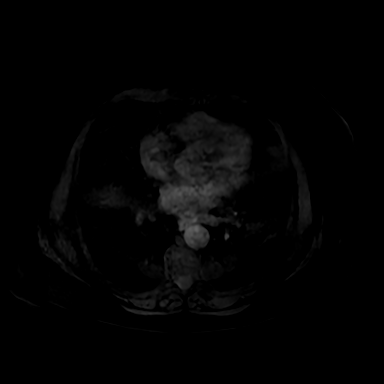

[Series 1002: T1 dynamic · axial · 4.0mm · 1.04mm/px · z∈[-19,+251]mm · 3 of 136 slices shown (2 of 8)]
[im 1/136]
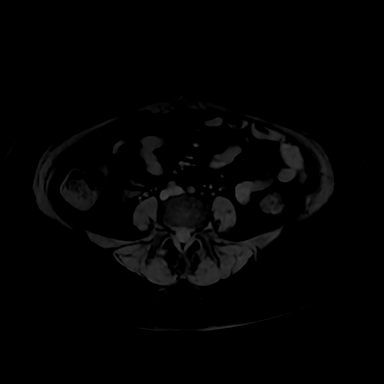
[im 68/136]
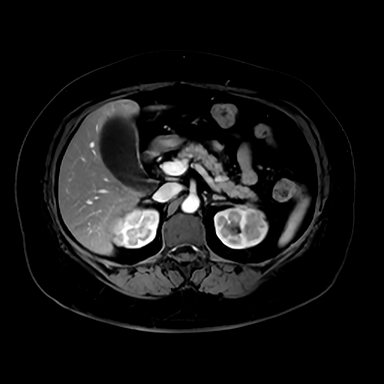
[im 136/136]
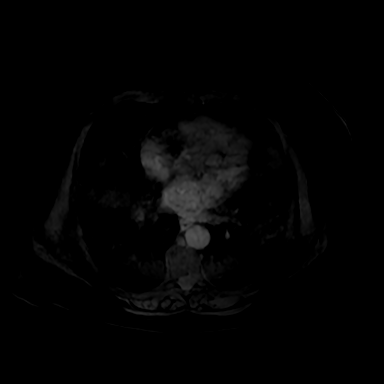

[Series 1003: T1 dynamic · axial · 4.0mm · 1.04mm/px · z∈[-19,+251]mm · 3 of 136 slices shown (3 of 8)]
[im 1/136]
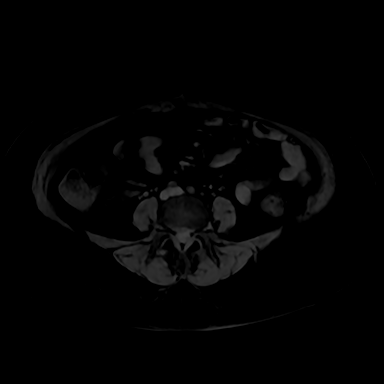
[im 68/136]
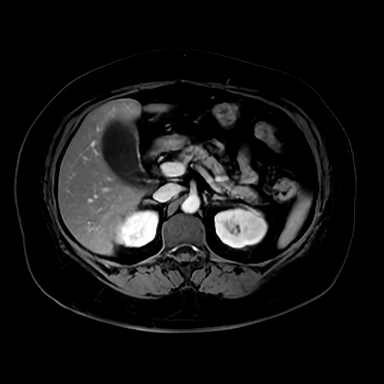
[im 136/136]
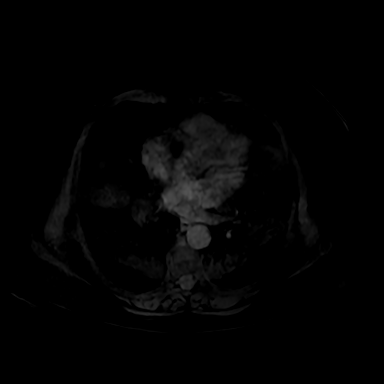

[Series 1051: T1 dynamic · axial · 4.0mm · 1.04mm/px · z∈[-19,+251]mm · 3 of 136 slices shown (4 of 8)]
[im 1/136]
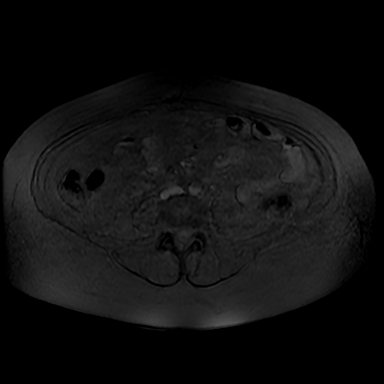
[im 68/136]
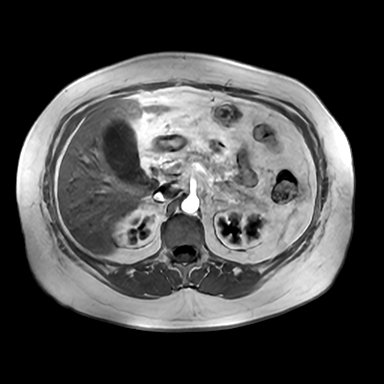
[im 136/136]
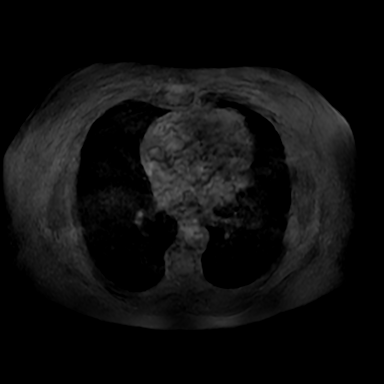

[Series 1052: T1 dynamic · axial · 4.0mm · 1.04mm/px · z∈[-19,+251]mm · 3 of 136 slices shown (5 of 8)]
[im 1/136]
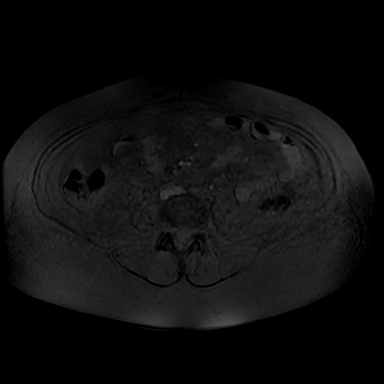
[im 68/136]
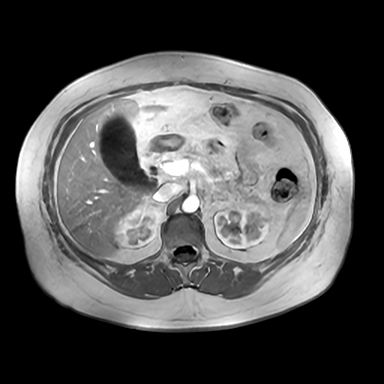
[im 136/136]
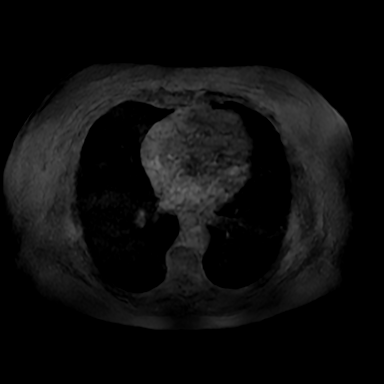

[Series 1053: T1 dynamic · axial · 4.0mm · 1.04mm/px · z∈[-19,+251]mm · 3 of 136 slices shown (6 of 8)]
[im 1/136]
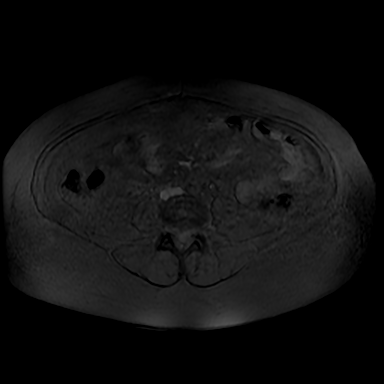
[im 68/136]
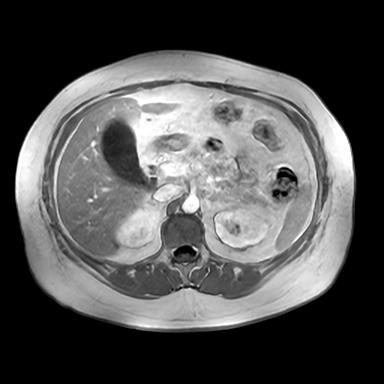
[im 136/136]
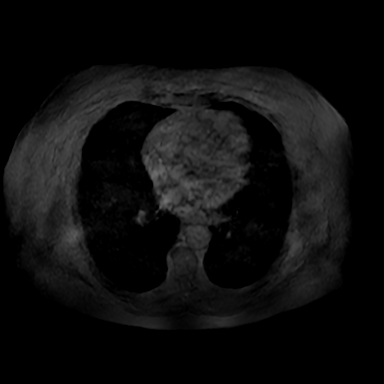

[Series 1076: T1 dynamic · axial · 4.0mm · 1.04mm/px · z∈[-19,+251]mm · 3 of 136 slices shown (7 of 8)]
[im 1/136]
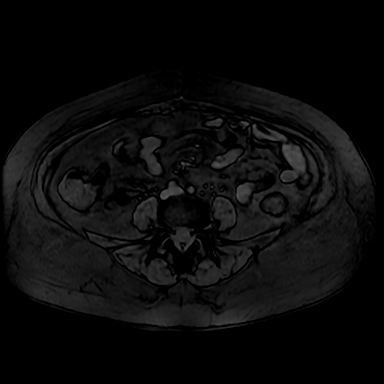
[im 68/136]
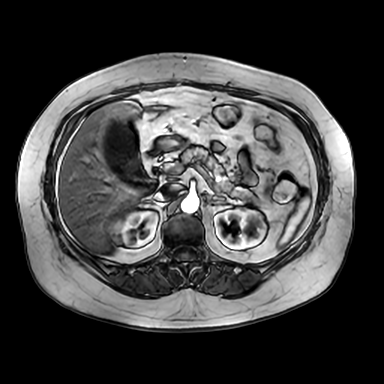
[im 136/136]
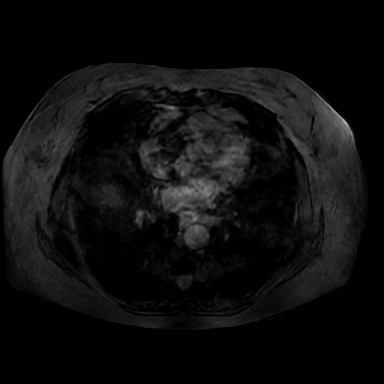

[Series 1077: T1 dynamic · axial · 4.0mm · 1.04mm/px · z∈[-19,+251]mm · 3 of 136 slices shown (8 of 8)]
[im 1/136]
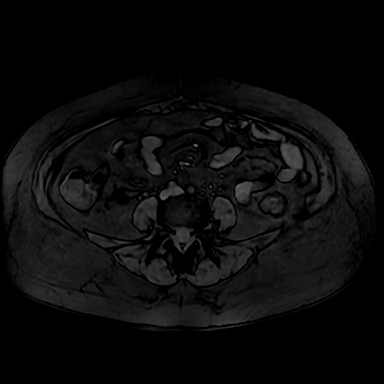
[im 68/136]
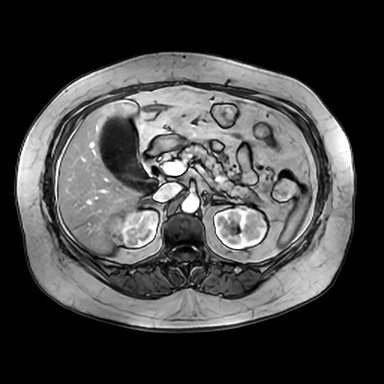
[im 136/136]
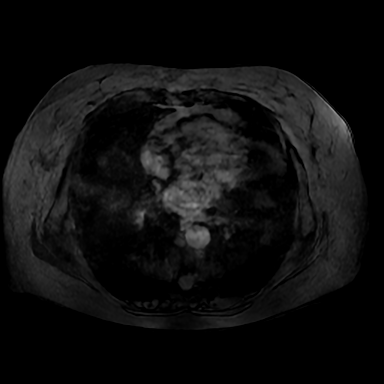

[31 of 48 positions shown; findings below may reference images not displayed]

Técnica:
Exame realizado pelas técnicas TSE e GRE com imagens multiplanares ponderadas em T1 e T2, antes e após a
administração intravenosa de meio de contraste paramagnético (gadolínio). Também foram realizadas seqüências
fortemente ponderadas em T2 para estudo da árvore ductal biliopancreática.
Relatório:
Vesícula biliar sobredistendida, porém com paredes ﬁnas e sem evidências de imagens calculosas ao método.
Hepatocolédoco com calibre preservado (0,5 cm).
COLANGIORRESSONÂNCIA MAGNÉTICA
Ducto cístico de aspecto usual.
Junção bilio pancreática preservada.
Ausência de dilatação das vias biliares intra- e extra-hepáticas.
Pâncreas com dimensões, forma e sinal preservados. Não há dilatação do ducto pancreático principal. Planos adiposos
peripancreáticos sem anormalidades.
Não há linfonodomegalias ou líquido livre na cavidade abdominal superior.
Impressão:
Achados adicionais:
Vesícula biliar sobredistendida, porém com paredes ﬁnas e sem evidências de imagens calculosas ao método.
Cistos corticais renais simples bilateralmente. Cistos peripiélicos à esquerda.
Gastroplastia redutora com bypass.

## 2024-10-08 ENCOUNTER — Other Ambulatory Visit (HOSPITAL_BASED_OUTPATIENT_CLINIC_OR_DEPARTMENT_OTHER): Payer: Self-pay | Admitting: Internal Medicine

## 2024-10-08 DIAGNOSIS — I119 Hypertensive heart disease without heart failure: Secondary | ICD-10-CM

## 2024-10-09 NOTE — Telephone Encounter (Signed)
 PER who is calling: Pharmacy, Mariah [name] is a 68 year old female has requested a refill of amlodipine .    This refill request failed protocol because     Calcium -Channel Blockers Protocol Failed10/22/2025 01:19 PM   Protocol Details Visit with relevant provider in past 12 months        PCP team Provider appointment needed in the next 90 days for refill of amlodipine . CR Please call patient to schedule, 90 day refill has been approved.                 Last Office Visit: 07/25/23 with [name]  Last Physical Exam: 12/27/2018    There are no preventive care reminders to display for this patient.    Other Med Adult:  Most Recent BP Reading(s)  07/25/23 : 138/84        Cholesterol (mg/dL)   Date Value   97/75/7978 190     LOW DENSITY LIPOPROTEIN DIRECT (mg/dL)   Date Value   97/75/7978 116     HIGH DENSITY LIPOPROTEIN (mg/dL)   Date Value   97/75/7978 56     TRIGLYCERIDES (mg/dL)   Date Value   97/75/7978 128         THYROID  SCREEN TSH REFLEX FT4 (uIU/mL)   Date Value   06/11/2023 1.500         TSH (THYROID  STIM HORMONE) (uIU/mL)   Date Value   04/03/2012 1.68       HEMOGLOBIN A1C (%)   Date Value   06/11/2023 6.3 (H)       No results found for: POCA1C      INR (no units)   Date Value   04/28/2014 < 1.0   01/18/2007 1.0 (L)       SODIUM (mmol/L)   Date Value   06/11/2023 140       POTASSIUM (mmol/L)   Date Value   06/11/2023 4.4           CREATININE (mg/dL)   Date Value   93/75/7975 0.7       Documented patient preferred pharmacies:    [facility], [place] - [address]  Phone: [phone] Fax: [phone]

## 2024-10-10 ENCOUNTER — Telehealth (HOSPITAL_BASED_OUTPATIENT_CLINIC_OR_DEPARTMENT_OTHER): Payer: Self-pay | Admitting: Internal Medicine

## 2024-10-10 NOTE — Telephone Encounter (Signed)
 1ST attempt to schedule provider visit.     Unable to reach pt. LVM to call back & schedule

## 2024-10-13 ENCOUNTER — Telehealth (HOSPITAL_BASED_OUTPATIENT_CLINIC_OR_DEPARTMENT_OTHER): Payer: Self-pay | Admitting: Internal Medicine

## 2024-10-13 NOTE — Telephone Encounter (Signed)
 2ND ATTEMPT- unable to reach pt or leave voicemail

## 2024-10-15 ENCOUNTER — Telehealth (HOSPITAL_BASED_OUTPATIENT_CLINIC_OR_DEPARTMENT_OTHER): Payer: Self-pay | Admitting: Internal Medicine

## 2024-10-15 NOTE — Telephone Encounter (Signed)
 Hello,     Failed refill protocol for amlodipine  medication, a 3 month supply of medication has been sent to the pharmacy. Central refill made three unsuccessful attempts to schedule appt.     Please contact patient to schedule office visit  appt to get further refills.

## 2024-10-16 ENCOUNTER — Encounter (HOSPITAL_BASED_OUTPATIENT_CLINIC_OR_DEPARTMENT_OTHER): Payer: Self-pay | Admitting: Internal Medicine

## 2024-10-16 NOTE — Telephone Encounter (Signed)
 [facility] Outreach  Call made to Hadassah JAYSON Shipper to schedule an appointment Fail prescription refill, need appt .SABRA PACIFIC present as interpreter in Portuguese.   WHO ARE YOU CALLING: Patient (self) did not answer at [phone] ; Unable to leave a VM, and not accepting call at the moment..  I have completed the following communication and reminder steps: Letter sent to patient  Patient notified by telephone  Rudolph Rogue, 10/16/2024    On behalf of PCP: Heinz Hadassah SQUIBB, APRN
